# Patient Record
Sex: Male | Born: 1965
Health system: Southern US, Community
[De-identification: ages and names within clinical notes are randomized; demographics above are authoritative.]

---

## 2019-04-14 ENCOUNTER — Inpatient Hospital Stay (HOSPITAL_COMMUNITY)
Admission: EM | Admit: 2019-04-14 | Discharge: 2019-06-25 | DRG: 023 | Disposition: A | Discharge status: Home / Self Care | Payer: Medicaid - Out of State | Attending: Neurology | Admitting: Neurology

## 2019-04-14 ENCOUNTER — Other Ambulatory Visit: Payer: Self-pay

## 2019-04-14 ENCOUNTER — Inpatient Hospital Stay (HOSPITAL_COMMUNITY): Payer: Medicaid - Out of State

## 2019-04-14 ENCOUNTER — Inpatient Hospital Stay (HOSPITAL_COMMUNITY): Payer: Medicaid - Out of State | Admitting: Certified Registered"

## 2019-04-14 ENCOUNTER — Encounter (HOSPITAL_COMMUNITY)
Admission: EM | Disposition: A | Discharge status: Home / Self Care | Payer: Self-pay | Source: Home / Self Care | Attending: Neurology

## 2019-04-14 ENCOUNTER — Emergency Department (HOSPITAL_COMMUNITY): Payer: Medicaid - Out of State

## 2019-04-14 ENCOUNTER — Encounter (HOSPITAL_COMMUNITY): Payer: Self-pay | Admitting: Neurology

## 2019-04-14 DIAGNOSIS — Z20828 Contact with and (suspected) exposure to other viral communicable diseases: Secondary | ICD-10-CM | POA: Diagnosis present

## 2019-04-14 DIAGNOSIS — R29810 Facial weakness: Secondary | ICD-10-CM | POA: Diagnosis present

## 2019-04-14 DIAGNOSIS — E46 Unspecified protein-calorie malnutrition: Secondary | ICD-10-CM | POA: Diagnosis not present

## 2019-04-14 DIAGNOSIS — R471 Dysarthria and anarthria: Secondary | ICD-10-CM | POA: Diagnosis present

## 2019-04-14 DIAGNOSIS — I651 Occlusion and stenosis of basilar artery: Secondary | ICD-10-CM | POA: Diagnosis present

## 2019-04-14 DIAGNOSIS — D6869 Other thrombophilia: Secondary | ICD-10-CM | POA: Diagnosis not present

## 2019-04-14 DIAGNOSIS — E11649 Type 2 diabetes mellitus with hypoglycemia without coma: Secondary | ICD-10-CM | POA: Diagnosis not present

## 2019-04-14 DIAGNOSIS — R29733 NIHSS score 33: Secondary | ICD-10-CM | POA: Diagnosis not present

## 2019-04-14 DIAGNOSIS — F321 Major depressive disorder, single episode, moderate: Secondary | ICD-10-CM | POA: Diagnosis not present

## 2019-04-14 DIAGNOSIS — I639 Cerebral infarction, unspecified: Secondary | ICD-10-CM | POA: Diagnosis not present

## 2019-04-14 DIAGNOSIS — I69391 Dysphagia following cerebral infarction: Secondary | ICD-10-CM

## 2019-04-14 DIAGNOSIS — I63432 Cerebral infarction due to embolism of left posterior cerebral artery: Principal | ICD-10-CM | POA: Diagnosis present

## 2019-04-14 DIAGNOSIS — D509 Iron deficiency anemia, unspecified: Secondary | ICD-10-CM | POA: Diagnosis not present

## 2019-04-14 DIAGNOSIS — E785 Hyperlipidemia, unspecified: Secondary | ICD-10-CM | POA: Diagnosis present

## 2019-04-14 DIAGNOSIS — R4701 Aphasia: Secondary | ICD-10-CM

## 2019-04-14 DIAGNOSIS — R29713 NIHSS score 13: Secondary | ICD-10-CM | POA: Diagnosis present

## 2019-04-14 DIAGNOSIS — R449 Unspecified symptoms and signs involving general sensations and perceptions: Secondary | ICD-10-CM | POA: Diagnosis present

## 2019-04-14 DIAGNOSIS — H499 Unspecified paralytic strabismus: Secondary | ICD-10-CM | POA: Diagnosis present

## 2019-04-14 DIAGNOSIS — E119 Type 2 diabetes mellitus without complications: Secondary | ICD-10-CM

## 2019-04-14 DIAGNOSIS — Z751 Person awaiting admission to adequate facility elsewhere: Secondary | ICD-10-CM

## 2019-04-14 DIAGNOSIS — G8191 Hemiplegia, unspecified affecting right dominant side: Secondary | ICD-10-CM | POA: Diagnosis present

## 2019-04-14 DIAGNOSIS — J9601 Acute respiratory failure with hypoxia: Secondary | ICD-10-CM

## 2019-04-14 DIAGNOSIS — H532 Diplopia: Secondary | ICD-10-CM | POA: Diagnosis present

## 2019-04-14 DIAGNOSIS — R569 Unspecified convulsions: Secondary | ICD-10-CM | POA: Diagnosis not present

## 2019-04-14 DIAGNOSIS — H5500 Unspecified nystagmus: Secondary | ICD-10-CM | POA: Diagnosis present

## 2019-04-14 DIAGNOSIS — H5340 Unspecified visual field defects: Secondary | ICD-10-CM | POA: Diagnosis present

## 2019-04-14 DIAGNOSIS — B952 Enterococcus as the cause of diseases classified elsewhere: Secondary | ICD-10-CM | POA: Diagnosis not present

## 2019-04-14 DIAGNOSIS — R739 Hyperglycemia, unspecified: Secondary | ICD-10-CM

## 2019-04-14 DIAGNOSIS — R633 Feeding difficulties: Secondary | ICD-10-CM

## 2019-04-14 DIAGNOSIS — Z4659 Encounter for fitting and adjustment of other gastrointestinal appliance and device: Secondary | ICD-10-CM

## 2019-04-14 DIAGNOSIS — R6339 Other feeding difficulties: Secondary | ICD-10-CM

## 2019-04-14 DIAGNOSIS — I10 Essential (primary) hypertension: Secondary | ICD-10-CM | POA: Diagnosis present

## 2019-04-14 DIAGNOSIS — R143 Flatulence: Secondary | ICD-10-CM

## 2019-04-14 DIAGNOSIS — N390 Urinary tract infection, site not specified: Secondary | ICD-10-CM | POA: Diagnosis not present

## 2019-04-14 DIAGNOSIS — I6622 Occlusion and stenosis of left posterior cerebral artery: Secondary | ICD-10-CM | POA: Diagnosis present

## 2019-04-14 DIAGNOSIS — E1165 Type 2 diabetes mellitus with hyperglycemia: Secondary | ICD-10-CM | POA: Diagnosis present

## 2019-04-14 DIAGNOSIS — B957 Other staphylococcus as the cause of diseases classified elsewhere: Secondary | ICD-10-CM | POA: Diagnosis not present

## 2019-04-14 DIAGNOSIS — Z8249 Family history of ischemic heart disease and other diseases of the circulatory system: Secondary | ICD-10-CM

## 2019-04-14 DIAGNOSIS — R200 Anesthesia of skin: Secondary | ICD-10-CM | POA: Diagnosis present

## 2019-04-14 DIAGNOSIS — G9340 Encephalopathy, unspecified: Secondary | ICD-10-CM

## 2019-04-14 DIAGNOSIS — G9349 Other encephalopathy: Secondary | ICD-10-CM | POA: Diagnosis present

## 2019-04-14 DIAGNOSIS — R1310 Dysphagia, unspecified: Secondary | ICD-10-CM | POA: Diagnosis present

## 2019-04-14 DIAGNOSIS — R509 Fever, unspecified: Secondary | ICD-10-CM

## 2019-04-14 DIAGNOSIS — Z9289 Personal history of other medical treatment: Secondary | ICD-10-CM

## 2019-04-14 DIAGNOSIS — G529 Cranial nerve disorder, unspecified: Secondary | ICD-10-CM | POA: Diagnosis present

## 2019-04-14 HISTORY — PX: IR ANGIO EXTRACRAN SEL COM CAROTID INNOMINATE UNI R MOD SED: IMG5356

## 2019-04-14 HISTORY — PX: IR ANGIO INTRA EXTRACRAN SEL COM CAROTID INNOMINATE UNI L MOD SED: IMG5358

## 2019-04-14 HISTORY — PX: IR CT HEAD LTD: IMG2386

## 2019-04-14 HISTORY — PX: IR PERCUTANEOUS ART THROMBECTOMY/INFUSION INTRACRANIAL INC DIAG ANGIO: IMG6087

## 2019-04-14 HISTORY — PX: IR ANGIO VERTEBRAL SEL VERTEBRAL UNI L MOD SED: IMG5367

## 2019-04-14 HISTORY — PX: RADIOLOGY WITH ANESTHESIA: SHX6223

## 2019-04-14 LAB — CBC
HCT: 40.7 % (ref 39.0–52.0)
Hemoglobin: 12.7 g/dL — ABNORMAL LOW (ref 13.0–17.0)
MCH: 22.8 pg — ABNORMAL LOW (ref 26.0–34.0)
MCHC: 31.2 g/dL (ref 30.0–36.0)
MCV: 72.9 fL — ABNORMAL LOW (ref 80.0–100.0)
Platelets: 249 10*3/uL (ref 150–400)
RBC: 5.58 MIL/uL (ref 4.22–5.81)
RDW: 14.1 % (ref 11.5–15.5)
WBC: 6.4 10*3/uL (ref 4.0–10.5)
nRBC: 0 % (ref 0.0–0.2)

## 2019-04-14 LAB — COMPREHENSIVE METABOLIC PANEL
ALT: 23 U/L (ref 0–44)
AST: 23 U/L (ref 15–41)
Albumin: 3.5 g/dL (ref 3.5–5.0)
Alkaline Phosphatase: 63 U/L (ref 38–126)
Anion gap: 13 (ref 5–15)
BUN: 11 mg/dL (ref 6–20)
CO2: 23 mmol/L (ref 22–32)
Calcium: 9.2 mg/dL (ref 8.9–10.3)
Chloride: 98 mmol/L (ref 98–111)
Creatinine, Ser: 0.92 mg/dL (ref 0.61–1.24)
GFR calc Af Amer: >60 mL/min (ref >60)
GFR calc non Af Amer: >60 mL/min (ref >60)
Glucose, Bld: 342 mg/dL — ABNORMAL HIGH (ref 70–99)
Potassium: 3.7 mmol/L (ref 3.5–5.1)
Sodium: 134 mmol/L — ABNORMAL LOW (ref 135–145)
Total Bilirubin: 0.8 mg/dL (ref 0.3–1.2)
Total Protein: 6.9 g/dL (ref 6.5–8.1)

## 2019-04-14 LAB — POCT I-STAT 7, (LYTES, BLD GAS, ICA,H+H)
Acid-base deficit: 4 mmol/L — ABNORMAL HIGH (ref 0.0–2.0)
Bicarbonate: 21.3 mmol/L (ref 20.0–28.0)
Calcium, Ion: 1.18 mmol/L (ref 1.15–1.40)
HCT: 38 % — ABNORMAL LOW (ref 39.0–52.0)
Hemoglobin: 12.9 g/dL — ABNORMAL LOW (ref 13.0–17.0)
O2 Saturation: 98 %
Patient temperature: 98.6
Potassium: 3.7 mmol/L (ref 3.5–5.1)
Sodium: 137 mmol/L (ref 135–145)
TCO2: 22 mmol/L (ref 22–32)
pCO2 arterial: 37.3 mmHg (ref 32.0–48.0)
pH, Arterial: 7.364 (ref 7.350–7.450)
pO2, Arterial: 114 mmHg — ABNORMAL HIGH (ref 83.0–108.0)

## 2019-04-14 LAB — I-STAT CHEM 8, ED
BUN: 12 mg/dL (ref 6–20)
Calcium, Ion: 1.11 mmol/L — ABNORMAL LOW (ref 1.15–1.40)
Chloride: 97 mmol/L — ABNORMAL LOW (ref 98–111)
Creatinine, Ser: 0.7 mg/dL (ref 0.61–1.24)
Glucose, Bld: 327 mg/dL — ABNORMAL HIGH (ref 70–99)
HCT: 43 % (ref 39.0–52.0)
Hemoglobin: 14.6 g/dL (ref 13.0–17.0)
Potassium: 3.7 mmol/L (ref 3.5–5.1)
Sodium: 135 mmol/L (ref 135–145)
TCO2: 21 mmol/L — ABNORMAL LOW (ref 22–32)

## 2019-04-14 LAB — DIFFERENTIAL
Abs Immature Granulocytes: 0.01 10*3/uL (ref 0.00–0.07)
Basophils Absolute: 0 10*3/uL (ref 0.0–0.1)
Basophils Relative: 1 %
Eosinophils Absolute: 0.1 10*3/uL (ref 0.0–0.5)
Eosinophils Relative: 1 %
Immature Granulocytes: 0 %
Lymphocytes Relative: 19 %
Lymphs Abs: 1.2 10*3/uL (ref 0.7–4.0)
Monocytes Absolute: 0.4 10*3/uL (ref 0.1–1.0)
Monocytes Relative: 6 %
Neutro Abs: 4.7 10*3/uL (ref 1.7–7.7)
Neutrophils Relative %: 73 %

## 2019-04-14 LAB — GLUCOSE, CAPILLARY
Glucose-Capillary: 143 mg/dL — ABNORMAL HIGH (ref 70–99)
Glucose-Capillary: 193 mg/dL — ABNORMAL HIGH (ref 70–99)
Glucose-Capillary: 205 mg/dL — ABNORMAL HIGH (ref 70–99)
Glucose-Capillary: 230 mg/dL — ABNORMAL HIGH (ref 70–99)
Glucose-Capillary: 244 mg/dL — ABNORMAL HIGH (ref 70–99)
Glucose-Capillary: 251 mg/dL — ABNORMAL HIGH (ref 70–99)
Glucose-Capillary: 268 mg/dL — ABNORMAL HIGH (ref 70–99)
Glucose-Capillary: 322 mg/dL — ABNORMAL HIGH (ref 70–99)

## 2019-04-14 LAB — CBG MONITORING, ED: Glucose-Capillary: 336 mg/dL — ABNORMAL HIGH (ref 70–99)

## 2019-04-14 LAB — APTT: aPTT: 27 seconds (ref 24–36)

## 2019-04-14 LAB — HEMOGLOBIN A1C
Hgb A1c MFr Bld: 12.3 % — ABNORMAL HIGH (ref 4.8–5.6)
Mean Plasma Glucose: 306.31 mg/dL

## 2019-04-14 LAB — PROTIME-INR
INR: 1 (ref 0.8–1.2)
Prothrombin Time: 13.3 seconds (ref 11.4–15.2)

## 2019-04-14 LAB — MRSA PCR SCREENING: MRSA by PCR: NEGATIVE

## 2019-04-14 LAB — SARS CORONAVIRUS 2 BY RT PCR (HOSPITAL ORDER, PERFORMED IN ~~LOC~~ HOSPITAL LAB): SARS Coronavirus 2: NEGATIVE

## 2019-04-14 SURGERY — IR WITH ANESTHESIA
Anesthesia: General

## 2019-04-14 MED ORDER — IOHEXOL 300 MG/ML  SOLN
150.0000 mL | Freq: Once | INTRAMUSCULAR | Status: AC | PRN
  Administered 2019-04-14: 13:00:00 80 mL via INTRA_ARTERIAL

## 2019-04-14 MED ORDER — FENTANYL CITRATE (PF) 100 MCG/2ML IJ SOLN
INTRAMUSCULAR | Status: AC
  Filled 2019-04-14: qty 2

## 2019-04-14 MED ORDER — SUCCINYLCHOLINE CHLORIDE 20 MG/ML IJ SOLN
INTRAMUSCULAR | Status: DC | PRN
  Administered 2019-04-14: 120 mg via INTRAVENOUS

## 2019-04-14 MED ORDER — SODIUM CHLORIDE 0.9 % IV SOLN
INTRAVENOUS | Status: DC | PRN
  Administered 2019-04-14: 500 mL via INTRAVENOUS

## 2019-04-14 MED ORDER — INSULIN REGULAR(HUMAN) IN NACL 100-0.9 UT/100ML-% IV SOLN
INTRAVENOUS | Status: DC
  Administered 2019-04-14: 17:00:00 2.6 [IU]/h via INTRAVENOUS
  Filled 2019-04-14: qty 100

## 2019-04-14 MED ORDER — ACETAMINOPHEN 325 MG PO TABS
650.0000 mg | ORAL_TABLET | ORAL | Status: DC | PRN
  Administered 2019-04-18 – 2019-05-26 (×5): 650 mg via ORAL
  Filled 2019-04-14 (×10): qty 2

## 2019-04-14 MED ORDER — INSULIN ASPART 100 UNIT/ML ~~LOC~~ SOLN: 0.0000 [IU] | SUBCUTANEOUS | Status: DC

## 2019-04-14 MED ORDER — FENTANYL CITRATE (PF) 100 MCG/2ML IJ SOLN: 50.0000 ug | INTRAMUSCULAR | Status: DC | PRN

## 2019-04-14 MED ORDER — ACETAMINOPHEN 650 MG RE SUPP
650.0000 mg | RECTAL | Status: DC | PRN
  Administered 2019-04-16 – 2019-04-18 (×4): 650 mg via RECTAL
  Filled 2019-04-14 (×4): qty 1

## 2019-04-14 MED ORDER — DEXAMETHASONE SODIUM PHOSPHATE 10 MG/ML IJ SOLN
INTRAMUSCULAR | Status: DC | PRN
  Administered 2019-04-14: 10 mg via INTRAVENOUS

## 2019-04-14 MED ORDER — CHLORHEXIDINE GLUCONATE 0.12% ORAL RINSE (MEDLINE KIT): 15.0000 mL | Freq: Two times a day (BID) | OROMUCOSAL | Status: DC

## 2019-04-14 MED ORDER — ACETAMINOPHEN 325 MG PO TABS: 650.0000 mg | ORAL_TABLET | ORAL | Status: DC | PRN

## 2019-04-14 MED ORDER — CHLORHEXIDINE GLUCONATE CLOTH 2 % EX PADS
6.0000 | MEDICATED_PAD | Freq: Every day | CUTANEOUS | Status: DC
  Administered 2019-04-15 – 2019-05-02 (×15): 6 via TOPICAL

## 2019-04-14 MED ORDER — NITROGLYCERIN 1 MG/10 ML FOR IR/CATH LAB
INTRA_ARTERIAL | Status: AC
  Filled 2019-04-14: qty 10

## 2019-04-14 MED ORDER — SODIUM CHLORIDE 0.9% FLUSH: 3.0000 mL | Freq: Once | INTRAVENOUS | Status: AC

## 2019-04-14 MED ORDER — LIDOCAINE HCL (CARDIAC) PF 100 MG/5ML IV SOSY
PREFILLED_SYRINGE | INTRAVENOUS | Status: DC | PRN
  Administered 2019-04-14: 60 mg via INTRAVENOUS

## 2019-04-14 MED ORDER — PROPOFOL 500 MG/50ML IV EMUL
INTRAVENOUS | Status: DC | PRN
  Administered 2019-04-14: 50 ug/kg/min via INTRAVENOUS

## 2019-04-14 MED ORDER — CLEVIDIPINE BUTYRATE 0.5 MG/ML IV EMUL
INTRAVENOUS | Status: DC | PRN
  Administered 2019-04-14: 4 mg/h via INTRAVENOUS

## 2019-04-14 MED ORDER — ACETAMINOPHEN 160 MG/5ML PO SOLN
650.0000 mg | ORAL | Status: DC | PRN
  Administered 2019-04-15: 650 mg
  Filled 2019-04-14: qty 20.3

## 2019-04-14 MED ORDER — EPTIFIBATIDE 20 MG/10ML IV SOLN
INTRAVENOUS | Status: AC
  Filled 2019-04-14: qty 10

## 2019-04-14 MED ORDER — ORAL CARE MOUTH RINSE
15.0000 mL | OROMUCOSAL | Status: DC
  Administered 2019-04-14 (×2): 15 mL via OROMUCOSAL

## 2019-04-14 MED ORDER — ASPIRIN 81 MG PO CHEW
CHEWABLE_TABLET | ORAL | Status: AC
  Filled 2019-04-14: qty 1

## 2019-04-14 MED ORDER — FENTANYL CITRATE (PF) 100 MCG/2ML IJ SOLN
INTRAMUSCULAR | Status: DC | PRN
  Administered 2019-04-14: 100 ug via INTRAVENOUS

## 2019-04-14 MED ORDER — ONDANSETRON HCL 4 MG/2ML IJ SOLN
INTRAMUSCULAR | Status: DC | PRN
  Administered 2019-04-14: 4 mg via INTRAVENOUS

## 2019-04-14 MED ORDER — IOHEXOL 300 MG/ML  SOLN: 150.0000 mL | Freq: Once | INTRAMUSCULAR | Status: DC | PRN

## 2019-04-14 MED ORDER — STROKE: EARLY STAGES OF RECOVERY BOOK
Freq: Once | Status: AC
  Administered 2019-04-14: 17:00:00
  Filled 2019-04-14: qty 1

## 2019-04-14 MED ORDER — SODIUM CHLORIDE 0.9 % IV SOLN
INTRAVENOUS | Status: DC
  Administered 2019-04-14 – 2019-05-08 (×23): via INTRAVENOUS

## 2019-04-14 MED ORDER — SENNOSIDES-DOCUSATE SODIUM 8.6-50 MG PO TABS: 1.0000 | ORAL_TABLET | Freq: Every evening | ORAL | Status: DC | PRN

## 2019-04-14 MED ORDER — ACETAMINOPHEN 650 MG RE SUPP: 650.0000 mg | RECTAL | Status: DC | PRN

## 2019-04-14 MED ORDER — CHLORHEXIDINE GLUCONATE CLOTH 2 % EX PADS
6.0000 | MEDICATED_PAD | Freq: Every day | CUTANEOUS | Status: DC
  Administered 2019-04-14: 6 via TOPICAL

## 2019-04-14 MED ORDER — INSULIN ASPART 100 UNIT/ML ~~LOC~~ SOLN: 2.0000 [IU] | SUBCUTANEOUS | Status: DC

## 2019-04-14 MED ORDER — CEFAZOLIN SODIUM-DEXTROSE 2-3 GM-%(50ML) IV SOLR
INTRAVENOUS | Status: DC | PRN
  Administered 2019-04-14: 2 g via INTRAVENOUS

## 2019-04-14 MED ORDER — PROPOFOL 1000 MG/100ML IV EMUL: 0.0000 ug/kg/min | INTRAVENOUS | Status: DC

## 2019-04-14 MED ORDER — ORAL CARE MOUTH RINSE
15.0000 mL | OROMUCOSAL | Status: DC
  Administered 2019-04-14 – 2019-04-16 (×16): 15 mL via OROMUCOSAL

## 2019-04-14 MED ORDER — PROPOFOL 1000 MG/100ML IV EMUL
0.0000 ug/kg/min | INTRAVENOUS | Status: DC
  Administered 2019-04-14: 70 ug/kg/min via INTRAVENOUS
  Administered 2019-04-14: 50 ug/kg/min via INTRAVENOUS
  Administered 2019-04-15: 20 ug/kg/min via INTRAVENOUS
  Filled 2019-04-14 (×5): qty 100

## 2019-04-14 MED ORDER — CHLORHEXIDINE GLUCONATE 0.12% ORAL RINSE (MEDLINE KIT)
15.0000 mL | Freq: Two times a day (BID) | OROMUCOSAL | Status: DC
  Administered 2019-04-14 – 2019-04-16 (×4): 15 mL via OROMUCOSAL

## 2019-04-14 MED ORDER — CLEVIDIPINE BUTYRATE 0.5 MG/ML IV EMUL
INTRAVENOUS | Status: AC
  Filled 2019-04-14: qty 50

## 2019-04-14 MED ORDER — TIROFIBAN HCL IN NACL 5-0.9 MG/100ML-% IV SOLN
INTRAVENOUS | Status: AC
  Filled 2019-04-14: qty 100

## 2019-04-14 MED ORDER — ROCURONIUM BROMIDE 50 MG/5ML IV SOSY
PREFILLED_SYRINGE | INTRAVENOUS | Status: DC | PRN
  Administered 2019-04-14: 50 mg via INTRAVENOUS
  Administered 2019-04-14: 60 mg via INTRAVENOUS

## 2019-04-14 MED ORDER — SODIUM CHLORIDE 0.9 % IV SOLN: INTRAVENOUS | Status: DC

## 2019-04-14 MED ORDER — IOHEXOL 300 MG/ML  SOLN
150.0000 mL | Freq: Once | INTRAMUSCULAR | Status: AC | PRN
  Administered 2019-04-14: 14:00:00 20 mL via INTRA_ARTERIAL

## 2019-04-14 MED ORDER — TICAGRELOR 90 MG PO TABS
ORAL_TABLET | ORAL | Status: AC
  Filled 2019-04-14: qty 2

## 2019-04-14 MED ORDER — FENTANYL CITRATE (PF) 100 MCG/2ML IJ SOLN
50.0000 ug | INTRAMUSCULAR | Status: DC | PRN
  Administered 2019-04-14: 100 ug via INTRAVENOUS
  Administered 2019-04-14: 200 ug via INTRAVENOUS
  Administered 2019-04-14 – 2019-04-15 (×3): 100 ug via INTRAVENOUS
  Filled 2019-04-14 (×4): qty 2
  Filled 2019-04-14: qty 4

## 2019-04-14 MED ORDER — ACETAMINOPHEN 160 MG/5ML PO SOLN: 650.0000 mg | ORAL | Status: DC | PRN

## 2019-04-14 MED ORDER — SUGAMMADEX SODIUM 200 MG/2ML IV SOLN
INTRAVENOUS | Status: DC | PRN
  Administered 2019-04-14: 200 mg via INTRAVENOUS

## 2019-04-14 MED ORDER — SODIUM CHLORIDE (PF) 0.9 % IJ SOLN
INTRAVENOUS | Status: AC | PRN
  Administered 2019-04-14 (×2): 25 ug via INTRA_ARTERIAL

## 2019-04-14 MED ORDER — ESMOLOL HCL 100 MG/10ML IV SOLN
INTRAVENOUS | Status: DC | PRN
  Administered 2019-04-14 (×4): 20 mg via INTRAVENOUS

## 2019-04-14 MED ORDER — PANTOPRAZOLE SODIUM 40 MG IV SOLR
40.0000 mg | INTRAVENOUS | Status: DC
  Administered 2019-04-14 – 2019-04-20 (×7): 40 mg via INTRAVENOUS
  Filled 2019-04-14 (×7): qty 40

## 2019-04-14 MED ORDER — POTASSIUM CHLORIDE 20 MEQ/15ML (10%) PO SOLN
40.0000 meq | Freq: Three times a day (TID) | ORAL | Status: AC
  Administered 2019-04-14 (×2): 40 meq
  Filled 2019-04-14 (×2): qty 30

## 2019-04-14 MED ORDER — CLEVIDIPINE BUTYRATE 0.5 MG/ML IV EMUL
0.0000 mg/h | INTRAVENOUS | Status: AC
  Administered 2019-04-14: 20 mg/h via INTRAVENOUS
  Administered 2019-04-14: 19:00:00 11 mg/h via INTRAVENOUS
  Administered 2019-04-14: 22:00:00 24 mg/h via INTRAVENOUS
  Administered 2019-04-14: 21 mg/h via INTRAVENOUS
  Administered 2019-04-14: 18 mg/h via INTRAVENOUS
  Filled 2019-04-14: qty 100
  Filled 2019-04-14 (×4): qty 50

## 2019-04-14 MED ORDER — CEFAZOLIN SODIUM-DEXTROSE 2-4 GM/100ML-% IV SOLN
INTRAVENOUS | Status: AC
  Filled 2019-04-14: qty 100

## 2019-04-14 MED ORDER — PROPOFOL 10 MG/ML IV BOLUS
INTRAVENOUS | Status: DC | PRN
  Administered 2019-04-14: 50 mg via INTRAVENOUS
  Administered 2019-04-14: 40 mg via INTRAVENOUS
  Administered 2019-04-14 (×3): 50 mg via INTRAVENOUS
  Administered 2019-04-14: 160 mg via INTRAVENOUS

## 2019-04-14 MED ORDER — IOHEXOL 350 MG/ML SOLN
100.0000 mL | Freq: Once | INTRAVENOUS | Status: AC | PRN
  Administered 2019-04-14: 100 mL via INTRAVENOUS

## 2019-04-14 MED ORDER — LEVETIRACETAM IN NACL 1500 MG/100ML IV SOLN
1500.0000 mg | INTRAVENOUS | Status: AC
  Administered 2019-04-14: 21:00:00 1500 mg via INTRAVENOUS
  Filled 2019-04-14: qty 100

## 2019-04-14 MED ORDER — CLOPIDOGREL BISULFATE 300 MG PO TABS
ORAL_TABLET | ORAL | Status: AC
  Filled 2019-04-14: qty 1

## 2019-04-14 MED ORDER — LACTATED RINGERS IV SOLN: INTRAVENOUS | Status: DC | PRN

## 2019-04-14 NOTE — Sedation Documentation (Addendum)
Pt not waking up, per anesthesia.  Dr Sallyanne Havers at bedside to assess.  Pt to remain intubated.  Pt chemically sedated and paralyzed per anesthesia

## 2019-04-14 NOTE — Progress Notes (Signed)
Notified Dr. Estanislado Pandy that pt's BP is elevated when pt is stimulated, regardless of Cleviprex at max rate. Dr. Estanislado Pandy came to pt bedside and confirmed new BP parameters of SBP 120-150's; also approved use of sedation to keep patient relaxed and BP within parameters.

## 2019-04-14 NOTE — Consult Note (Signed)
INR. Post procedure patient left intubated to protect airway as patient . CT brain revealed no ICH. RT CFA access site sealed with an 47F angioseal. Distal pulses doppleable DPs and PTs bilaterally unchanged. S.Yair Dusza MD

## 2019-04-14 NOTE — Progress Notes (Addendum)
Full body rigid shaking observed, left greater than right. No blink to threat during episodes. Dr. Rory Percy notified, came to bedside to assess. Stat Keppra ordered and administered.  SBP as high as 250s during episodes, HR 120s. Returns to baseline several minutes following episodes.

## 2019-04-14 NOTE — ED Triage Notes (Signed)
Patient in via [place] as Code Stroke - LKW 2100 last night when patient noticed headache. He has slurred speech and R sided weakness, which he told EMS began about midnight this morning - hard to understand, but he states he couldn't sleep so was walking around and ended up sleeping in his car overnight because he is in the process of moving down here from [place]. R arm and leg weakness, R facial droop, double vision, dysarthria, decreased sensation to R side. A&O x 4.   Denies significant medical history, medications, or allergies.

## 2019-04-14 NOTE — Progress Notes (Signed)
SLP Cancellation Note  Patient Details Name: Rodney Collier MRN: [id number] DOB: [birth year]   Cancelled treatment:       Reason Eval/Treat Not Completed: Medical issues which prohibited therapy(Per medical record, pt has been left intubated after IR procedure. SLP will follow up)  Manual [place] I. Hardin Negus, [place], [place] Office number [phone] Pager [place] 04/14/2019, 3:33 PM
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
## 2019-04-14 NOTE — Anesthesia Procedure Notes (Addendum)
Procedure Name: Intubation Date/Time: 04/14/2019 11:57 AM Performed by: Lance Coon, CRNA Pre-anesthesia Checklist: Patient identified, Emergency Drugs available, Suction available, Patient being monitored and Timeout performed Patient Re-evaluated:Patient Re-evaluated prior to induction Oxygen Delivery Method: Ambu bag Preoxygenation: Pre-oxygenation with 100% oxygen Induction Type: IV induction, Rapid sequence and Cricoid Pressure applied Laryngoscope Size: McGraph and 3 Grade View: Grade I Tube type: Oral Tube size: 7.5 mm Number of attempts: 1 Airway Equipment and Method: Stylet and Video-laryngoscopy Placement Confirmation: ETT inserted through vocal cords under direct vision,  positive ETCO2 and breath sounds checked- equal and bilateral Secured at: 22 cm Tube secured with: Tape Dental Injury: Teeth and Oropharynx as per pre-operative assessment

## 2019-04-14 NOTE — Progress Notes (Signed)
Patient assessed alongside Dr. Estanislado Pandy this afternoon s/p cerebral intervention for acute stroke.   Patient remains intubated.  Requiring sedation for agitation.  BP controlled with sedation as well as Cleviprex.   Moves R arm spontaneously during visit but does not follow commands to move other extremities.   Groin intact.  Distal pulses intact bilaterally.   Brynda Greathouse, MS RD PA-C

## 2019-04-14 NOTE — ED Notes (Signed)
Patient being transported to IR at this time.
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
## 2019-04-14 NOTE — Progress Notes (Signed)
Called for concern for sz intermittently Will load keppra EEE in the AM. Will continue to follow.  -- Amie Portland, MD [facility] Pager: [phone] If 7pm to 7am, please call on call as listed on AMION.

## 2019-04-14 NOTE — ED Notes (Signed)
IR and respiratory at bedside to intubate.

## 2019-04-14 NOTE — ED Notes (Signed)
Patient states he has a daughter, Rodney Collier? But doesn't know her phone number by heart and states it is in his phone, however phone is not at the bedside and he thinks it was left in his car.

## 2019-04-14 NOTE — Consult Note (Signed)
Chief Complaint: Patient was seen in consultation today for CVA  Referring Physician(s): Dr. Otelia Limes  Supervising Physician: Julieanne Cotton  Patient Status: [facility] - ED  History of Present Illness: Rodney Collier is a 53 y.o. male with no known past medical history, recent relocation from [place] who prsented to [facility] ED with right sided weakness.  Patient was last known well around midnight. He was driving to [place] when he began noticing weakness and dysarthria.  He pulled over and attempted to sleep but could not.  Attempted to walk outside of car and had trouble due to right-sided weakness.  A bystander reportedly called EMS.   CT Perfusion study limited, however shows: IMPRESSION: 1. Left P2 occlusion with downstream reconstitution. Qualitative assessment of CT perfusion maps shows ischemia without visible core infarct in the left occipital lobe. 2. Mild atherosclerosis in the neck without flow limiting stenosis or embolic source seen. 3. No large vessel occlusion in the anterior circulation. 4. Mild mid basilar narrowing.  Patient assessed at bedside.  Neuro exam worsening with increased right-sided weakness.  Dysarthric but able to clearly answer questions.  Demonstrates orientation and ability to understand care plan.    No past medical history on file.   Allergies: Patient has no known allergies.  Medications: Prior to Admission medications   Not on File     Family History  Problem Relation Age of Onset   Hypertension Mother    Hypertension Father     Social History   Socioeconomic History   Marital status: Not on file    Spouse name: Not on file   Number of children: Not on file   Years of education: Not on file   Highest education level: Not on file  Occupational History   Not on file  Social Needs   Financial resource strain: Not on file   Food insecurity    Worry: Not on file    Inability: Not on file   Transportation needs      Medical: Not on file    Non-medical: Not on file  Tobacco Use   Smoking status: Not on file  Substance and Sexual Activity   Alcohol use: Not on file   Drug use: Not on file   Sexual activity: Not on file  Lifestyle   Physical activity    Days per week: Not on file    Minutes per session: Not on file   Stress: Not on file  Relationships   Social connections    Talks on phone: Not on file    Gets together: Not on file    Attends religious service: Not on file    Active member of club or organization: Not on file    Attends meetings of clubs or organizations: Not on file    Relationship status: Not on file  Other Topics Concern   Not on file  Social History Narrative   Not on file     Review of Systems: A 12 point ROS discussed and pertinent positives are indicated in the HPI above.  All other systems are negative.  Review of Systems  Unable to perform ROS: Acuity of condition    Vital Signs: BP (!) 147/91    Pulse 81    Temp 98.1 F (36.7 C) (Oral)    Resp 18    SpO2 97%   Physical Exam Vitals signs and nursing note reviewed.  HENT:     Head: Normocephalic and atraumatic.     Mouth/Throat:  Mouth: Mucous membranes are moist.     Pharynx: Oropharynx is clear.  Cardiovascular:     Rate and Rhythm: Normal rate and regular rhythm.     Pulses: Normal pulses.     Comments: R radial pulse 2+ L radial pulse 2+, L foremarm IV R DP 2+ L DP 2+ Pulmonary:     Effort: Pulmonary effort is normal. No respiratory distress.     Breath sounds: Normal breath sounds.  Skin:    General: Skin is warm and dry.  Neurological:     Mental Status: He is alert and oriented to person, place, and time.     Comments: L eye nystagmus, R tongue deviation, R facial droop, dysarthria, diplopia, flaccid right-sided upper and lower extremity weakness  Psychiatric:        Thought Content: Thought content normal.        Judgment: Judgment normal.      MD Evaluation Airway:  WNL Heart: WNL Abdomen: WNL Chest/ Lungs: WNL ASA  Classification: 3 Mallampati/Airway Score: One   Imaging: Ct Code Stroke Cta Head W/wo Contrast  Result Date: 04/14/2019 CLINICAL DATA:  Right-sided deficits EXAM: CT ANGIOGRAPHY HEAD AND NECK CT PERFUSION BRAIN TECHNIQUE: Multidetector CT imaging of the head and neck was performed using the standard protocol during bolus administration of intravenous contrast. Multiplanar CT image reconstructions and MIPs were obtained to evaluate the vascular anatomy. Carotid stenosis measurements (when applicable) are obtained utilizing NASCET criteria, using the distal internal carotid diameter as the denominator. Multiphase CT imaging of the brain was performed following IV bolus contrast injection. Subsequent parametric perfusion maps were calculated using RAPID software. CONTRAST:  Dose is currently not available COMPARISON:  None. FINDINGS: CTA NECK FINDINGS Aortic arch: 2 vessel branching.  No acute finding Right carotid system: Minimal calcified plaque at the bifurcation/bulb. No ulceration or stenosis. Left carotid system: Probable, minimal low-density plaque at the bulb. No stenosis or ulceration. Vertebral arteries: No proximal subclavian stenosis. Borderline left V1 segment narrowing is favored artifactual. Vessels are widely patent to the dura. Skeleton: No acute or aggressive finding. Poor dentition with bilateral maxillary and right mandibular periapical erosions. Other neck: No acute finding Upper chest: No acute finding Review of the MIP images confirms the above findings CTA HEAD FINDINGS Anterior circulation: No major branch occlusion. There is atherosclerotic irregularity of bilateral MCA branches. Dominant right A1 segment Posterior circulation: Mild narrowing of the mid basilar. The left PCA is not seen beyond the P2 segment with subsequent reconstitution. Negative for aneurysm. Venous sinuses: Patent Anatomic variants: None significant Review of  the MIP images confirms the above findings Case discussed in progress with Dr. Cheral Marker CT Brain Perfusion Findings: RAPID software has failed such that perfusion maps were generated on TeraRecon software by myself. Images were sent to PACS. There is prolonged mean transit time within the left occipital lobe with relatively preserved blood volume. IMPRESSION: 1. Left P2 occlusion with downstream reconstitution. Qualitative assessment of CT perfusion maps shows ischemia without visible core infarct in the left occipital lobe. 2. Mild atherosclerosis in the neck without flow limiting stenosis or embolic source seen. 3. No large vessel occlusion in the anterior circulation. 4. Mild mid basilar narrowing. Electronically Signed   By: Monte Fantasia M.D.   On: 04/14/2019 11:02   Ct Code Stroke Cta Neck W/wo Contrast  Result Date: 04/14/2019 CLINICAL DATA:  Right-sided deficits EXAM: CT ANGIOGRAPHY HEAD AND NECK CT PERFUSION BRAIN TECHNIQUE: Multidetector CT imaging of the head and neck was  performed using the standard protocol during bolus administration of intravenous contrast. Multiplanar CT image reconstructions and MIPs were obtained to evaluate the vascular anatomy. Carotid stenosis measurements (when applicable) are obtained utilizing NASCET criteria, using the distal internal carotid diameter as the denominator. Multiphase CT imaging of the brain was performed following IV bolus contrast injection. Subsequent parametric perfusion maps were calculated using RAPID software. CONTRAST:  Dose is currently not available COMPARISON:  None. FINDINGS: CTA NECK FINDINGS Aortic arch: 2 vessel branching.  No acute finding Right carotid system: Minimal calcified plaque at the bifurcation/bulb. No ulceration or stenosis. Left carotid system: Probable, minimal low-density plaque at the bulb. No stenosis or ulceration. Vertebral arteries: No proximal subclavian stenosis. Borderline left V1 segment narrowing is favored  artifactual. Vessels are widely patent to the dura. Skeleton: No acute or aggressive finding. Poor dentition with bilateral maxillary and right mandibular periapical erosions. Other neck: No acute finding Upper chest: No acute finding Review of the MIP images confirms the above findings CTA HEAD FINDINGS Anterior circulation: No major branch occlusion. There is atherosclerotic irregularity of bilateral MCA branches. Dominant right A1 segment Posterior circulation: Mild narrowing of the mid basilar. The left PCA is not seen beyond the P2 segment with subsequent reconstitution. Negative for aneurysm. Venous sinuses: Patent Anatomic variants: None significant Review of the MIP images confirms the above findings Case discussed in progress with Dr. Otelia LimesLindzen CT Brain Perfusion Findings: RAPID software has failed such that perfusion maps were generated on TeraRecon software by myself. Images were sent to PACS. There is prolonged mean transit time within the left occipital lobe with relatively preserved blood volume. IMPRESSION: 1. Left P2 occlusion with downstream reconstitution. Qualitative assessment of CT perfusion maps shows ischemia without visible core infarct in the left occipital lobe. 2. Mild atherosclerosis in the neck without flow limiting stenosis or embolic source seen. 3. No large vessel occlusion in the anterior circulation. 4. Mild mid basilar narrowing. Electronically Signed   By: Marnee SpringJonathon  Watts M.D.   On: 04/14/2019 11:02   Ct Code Stroke Cta Cerebral Perfusion W/wo Contrast  Result Date: 04/14/2019 CLINICAL DATA:  Right-sided deficits EXAM: CT ANGIOGRAPHY HEAD AND NECK CT PERFUSION BRAIN TECHNIQUE: Multidetector CT imaging of the head and neck was performed using the standard protocol during bolus administration of intravenous contrast. Multiplanar CT image reconstructions and MIPs were obtained to evaluate the vascular anatomy. Carotid stenosis measurements (when applicable) are obtained utilizing  NASCET criteria, using the distal internal carotid diameter as the denominator. Multiphase CT imaging of the brain was performed following IV bolus contrast injection. Subsequent parametric perfusion maps were calculated using RAPID software. CONTRAST:  Dose is currently not available COMPARISON:  None. FINDINGS: CTA NECK FINDINGS Aortic arch: 2 vessel branching.  No acute finding Right carotid system: Minimal calcified plaque at the bifurcation/bulb. No ulceration or stenosis. Left carotid system: Probable, minimal low-density plaque at the bulb. No stenosis or ulceration. Vertebral arteries: No proximal subclavian stenosis. Borderline left V1 segment narrowing is favored artifactual. Vessels are widely patent to the dura. Skeleton: No acute or aggressive finding. Poor dentition with bilateral maxillary and right mandibular periapical erosions. Other neck: No acute finding Upper chest: No acute finding Review of the MIP images confirms the above findings CTA HEAD FINDINGS Anterior circulation: No major branch occlusion. There is atherosclerotic irregularity of bilateral MCA branches. Dominant right A1 segment Posterior circulation: Mild narrowing of the mid basilar. The left PCA is not seen beyond the P2 segment with subsequent reconstitution. Negative  for aneurysm. Venous sinuses: Patent Anatomic variants: None significant Review of the MIP images confirms the above findings Case discussed in progress with Dr. Otelia Limes CT Brain Perfusion Findings: RAPID software has failed such that perfusion maps were generated on TeraRecon software by myself. Images were sent to PACS. There is prolonged mean transit time within the left occipital lobe with relatively preserved blood volume. IMPRESSION: 1. Left P2 occlusion with downstream reconstitution. Qualitative assessment of CT perfusion maps shows ischemia without visible core infarct in the left occipital lobe. 2. Mild atherosclerosis in the neck without flow limiting  stenosis or embolic source seen. 3. No large vessel occlusion in the anterior circulation. 4. Mild mid basilar narrowing. Electronically Signed   By: Marnee Spring M.D.   On: 04/14/2019 11:02   Ct Head Code Stroke Wo Contrast  Result Date: 04/14/2019 CLINICAL DATA:  Code stroke. Cerebral hemorrhage suspected. Possible stroke right-sided deficits EXAM: CT HEAD WITHOUT CONTRAST TECHNIQUE: Contiguous axial images were obtained from the base of the skull through the vertex without intravenous contrast. COMPARISON:  None. FINDINGS: Brain: Chronic appearing small left frontal infarct involving cortex and white matter. Small chronic appearing left ACA distribution infarct in the paramedian subcortical white matter extending towards the corpus callosum. No hemorrhage, hydrocephalus, or masslike finding. Solitary calcification along the right frontal cortex, nonspecific. Vascular: Negative Skull: Periapical erosions around left maxillary teeth. No acute finding Sinuses/Orbits: No acute finding Other: These results were communicated to Dr. Otelia Limes at 10:32 amon 8/27/2020by text page via the [facility] messaging system. ASPECTS [facility] Stroke Program Early CT Score) - Ganglionic level infarction (caudate, lentiform nuclei, internal capsule, insula, M1-M3 cortex): 7 - Supraganglionic infarction (M4-M6 cortex): 3 -when accounting for chronic infarct Total score (0-10 with 10 being normal): 10 IMPRESSION: 1. No acute finding. 2. Small remote appearing infarcts in the left frontal lobe. Electronically Signed   By: Marnee Spring M.D.   On: 04/14/2019 10:33    Labs:  CBC: Recent Labs    04/14/19 1015 04/14/19 1024  WBC 6.4  --   HGB 12.7* 14.6  HCT 40.7 43.0  PLT 249  --     COAGS: Recent Labs    04/14/19 1015  INR 1.0  APTT 27    BMP: Recent Labs    04/14/19 1015 04/14/19 1024  NA 134* 135  K 3.7 3.7  CL 98 97*  CO2 23  --   GLUCOSE 342* 327*  BUN 11 12  CALCIUM 9.2  --   CREATININE 0.92 0.70   GFRNONAA >60  --   GFRAA >60  --     LIVER FUNCTION TESTS: Recent Labs    04/14/19 1015  BILITOT 0.8  AST 23  ALT 23  ALKPHOS 63  PROT 6.9  ALBUMIN 3.5    TUMOR MARKERS: No results for input(s): AFPTM, CEA, CA199, CHROMGRNA in the last 8760 hours.  Assessment and Plan: Left P2 occlusion   CODE STROKE Last known well around MN.  EMS called by bystander this AM.  Presents with right sided flaccidy, dilopia, tongue deviation, L eye nystagmus suggestive of thalamic stroke.  Perfusion study limited, however with P2 occlusion.   Dr. Corliss Skains has discussed with Dr. Otelia Limes. He has assessed the patient.  Proceed with angiogram and intervention for acute stroke.   Patient with diplopia and weakness of dominant hand.  Verbal consent obtained alongside Dr. Otelia Limes and Dr. Corliss Skains.  Patient is able to participate in assessment and agrees to move forward with intervention.  No family available during assessment.   Thank you for this interesting consult.  I greatly enjoyed meeting Rodney Collier and look forward to participating in their care.  A copy of this report was sent to the requesting provider on this date.  Electronically Signed: Hoyt KochKacie Sue-Ellen Evon Lopezperez, PA 04/14/2019, 11:42 AM   I spent a total of 40 Minutes    in face to face in clinical consultation, greater than 50% of which was counseling/coordinating care for left P2 occlusion

## 2019-04-14 NOTE — Consult Note (Signed)
INR. 53 Y RT H M LSW 12MN ? MRSS 0 . New onset rt sided weakness ,dysarthria,Rt sided sensory loss  and RT sided visual deficit. CT Brain  NO ICH ASPECTS 10 CTA occluded Lt PCA prox  CTP NO rapid maps available . CT perfusion deficit in LT PCA distribution with no abnormality in the CBV. Patient coherent enough to understand his med condition.Option of  Endovascular revascularization discussed with him by me and Dr Cheral Marker . Risks of ICH of 10 % with worsening neuro deficit ,death discussed. Alternatives also reviewed. Patient consented to the endovascular treatment. S.Avie Checo  MD

## 2019-04-14 NOTE — Sedation Documentation (Signed)
Report received from [employer]. RN

## 2019-04-14 NOTE — Progress Notes (Signed)
Patient intubated & placed on vent. Immediately taken to IR & placed on vent in the room by CRNA. Will go back & get patient upon completion of procedure.  Kathie Dike RRT

## 2019-04-14 NOTE — Transfer of Care (Signed)
Immediate Anesthesia Transfer of Care Note  Patient: Rodney Collier  Procedure(s) Performed: IR WITH ANESTHESIA (N/A )  Patient Location: NICU  Anesthesia Type:General  Level of Consciousness: sedated, patient cooperative and Patient remains intubated per anesthesia plan  Airway & Oxygen Therapy: Patient remains intubated per anesthesia plan and Patient placed on Ventilator (see vital sign flow sheet for setting)  Post-op Assessment: Report given to RN and Post -op Vital signs reviewed and stable  Post vital signs: Reviewed and stable  Last Vitals:  Vitals Value Taken Time  BP 113/72 04/14/19 1409  Temp    Pulse 94 04/14/19 1422  Resp 0 04/14/19 1422  SpO2 100 % 04/14/19 1422  Vitals shown include unvalidated device data.  Last Pain:  Vitals:   04/14/19 1049  TempSrc:   PainSc: 0-No pain         Complications: No apparent anesthesia complications

## 2019-04-14 NOTE — Anesthesia Preprocedure Evaluation (Addendum)
Anesthesia Evaluation  Patient identified by MRN, date of birth, ID band Patient awake    Reviewed: Allergy & Precautions, NPO status , Patient's Chart, lab work & pertinent test results  Airway Mallampati: I  TM Distance: >3 FB Neck ROM: Full    Dental   Pulmonary    Pulmonary exam normal        Cardiovascular Normal cardiovascular exam     Neuro/Psych CVA, Residual Symptoms    GI/Hepatic   Endo/Other    Renal/GU      Musculoskeletal   Abdominal   Peds  Hematology   Anesthesia Other Findings   Reproductive/Obstetrics                            Anesthesia Physical Anesthesia Plan  ASA: III and emergent  Anesthesia Plan: General   Post-op Pain Management:    Induction: Intravenous, Rapid sequence and Cricoid pressure planned  PONV Risk Score and Plan: 2  Airway Management Planned: Oral ETT  Additional Equipment:   Intra-op Plan:   Post-operative Plan: Possible Post-op intubation/ventilation  Informed Consent: I have reviewed the patients History and Physical, chart, labs and discussed the procedure including the risks, benefits and alternatives for the proposed anesthesia with the patient or authorized representative who has indicated his/her understanding and acceptance.       Plan Discussed with: CRNA and Surgeon  Anesthesia Plan Comments:         Anesthesia Quick Evaluation

## 2019-04-14 NOTE — Procedures (Signed)
S/P 4 vessel cerebral arteriogram followed by complete revascularization of occluded Lt PCA  With x 2 passes with 60mm x 54mm embotrap retriever  device achieving a TICI 3 revascularization. S.Todrick Siedschlag MD

## 2019-04-14 NOTE — Consult Note (Signed)
NAME:  Faye Sanfilippo, MRN:  [id number], DOB:  01-Apr-1966, LOS: 0 ADMISSION DATE:  04/14/2019, CONSULTATION DATE:  04/14/2019 REFERRING MD:  Neuro IR - Devashwar, CHIEF COMPLAINT:  VDRF   Brief History   53 year old male with no known PMH presenting dysarthric with last seen normal at midnight the day prior to presentation and taken to IR.  PCCM consulted for vent management.  No further history is available.  History of present illness   53 year old male with no known PMH presenting dysarthric with last seen normal at midnight the day prior to presentation and taken to IR.  PCCM consulted for vent management.  No further history is available.  Past Medical History  As below  Significant Hospital Events   To neuro IR for interventions 8/27>>>  Consults:  PCCM  Procedures:  ETT 8/27>>> PIV 8/27>>>  Significant Diagnostic Tests:  Neuro IR for clot retrieval  Micro Data:  N/A  Antimicrobials:  N/A   Interim history/subjective:  Sedated and paralyzed  Objective   Blood pressure (!) 157/94, pulse 77, temperature 98.1 F (36.7 C), temperature source Oral, resp. rate 19, height 6' (1.829 m), SpO2 100 %.    Vent Mode: PRVC FiO2 (%):  [60 %-100 %] 60 % Set Rate:  [15 bmp] 15 bmp Vt Set:  [500 mL-620 mL] 620 mL PEEP:  [5 cmH20] 5 cmH20 Plateau Pressure:  [20 cmH20] 20 cmH20   Intake/Output Summary (Last 24 hours) at 04/14/2019 1432 Last data filed at 04/14/2019 1400 Gross per 24 hour  Intake 1000 ml  Output 405 ml  Net 595 ml   There were no vitals filed for this visit.  Examination: General: Sedated, paralyzed, well appearing, NAD HENT: /AT, pupils sluggish, no EOM, MMM, ETT in place Lungs: Very coarse BS diffusely Cardiovascular: RRR, Nl S1/S2 and -M/R/G Abdomen: Soft, NT, ND and +BS Extremities: -edema and -tenderness Neuro: Sedated, intubated and paralyzed, unable to fully examine Skin: Intact  Resolved Hospital Problem list   N/A  Assessment & Plan:  53  year old male who was visiting who was road side fixing a tire when bystanders noticed he was dysarthric and was brought the ED.  In the ED he was alert and interactive and was taken to IR for intervention.  Never got tPA.  Last seen well was midnight.  On exam, he is sedated and paralyzed.  Not following commands with very coarse BS.  I reviewed CXR myself, ETT is in a good position.  Discussed with PCCM-NP.  VDRF: Post procedure was too somnolent to extubate  - Full vent support  - ABG now  - Adjust vent for ABG  - VAP prevention protocol  - Titrate O2 for sat of 88-92%  - SBT in AM to likely extubate  Acute encephalopathy:  - Propofol drip  - Fentanyl PRN  - Hold sedation once paralytics are off to evaluate mental status  HTN:  - Cleviprex for BP control  - Will need to ascertain home regiment once extubated and SLP is done  DM:  - SSI  - CBG  - TF in am if unable to extubate  PCCM will assist with medical management.  Labs   CBC: Recent Labs  Lab 04/14/19 1015 04/14/19 1024  WBC 6.4  --   NEUTROABS 4.7  --   HGB 12.7* 14.6  HCT 40.7 43.0  MCV 72.9*  --   PLT 249  --     Basic Metabolic Panel: Recent  Labs  Lab 04/14/19 1015 04/14/19 1024  NA 134* 135  K 3.7 3.7  CL 98 97*  CO2 23  --   GLUCOSE 342* 327*  BUN 11 12  CREATININE 0.92 0.70  CALCIUM 9.2  --    GFR: CrCl cannot be calculated (Unknown ideal weight.). Recent Labs  Lab 04/14/19 1015  WBC 6.4    Liver Function Tests: Recent Labs  Lab 04/14/19 1015  AST 23  ALT 23  ALKPHOS 63  BILITOT 0.8  PROT 6.9  ALBUMIN 3.5   No results for input(s): LIPASE, AMYLASE in the last 168 hours. No results for input(s): AMMONIA in the last 168 hours.  ABG    Component Value Date/Time   TCO2 21 (L) 04/14/2019 1024     Coagulation Profile: Recent Labs  Lab 04/14/19 1015  INR 1.0    Cardiac Enzymes: No results for input(s): CKTOTAL, CKMB, CKMBINDEX, TROPONINI in the last 168 hours.  HbA1C:  No results found for: HGBA1C  CBG: Recent Labs  Lab 04/14/19 1015  GLUCAP 336*    Review of Systems:   Unattainable, sedated, intubated and paralyzed  Past Medical History  He,  has no past medical history on file.   Surgical History   Unknown   Social History      Family History   His family history includes Hypertension in his father and mother.   Allergies No Known Allergies   Home Medications  Prior to Admission medications   Not on File    The patient is critically ill with multiple organ systems failure and requires high complexity decision making for assessment and support, frequent evaluation and titration of therapies, application of advanced monitoring technologies and extensive interpretation of multiple databases.   Critical Care Time devoted to patient care services described in this note is  45  Minutes. This time reflects time of care of this signee Dr Koren BoundWesam Yacoub. This critical care time does not reflect procedure time, or teaching time or supervisory time of PA/NP/Med student/Med Resident etc but could involve care discussion time.  Alyson ReedyWesam G. Yacoub, M.D. [facility]. Pager: [phone]. After hours pager: [phone].

## 2019-04-14 NOTE — Anesthesia Procedure Notes (Signed)
Arterial Line Insertion Start/End8/27/2020 12:00 PM, 04/14/2019 12:09 PM Performed by: Lavell Luster, CRNA, CRNA  Patient location: Pre-op. Preanesthetic checklist: patient identified, IV checked, site marked, risks and benefits discussed, surgical consent, monitors and equipment checked, pre-op evaluation, timeout performed and anesthesia consent Patient sedated Left, radial was placed Catheter size: 20 G Hand hygiene performed , maximum sterile barriers used  and Seldinger technique used  Attempts: 1 Procedure performed without using ultrasound guided technique. Following insertion, dressing applied and Biopatch. Post procedure assessment: normal and unchanged  Patient tolerated the procedure well with no immediate complications.
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
## 2019-04-14 NOTE — ED Notes (Signed)
Help get patient cleaned up patient is is resting with nurses and doctors at bedside

## 2019-04-15 ENCOUNTER — Inpatient Hospital Stay (HOSPITAL_COMMUNITY): Payer: Medicaid - Out of State

## 2019-04-15 ENCOUNTER — Encounter (HOSPITAL_COMMUNITY): Payer: Self-pay | Admitting: Radiology

## 2019-04-15 DIAGNOSIS — I340 Nonrheumatic mitral (valve) insufficiency: Secondary | ICD-10-CM

## 2019-04-15 DIAGNOSIS — R569 Unspecified convulsions: Secondary | ICD-10-CM

## 2019-04-15 DIAGNOSIS — I63433 Cerebral infarction due to embolism of bilateral posterior cerebral arteries: Secondary | ICD-10-CM

## 2019-04-15 DIAGNOSIS — E1159 Type 2 diabetes mellitus with other circulatory complications: Secondary | ICD-10-CM

## 2019-04-15 DIAGNOSIS — I639 Cerebral infarction, unspecified: Secondary | ICD-10-CM

## 2019-04-15 DIAGNOSIS — R739 Hyperglycemia, unspecified: Secondary | ICD-10-CM

## 2019-04-15 LAB — CBC WITH DIFFERENTIAL/PLATELET
Abs Immature Granulocytes: 0.03 10*3/uL (ref 0.00–0.07)
Basophils Absolute: 0 10*3/uL (ref 0.0–0.1)
Basophils Relative: 0 %
Eosinophils Absolute: 0 10*3/uL (ref 0.0–0.5)
Eosinophils Relative: 0 %
HCT: 40.6 % (ref 39.0–52.0)
Hemoglobin: 12.3 g/dL — ABNORMAL LOW (ref 13.0–17.0)
Immature Granulocytes: 0 %
Lymphocytes Relative: 21 %
Lymphs Abs: 2.1 10*3/uL (ref 0.7–4.0)
MCH: 22.5 pg — ABNORMAL LOW (ref 26.0–34.0)
MCHC: 30.3 g/dL (ref 30.0–36.0)
MCV: 74.2 fL — ABNORMAL LOW (ref 80.0–100.0)
Monocytes Absolute: 0.9 10*3/uL (ref 0.1–1.0)
Monocytes Relative: 9 %
Neutro Abs: 7 10*3/uL (ref 1.7–7.7)
Neutrophils Relative %: 70 %
Platelets: 254 10*3/uL (ref 150–400)
RBC: 5.47 MIL/uL (ref 4.22–5.81)
RDW: 14.4 % (ref 11.5–15.5)
WBC: 10.1 10*3/uL (ref 4.0–10.5)
nRBC: 0 % (ref 0.0–0.2)

## 2019-04-15 LAB — POCT I-STAT 7, (LYTES, BLD GAS, ICA,H+H)
Acid-base deficit: 3 mmol/L — ABNORMAL HIGH (ref 0.0–2.0)
Bicarbonate: 19.4 mmol/L — ABNORMAL LOW (ref 20.0–28.0)
Calcium, Ion: 1.27 mmol/L (ref 1.15–1.40)
HCT: 36 % — ABNORMAL LOW (ref 39.0–52.0)
Hemoglobin: 12.2 g/dL — ABNORMAL LOW (ref 13.0–17.0)
O2 Saturation: 98 %
Patient temperature: 99.7
Potassium: 3.9 mmol/L (ref 3.5–5.1)
Sodium: 140 mmol/L (ref 135–145)
TCO2: 20 mmol/L — ABNORMAL LOW (ref 22–32)
pCO2 arterial: 27.8 mmHg — ABNORMAL LOW (ref 32.0–48.0)
pH, Arterial: 7.453 — ABNORMAL HIGH (ref 7.350–7.450)
pO2, Arterial: 93 mmHg (ref 83.0–108.0)

## 2019-04-15 LAB — LIPID PANEL
Cholesterol: 139 mg/dL (ref 0–200)
HDL: 39 mg/dL — ABNORMAL LOW (ref >40)
LDL Cholesterol: 88 mg/dL (ref 0–99)
Total CHOL/HDL Ratio: 3.6 RATIO
Triglycerides: 61 mg/dL (ref <150)
VLDL: 12 mg/dL (ref 0–40)

## 2019-04-15 LAB — GLUCOSE, CAPILLARY
Glucose-Capillary: 104 mg/dL — ABNORMAL HIGH (ref 70–99)
Glucose-Capillary: 104 mg/dL — ABNORMAL HIGH (ref 70–99)
Glucose-Capillary: 109 mg/dL — ABNORMAL HIGH (ref 70–99)
Glucose-Capillary: 114 mg/dL — ABNORMAL HIGH (ref 70–99)
Glucose-Capillary: 126 mg/dL — ABNORMAL HIGH (ref 70–99)
Glucose-Capillary: 139 mg/dL — ABNORMAL HIGH (ref 70–99)
Glucose-Capillary: 140 mg/dL — ABNORMAL HIGH (ref 70–99)
Glucose-Capillary: 141 mg/dL — ABNORMAL HIGH (ref 70–99)
Glucose-Capillary: 143 mg/dL — ABNORMAL HIGH (ref 70–99)
Glucose-Capillary: 149 mg/dL — ABNORMAL HIGH (ref 70–99)
Glucose-Capillary: 153 mg/dL — ABNORMAL HIGH (ref 70–99)
Glucose-Capillary: 153 mg/dL — ABNORMAL HIGH (ref 70–99)
Glucose-Capillary: 157 mg/dL — ABNORMAL HIGH (ref 70–99)
Glucose-Capillary: 167 mg/dL — ABNORMAL HIGH (ref 70–99)

## 2019-04-15 LAB — ECHOCARDIOGRAM COMPLETE
Height: 72 in
Weight: 2737.23 oz

## 2019-04-15 LAB — HEMOGLOBIN A1C
Hgb A1c MFr Bld: 12.3 % — ABNORMAL HIGH (ref 4.8–5.6)
Mean Plasma Glucose: 306.31 mg/dL

## 2019-04-15 LAB — BASIC METABOLIC PANEL
Anion gap: 12 (ref 5–15)
BUN: 10 mg/dL (ref 6–20)
CO2: 17 mmol/L — ABNORMAL LOW (ref 22–32)
Calcium: 8.9 mg/dL (ref 8.9–10.3)
Chloride: 109 mmol/L (ref 98–111)
Creatinine, Ser: 0.82 mg/dL (ref 0.61–1.24)
GFR calc Af Amer: >60 mL/min (ref >60)
GFR calc non Af Amer: >60 mL/min (ref >60)
Glucose, Bld: 151 mg/dL — ABNORMAL HIGH (ref 70–99)
Potassium: 4.8 mmol/L (ref 3.5–5.1)
Sodium: 138 mmol/L (ref 135–145)

## 2019-04-15 LAB — TRIGLYCERIDES: Triglycerides: 64 mg/dL

## 2019-04-15 LAB — MAGNESIUM: Magnesium: 1.8 mg/dL (ref 1.7–2.4)

## 2019-04-15 LAB — PHOSPHORUS: Phosphorus: 2.6 mg/dL (ref 2.5–4.6)

## 2019-04-15 LAB — HIV ANTIBODY (ROUTINE TESTING W REFLEX): HIV Screen 4th Generation wRfx: NONREACTIVE

## 2019-04-15 MED ORDER — INSULIN ASPART 100 UNIT/ML ~~LOC~~ SOLN
2.0000 [IU] | SUBCUTANEOUS | Status: DC
  Administered 2019-04-15: 2 [IU] via SUBCUTANEOUS
  Administered 2019-04-16: 01:00:00 4 [IU] via SUBCUTANEOUS
  Administered 2019-04-16 – 2019-04-19 (×4): 2 [IU] via SUBCUTANEOUS

## 2019-04-15 MED ORDER — ASPIRIN EC 325 MG PO TBEC: 325.0000 mg | DELAYED_RELEASE_TABLET | Freq: Every day | ORAL | Status: DC

## 2019-04-15 MED ORDER — DEXTROSE 10 % IV SOLN: INTRAVENOUS | Status: DC | PRN

## 2019-04-15 MED ORDER — AMLODIPINE BESYLATE 5 MG PO TABS
5.0000 mg | ORAL_TABLET | Freq: Every day | ORAL | Status: DC
  Filled 2019-04-15: qty 1

## 2019-04-15 MED ORDER — AMLODIPINE BESYLATE 5 MG PO TABS: 5.0000 mg | ORAL_TABLET | Freq: Every day | ORAL | Status: DC

## 2019-04-15 MED ORDER — HYDRALAZINE HCL 20 MG/ML IJ SOLN
5.0000 mg | INTRAMUSCULAR | Status: DC | PRN
  Administered 2019-04-17: 10 mg via INTRAVENOUS
  Filled 2019-04-15 (×2): qty 1

## 2019-04-15 MED ORDER — ATORVASTATIN CALCIUM 10 MG PO TABS: 20.0000 mg | ORAL_TABLET | Freq: Every day | ORAL | Status: DC

## 2019-04-15 MED ORDER — ATORVASTATIN CALCIUM 10 MG PO TABS
20.0000 mg | ORAL_TABLET | Freq: Every day | ORAL | Status: DC
  Administered 2019-04-15 – 2019-04-20 (×4): 20 mg
  Filled 2019-04-15 (×4): qty 2

## 2019-04-15 MED ORDER — AMLODIPINE BESYLATE 5 MG PO TABS
5.0000 mg | ORAL_TABLET | Freq: Every day | ORAL | Status: DC
  Administered 2019-04-15 – 2019-04-21 (×4): 5 mg
  Filled 2019-04-15 (×3): qty 1

## 2019-04-15 MED ORDER — ASPIRIN 325 MG PO TABS
325.0000 mg | ORAL_TABLET | Freq: Every day | ORAL | Status: DC
  Administered 2019-04-15: 12:00:00 325 mg via ORAL
  Filled 2019-04-15: qty 1

## 2019-04-15 MED ORDER — INSULIN DETEMIR 100 UNIT/ML ~~LOC~~ SOLN
8.0000 [IU] | Freq: Two times a day (BID) | SUBCUTANEOUS | Status: DC
  Administered 2019-04-15 – 2019-04-16 (×3): 8 [IU] via SUBCUTANEOUS
  Filled 2019-04-15 (×6): qty 0.08

## 2019-04-15 MED ORDER — LEVETIRACETAM IN NACL 500 MG/100ML IV SOLN
500.0000 mg | Freq: Two times a day (BID) | INTRAVENOUS | Status: DC
  Administered 2019-04-15 – 2019-04-20 (×11): 500 mg via INTRAVENOUS
  Filled 2019-04-15 (×11): qty 100

## 2019-04-15 MED ORDER — ASPIRIN 325 MG PO TABS
325.0000 mg | ORAL_TABLET | Freq: Every day | ORAL | Status: DC
  Administered 2019-04-19 – 2019-04-21 (×3): 325 mg
  Filled 2019-04-15 (×3): qty 1

## 2019-04-15 MED ORDER — SENNOSIDES-DOCUSATE SODIUM 8.6-50 MG PO TABS: 1.0000 | ORAL_TABLET | Freq: Every evening | ORAL | Status: DC | PRN

## 2019-04-15 MED ORDER — INSULIN ASPART 100 UNIT/ML ~~LOC~~ SOLN
2.0000 [IU] | SUBCUTANEOUS | Status: DC
  Administered 2019-04-18 – 2019-04-19 (×5): 2 [IU] via SUBCUTANEOUS

## 2019-04-15 NOTE — Progress Notes (Addendum)
NAME:  Rodney Collier, MRN:  [id number], DOB:  12-16-1965, LOS: 1 ADMISSION DATE:  04/14/2019, CONSULTATION DATE:  04/14/2019 REFERRING MD:  Neuro IR - Devashwar, CHIEF COMPLAINT:  AMS   Brief History   53 year old male presents 8/27 with no known PMH presenting dysarthric with last seen normal at midnight the day prior to presentation and taken to IR.  PCCM consulted for vent management.  No further history is available.  Past Medical History  As below  Significant Hospital Events   8/27 NIR for revascularization of occluded PCA  Consults:  PCCM NIR  Procedures:  ETT 8/27>>> PIV 8/27>>>  Significant Diagnostic Tests:  8/27 CT H> remote L frontal infarcts 8/27 CTA head/neck> L P2 occlusion 8/28 MRI Brain> acute L PCA infarct including much of L thalamus, L>R cerebellum, R pons. Petechial hemorrhage at level of pons. Prior L frontal infarcts. 8/28 CXR> no acute cardiopulmonary abnormality. ETT NGT in stable position   Micro Data:  8/27 SARS CoV2> neg   Antimicrobials:  N/A   Interim history/subjective:  Overnight concern for seizure activity, with full body rhythmic shaking noted by RN Keppra loaded by neurology, without subsequent episodes. EEG to be completed this morning   Cleviprex and insulin gtt weaned off overnight  AM Labs pending   Failed SBT this morning, apnea   Objective   Blood pressure 130/75, pulse 62, temperature 99.7 F (37.6 C), temperature source Axillary, resp. rate 15, height 6' (1.829 m), weight 77.6 kg, SpO2 100 %.    Vent Mode: PRVC FiO2 (%):  [30 %-100 %] 30 % Set Rate:  [15 bmp] 15 bmp Vt Set:  [500 mL-620 mL] 620 mL PEEP:  [5 cmH20] 5 cmH20 Plateau Pressure:  [19 cmH20-20 cmH20] 20 cmH20   Intake/Output Summary (Last 24 hours) at 04/15/2019 0726 Last data filed at 04/15/2019 0700 Gross per 24 hour  Intake 2726.3 ml  Output 2905 ml  Net -178.7 ml   Filed Weights   04/14/19 1500  Weight: 77.6 kg    Examination: General: ill  appearing adult male, intubated, sedated, NAD  HENT: NCAT ETT secure trachea midline pink mmm anicteric sclera  Lungs: CTA bilaterally. Symmetrical chest expansion, no accessory muscle use  Cardiovascular: bradycardic rate. s1s2 no rgm. Cap refill < 3 sec, 2+ radial pulses  Abdomen: soft, flat, ndnt, + bowel sounds  Extremities: Symmetrical bulk and tone, no obvious deformity. No cyanosis no clubbing no edema  Neuro: Somnolent. 21mm PERRL, sluggish. Moves LUE LLE to command. Does not withdraw from RUE RLE painful stimuli. Skin: Cool RUE RLE distally. Skin is clean, dry, without rash   Resolved Hospital Problem list   N/A  Assessment & Plan:   Acute respiratory failure requiring intubation -in setting of CVA  -possible seizure overnight  -Failed SBT off of propofol 8/28 due to apnea.  P -WUA/SBT qAM -holding on extubation due to poor mental status, failed SBT with apnea, concern for seizure with EEG pending.  -continue pulm hygiene -CXR PRN    Acute encephalopathy in setting of CVA -L PCA infarct involving much of L pons, L>R cerebellum, R pons  -Overnight concern for seizure P -CVA per neuro -Keppra load overnight per neuro, EEG to be completed this morning  HTN P -SBP goal per NIR -arterial line dc overnight, continue cuff pressures  -Cleviprex for BP  DM P -SSI  Malnutrition, at risk P -Defer initiation of EN at this time due to recent volumes of both cleviprex and propofol  which have provided kcal -If mental status does not improve and it appears patient will remain intubated for several days, can consider EN.  -If anticipate patient will extubate in next couple days is ok to continue to defer EN     Labs   CBC: Recent Labs  Lab 04/14/19 1015 04/14/19 1024 04/14/19 1438 04/15/19 0545  WBC 6.4  --   --   --   NEUTROABS 4.7  --   --   --   HGB 12.7* 14.6 12.9* 12.2*  HCT 40.7 43.0 38.0* 36.0*  MCV 72.9*  --   --   --   PLT 249  --   --   --     Basic  Metabolic Panel: Recent Labs  Lab 04/14/19 1015 04/14/19 1024 04/14/19 1438 04/15/19 0545  NA 134* 135 137 140  K 3.7 3.7 3.7 3.9  CL 98 97*  --   --   CO2 23  --   --   --   GLUCOSE 342* 327*  --   --   BUN 11 12  --   --   CREATININE 0.92 0.70  --   --   CALCIUM 9.2  --   --   --    GFR: Estimated Creatinine Clearance: 118.6 mL/min (by C-G formula based on SCr of 0.7 mg/dL). Recent Labs  Lab 04/14/19 1015  WBC 6.4    Liver Function Tests: Recent Labs  Lab 04/14/19 1015  AST 23  ALT 23  ALKPHOS 63  BILITOT 0.8  PROT 6.9  ALBUMIN 3.5   No results for input(s): LIPASE, AMYLASE in the last 168 hours. No results for input(s): AMMONIA in the last 168 hours.  ABG    Component Value Date/Time   PHART 7.453 (H) 04/15/2019 0545   PCO2ART 27.8 (L) 04/15/2019 0545   PO2ART 93.0 04/15/2019 0545   HCO3 19.4 (L) 04/15/2019 0545   TCO2 20 (L) 04/15/2019 0545   ACIDBASEDEF 3.0 (H) 04/15/2019 0545   O2SAT 98.0 04/15/2019 0545     Coagulation Profile: Recent Labs  Lab 04/14/19 1015  INR 1.0    Cardiac Enzymes: No results for input(s): CKTOTAL, CKMB, CKMBINDEX, TROPONINI in the last 168 hours.  HbA1C: Hgb A1c MFr Bld  Date/Time Value Ref Range Status  04/14/2019 08:30 PM 12.3 (H) 4.8 - 5.6 % Final    Comment:    (NOTE) Pre diabetes:          5.7%-6.4% Diabetes:              >6.4% Glycemic control for   <7.0% adults with diabetes     CBG: Recent Labs  Lab 04/15/19 0247 04/15/19 0357 04/15/19 0509 04/15/19 0619 04/15/19 0702  GLUCAP 114* 140* 157* 167* 141*      CRITICAL CARE  Total critical care time: 35 minutes  Critical care time was exclusive of separately billable procedures and treating other patients.  Critical care was necessary to treat or prevent imminent or life-threatening deterioration.  Critical care was time spent personally by me on the following activities: development of treatment plan with patient and/or surrogate as well as  nursing, discussions with consultants, evaluation of patient's response to treatment, examination of patient, obtaining history from patient or surrogate, ordering and performing treatments and interventions, ordering and review of laboratory studies, ordering and review of radiographic studies, pulse oximetry and re-evaluation of patient's condition.  Tessie Fass MSN, AGACNP-BC [place] [facility] [phone] If no answer, [phone] 04/15/2019,  7:27 AM

## 2019-04-15 NOTE — Progress Notes (Signed)
Initial Nutrition Assessment  DOCUMENTATION CODES:   Not applicable  INTERVENTION:   -If unable to extubate within 48 hours, recommend:  Initiate Vital 1.5 @ 55 ml/hr via OGT  30 ml Prostat daily.    Tube feeding regimen provides 2080 kcal (100% of needs), 104 grams of protein, and 1008 ml of H2O.   NUTRITION DIAGNOSIS:   Inadequate oral intake related to inability to eat as evidenced by NPO status.  GOAL:   Patient will meet greater than or equal to 90% of their needs  MONITOR:   Vent status, Labs, Weight trends, Skin, I & O's  REASON FOR ASSESSMENT:   Ventilator    ASSESSMENT:   Rodney Collier is a 53 y.o. male with no significant past medical history presenting with right-sided weakness, decreased sensation on the right, diplopia and headache. Complete revascularization of occluded L PCA w/ mechanical thrombectomy.  Patient is currently intubated on ventilator support. OGT in placed, connected, to low, intermittent suction.  MV: 8.9 L/min Temp (24hrs), Avg:99.3 F (37.4 C), Min:98.1 F (36.7 C), Max:100.5 F (38.1 C)  8/27- s/p Left PCA occlusion s/p emergent mechanical thrombectomy achieving a TICI 3 revascularization  Reviewed I/O's: +179 ml x 24 hours  UOP: 2.8 L x 24 hours  NGT output: 125 ml x 24 hours  Per neuro notes, possible extubation tomorrow (failed weaning trial secondary to apnea).   Labs reviewed: CBGS: 139-159 (inpatient orders for glycemic control are 2 units insulin aspart every 4 hours, 2-6 units insulin aspart every 4 hours, and 8 units insulin detemir every 12 hours).   NUTRITION - FOCUSED PHYSICAL EXAM:    Most Recent Value  Orbital Region  No depletion  Upper Arm Region  No depletion  Thoracic and Lumbar Region  No depletion  Buccal Region  No depletion  Temple Region  No depletion  Clavicle Bone Region  No depletion  Clavicle and Acromion Bone Region  No depletion  Scapular Bone Region  No depletion  Dorsal Hand  No  depletion  Patellar Region  No depletion  Anterior Thigh Region  No depletion  Posterior Calf Region  No depletion  Edema (RD Assessment)  None  Hair  Reviewed  Eyes  Reviewed  Mouth  Reviewed  Skin  Reviewed  Nails  Reviewed       Diet Order:   Diet Order            Diet NPO time specified  Diet effective midnight        Diet NPO time specified  Diet effective now              EDUCATION NEEDS:   Not appropriate for education at this time  Skin:  Skin Assessment: Skin Integrity Issues: Skin Integrity Issues:: Other (Comment) Other: lt thigh puncture wound  Last BM:  Unknown  Height:   Ht Readings from Last 1 Encounters:  04/14/19 6' (1.829 m)    Weight:   Wt Readings from Last 1 Encounters:  04/14/19 77.6 kg    Ideal Body Weight:  80.9 kg  BMI:  Body mass index is 23.2 kg/m.  Estimated Nutritional Needs:   Kcal:  1980  Protein:  100-115 grams  Fluid:  > 2.0 L    Rodney Collier A. Rodney Collier, RD, LDN, [place] Registered Dietitian II Certified Diabetes Care and Education Specialist Pager: [phone] After hours Pager: [phone]

## 2019-04-15 NOTE — Progress Notes (Addendum)
Pt self extubated at 2304 while wearing mittens. RT notified and assessed pt. Pt respiratory status is stable at this time all parameters are within acceptable range. RR 15, SpO2 96%, Pt is resting comfortably. CCM notified of pt self extubation and present with pt at this time. RT will continue to monitor.

## 2019-04-15 NOTE — Progress Notes (Signed)
OT Cancellation Note  Patient Details Name: Rodney Collier MRN: [id number] DOB: Jan 09, 1966   Cancelled Treatment:    Reason Eval/Treat Not Completed: Patient not medically ready pt with bedrest, intubated and pending EEG due to possible seizure activity.  Jeri Modena, OTR/L  Acute Rehabilitation Services Pager: [phone] Office: [phone] .   Jeri Modena 04/15/2019, 9:06 AM

## 2019-04-15 NOTE — Progress Notes (Signed)
Vent alarm was going off and when RN entered the room, the ETT tube was on the pt's chest along with the holder. Pt was wearing mittens. This RN oral suctioned the patient and notified CCM. Patient was able to follow commands and give cough on command. Pt's O2 sats is 96% on RA. Pt is not in any distress. Rhonda with CCM came to bedside and assessed pt. RN will continue to monitor pt.

## 2019-04-15 NOTE — Progress Notes (Signed)
SLP Cancellation Note  Patient Details Name: Judea Riches MRN: [id number] DOB: 08-16-1966   Cancelled treatment:       Reason Eval/Treat Not Completed: Medical issues which prohibited therapy(Pt remains intubated at this time. )  Sharlynn Seckinger I. Hardin Negus, [place], [place] Office number [phone] Pager [phone]  Horton Marshall 04/15/2019, 7:50 AM

## 2019-04-15 NOTE — Plan of Care (Signed)
  Problem: Ischemic Stroke/TIA Tissue Perfusion: Goal: Complications of ischemic stroke/TIA will be minimized Outcome: Progressing   

## 2019-04-15 NOTE — Progress Notes (Signed)
RT obtained ABG with the following results. RT will continue to monitor.      Ref. Range 04/15/2019 05:45  Sample type Unknown ARTERIAL  pH, Arterial Latest Ref Range: 7.350 - 7.450  7.453 (H)  pCO2 arterial Latest Ref Range: 32.0 - 48.0 mmHg 27.8 (L)  pO2, Arterial Latest Ref Range: 83.0 - 108.0 mmHg 93.0  TCO2 Latest Ref Range: 22 - 32 mmol/L 20 (L)  Acid-base deficit Latest Ref Range: 0.0 - 2.0 mmol/L 3.0 (H)  Bicarbonate Latest Ref Range: 20.0 - 28.0 mmol/L 19.4 (L)  O2 Saturation Latest Units: % 98.0  Patient temperature Unknown 99.7 F  Collection site Unknown RADIAL, ALLEN'S TEST ACCEPTABLE

## 2019-04-15 NOTE — Progress Notes (Signed)
PT Cancellation Note  Patient Details Name: Rodney Collier MRN: [id number] DOB: February 12, 1966   Cancelled Treatment:    Reason Eval/Treat Not Completed: Patient not medically ready Patient not medically ready pt with bedrest, intubated and pending EEG due to possible seizure activity.  Ellamae Sia, PT, DPT Acute Rehabilitation Services Pager [phone] Office [phone]    Willy Eddy 04/15/2019, 9:24 AM

## 2019-04-15 NOTE — Progress Notes (Signed)
Venous duplex lower ext  has been completed. Refer to [facility] under chart review to view preliminary results.   04/15/2019  10:59 AM Malone Vanblarcom, Bonnye Fava

## 2019-04-15 NOTE — Progress Notes (Addendum)
Unable to identify family members. Called [place] and [place], [place] police departments. Both departments said they would dispatch to the known addresses to see if any family members can by notified.   Follow up @ 1900: [place] PD went to address listed on hospital account; current residents do not know patient and have not heard of him.  [place] PD went to his address listed on drivers license. No one was home, but will try again, later.

## 2019-04-15 NOTE — Progress Notes (Signed)
STROKE TEAM PROGRESS NOTE   INTERVAL HISTORY Pt still intubated, off sedation, cleviprex. Not able to extubate due apnea on weaning trial. Plan for extubation tomorrow if improves. He is able to follow some simple commands but not all of them, not sure if there is any language barrier. BP stable at goal. Had overnight seizure like activity, loaded with kappra and EEG report pending.  Vitals:   04/15/19 0700 04/15/19 0715 04/15/19 0742 04/15/19 0800  BP: (!) 145/84 130/75  129/76  Pulse: 66 62 63 80  Resp: 15 15 15 17   Temp:  98.1 F (36.7 C)    TempSrc:  Axillary    SpO2: 100% 100% 98% 97%  Weight:      Height:        CBC:  Recent Labs  Lab 04/14/19 1015  04/15/19 0545 04/15/19 0703  WBC 6.4  --   --  10.1  NEUTROABS 4.7  --   --  7.0  HGB 12.7*   < > 12.2* 12.3*  HCT 40.7   < > 36.0* 40.6  MCV 72.9*  --   --  74.2*  PLT 249  --   --  254   < > = values in this interval not displayed.    Basic Metabolic Panel:  Recent Labs  Lab 04/14/19 1015 04/14/19 1024  04/15/19 0545 04/15/19 0703  NA 134* 135   < > 140 138  K 3.7 3.7   < > 3.9 4.8  CL 98 97*  --   --  109  CO2 23  --   --   --  17*  GLUCOSE 342* 327*  --   --  151*  BUN 11 12  --   --  10  CREATININE 0.92 0.70  --   --  0.82  CALCIUM 9.2  --   --   --  8.9  MG  --   --   --   --  1.8  PHOS  --   --   --   --  2.6   < > = values in this interval not displayed.   Lipid Panel:     Component Value Date/Time   CHOL 139 04/15/2019 0703   TRIG 64 04/15/2019 0703   TRIG 61 04/15/2019 0703   HDL 39 (L) 04/15/2019 0703   CHOLHDL 3.6 04/15/2019 0703   VLDL 12 04/15/2019 0703   LDLCALC 88 04/15/2019 0703   HgbA1c:  Lab Results  Component Value Date   HGBA1C 12.3 (H) 04/15/2019   Urine Drug Screen: No results found for: LABOPIA, COCAINSCRNUR, LABBENZ, AMPHETMU, THCU, LABBARB  Alcohol Level No results found for: [facility]  IMAGING  [name] 04/15/2019 1. Acute infarction throughout the left PCA  territory (including most of the left thalamus), left more than right cerebellum, and right pons. Petechial hemorrhage is seen at the level of the pons. 2. Prior left frontal infarcts and microvascular ischemic gliosis.   Cerebral angiogram 04/14/2019 4 vessel cerebral arteriogram followed by complete revascularization of occluded Lt PCA  With x 2 passes with 68mm x 27mm embotrap retriever  device achieving a TICI 3 revascularization.  Ct Code Stroke Cta Head W/wo Contrast Ct Code Stroke Cta Neck W/wo Contrast Ct Code Stroke Cta Cerebral Perfusion W/wo Contrast 04/14/2019 1. Left P2 occlusion with downstream reconstitution. Qualitative assessment of CT perfusion maps shows ischemia without visible core infarct in the left occipital lobe. 2. Mild atherosclerosis in the neck without flow  limiting stenosis or embolic source seen. 3. No large vessel occlusion in the anterior circulation. 4. Mild mid basilar narrowing.   Ct Head Code Stroke Wo Contrast 04/14/2019 1. No acute finding. 2. Small remote appearing infarcts in the left frontal lobe.    LE Doppler pending  2D Echocardiogram pending   EEG pending   PHYSICAL EXAM  Temp:  [98.1 F (36.7 C)-99.9 F (37.7 C)] 98.1 F (36.7 C) (08/28 0715) Pulse Rate:  [54-112] 56 (08/28 1000) Resp:  [12-19] 15 (08/28 1000) BP: (91-159)/(55-94) 128/76 (08/28 1000) SpO2:  [97 %-100 %] 100 % (08/28 1000) Arterial Line BP: (80-261)/(46-125) 128/125 (08/28 0400) FiO2 (%):  [30 %-100 %] 30 % (08/28 0742) Weight:  [77.6 kg] 77.6 kg (08/27 1500)  General - Well nourished, well developed, intubated off sedation.  Ophthalmologic - fundi not visualized due to noncooperation.  Cardiovascular - Regular rate and rhythm.  Neuro - intubated off sedation, eyes open on voice, following most of the simple commands. Eyes mid position, able to have left gaze with right eye nystagmus, seems to have right gaze palsy. PERRL, not blinking to visual threat bilaterally.  Corneal reflex present, gag and cough present. Breathing over the vent.  Facial symmetry not able to test due to ET tube.  Tongue protrusion not cooperative. Against gravity of LUE but significant ataxia, able to follow 2/4 simple commands with left hand. Barely against gravity LLE, but follow simple commands of the left foot. On pain stimulation, slight withdraw of RLE but no movement of RUE. DTR 1+ and no babinski. Sensation and gait not tested.   ASSESSMENT/PLAN [name] with no significant past medical history presenting with right-sided weakness, decreased sensation on the right, diplopia and headache. Complete revascularization of occluded L PCA w/ mechanical thrombectomy.  Stroke: embolic posterior circulation infarcts with left P2 occlusion s/p IR with TICI3 reperfusion - embolic secondary to unknown source, likely hypercoagulable state associated with severe DM and hyperglycemia  Code Stroke CT head No acute abnormality. Old infarct L frontal lobe.   CTA head & neck L P2 occlusion. Mild neck atherosclerosis . No LVO anterior circulation. Mild mid BA narrowing.  CT perfusion ischemia w/o visible core  Cerebral angio TICI reperfusion of occluded L PCA  Post IR CT brain no ICH.  MRI  L PCA (including thalamus), L>R cerebellum, R pontine and left midbrain infarct. Petechial hemorrhage seen in pons. Old L frontal infarcts. Small vessel disease.   LE Doppler no DVT  2D Echo pending  Tentative TEE on Monday   UDS pending   LDL 88  HgbA1c 12.3  Hypercoagulable labs pending   SCDs for VTE prophylaxis  No antithrombotic prior to admission, now on aspirin 325 mg daily.   Therapy recommendations:  pending   Disposition:  pending   VDRF  Too somnolent with apnea to extubate post procedure  CCM on board  Plan for extubation attempt in am  Possible seizure  Full body rigid shaking hours post IR, L>R  Loaded with keppra  EEG pending    Continue keppra 500mg  bid  Blood Pressure  On cleviprex gtt for BP contron post IR, now off . BP goals 120-140 x 24h post IR . Long-term BP goal normotensive  Hyperlipidemia  Home meds:  No statin  LDL 88, goal < 70  Add lipitor 20  Continue statin at discharge  Diabetes type II, new diagnosis, Uncontrolled  HgbA1c 12.3, goal < 7.0  CBGs  SSI  Hyperglycemia, improved   On levemir  Likely hypercoagulable condition associated with uncontrolled DM  Dysphagia . Secondary to stroke . NPO . Speech once extubated . May consider TF if not able to extubate tomorrow   Other Stroke Risk Factors     Other Active Problems    Hospital day # 1  This patient is critically ill due to embolic stroke, s/p IR thrombectomy, hyperglycemia, intubated, seizure activity and at significant risk of neurological worsening, death form recurrent stroke, DKA, status epilepticus, hemorrhagic conversion. This patient's care requires constant monitoring of vital signs, hemodynamics, respiratory and cardiac monitoring, review of multiple databases, neurological assessment, discussion with family, other specialists and medical decision making of high complexity. I spent 40 minutes of neurocritical care time in the care of this patient.  Marvel PlanJindong Medrith Veillon, MD PhD Stroke Neurology 04/15/2019 11:36 AM  To contact Stroke Continuity provider, please refer to [email or web address]. After hours, contact General Neurology

## 2019-04-15 NOTE — Progress Notes (Signed)
Brief progress note.  S: Called to bedside for self-extubation.   O/A: Pt semi-Fowlers in bed, eyes closed, opens on command. Follows commands on L. Good, strong cough. BBS clear, FNL, symmetrical. No Stridor noted. SpO2 >93% on room air. VSS. Appears comfortable. Denies pain or difficulty breathing.  Pt has not voided. Condom cath in place. Bladder scan ~300cc.   P: Supplemental O2 prn for SPO2 >/=92%, NTS prn. Place foley. Ongoing monitoring.   Francine Graven, MSN, AGACNP  Pager [phone] or if no answer [phone] [facility] Pulmonary & Critical Care

## 2019-04-15 NOTE — Anesthesia Postprocedure Evaluation (Signed)
Anesthesia Post Note  Patient: Rodney Collier  Procedure(s) Performed: IR WITH ANESTHESIA (N/A )     Patient location during evaluation: ICU Anesthesia Type: General Level of consciousness: sedated and patient remains intubated per anesthesia plan Pain management: pain level controlled Vital Signs Assessment: post-procedure vital signs reviewed and stable Respiratory status: patient remains intubated per anesthesia plan Cardiovascular status: stable Anesthetic complications: no    Last Vitals:  Vitals:   04/15/19 0700 04/15/19 0715  BP: (!) 145/84 130/75  Pulse: 66 62  Resp: 15 15  Temp:  36.7 C  SpO2: 100% 100%    Last Pain:  Vitals:   04/15/19 0715  TempSrc: Axillary  PainSc:                  Rodney Collier

## 2019-04-15 NOTE — Progress Notes (Signed)
Referring Physician(s): Code Stroke- Caryl PinaLindzen, Eric  Supervising Physician: Julieanne Cottoneveshwar, Sanjeev  Patient Status:  [facility] - In-pt  Chief Complaint: None- intubated with sedation  Subjective:  Left PCA occlusion s/p emergent mechanical thrombectomy achieving a TICI 3 revascularization 04/14/2019 by Dr. Corliss Skainseveshwar. Patient laying in bed intubated with sedation undergoing EEG. He opens eyes to voice but does not follow commands. No spontaneous movements of extremities. Right groin incision c/d/i.  MR brain 04/15/2019: 1. Acute infarction throughout the left PCA territory (including most of the left thalamus), left more than right cerebellum, and right pons. Petechial hemorrhage is seen at the level of the pons. 2. Prior left frontal infarcts and microvascular ischemic gliosis.   Allergies: Patient has no known allergies.  Medications: Prior to Admission medications   Not on File     Vital Signs: BP 128/76    Pulse (!) 56    Temp 98.1 F (36.7 C) (Axillary)    Resp 15    Ht 6' (1.829 m)    Wt 171 lb 1.2 oz (77.6 kg)    SpO2 100%    BMI 23.20 kg/m   Physical Exam Vitals signs and nursing note reviewed.  Constitutional:      General: He is not in acute distress.    Comments: Intubated with sedation.  Pulmonary:     Effort: Pulmonary effort is normal. No respiratory distress.     Comments: Intubated with sedation. Skin:    General: Skin is warm and dry.     Comments: Right groin incision soft without active bleeding or hematoma.  Neurological:     Comments: Intubated with sedation. He opens eyes to voice but does not follow commands. PERRL bilaterally. No spontaneous movements of extremities. Distal pulses palpable bilaterally with Doppler.  Psychiatric:     Comments: Intubated with sedation.     Imaging: Ct Code Stroke Cta Head W/wo Contrast  Result Date: 04/14/2019 CLINICAL DATA:  Right-sided deficits EXAM: CT ANGIOGRAPHY HEAD AND NECK CT PERFUSION BRAIN TECHNIQUE:  Multidetector CT imaging of the head and neck was performed using the standard protocol during bolus administration of intravenous contrast. Multiplanar CT image reconstructions and MIPs were obtained to evaluate the vascular anatomy. Carotid stenosis measurements (when applicable) are obtained utilizing NASCET criteria, using the distal internal carotid diameter as the denominator. Multiphase CT imaging of the brain was performed following IV bolus contrast injection. Subsequent parametric perfusion maps were calculated using RAPID software. CONTRAST:  Dose is currently not available COMPARISON:  None. FINDINGS: CTA NECK FINDINGS Aortic arch: 2 vessel branching.  No acute finding Right carotid system: Minimal calcified plaque at the bifurcation/bulb. No ulceration or stenosis. Left carotid system: Probable, minimal low-density plaque at the bulb. No stenosis or ulceration. Vertebral arteries: No proximal subclavian stenosis. Borderline left V1 segment narrowing is favored artifactual. Vessels are widely patent to the dura. Skeleton: No acute or aggressive finding. Poor dentition with bilateral maxillary and right mandibular periapical erosions. Other neck: No acute finding Upper chest: No acute finding Review of the MIP images confirms the above findings CTA HEAD FINDINGS Anterior circulation: No major branch occlusion. There is atherosclerotic irregularity of bilateral MCA branches. Dominant right A1 segment Posterior circulation: Mild narrowing of the mid basilar. The left PCA is not seen beyond the P2 segment with subsequent reconstitution. Negative for aneurysm. Venous sinuses: Patent Anatomic variants: None significant Review of the MIP images confirms the above findings Case discussed in progress with Dr. Otelia LimesLindzen CT Brain Perfusion Findings: RAPID software  has failed such that perfusion maps were generated on TeraRecon software by myself. Images were sent to PACS. There is prolonged mean transit time within  the left occipital lobe with relatively preserved blood volume. IMPRESSION: 1. Left P2 occlusion with downstream reconstitution. Qualitative assessment of CT perfusion maps shows ischemia without visible core infarct in the left occipital lobe. 2. Mild atherosclerosis in the neck without flow limiting stenosis or embolic source seen. 3. No large vessel occlusion in the anterior circulation. 4. Mild mid basilar narrowing. Electronically Signed   By: Marnee Spring M.D.   On: 04/14/2019 11:02   Ct Code Stroke Cta Neck W/wo Contrast  Result Date: 04/14/2019 CLINICAL DATA:  Right-sided deficits EXAM: CT ANGIOGRAPHY HEAD AND NECK CT PERFUSION BRAIN TECHNIQUE: Multidetector CT imaging of the head and neck was performed using the standard protocol during bolus administration of intravenous contrast. Multiplanar CT image reconstructions and MIPs were obtained to evaluate the vascular anatomy. Carotid stenosis measurements (when applicable) are obtained utilizing NASCET criteria, using the distal internal carotid diameter as the denominator. Multiphase CT imaging of the brain was performed following IV bolus contrast injection. Subsequent parametric perfusion maps were calculated using RAPID software. CONTRAST:  Dose is currently not available COMPARISON:  None. FINDINGS: CTA NECK FINDINGS Aortic arch: 2 vessel branching.  No acute finding Right carotid system: Minimal calcified plaque at the bifurcation/bulb. No ulceration or stenosis. Left carotid system: Probable, minimal low-density plaque at the bulb. No stenosis or ulceration. Vertebral arteries: No proximal subclavian stenosis. Borderline left V1 segment narrowing is favored artifactual. Vessels are widely patent to the dura. Skeleton: No acute or aggressive finding. Poor dentition with bilateral maxillary and right mandibular periapical erosions. Other neck: No acute finding Upper chest: No acute finding Review of the MIP images confirms the above findings CTA  HEAD FINDINGS Anterior circulation: No major branch occlusion. There is atherosclerotic irregularity of bilateral MCA branches. Dominant right A1 segment Posterior circulation: Mild narrowing of the mid basilar. The left PCA is not seen beyond the P2 segment with subsequent reconstitution. Negative for aneurysm. Venous sinuses: Patent Anatomic variants: None significant Review of the MIP images confirms the above findings Case discussed in progress with Dr. Otelia Limes CT Brain Perfusion Findings: RAPID software has failed such that perfusion maps were generated on TeraRecon software by myself. Images were sent to PACS. There is prolonged mean transit time within the left occipital lobe with relatively preserved blood volume. IMPRESSION: 1. Left P2 occlusion with downstream reconstitution. Qualitative assessment of CT perfusion maps shows ischemia without visible core infarct in the left occipital lobe. 2. Mild atherosclerosis in the neck without flow limiting stenosis or embolic source seen. 3. No large vessel occlusion in the anterior circulation. 4. Mild mid basilar narrowing. Electronically Signed   By: Marnee Spring M.D.   On: 04/14/2019 11:02   [name]  Result Date: 04/15/2019 CLINICAL DATA:  Focal neuro deficit. EXAM: MRI HEAD WITHOUT CONTRAST TECHNIQUE: Multiplanar, multiecho pulse sequences of the brain and surrounding structures were obtained without intravenous contrast. COMPARISON:  CT and CTA from yesterday FINDINGS: Brain: Restricted diffusion throughout the left occipital and posteromedial left temporal lobes. Acute infarct in the left thalamus extending towards the upper midbrain. Patchy acute infarction in the left more than right cerebellum and in the right pons. No superimposed hemorrhage. Small to moderate remote anterior left frontal infarct. Chronic lacune in the paramedian left frontal white matter anteriorly. No hydrocephalus, mass, or collection.  Normal brain volume.  Vascular: Normal flow voids. Skull and upper cervical spine: Negative for marrow lesion Sinuses/Orbits: Patchy mucosal thickening in the paranasal sinuses. Nasal cavity fluid in the setting of intubation. IMPRESSION: 1. Acute infarction throughout the left PCA territory (including most of the left thalamus), left more than right cerebellum, and right pons. Petechial hemorrhage is seen at the level of the pons. 2. Prior left frontal infarcts and microvascular ischemic gliosis. Electronically Signed   By: Monte Fantasia M.D.   On: 04/15/2019 05:07   Ct Code Stroke Cta Cerebral Perfusion W/wo Contrast  Result Date: 04/14/2019 CLINICAL DATA:  Right-sided deficits EXAM: CT ANGIOGRAPHY HEAD AND NECK CT PERFUSION BRAIN TECHNIQUE: Multidetector CT imaging of the head and neck was performed using the standard protocol during bolus administration of intravenous contrast. Multiplanar CT image reconstructions and MIPs were obtained to evaluate the vascular anatomy. Carotid stenosis measurements (when applicable) are obtained utilizing NASCET criteria, using the distal internal carotid diameter as the denominator. Multiphase CT imaging of the brain was performed following IV bolus contrast injection. Subsequent parametric perfusion maps were calculated using RAPID software. CONTRAST:  Dose is currently not available COMPARISON:  None. FINDINGS: CTA NECK FINDINGS Aortic arch: 2 vessel branching.  No acute finding Right carotid system: Minimal calcified plaque at the bifurcation/bulb. No ulceration or stenosis. Left carotid system: Probable, minimal low-density plaque at the bulb. No stenosis or ulceration. Vertebral arteries: No proximal subclavian stenosis. Borderline left V1 segment narrowing is favored artifactual. Vessels are widely patent to the dura. Skeleton: No acute or aggressive finding. Poor dentition with bilateral maxillary and right mandibular periapical erosions. Other neck: No acute finding Upper chest: No  acute finding Review of the MIP images confirms the above findings CTA HEAD FINDINGS Anterior circulation: No major branch occlusion. There is atherosclerotic irregularity of bilateral MCA branches. Dominant right A1 segment Posterior circulation: Mild narrowing of the mid basilar. The left PCA is not seen beyond the P2 segment with subsequent reconstitution. Negative for aneurysm. Venous sinuses: Patent Anatomic variants: None significant Review of the MIP images confirms the above findings Case discussed in progress with Dr. Cheral Marker CT Brain Perfusion Findings: RAPID software has failed such that perfusion maps were generated on TeraRecon software by myself. Images were sent to PACS. There is prolonged mean transit time within the left occipital lobe with relatively preserved blood volume. IMPRESSION: 1. Left P2 occlusion with downstream reconstitution. Qualitative assessment of CT perfusion maps shows ischemia without visible core infarct in the left occipital lobe. 2. Mild atherosclerosis in the neck without flow limiting stenosis or embolic source seen. 3. No large vessel occlusion in the anterior circulation. 4. Mild mid basilar narrowing. Electronically Signed   By: Monte Fantasia M.D.   On: 04/14/2019 11:02   Dg Chest Port 1 View  Result Date: 04/15/2019 CLINICAL DATA:  Stroke.  Endotracheal tube placement. EXAM: PORTABLE CHEST 1 VIEW COMPARISON:  Radiographs of April 14, 2019. FINDINGS: The heart size and mediastinal contours are within normal limits. Endotracheal and nasogastric tubes are in grossly good position. No pneumothorax or pleural effusion is noted. Both lungs are clear. The visualized skeletal structures are unremarkable. IMPRESSION: Endotracheal and nasogastric tubes in good position. No acute cardiopulmonary abnormality seen. Electronically Signed   By: Marijo Conception M.D.   On: 04/15/2019 07:11   Dg Chest Port 1 View  Result Date: 04/14/2019 CLINICAL DATA:  Stroke EXAM: PORTABLE  CHEST 1 VIEW COMPARISON:  None. FINDINGS: Endotracheal tube in good position.  NG tube in  the stomach. Heart size mildly enlarged with vascular congestion. No edema or effusion. IMPRESSION: Endotracheal tube in good position. Cardiac enlargement with mild vascular congestion. Electronically Signed   By: Marlan Palauharles  Clark M.D.   On: 04/14/2019 15:03   Dg Abd Portable 1v  Result Date: 04/14/2019 CLINICAL DATA:  Orogastric tube placement. EXAM: PORTABLE ABDOMEN - 1 VIEW COMPARISON:  None. FINDINGS: OG tube appears adequately positioned in the stomach with tip directed towards the stomach antrum/pylorus. Visualized bowel gas pattern is nonobstructive. No evidence of free intraperitoneal air. IMPRESSION: OG tube adequately positioned in the stomach with tip directed towards the stomach antrum/pylorus. Electronically Signed   By: Bary RichardStan  Maynard M.D.   On: 04/14/2019 15:02   Ct Head Code Stroke Wo Contrast  Result Date: 04/14/2019 CLINICAL DATA:  Code stroke. Cerebral hemorrhage suspected. Possible stroke right-sided deficits EXAM: CT HEAD WITHOUT CONTRAST TECHNIQUE: Contiguous axial images were obtained from the base of the skull through the vertex without intravenous contrast. COMPARISON:  None. FINDINGS: Brain: Chronic appearing small left frontal infarct involving cortex and white matter. Small chronic appearing left ACA distribution infarct in the paramedian subcortical white matter extending towards the corpus callosum. No hemorrhage, hydrocephalus, or masslike finding. Solitary calcification along the right frontal cortex, nonspecific. Vascular: Negative Skull: Periapical erosions around left maxillary teeth. No acute finding Sinuses/Orbits: No acute finding Other: These results were communicated to Dr. Otelia LimesLindzen at 10:32 amon 8/27/2020by text page via the [facility] messaging system. ASPECTS [facility]([place] Stroke Program Early CT Score) - Ganglionic level infarction (caudate, lentiform nuclei, internal capsule, insula,  M1-M3 cortex): 7 - Supraganglionic infarction (M4-M6 cortex): 3 -when accounting for chronic infarct Total score (0-10 with 10 being normal): 10 IMPRESSION: 1. No acute finding. 2. Small remote appearing infarcts in the left frontal lobe. Electronically Signed   By: Marnee SpringJonathon  Watts M.D.   On: 04/14/2019 10:33    Labs:  CBC: Recent Labs    04/14/19 1015 04/14/19 1024 04/14/19 1438 04/15/19 0545 04/15/19 0703  WBC 6.4  --   --   --  10.1  HGB 12.7* 14.6 12.9* 12.2* 12.3*  HCT 40.7 43.0 38.0* 36.0* 40.6  PLT 249  --   --   --  254    COAGS: Recent Labs    04/14/19 1015  INR 1.0  APTT 27    BMP: Recent Labs    04/14/19 1015 04/14/19 1024 04/14/19 1438 04/15/19 0545 04/15/19 0703  NA 134* 135 137 140 138  K 3.7 3.7 3.7 3.9 4.8  CL 98 97*  --   --  109  CO2 23  --   --   --  17*  GLUCOSE 342* 327*  --   --  151*  BUN 11 12  --   --  10  CALCIUM 9.2  --   --   --  8.9  CREATININE 0.92 0.70  --   --  0.82  GFRNONAA >60  --   --   --  >60  GFRAA >60  --   --   --  >60    LIVER FUNCTION TESTS: Recent Labs    04/14/19 1015  BILITOT 0.8  AST 23  ALT 23  ALKPHOS 63  PROT 6.9  ALBUMIN 3.5    Assessment and Plan:  Left PCA occlusion s/p emergent mechanical thrombectomy achieving a TICI 3 revascularization 04/14/2019 by Dr. Corliss Skainseveshwar. Patient's condition stable- remains intubated/sedated, opens eyes to voice but does not follow commands, no spontaneous movement of extremities.  Right groin incision stable, distal pulses palpable bilaterally with Doppler. Further plans per neurology/CCM- appreciate and agree with management. NIR to follow.   Electronically Signed: Elwin Mocha, PA-C 04/15/2019, 10:50 AM   I spent a total of 25 Minutes at the the patient's bedside AND on the patient's hospital floor or unit, greater than 50% of which was counseling/coordinating care for left PCA occlusion s/p revascularization.

## 2019-04-15 NOTE — Progress Notes (Signed)
Clarified BP parameters with Dr. Erlinda Hong. Goal BP<180/105.

## 2019-04-15 NOTE — Procedures (Addendum)
Patient Name: Rodney Collier  MRN: [id number]  Epilepsy Attending: Lora Havens  Referring Physician/Provider: Dr Rosalin Hawking Date: 04/15/2019 Duration: 23.22mins  Patient history: 53 year old male with acute infarction of the left PCA territory, prior left frontal infarct who had seizure-like activity overnight EEG to evaluate for seizure.  Level of alertness: Awake, drowsy  AEDs during EEG study: Propofol  Technical aspects: This EEG study was done with scalp electrodes positioned according to the 10-20 International system of electrode placement. Electrical activity was acquired at a sampling rate of 500Hz  and reviewed with a high frequency filter of 70Hz  and a low frequency filter of 1Hz . EEG data were recorded continuously and digitally stored.   Description: The posterior dominant rhythm consists of 8-9 Hz activity of moderate voltage (25-35 uV) seen predominantly in posterior head regions, asymmetric ( decreased left) and reactive to eye opening and eye closing. Drowsiness was characterized by attenuation of the posterior background rhythm.  There was also continuous generalized 3 to 6 Hz theta delta slowing, maximal left hemisphere. Hyperventilation and photic stimulation were not performed.  IMPRESSION: This study is suggestive of cortical dysfunction in the left hemisphere secondary to underlying stroke as well as mild diffuse encephalopathy. No seizures or definite epileptiform discharges were seen throughout the recording.  Ozil Stettler Barbra Sarks

## 2019-04-15 NOTE — Progress Notes (Signed)
Echocardiogram 2D Echocardiogram has been performed.  Oneal Deputy Venna Berberich 04/15/2019, 11:02 AM

## 2019-04-15 NOTE — Progress Notes (Addendum)
RT and RN transported pt from 4N15 to MRI and back without complication. Pt respiratory status stable at this time. RT will continue to monitor.

## 2019-04-15 NOTE — Plan of Care (Signed)
°  Problem: Coping: °Goal: Level of anxiety will decrease °Outcome: Progressing °  °

## 2019-04-15 NOTE — Progress Notes (Signed)
EEG Completed; Results Pending  

## 2019-04-15 NOTE — Progress Notes (Signed)
Arterial line no longer transducing appropriately, unable to draw blood back. Assessed by RT and subsequently removed. Will go by cuff pressures for BP.

## 2019-04-16 ENCOUNTER — Inpatient Hospital Stay (HOSPITAL_COMMUNITY): Payer: Medicaid - Out of State

## 2019-04-16 DIAGNOSIS — Z9289 Personal history of other medical treatment: Secondary | ICD-10-CM

## 2019-04-16 LAB — COMPREHENSIVE METABOLIC PANEL
ALT: 17 U/L (ref 0–44)
AST: 21 U/L (ref 15–41)
Albumin: 3.2 g/dL — ABNORMAL LOW (ref 3.5–5.0)
Alkaline Phosphatase: 65 U/L (ref 38–126)
Anion gap: 11 (ref 5–15)
BUN: 8 mg/dL (ref 6–20)
CO2: 23 mmol/L (ref 22–32)
Calcium: 8.8 mg/dL — ABNORMAL LOW (ref 8.9–10.3)
Chloride: 106 mmol/L (ref 98–111)
Creatinine, Ser: 0.83 mg/dL (ref 0.61–1.24)
GFR calc Af Amer: >60 mL/min (ref >60)
GFR calc non Af Amer: >60 mL/min (ref >60)
Glucose, Bld: 114 mg/dL — ABNORMAL HIGH (ref 70–99)
Potassium: 3.6 mmol/L (ref 3.5–5.1)
Sodium: 140 mmol/L (ref 135–145)
Total Bilirubin: 1.1 mg/dL (ref 0.3–1.2)
Total Protein: 6.7 g/dL (ref 6.5–8.1)

## 2019-04-16 LAB — GLUCOSE, CAPILLARY
Glucose-Capillary: 121 mg/dL — ABNORMAL HIGH (ref 70–99)
Glucose-Capillary: 83 mg/dL (ref 70–99)
Glucose-Capillary: 113 mg/dL — ABNORMAL HIGH (ref 70–99)
Glucose-Capillary: 121 mg/dL — ABNORMAL HIGH (ref 70–99)
Glucose-Capillary: 103 mg/dL — ABNORMAL HIGH (ref 70–99)
Glucose-Capillary: 114 mg/dL — ABNORMAL HIGH (ref 70–99)

## 2019-04-16 LAB — ANTITHROMBIN III: AntiThromb III Func: 135 % — ABNORMAL HIGH (ref 75–120)

## 2019-04-16 LAB — TRIGLYCERIDES: Triglycerides: 118 mg/dL (ref <150)

## 2019-04-16 LAB — RPR: RPR Ser Ql: NONREACTIVE

## 2019-04-16 MED ORDER — CHLORHEXIDINE GLUCONATE 0.12 % MT SOLN
15.0000 mL | Freq: Two times a day (BID) | OROMUCOSAL | Status: DC
  Administered 2019-04-16 – 2019-04-18 (×4): 15 mL via OROMUCOSAL
  Filled 2019-04-16 (×2): qty 15

## 2019-04-16 MED ORDER — ORAL CARE MOUTH RINSE
15.0000 mL | Freq: Two times a day (BID) | OROMUCOSAL | Status: DC
  Administered 2019-04-16 – 2019-06-24 (×116): 15 mL via OROMUCOSAL

## 2019-04-16 NOTE — Progress Notes (Addendum)
STROKE TEAM PROGRESS NOTE   INTERVAL HISTORY Extubated, off sedation, not on cleviprex. He is able to follow some simple commands, not sure if there is any language barrier, expressive aphasia. BP stable at goal. No more seizure-like activity, loaded with kappra and EEG report without epileptiform activity.   Vitals:   04/16/19 0500 04/16/19 0600 04/16/19 0700 04/16/19 0800  BP: (!) 169/90 (!) 157/89 (!) 155/90 (!) 161/99  Pulse: 82 84 80 87  Resp: 18 19 19  (!) 22  Temp:    100.3 F (37.9 C)  TempSrc:    Axillary  SpO2: 94% 94% 95% 93%  Weight:      Height:        CBC:  Recent Labs  Lab 04/14/19 1015  04/15/19 0545 04/15/19 0703  WBC 6.4  --   --  10.1  NEUTROABS 4.7  --   --  7.0  HGB 12.7*   < > 12.2* 12.3*  HCT 40.7   < > 36.0* 40.6  MCV 72.9*  --   --  74.2*  PLT 249  --   --  254   < > = values in this interval not displayed.    Basic Metabolic Panel:  Recent Labs  Lab 04/15/19 0703 04/16/19 0616  NA 138 140  K 4.8 3.6  CL 109 106  CO2 17* 23  GLUCOSE 151* 114*  BUN 10 8  CREATININE 0.82 0.83  CALCIUM 8.9 8.8*  MG 1.8  --   PHOS 2.6  --    Lipid Panel:     Component Value Date/Time   CHOL 139 04/15/2019 0703   TRIG 118 04/16/2019 0616   HDL 39 (L) 04/15/2019 0703   CHOLHDL 3.6 04/15/2019 0703   VLDL 12 04/15/2019 0703   LDLCALC 88 04/15/2019 0703   HgbA1c:  Lab Results  Component Value Date   HGBA1C 12.3 (H) 04/15/2019   Urine Drug Screen: No results found for: LABOPIA, COCAINSCRNUR, LABBENZ, AMPHETMU, THCU, LABBARB  Alcohol Level No results found for: [facility]  IMAGING  [name] 04/15/2019 1. Acute infarction throughout the left PCA territory (including most of the left thalamus), left more than right cerebellum, and right pons. Petechial hemorrhage is seen at the level of the pons.  2. Prior left frontal infarcts and microvascular ischemic gliosis.   Cerebral angiogram 04/14/2019 4 vessel cerebral arteriogram followed by complete  revascularization of occluded Lt PCA  With x 2 passes with 19mm x 35mm embotrap retriever  device achieving a TICI 3 revascularization.  Ct Code Stroke Cta Head W/wo Contrast Ct Code Stroke Cta Neck W/wo Contrast Ct Code Stroke Cta Cerebral Perfusion W/wo Contrast 04/14/2019 1. Left P2 occlusion with downstream reconstitution. Qualitative assessment of CT perfusion maps shows ischemia without visible core infarct in the left occipital lobe. 2. Mild atherosclerosis in the neck without flow limiting stenosis or embolic source seen. 3. No large vessel occlusion in the anterior circulation. 4. Mild mid basilar narrowing.   Ct Head Code Stroke Wo Contrast 04/14/2019 1. No acute finding.  2. Small remote appearing infarcts in the left frontal lobe.    LE Doppler 04/15/2019 Summary: Right: There is no evidence of deep vein thrombosis in the lower extremity. No cystic structure found in the popliteal fossa. Left: There is no evidence of deep vein thrombosis in the lower extremity. No cystic structure found in the popliteal fossa.  2D Echocardiogram 04/15/2019 IMPRESSIONS  1. The left ventricle has normal systolic function, with an  ejection fraction of 55-60%. The cavity size was normal. Left ventricular diastolic parameters were normal. No evidence of left ventricular regional wall motion abnormalities.  2. The right ventricle has normal systolic function. The cavity was normal. There is no increase in right ventricular wall thickness.  3. The mitral valve is abnormal. Mild thickening of the mitral valve leaflet. The MR jet is posteriorly-directed.  4. The tricuspid valve is grossly normal.  5. The aortic valve is tricuspid. No stenosis of the aortic valve.  6. The aorta is normal unless otherwise noted.  7. The inferior vena cava was normal in size with <50% respiratory variability. SUMMARY LVEF 55-60%, normal wall thickness, normal wall motion, normal diastolic function, mild posteriorly directed  MR, normal size IVC that does not collapse, no PFO by color doppler  FINDINGS  Left Ventricle: The left ventricle has normal systolic function, with an ejection fraction of 55-60%. The cavity size was normal. There is no increase in left ventricular wall thickness. Left ventricular diastolic parameters were normal. Normal left ventricular filling pressures No evidence of left ventricular regional wall motion abnormalities..  EEG  04/15/2019 IMPRESSION: This study is suggestive of cortical dysfunction in the left hemisphere secondary to underlying stroke as well as mild diffuse encephalopathy. No seizures or definite epileptiform discharges were seen throughout the recording.  PHYSICAL EXAM  Temp:  [98.3 F (36.8 C)-100.5 F (38.1 C)] 100.3 F (37.9 C) (08/29 0800) Pulse Rate:  [61-105] 87 (08/29 0800) Resp:  [14-22] 22 (08/29 0800) BP: (120-169)/(66-99) 161/99 (08/29 0800) SpO2:  [92 %-100 %] 93 % (08/29 0800) FiO2 (%):  [30 %] 30 % (08/28 1946)  General - Well nourished, well developed, intubated off sedation.  Ophthalmologic - fundi not visualized due to noncooperation.  Cardiovascular - Regular rate and rhythm.  Neuro - extubated off sedation, eyes open, following most of the simple commands. Eyes mid position, able to have left gaze with right eye nystagmus. PERRL, blinking on the left. Corneal reflex present, gag and cough present. Right droop, tongue protrusion not cooperative, against gravity of LUE but significant ataxia, able to follow simple commands with left hand. lifts left leg slightly off of the bed on command, follow simple commands of the left foot. On pain stimulation, slight withdraw of RLE but no movement of RUE. DTR 1+ and no babinski. Sensation and gait not tested.   ASSESSMENT/PLAN [name] with no significant past medical history presenting with right-sided weakness, decreased sensation on the right, diplopia and headache. Complete  revascularization of occluded L PCA w/ mechanical thrombectomy.  Stroke: bilateral embolic infarcts most significant in the left P2 territory with P2 occlusion s/p IR with TICI3 reperfusion - embolic secondary to unknown source, likely hypercoagulable state associated with severe DM and hyperglycemia  Code Stroke CT head No acute abnormality. Old infarct L frontal lobe.   CTA head & neck L P2 occlusion. Mild neck atherosclerosis . No LVO anterior circulation. Mild mid BA narrowing.  CT perfusion ischemia w/o visible core  Cerebral angio TICI reperfusion of occluded L PCA  Post IR CT brain no ICH.  MRI  L PCA (including thalamus), L>R cerebellum, R pontine and left midbrain infarct. Petechial hemorrhage seen in pons. Old L frontal infarcts. Small vessel disease.   LE Doppler no DVT - no evidence of deep vein thrombosis   2D Echo - EF 55-60%.  No cardiac source of emboli identified.   Tentative TEE on Monday   UDS -  pending   EEG - suggestive of cortical dysfunction in the left hemisphere secondary to underlying stroke as well as mild diffuse encephalopathy. No seizures or definite epileptiform discharges were seen throughout the recording.  LDL 88  HgbA1c 12.3  Hypercoagulable labs - pending [Anti thrombin III Function - 135% (H) normal 75 - 120%]  SCDs for VTE prophylaxis  No antithrombotic prior to admission, now on aspirin 325 mg daily.   Therapy recommendations:  pending   Disposition:  pending   VDRF  Too somnolent with apnea to extubate post procedure  CCM on board  Plan for extubation attempt in am  Possible seizure  Full body rigid shaking hours post IR, L>R  Loaded with keppra  EEG - suggestive of cortical dysfunction in the left hemisphere secondary to underlying stroke as well as mild diffuse encephalopathy. No seizures or definite epileptiform discharges were seen throughout the recording.  Continue keppra 500mg  bid  Blood Pressure  On cleviprex  gtt for BP contron post IR, now off . BP goals 120-140 x 24h post IR . Long-term BP goal normotensive  Hyperlipidemia  Home meds:  No statin  LDL 88, goal < 70  Add lipitor 20  Continue statin at discharge  Diabetes type II, new diagnosis, Uncontrolled  HgbA1c 12.3, goal < 7.0  CBGs  SSI  Hyperglycemia, improved   On levemir  Likely hypercoagulable condition associated with uncontrolled DM  Dysphagia . Secondary to stroke . NPO . Speech once extubated . May consider TF if not able to extubate tomorrow   Other Stroke Risk Factors   Small remote appearing infarcts in the left frontal lobe by CT and MRI  Other Active Problems  Pt self extubated yesterday (04/15/19) - CCM on board  S/P Complete revascularization of occluded L PCA w/ mechanical thrombectomy 04/14/2019  Hospital day # 2  This patient is critically ill due to embolic stroke, s/p IR thrombectomy, hyperglycemia, intubated, seizure activity and at significant risk of neurological worsening, death form recurrent stroke, DKA, status epilepticus, hemorrhagic conversion. This patient's care requires constant monitoring of vital signs, hemodynamics, respiratory and cardiac monitoring, review of multiple databases, neurological assessment, discussion with family, other specialists and medical decision making of high complexity. I spent 30 minutes of neurocritical care time in the care of this patient.  Personally examined patient and images, and have participated in and made any corrections needed to history, physical, neuro exam,assessment and plan as stated above.  I have personally obtained the history, evaluated lab date, reviewed imaging studies and agree with radiology interpretations.    Rodney DeanAntonia Jakyra Kenealy, MD Stroke Neurology    To contact Stroke Continuity provider, please refer to [email or web address]. After hours, contact General Neurology

## 2019-04-16 NOTE — Progress Notes (Addendum)
If needed, the patient's car was towed to [employer]

## 2019-04-16 NOTE — Progress Notes (Signed)
Notified Dr. Jaynee Eagles regarding higher NIH. Patient remains lethargic, right arm flaccid and not nodding yes/no appropriately. Stat CT done.

## 2019-04-16 NOTE — Progress Notes (Signed)
NAME:  Rodney SinclairSamuel Magouirk, MRN:  [id number], DOB:  1966/08/14, LOS: 2 ADMISSION DATE:  04/14/2019, CONSULTATION DATE:  04/14/2019 REFERRING MD:  Neuro IR - Devashwar, CHIEF COMPLAINT:  AMS   Brief History   53 year old male presents 8/27 with no known PMH presenting dysarthric with last seen normal at midnight the day prior to presentation and taken to IR.  PCCM consulted for vent management.  No further history is available.  Past Medical History  As above  Significant Hospital Events   8/27 NIR for revascularization of occluded PCA  Consults:  PCCM NIR  Procedures:  ETT 8/27>>>self-extubated ~2300 8/28 PIV 8/27>>>  Significant Diagnostic Tests:  8/27 CT H> remote L frontal infarcts 8/27 CTA head/neck> L P2 occlusion 8/28 MRI Brain> acute L PCA infarct including much of L thalamus, L>R cerebellum, R pons. Petechial hemorrhage at level of pons. Prior L frontal infarcts. 8/28 CXR> no acute cardiopulmonary abnormality. ETT NGT in stable position   Micro Data:  8/27 SARS CoV2> neg   Antimicrobials:  N/A   Interim history/subjective:  Overnight self-extubated; protecting airway and following most commands (R side paretic)  Cleviprex and insulin gtt weaned off 8/28    Objective   Blood pressure (!) 161/99, pulse 87, temperature 100.3 F (37.9 C), temperature source Axillary, resp. rate (!) 22, height 6' (1.829 m), weight 77.6 kg, SpO2 93 %.    Vent Mode: PRVC FiO2 (%):  [30 %] 30 % Set Rate:  [15 bmp] 15 bmp Vt Set:  [620 mL] 620 mL PEEP:  [5 cmH20] 5 cmH20 Plateau Pressure:  [20 cmH20-22 cmH20] 20 cmH20   Intake/Output Summary (Last 24 hours) at 04/16/2019 0849 Last data filed at 04/16/2019 0600 Gross per 24 hour  Intake 1897.42 ml  Output 1060 ml  Net 837.42 ml   Filed Weights   04/14/19 1500  Weight: 77.6 kg    Examination: General: ill appearing adult male,  NAD  HENT: [place]/AT; midline gaze,  pink mmm; anicteric sclera  Lungs: CTA bilaterally. Symmetrical chest  expansion, no accessory muscle use  Cardiovascular: RRR s1s2 no r/m/g. Cap refill >2 sec, 2+ radial pulses  Abdomen: soft, flat, ndnt, + bowel sounds  Extremities: Symmetrical bulk and tone, no obvious deformity. No cyanosis no clubbing no edema  Neuro:  CN II, III, IV, VI appear intact. No nystagmus. eybrow raise only on left side. Pt swallows on request. Left shoulder shrug, unable to move R side. Unable to stick tongue out, but appears midline at rest, w/o fasciculation.  Moves LUE LLE to command unable to move/squeeze/withdraw right. 3-4 beat right ankle clonus Skin: Cool RUE RLE distally. Skin is clean, dry, without rash   Resolved Hospital Problem list   N/A  Assessment & Plan:   Acute respiratory failure requiring intubation -extubated self overnight, protecting airway -continue pulm hygiene    Acute encephalopathy in setting of CVA -L PCA infarct involving much of L pons, L>R cerebellum, R pons  -Overnight concern for seizure P -CVA per neuro -Keppra load overnight per neuro, EEG to be completed this morning  HTN P -SBP goal per NIR -arterial line dc overnight, continue cuff pressures  -Cleviprex for BP  DM P -SSI  Malnutrition, at risk P -swallow test today -PT/OT to eval   Still looking for family to update.    Labs   CBC: Recent Labs  Lab 04/14/19 1015 04/14/19 1024 04/14/19 1438 04/15/19 0545 04/15/19 0703  WBC 6.4  --   --   --  10.1  NEUTROABS 4.7  --   --   --  7.0  HGB 12.7* 14.6 12.9* 12.2* 12.3*  HCT 40.7 43.0 38.0* 36.0* 40.6  MCV 72.9*  --   --   --  74.2*  PLT 249  --   --   --  811    Basic Metabolic Panel: Recent Labs  Lab 04/14/19 1015 04/14/19 1024 04/14/19 1438 04/15/19 0545 04/15/19 0703 04/16/19 0616  NA 134* 135 137 140 138 140  K 3.7 3.7 3.7 3.9 4.8 3.6  CL 98 97*  --   --  109 106  CO2 23  --   --   --  17* 23  GLUCOSE 342* 327*  --   --  151* 114*  BUN 11 12  --   --  10 8  CREATININE 0.92 0.70  --   --  0.82  0.83  CALCIUM 9.2  --   --   --  8.9 8.8*  MG  --   --   --   --  1.8  --   PHOS  --   --   --   --  2.6  --    GFR: Estimated Creatinine Clearance: 114.3 mL/min (by C-G formula based on SCr of 0.83 mg/dL). Recent Labs  Lab 04/14/19 1015 04/15/19 0703  WBC 6.4 10.1    Liver Function Tests: Recent Labs  Lab 04/14/19 1015 04/16/19 0616  AST 23 21  ALT 23 17  ALKPHOS 63 65  BILITOT 0.8 1.1  PROT 6.9 6.7  ALBUMIN 3.5 3.2*   No results for input(s): LIPASE, AMYLASE in the last 168 hours. No results for input(s): AMMONIA in the last 168 hours.  ABG    Component Value Date/Time   PHART 7.453 (H) 04/15/2019 0545   PCO2ART 27.8 (L) 04/15/2019 0545   PO2ART 93.0 04/15/2019 0545   HCO3 19.4 (L) 04/15/2019 0545   TCO2 20 (L) 04/15/2019 0545   ACIDBASEDEF 3.0 (H) 04/15/2019 0545   O2SAT 98.0 04/15/2019 0545     Coagulation Profile: Recent Labs  Lab 04/14/19 1015  INR 1.0    Cardiac Enzymes: No results for input(s): CKTOTAL, CKMB, CKMBINDEX, TROPONINI in the last 168 hours.  HbA1C: Hgb A1c MFr Bld  Date/Time Value Ref Range Status  04/15/2019 07:03 AM 12.3 (H) 4.8 - 5.6 % Final    Comment:    (NOTE) Pre diabetes:          5.7%-6.4% Diabetes:              >6.4% Glycemic control for   <7.0% adults with diabetes   04/14/2019 08:30 PM 12.3 (H) 4.8 - 5.6 % Final    Comment:    (NOTE) Pre diabetes:          5.7%-6.4% Diabetes:              >6.4% Glycemic control for   <7.0% adults with diabetes     CBG: Recent Labs  Lab 04/15/19 1542 04/15/19 1924 04/15/19 2337 04/16/19 0323 04/16/19 0747  GLUCAP 104* 109* 153* 121* 83      CRITICAL CARE  Total critical care time: 35 minutes  Critical care time was exclusive of separately billable procedures and treating other patients.  Critical care was necessary to treat or prevent imminent or life-threatening deterioration.  Critical care was time spent personally by me on the following activities:  development of treatment plan with patient and/or surrogate as well as nursing, discussions  with consultants, evaluation of patient's response to treatment, examination of patient, obtaining history from patient or surrogate, ordering and performing treatments and interventions, ordering and review of laboratory studies, ordering and review of radiographic studies, pulse oximetry and re-evaluation of patient's condition.  04/16/2019, 8:49 AM

## 2019-04-16 NOTE — Progress Notes (Signed)
CSW received consult to find family contact. CSW is currently searching resources to find family relations or find phone number for emergency contact.  Lamonte Richer, LCSW, [place] Worker II [phone]

## 2019-04-16 NOTE — Evaluation (Signed)
Speech Language Pathology Evaluation Patient Details Name: Rodney Collier MRN: [id number] DOB: 19-Nov-1965 Today's Date: 04/16/2019 Time: 2423-5361 SLP Time Calculation (min) (ACUTE ONLY): 10 min  Problem List:  Patient Active Problem List   Diagnosis Date Noted  . History of ETT   . Acute ischemic stroke ([place]) 04/14/2019  . Embolism of posterior cerebral artery, left 04/14/2019   Past Medical History: No past medical history on file.  HPI:  Pt is a 53 y.o. M with no known PMH who presents with right sided weakness, decreased sensation on right, diplopia and headache. CT showing remote L frontal infarcts, CTA head/neck with L P2 occlusion, MRI with acute L PCA infarct including much of L thalamus, L > R cerebellum, R pons. Now s/p complete revascularization of occluded L PCA with mechanical thrombectomy. ETT 8/27-8/28; self extubated 8/28.   Assessment / Plan / Recommendation Clinical Impression  Patient presents with a mod-severe global aphasia. He is able to follow basic verbal commands to complete oral motor assessment and did nod for yes and shake for no when asked some biographical questions, but not able to verify all info due to no family present. He was aphonic and did not make any attempts to vocalize, verbalize and no oral-motor movements or groping behaviors. He was alert and attentive but currently presents with focused attention only.  He did not make any spontaneous attempts to communicate verbally or nonverbally with RN or SLP.    SLP Assessment  SLP Recommendation/Assessment: Patient needs continued Speech Lanaguage Pathology Services SLP Visit Diagnosis: Dysphagia, unspecified (R13.10)    Follow Up Recommendations  Inpatient Rehab;24 hour supervision/assistance    Frequency and Duration min 3x week  2 weeks      SLP Evaluation Cognition  Overall Cognitive Status: Impaired/Different from baseline Arousal/Alertness: Awake/alert Orientation Level: Disoriented  X4 Attention: Focused Focused Attention: Appears intact Memory: Impaired Memory Impairment: Other (comment)(unable to assess)       Comprehension  Auditory Comprehension Overall Auditory Comprehension: Impaired Yes/No Questions: Impaired Basic Biographical Questions: Other (comment)(no family to verify) Commands: Impaired One Step Basic Commands: 50-74% accurate Conversation: Other (comment)    Expression Expression Primary Mode of Expression: Other (comment) Verbal Expression Overall Verbal Expression: Impaired Initiation: Impaired Non-Verbal Means of Communication: Other (comment)(head nod, shake)   Oral / Motor  Oral Motor/Sensory Function Overall Oral Motor/Sensory Function: Moderate impairment Facial ROM: Reduced right;Suspected CN VII (facial) dysfunction Facial Symmetry: Abnormal symmetry right;Suspected CN VII (facial) dysfunction Facial Strength: Reduced right Lingual ROM: Reduced right Lingual Symmetry: Abnormal symmetry right Lingual Strength: Reduced Motor Speech Phonation: Aphonic   GO                    Dannial Monarch 04/16/2019, 6:21 PM   Sonia Baller, MA, CCC-SLP Speech Therapy [facility] Acute Rehab

## 2019-04-16 NOTE — Progress Notes (Signed)
Rehab Admissions Coordinator Note:  Per PT recommendation, patient was screened by Michel Santee for appropriateness for an Inpatient Acute Rehab Consult.  At this time, we are recommending Inpatient Rehab consult.  Michel Santee 04/16/2019, 5:21 PM  I can be reached at [phone].

## 2019-04-16 NOTE — Progress Notes (Signed)
Family contacted and received information!  Islam Eichinger (daughter) [phone] Rashaad Hallstrom (wife; separated but still legal) [phone]  Patient does not have a current address in [place]. Address for [place] (where family is): [address] [place] [address]  Per their report: Jacquel "[name]" Brandis is from [place]. He speaks [place] (fluent) and [place]. They don't know of any past medical history, as he "does not like hospitals or doctors". But he does wear reading glasses, as needed.  The daughter, [name], is concerned that if she comes down here, she would not have a place to stay. I placed a social work consult to see if they could assist  .

## 2019-04-16 NOTE — Plan of Care (Signed)
  Problem: Nutrition: Goal: Risk of aspiration will decrease Outcome: Progressing   

## 2019-04-16 NOTE — Evaluation (Signed)
Clinical/Bedside Swallow Evaluation Patient Details  Name: Rodney Collier MRN: [id number] Date of Birth: Apr 27, 1966  Today's Date: 04/16/2019 Time: SLP Start Time (ACUTE ONLY): 14 SLP Stop Time (ACUTE ONLY): 1645 SLP Time Calculation (min) (ACUTE ONLY): 15 min  Past Medical History: No past medical history on file.  HPI:  Pt is a 53 y.o. M with no known PMH who presents with right sided weakness, decreased sensation on right, diplopia and headache. CT showing remote L frontal infarcts, CTA head/neck with L P2 occlusion, MRI with acute L PCA infarct including much of L thalamus, L > R cerebellum, R pons. Now s/p complete revascularization of occluded L PCA with mechanical thrombectomy. ETT 8/27-8/28; self extubated 8/28.   Assessment / Plan / Recommendation Clinical Impression  Patient presents with a severe oropharyngeal dysphagia with decreased lingual manipulation and control of ice chip and puree solid bolus, delayed swallow initiaiton, immediate cough, watering eyes, with cough being non-productive. Patient is currently not making attempts to verbalize or vocalize, so voice cannot be assessed. He is at a very high aspiration risk at this time. SLP Visit Diagnosis: Dysphagia, unspecified (R13.10)    Aspiration Risk  Severe aspiration risk;Risk for inadequate nutrition/hydration    Diet Recommendation NPO   Medication Administration: Via alternative means    Other  Recommendations Oral Care Recommendations: Oral care QID   Follow up Recommendations Inpatient Rehab;24 hour supervision/assistance      Frequency and Duration min 3x week  1 week       Prognosis Prognosis for Safe Diet Advancement: Good      Swallow Study   General Date of Onset: 04/16/19 HPI: Pt is a 53 y.o. M with no known PMH who presents with right sided weakness, decreased sensation on right, diplopia and headache. CT showing remote L frontal infarcts, CTA head/neck with L P2 occlusion, MRI with acute L PCA  infarct including much of L thalamus, L > R cerebellum, R pons. Now s/p complete revascularization of occluded L PCA with mechanical thrombectomy. ETT 8/27-8/28; self extubated 8/28. Type of Study: Bedside Swallow Evaluation Previous Swallow Assessment: N/A Diet Prior to this Study: NPO Temperature Spikes Noted: Yes Respiratory Status: Room air History of Recent Intubation: Yes Length of Intubations (days): 2 days Date extubated: 04/15/19 Behavior/Cognition: Alert;Requires cueing Oral Cavity Assessment: Within Functional Limits Oral Care Completed by SLP: Yes Oral Cavity - Dentition: Adequate natural dentition Self-Feeding Abilities: Total assist Patient Positioning: Upright in bed Baseline Vocal Quality: Aphonic;Not observed Volitional Cough: Cognitively unable to elicit Volitional Swallow: Able to elicit    Oral/Motor/Sensory Function Overall Oral Motor/Sensory Function: Moderate impairment Facial ROM: Reduced right;Suspected CN VII (facial) dysfunction Facial Symmetry: Abnormal symmetry right;Suspected CN VII (facial) dysfunction Facial Strength: Reduced right Lingual ROM: Reduced right Lingual Symmetry: Abnormal symmetry right Lingual Strength: Reduced   Ice Chips Ice chips: Impaired Oral Phase Impairments: Impaired mastication;Poor awareness of bolus Pharyngeal Phase Impairments: Suspected delayed Swallow;Cough - Immediate   Thin Liquid Thin Liquid: Not tested    Nectar Thick Nectar Thick Liquid: Not tested   Honey Thick Honey Thick Liquid: Not tested   Puree Puree: Impaired Oral Phase Impairments: Reduced lingual movement/coordination;Poor awareness of bolus;Impaired mastication Pharyngeal Phase Impairments: Cough - Immediate;Cough - Delayed   Solid     Solid: Not tested      Dannial Monarch 04/16/2019,6:12 PM  Sonia Baller, MA, CCC-SLP Speech Therapy [facility] Acute Rehab

## 2019-04-16 NOTE — Evaluation (Addendum)
Physical Therapy Evaluation Patient Details Name: Rodney Collier MRN: [id number] DOB: 07-Jan-1966 Today's Date: 04/16/2019   History of Present Illness  Pt is a 53 y.o. M with no known PMH who presents with right sided weakness, decreased sensation on right, diplopia and headache. CT showing remote L frontal infarcts, CTA head/neck with L P2 occlusion, MRI with acute L PCA infarct including much of L thalamus, L > R cerebellum, R pons. Now s/p complete revascularization of occluded L PCA with mechanical thrombectomy. ETT 8/27-8/28; self extubated 8/28.ETT 8/27-8/28; self extubated 8/28.  Clinical Impression  Pt admitted with above. Pt presents with decreased functional mobility secondary to right sided hemiparesis (slight shoulder internal rotation noted on exam), left gaze, poor balance, decreased cognition, impaired communication. Pt requiring two person maximal assist for bed mobility, one person maximal assist to stand edge of bed. SpO2 95% on RA. Following motor commands on left, nonverbal, nodding yes/no in responses to questions. Family history, PLOF, and language preference unknown at this time. Will need post acute rehab services.     Follow Up Recommendations CIR;Supervision/Assistance - 24 hour    Equipment Recommendations  Other (comment)(TBA)    Recommendations for Other Services Rehab consult     Precautions / Restrictions Precautions Precautions: Fall;Other (comment) Precaution Comments: R hemiparesis Restrictions Weight Bearing Restrictions: No      Mobility  Bed Mobility Overal bed mobility: Needs Assistance Bed Mobility: Supine to Sit;Sit to Supine     Supine to sit: Max assist;+2 for physical assistance Sit to supine: Max assist;+2 for physical assistance   General bed mobility comments: MaxA + 2 for supine <> sit, assist for trunk and legs  Transfers Overall transfer level: Needs assistance Equipment used: None Transfers: Sit to/from Stand Sit to Stand: Max  assist         General transfer comment: Face to face transfer, standing with maxA from edge of bed, initiation noted with posterior lean  Ambulation/Gait             General Gait Details: unable  Stairs            Wheelchair Mobility    Modified Rankin (Stroke Patients Only) Modified Rankin (Stroke Patients Only) Pre-Morbid Rankin Score: No symptoms Modified Rankin: Severe disability     Balance Overall balance assessment: Needs assistance Sitting-balance support: Feet supported;Single extremity supported Sitting balance-Leahy Scale: Poor Sitting balance - Comments: requiring modA for static balance, balance reactions noted with perturbations   Standing balance support: No upper extremity supported;During functional activity Standing balance-Leahy Scale: Zero                               Pertinent Vitals/Pain Pain Assessment: No/denies pain    Home Living Family/patient expects to be discharged to:: Unsure                 Additional Comments: Pt reportedly visiting from [place] and driving when stroke occurred. Family has not yet been able to be located/contacted.     Prior Function Level of Independence: Independent         Comments: Driving     Hand Dominance        Extremity/Trunk Assessment   Upper Extremity Assessment Upper Extremity Assessment: RUE deficits/detail;LUE deficits/detail RUE Deficits / Details: Noted slight shoulder internal rotation, otherwise hemiparetic LUE Deficits / Details: WFL    Lower Extremity Assessment Lower Extremity Assessment: LLE deficits/detail;RLE deficits/detail RLE Deficits / Details: 0/5,  responsive to pain LLE Deficits / Details: WFL       Communication   Communication: Expressive difficulties;Receptive difficulties;Other (comment)(possible language barrier?)  Cognition Arousal/Alertness: Lethargic Behavior During Therapy: Flat affect Overall Cognitive Status:  Impaired/Different from baseline Area of Impairment: Orientation;Following commands                 Orientation Level: Disoriented to;Situation     Following Commands: Follows one step commands inconsistently       General Comments: Nonverbal, able to nod yes/no consistently towards beginning of session, but then less reliable with fatigue. Correctly nodding yes when asked where he was when given options i.e. home vs hospital, nodding no when asked if he knew what happened to him. Pt with difficulty keeping eyes open, following motor commands on L      General Comments      Exercises     Assessment/Plan    PT Assessment Patient needs continued PT services  PT Problem List Decreased strength;Decreased range of motion;Decreased activity tolerance;Decreased balance;Decreased mobility;Decreased coordination;Decreased cognition;Impaired sensation       PT Treatment Interventions DME instruction;Gait training;Functional mobility training;Therapeutic activities;Therapeutic exercise;Balance training;Patient/family education    PT Goals (Current goals can be found in the Care Plan section)  Acute Rehab PT Goals Patient Stated Goal: unable PT Goal Formulation: Patient unable to participate in goal setting Time For Goal Achievement: 04/30/19 Potential to Achieve Goals: Fair    Frequency Min 4X/week   Barriers to discharge        Co-evaluation               AM-PAC PT "6 Clicks" Mobility  Outcome Measure Help needed turning from your back to your side while in a flat bed without using bedrails?: Total Help needed moving from lying on your back to sitting on the side of a flat bed without using bedrails?: Total Help needed moving to and from a bed to a chair (including a wheelchair)?: Total Help needed standing up from a chair using your arms (e.g., wheelchair or bedside chair)?: Total Help needed to walk in hospital room?: Total Help needed climbing 3-5 steps with a  railing? : Total 6 Click Score: 6    End of Session Equipment Utilized During Treatment: Gait belt Activity Tolerance: Patient limited by lethargy Patient left: in bed;with call bell/phone within reach;with bed alarm set;Other (comment)(bed in chair position) Nurse Communication: Mobility status PT Visit Diagnosis: Other symptoms and signs involving the nervous system (R29.898);Other abnormalities of gait and mobility (R26.89);Hemiplegia and hemiparesis Hemiplegia - Right/Left: Right Hemiplegia - caused by: Cerebral infarction    Time: 1103-1120 PT Time Calculation (min) (ACUTE ONLY): 17 min   Charges:   PT Evaluation $PT Eval Moderate Complexity: 1 Mod          Laurina Bustlearoline Macey Wurtz, [place]PT, DPT Acute Rehabilitation Services Pager [phone] Office [phone]   Vanetta MuldersCarloine H Bryla Burek 04/16/2019, 12:35 PM

## 2019-04-16 NOTE — Progress Notes (Signed)
Assisted tele visit to patient with family member.  Coreyon Nicotra P, RN  

## 2019-04-17 LAB — GLUCOSE, CAPILLARY
Glucose-Capillary: 94 mg/dL (ref 70–99)
Glucose-Capillary: 66 mg/dL — ABNORMAL LOW (ref 70–99)
Glucose-Capillary: 116 mg/dL — ABNORMAL HIGH (ref 70–99)
Glucose-Capillary: 93 mg/dL (ref 70–99)
Glucose-Capillary: 69 mg/dL — ABNORMAL LOW (ref 70–99)
Glucose-Capillary: 117 mg/dL — ABNORMAL HIGH (ref 70–99)
Glucose-Capillary: 106 mg/dL — ABNORMAL HIGH (ref 70–99)
Glucose-Capillary: 104 mg/dL — ABNORMAL HIGH (ref 70–99)

## 2019-04-17 LAB — TRIGLYCERIDES: Triglycerides: 87 mg/dL (ref <150)

## 2019-04-17 LAB — PROTEIN C, TOTAL: Protein C, Total: 132 % (ref 60–150)

## 2019-04-17 MED ORDER — ASPIRIN 300 MG RE SUPP
300.0000 mg | Freq: Once | RECTAL | Status: AC
  Administered 2019-04-17: 10:00:00 300 mg via RECTAL

## 2019-04-17 MED ORDER — DEXTROSE 50 % IV SOLN
INTRAVENOUS | Status: AC
  Administered 2019-04-17: 25 mL via INTRAVENOUS
  Filled 2019-04-17: qty 50

## 2019-04-17 MED ORDER — DEXTROSE 50 % IV SOLN
25.0000 mL | Freq: Once | INTRAVENOUS | Status: AC
  Administered 2019-04-17: 08:00:00 25 mL via INTRAVENOUS

## 2019-04-17 MED ORDER — DEXTROSE 50 % IV SOLN
25.0000 mL | Freq: Once | INTRAVENOUS | Status: AC
  Administered 2019-04-17: 16:00:00 25 mL via INTRAVENOUS
  Filled 2019-04-17: qty 50

## 2019-04-17 NOTE — Progress Notes (Signed)
  Speech Language Pathology Treatment: Dysphagia  Patient Details Name: Rodney Collier MRN: [id number] DOB: [birth year] Today's Date: 04/17/2019 Time: 2993-7169 SLP Time Calculation (min) (ACUTE ONLY): 14 min  Assessment / Plan / Recommendation Clinical Impression  ST follow up for PO readiness.  Patient alert and able to follow very simple one step commands with ST this date.  Significant right sided facial weakness was seen with suspected impact on sensation.  Lingual range of motion was fair with difficulty elevating and lingual deviation to the right being seen.  Overall lingual weakness was noted.  Full assessment of strength was unable to be completed.  Vocalizations attempted with no verbalizations seen this date. Volitional cough was fair.   The patient was presented with ice chips, thin liquids via spoon and puree boluses.  The patient continued to present with an oral and pharyngeal dysphagia characterized by decreased labial seal leading to anterior escape particularly given thin liquids on the right side with no apparent sensation observed. Swallow trigger was appreciated to palpation.  Strong, delayed cough response was seen following puree bolus trials at the end of the assessment.   Given clinical presentation and reason for admission recommend that the patient remain NPO.  ST will follow up next date to assess for PO readiness vs need for instrumental exam.   HPI HPI: Pt is a 53 y.o. M with no known PMH who presents with right sided weakness, decreased sensation on right, diplopia and headache. CT showing remote L frontal infarcts, CTA head/neck with L P2 occlusion, MRI with acute L PCA infarct including much of L thalamus, L > R cerebellum, R pons. Now s/p complete revascularization of occluded L PCA with mechanical thrombectomy. ETT 8/27-8/28; self extubated 8/28.      SLP Plan  Continue with current plan of care       Recommendations  Diet recommendations: NPO Medication  Administration: Via alternative means                General recommendations: Rehab consult Oral Care Recommendations: Oral care QID Follow up Recommendations: Inpatient Rehab;24 hour supervision/assistance SLP Visit Diagnosis: Dysphagia, unspecified (R13.10) Plan: Continue with current plan of care       [place], [place], [place] Acute Rehab SLP [phone]  Rodney Collier 04/17/2019, 8:47 AM

## 2019-04-17 NOTE — Progress Notes (Signed)
CSW received call from RN to speak with patient's daughter in [place] regarding shelter resources as she wants to visit her father. CSW informed patient that [facility] is for recovery, [place] is full, but as far as CSW is aware there are beds at [facility] but she would need to go through the health department for COVID testing. CSW provided contact information with daughter and discussed other resources. CSW attempted to reach out to [facility] to confirm if they have any rules list or admission criteria for the daughter and was unable to reach staff at time of this note.  Lamonte Richer, LCSW, [place] Worker II [phone]

## 2019-04-17 NOTE — Plan of Care (Signed)
  Problem: Ischemic Stroke/TIA Tissue Perfusion: Goal: Complications of ischemic stroke/TIA will be minimized Outcome: Progressing   

## 2019-04-17 NOTE — Progress Notes (Signed)
STROKE TEAM PROGRESS NOTE   INTERVAL HISTORY Extubated, off sedation, not on cleviprex, nodding appropriately and . He is able to follow commands, not sure if there is any language barrier, expressive aphasia. BP stable at goal. No more seizure-like activity, loaded with keppra and EEG report without epileptiform activity.   Vitals:   04/17/19 1200 04/17/19 1300 04/17/19 1400 04/17/19 1500  BP: (!) 153/84 (!) 146/96 (!) 143/99 (!) 174/94  Pulse: 63 75 67   Resp: 16 15 17    Temp: 98.2 F (36.8 C)     TempSrc: Axillary     SpO2: 100% 96% 97%   Weight:      Height:        CBC:  Recent Labs  Lab 04/14/19 1015  04/15/19 0545 04/15/19 0703  WBC 6.4  --   --  10.1  NEUTROABS 4.7  --   --  7.0  HGB 12.7*   < > 12.2* 12.3*  HCT 40.7   < > 36.0* 40.6  MCV 72.9*  --   --  74.2*  PLT 249  --   --  254   < > = values in this interval not displayed.    Basic Metabolic Panel:  Recent Labs  Lab 04/15/19 0703 04/16/19 0616  NA 138 140  K 4.8 3.6  CL 109 106  CO2 17* 23  GLUCOSE 151* 114*  BUN 10 8  CREATININE 0.82 0.83  CALCIUM 8.9 8.8*  MG 1.8  --   PHOS 2.6  --    Lipid Panel:     Component Value Date/Time   CHOL 139 04/15/2019 0703   TRIG 87 04/17/2019 0544   HDL 39 (L) 04/15/2019 0703   CHOLHDL 3.6 04/15/2019 0703   VLDL 12 04/15/2019 0703   LDLCALC 88 04/15/2019 0703   HgbA1c:  Lab Results  Component Value Date   HGBA1C 12.3 (H) 04/15/2019   Urine Drug Screen: No results found for: LABOPIA, COCAINSCRNUR, LABBENZ, AMPHETMU, THCU, LABBARB  Alcohol Level No results found for: [facility]  IMAGING  [name] 04/15/2019 1. Acute infarction throughout the left PCA territory (including most of the left thalamus), left more than right cerebellum, and right pons. Petechial hemorrhage is seen at the level of the pons.  2. Prior left frontal infarcts and microvascular ischemic gliosis.   Cerebral angiogram 04/14/2019 4 vessel cerebral arteriogram followed by  complete revascularization of occluded Lt PCA  With x 2 passes with 67mm x 90mm embotrap retriever  device achieving a TICI 3 revascularization.  Ct Code Stroke Cta Head W/wo Contrast Ct Code Stroke Cta Neck W/wo Contrast Ct Code Stroke Cta Cerebral Perfusion W/wo Contrast 04/14/2019 1. Left P2 occlusion with downstream reconstitution. Qualitative assessment of CT perfusion maps shows ischemia without visible core infarct in the left occipital lobe. 2. Mild atherosclerosis in the neck without flow limiting stenosis or embolic source seen. 3. No large vessel occlusion in the anterior circulation. 4. Mild mid basilar narrowing.   Ct Head Code Stroke Wo Contrast 04/14/2019 1. No acute finding.  2. Small remote appearing infarcts in the left frontal lobe.    CT Head WO Contrast 04/16/2019 IMPRESSION: Areas of acute infarction are showing low-density and swelling but no macroscopic hemorrhage by CT. As shown by previous MRI, there is involvement of the cerebellum, left cerebral peduncle and thalamus and left posteromedial temporal lobe and occipital lobe. Tiny focus of infarction in the right occipital lobe as well.  LE Doppler 04/15/2019 Summary:  Right: There is no evidence of deep vein thrombosis in the lower extremity. No cystic structure found in the popliteal fossa. Left: There is no evidence of deep vein thrombosis in the lower extremity. No cystic structure found in the popliteal fossa.  2D Echocardiogram 04/15/2019 IMPRESSIONS  1. The left ventricle has normal systolic function, with an ejection fraction of 55-60%. The cavity size was normal. Left ventricular diastolic parameters were normal. No evidence of left ventricular regional wall motion abnormalities.  2. The right ventricle has normal systolic function. The cavity was normal. There is no increase in right ventricular wall thickness.  3. The mitral valve is abnormal. Mild thickening of the mitral valve leaflet. The MR jet is  posteriorly-directed.  4. The tricuspid valve is grossly normal.  5. The aortic valve is tricuspid. No stenosis of the aortic valve.  6. The aorta is normal unless otherwise noted.  7. The inferior vena cava was normal in size with <50% respiratory variability. SUMMARY LVEF 55-60%, normal wall thickness, normal wall motion, normal diastolic function, mild posteriorly directed MR, normal size IVC that does not collapse, no PFO by color doppler  FINDINGS  Left Ventricle: The left ventricle has normal systolic function, with an ejection fraction of 55-60%. The cavity size was normal. There is no increase in left ventricular wall thickness. Left ventricular diastolic parameters were normal. Normal left ventricular filling pressures No evidence of left ventricular regional wall motion abnormalities..  EEG  04/15/2019 IMPRESSION: This study is suggestive of cortical dysfunction in the left hemisphere secondary to underlying stroke as well as mild diffuse encephalopathy. No seizures or definite epileptiform discharges were seen throughout the recording.  PHYSICAL EXAM  Temp:  [98.2 F (36.8 C)-100.7 F (38.2 C)] 98.2 F (36.8 C) (08/30 1200) Pulse Rate:  [63-94] 67 (08/30 1400) Resp:  [15-22] 17 (08/30 1400) BP: (135-180)/(83-100) 174/94 (08/30 1500) SpO2:  [94 %-100 %] 97 % (08/30 1400)  General - Well nourished, well developed, intubated off sedation.  Ophthalmologic - fundi not visualized due to noncooperation.  Cardiovascular - Regular rate and rhythm.  Neuro - extubated off sedation, eyes open, following most of the simple commands. Eyes mid position, able to have left gaze with right eye nystagmus and can cross the midline slightly. PERRL, blinking on the left not on the right. Right facial droop, tongue protrusion not cooperative, against gravity of LUE but significant ataxia, able to follow simple commands with left hand. lifts left leg slightly off of the bed on command, follow  simple commands of the left foot. On pain stimulation, slight withdraw of RLE but no movement of RUE. DTR 1+ and no babinski. Sensation and gait not tested.   ASSESSMENT/PLAN [name] with no significant past medical history presenting with right-sided weakness, decreased sensation on the right, diplopia and headache. Complete revascularization of occluded L PCA w/ mechanical thrombectomy.  Stroke: bilateral embolic infarcts most significant in the left P2 territory with P2 occlusion s/p IR with TICI3 reperfusion - embolic secondary to unknown source, likely hypercoagulable state associated with severe DM and hyperglycemia  Code Stroke CT head No acute abnormality. Old infarct L frontal lobe.   CTA head & neck L P2 occlusion. Mild neck atherosclerosis . No LVO anterior circulation. Mild mid BA narrowing.  CT perfusion ischemia w/o visible core  CT Head WO Contrast - 04/16/19 - Areas of acute infarction are showing low-density and swelling but no macroscopic hemorrhage by CT.  As shown  by previous MRI, there is involvement of the cerebellum, left cerebral peduncle and thalamus and left posteromedial temporal lobe and occipital lobe. Tiny focus of infarction in the right occipital lobe as well.  Cerebral angio TICI reperfusion of occluded L PCA  Post IR CT brain no ICH.  MRI  L PCA (including thalamus), L>R cerebellum, R pontine and left midbrain infarct. Petechial hemorrhage seen in pons. Old L frontal infarcts. Small vessel disease.   LE Doppler no DVT - no evidence of deep vein thrombosis   2D Echo - EF 55-60%.  No cardiac source of emboli identified.   Tentative TEE on Monday (NPO)  UDS - pending   EEG - suggestive of cortical dysfunction in the left hemisphere secondary to underlying stroke as well as mild diffuse encephalopathy. No seizures or definite epileptiform discharges were seen throughout the recording.  LDL 88  HgbA1c 12.3  Hypercoagulable  labs - pending [Anti thrombin III Function - 135% (H) normal 75 - 120%]  SCDs for VTE prophylaxis  No antithrombotic prior to admission, now on aspirin 325 mg daily.   Therapy recommendations:  SNF  Disposition:  pending   Possible seizure  Full body rigid shaking hours post IR, L>R  Loaded with keppra  EEG - suggestive of cortical dysfunction in the left hemisphere secondary to underlying stroke as well as mild diffuse encephalopathy. No seizures or definite epileptiform discharges were seen throughout the recording.  Continue keppra 500mg  bid  Blood Pressure  On cleviprex gtt for BP contron post IR, now off . BP goals 120-140 x 24h post IR . Long-term BP goal normotensive  Hyperlipidemia  Home meds:  No statin  LDL 88, goal < 70  Add lipitor 20  Continue statin at discharge  Diabetes type II, new diagnosis, Uncontrolled  HgbA1c 12.3, goal < 7.0  CBGs  SSI  Hyperglycemia, improved   On levemir  Likely hypercoagulable condition associated with uncontrolled DM  Dysphagia . Secondary to stroke . NPO . Speech, may need Kortrak Monday for TFs   Other Stroke Risk Factors   Small remote appearing infarcts in the left frontal lobe by CT and MRI  Other Active Problems  Pt self extubated 04/15/19 - CCM on board  S/P Complete revascularization of occluded L PCA w/ mechanical thrombectomy 04/14/2019  Hospital day # 3  Patient with embolic stroke, s/p IR thrombectomy, hyperglycemia, extubated, seizure activity and at risk of neurological worsening, death form recurrent stroke, DKA, status epilepticus, hemorrhagic conversion. This patient's care requires constant monitoring of vital signs, hemodynamics, respiratory and cardiac monitoring, review of multiple databases, neurological assessment, discussion with family, other specialists and medical decision making of high complexity. I spent 30 minutes of neurocritical care time in the care of this  patient.  Personally examined patient and images, and have participated in and made any corrections needed to history, physical, neuro exam,assessment and plan as stated above.  I have personally obtained the history, evaluated lab date, reviewed imaging studies and agree with radiology interpretations.       To contact Stroke Continuity provider, please refer to [email or web address]. After hours, contact General Neurology

## 2019-04-17 NOTE — Evaluation (Signed)
Occupational Therapy Evaluation Patient Details Name: Rodney Collier MRN: [id number] DOB: [birth year] Today's Date: 04/17/2019    History of Present Illness Pt is a 53 y.o. M with no known PMH who presents with right sided weakness, decreased sensation on right, diplopia and headache. CT showing remote L frontal infarcts, CTA head/neck with L P2 occlusion, MRI with acute L PCA infarct including much of L thalamus, L > R cerebellum, R pons. Now s/p complete revascularization of occluded L PCA with mechanical thrombectomy. ETT 8/27-8/28; self extubated 8/28.   Clinical Impression   PT admitted with L frontal infarcts, L PCA including thalamus. Pt currently with functional limitiations due to the deficits listed below (see OT problem list). PT noted to have R eye internal rotation and nystagmus. Question diplopia at this time. Pt closing single eye at times during session. Pt total+2 max (A) to sit eob.  Pt will benefit from skilled OT to increase their independence and safety with adls and balance to allow discharge SNF.     Follow Up Recommendations  SNF    Equipment Recommendations  Other (comment)(tba)    Recommendations for Other Services       Precautions / Restrictions Precautions Precautions: Fall;Other (comment) Precaution Comments: R hemiparesis      Mobility Bed Mobility Overal bed mobility: Needs Assistance Bed Mobility: Supine to Sit;Sit to Supine     Supine to sit: +2 for physical assistance;Max assist;HOB elevated Sit to supine: +2 for physical assistance;Max assist   General bed mobility comments: pt initiated with L UE once movement started. pt attempting to scoot L hip out to eob. pt reacing with L UE to hold on bed rail. pt demonstrates pushing with L UE on bed surface. pt activating core  Transfers                 General transfer comment: not attempted this session    Balance                                           ADL either  performed or assessed with clinical judgement   ADL Overall ADL's : Needs assistance/impaired                                       General ADL Comments: total (A) for all adls.     Vision   Vision Assessment?: Vision impaired- to be further tested in functional context Additional Comments: pt noted to internal rotation R eye and nystagmus noted     Perception     Praxis      Pertinent Vitals/Pain Pain Assessment: No/denies pain     Hand Dominance (unknown)   Extremity/Trunk Assessment Upper Extremity Assessment Upper Extremity Assessment: RUE deficits/detail RUE Deficits / Details: flaccid   Lower Extremity Assessment Lower Extremity Assessment: Defer to PT evaluation   Cervical / Trunk Assessment Cervical / Trunk Assessment: Normal   Communication Communication Communication: Other (comment)([place] (fluent) and [place])   Cognition Arousal/Alertness: Lethargic Behavior During Therapy: Flat affect Overall Cognitive Status: Impaired/Different from baseline Area of Impairment: Following commands                       Following Commands: Follows one step commands inconsistently;Follows one step commands with increased time  General Comments: pt nonverbal, pt inconsistent with responses and delayed responses to commands. pt does not answer yes or no   General Comments  positioned with bed in chair position. requires R lateral support for midline posture    Exercises     Shoulder Instructions      Home Living Family/patient expects to be discharged to:: Unsure     Type of Home: House                           Additional Comments: Pt reportedly visiting from [place] and driving when stroke occurred. Family has been contacted wife is separted and daughter is unable to provide care per chart.       Prior Functioning/Environment Level of Independence: Independent        Comments: Driving         OT Problem List: Decreased strength;Decreased range of motion;Decreased activity tolerance;Impaired balance (sitting and/or standing);Impaired vision/perception;Decreased coordination;Decreased cognition;Decreased safety awareness;Decreased knowledge of use of DME or AE;Decreased knowledge of precautions;Impaired UE functional use;Impaired sensation      OT Treatment/Interventions: Self-care/ADL training;Therapeutic exercise;Neuromuscular education;Energy conservation;DME and/or AE instruction;Manual therapy;Therapeutic activities;Balance training;Patient/family education;Cognitive remediation/compensation;Visual/perceptual remediation/compensation    OT Goals(Current goals can be found in the care plan section) Acute Rehab OT Goals Patient Stated Goal: unable OT Goal Formulation: Patient unable to participate in goal setting Time For Goal Achievement: 05/01/19 Potential to Achieve Goals: Good  OT Frequency: Min 2X/week   Barriers to D/C:            Co-evaluation              AM-PAC OT "6 Clicks" Daily Activity     Outcome Measure Help from another person eating meals?: Total Help from another person taking care of personal grooming?: Total Help from another person toileting, which includes using toliet, bedpan, or urinal?: Total Help from another person bathing (including washing, rinsing, drying)?: Total Help from another person to put on and taking off regular upper body clothing?: Total Help from another person to put on and taking off regular lower body clothing?: Total 6 Click Score: 6   End of Session Nurse Communication: Mobility status;Precautions  Activity Tolerance: Patient tolerated treatment well Patient left: in bed;with call bell/phone within reach;with bed alarm set;with SCD's reapplied  OT Visit Diagnosis: Unsteadiness on feet (R26.81);Hemiplegia and hemiparesis Hemiplegia - Right/Left: Right Hemiplegia - dominant/non-dominant: Dominant                 Time: 1209-1226 OT Time Calculation (min): 17 min Charges:  OT General Charges $OT Visit: 1 Visit OT Evaluation $OT Eval Moderate Complexity: 1 Mod   Rodney Collier, OTR/L  Acute Rehabilitation Services Pager: [phone] Office: [phone] .   Rodney Collier 04/17/2019, 12:56 PM

## 2019-04-17 NOTE — Progress Notes (Signed)
NAME:  Rodney Collier, MRN:  [id number], DOB:  04-Mar-1966, LOS: 3 ADMISSION DATE:  04/14/2019, CONSULTATION DATE:  04/14/2019 REFERRING MD:  Neuro IR - Devashwar, CHIEF COMPLAINT:  AMS   Brief History   53 year old male presents 8/27 with no known PMH presenting dysarthric with last seen normal at midnight the day prior to presentation and taken to IR.  PCCM consulted for vent management.  No further history is available.  Past Medical History  As above  Significant Hospital Events   8/27 NIR for revascularization of occluded PCA  Consults:  PCCM NIR  Procedures:  ETT 8/27>>>self-extubated ~2300 8/28 PIV 8/27>>>  Significant Diagnostic Tests:  8/27 CT H> remote L frontal infarcts 8/27 CTA head/neck> L P2 occlusion 8/28 MRI Brain> acute L PCA infarct including much of L thalamus, L>R cerebellum, R pons. Petechial hemorrhage at level of pons. Prior L frontal infarcts. 8/28 CXR> no acute cardiopulmonary abnormality. ETT NGT in stable position   Micro Data:  8/27 SARS CoV2> neg   Antimicrobials:  N/A   Interim history/subjective:  Overnight self-extubated; protecting airway and following most commands (R side paretic)  Cleviprex and insulin gtt weaned off 8/28    Objective   Blood pressure (!) 180/97, pulse 75, temperature 99.1 F (37.3 C), temperature source Axillary, resp. rate 19, height 6' (1.829 m), weight 77.6 kg, SpO2 95 %.        Intake/Output Summary (Last 24 hours) at 04/17/2019 0804 Last data filed at 04/17/2019 0600 Gross per 24 hour  Intake 1743.23 ml  Output 2600 ml  Net -856.77 ml   Filed Weights   04/14/19 1500  Weight: 77.6 kg    Examination: General: ill appearing adult male,  NAD  HENT: [place]/AT; midline gaze,  pink mmm; anicteric sclera  Lungs: CTA bilaterally. Symmetrical chest expansion, no accessory muscle use  Cardiovascular: RRR s1s2 no r/m/g. Cap refill >2 sec, 2+ radial pulses  Abdomen: soft, flat, ndnt, + bowel sounds  Extremities:  Symmetrical bulk and tone, no obvious deformity. No cyanosis no clubbing no edema  Neuro:  CN II, III, IV, VI appear intact. No nystagmus. eybrow raise only on left side. Pt swallows on request. Left shoulder shrug, unable to move R side. Unable to stick tongue out, but appears midline at rest, w/o fasciculation.  Moves LUE LLE to command unable to move/squeeze/withdraw right. 3-4 beat right ankle clonus Skin: Cool RUE RLE distally. Skin is clean, dry, without rash   Resolved Hospital Problem list   N/A  Assessment & Plan:   Acute respiratory failure requiring intubation -extubated self 8/28, protecting airway -continue pulm hygiene    Acute encephalopathy in setting of CVA -L PCA infarct involving much of L pons, L>R cerebellum, R pons   P -CVA per neuro -Keppra load overnight per neuro, EEG to be completed this morning  HTN P -SBP goal per NIR   DM P -SSI  Malnutrition, at risk P -failed swallow test-plan Kortrak -PT/OT eval, recc. Inpatient rehab   Family located, aware of situation. Sounds like a family member is trying to come down, social work assisting   Labs   CBC: Recent Labs  Lab 04/14/19 1015 04/14/19 1024 04/14/19 1438 04/15/19 0545 04/15/19 0703  WBC 6.4  --   --   --  10.1  NEUTROABS 4.7  --   --   --  7.0  HGB 12.7* 14.6 12.9* 12.2* 12.3*  HCT 40.7 43.0 38.0* 36.0* 40.6  MCV 72.9*  --   --   --  74.2*  PLT 249  --   --   --  254    Basic Metabolic Panel: Recent Labs  Lab 04/14/19 1015 04/14/19 1024 04/14/19 1438 04/15/19 0545 04/15/19 0703 04/16/19 0616  NA 134* 135 137 140 138 140  K 3.7 3.7 3.7 3.9 4.8 3.6  CL 98 97*  --   --  109 106  CO2 23  --   --   --  17* 23  GLUCOSE 342* 327*  --   --  151* 114*  BUN 11 12  --   --  10 8  CREATININE 0.92 0.70  --   --  0.82 0.83  CALCIUM 9.2  --   --   --  8.9 8.8*  MG  --   --   --   --  1.8  --   PHOS  --   --   --   --  2.6  --    GFR: Estimated Creatinine Clearance: 114.3 mL/min  (by C-G formula based on SCr of 0.83 mg/dL). Recent Labs  Lab 04/14/19 1015 04/15/19 0703  WBC 6.4 10.1    Liver Function Tests: Recent Labs  Lab 04/14/19 1015 04/16/19 0616  AST 23 21  ALT 23 17  ALKPHOS 63 65  BILITOT 0.8 1.1  PROT 6.9 6.7  ALBUMIN 3.5 3.2*   No results for input(s): LIPASE, AMYLASE in the last 168 hours. No results for input(s): AMMONIA in the last 168 hours.  ABG    Component Value Date/Time   PHART 7.453 (H) 04/15/2019 0545   PCO2ART 27.8 (L) 04/15/2019 0545   PO2ART 93.0 04/15/2019 0545   HCO3 19.4 (L) 04/15/2019 0545   TCO2 20 (L) 04/15/2019 0545   ACIDBASEDEF 3.0 (H) 04/15/2019 0545   O2SAT 98.0 04/15/2019 0545     Coagulation Profile: Recent Labs  Lab 04/14/19 1015  INR 1.0    Cardiac Enzymes: No results for input(s): CKTOTAL, CKMB, CKMBINDEX, TROPONINI in the last 168 hours.  HbA1C: Hgb A1c MFr Bld  Date/Time Value Ref Range Status  04/15/2019 07:03 AM 12.3 (H) 4.8 - 5.6 % Final    Comment:    (NOTE) Pre diabetes:          5.7%-6.4% Diabetes:              >6.4% Glycemic control for   <7.0% adults with diabetes   04/14/2019 08:30 PM 12.3 (H) 4.8 - 5.6 % Final    Comment:    (NOTE) Pre diabetes:          5.7%-6.4% Diabetes:              >6.4% Glycemic control for   <7.0% adults with diabetes     CBG: Recent Labs  Lab 04/16/19 1523 04/16/19 1914 04/16/19 2309 04/17/19 0303 04/17/19 0744  GLUCAP 121* 103* 114* 94 66*      CRITICAL CARE  Total critical care time: 35 minutes  Critical care time was exclusive of separately billable procedures and treating other patients.  Critical care was necessary to treat or prevent imminent or life-threatening deterioration.  Critical care was time spent personally by me on the following activities: development of treatment plan with patient and/or surrogate as well as nursing, discussions with consultants, evaluation of patient's response to treatment, examination of  patient, obtaining history from patient or surrogate, ordering and performing treatments and interventions, ordering and review of laboratory studies, ordering and review of radiographic studies, pulse oximetry and re-evaluation of  patient's condition.  Will sign off for now, but remain available if needed Gwynne EdingerPaul C. Ricky Gallery, MD PhD 04/17/2019, 8:04 AM

## 2019-04-18 ENCOUNTER — Inpatient Hospital Stay (HOSPITAL_COMMUNITY): Payer: Medicaid - Out of State

## 2019-04-18 ENCOUNTER — Encounter (HOSPITAL_COMMUNITY): Payer: Self-pay | Admitting: Interventional Radiology

## 2019-04-18 DIAGNOSIS — I639 Cerebral infarction, unspecified: Secondary | ICD-10-CM | POA: Diagnosis not present

## 2019-04-18 LAB — CARDIOLIPIN ANTIBODIES, IGG, IGM, IGA
Anticardiolipin IgA: <9 APL U/mL (ref 0–11)
Anticardiolipin IgG: <9 GPL U/mL (ref 0–14)
Anticardiolipin IgM: <9 MPL U/mL (ref 0–12)

## 2019-04-18 LAB — GLUCOSE, CAPILLARY
Glucose-Capillary: 107 mg/dL — ABNORMAL HIGH (ref 70–99)
Glucose-Capillary: 103 mg/dL — ABNORMAL HIGH (ref 70–99)
Glucose-Capillary: 116 mg/dL — ABNORMAL HIGH (ref 70–99)
Glucose-Capillary: 137 mg/dL — ABNORMAL HIGH (ref 70–99)
Glucose-Capillary: 135 mg/dL — ABNORMAL HIGH (ref 70–99)
Glucose-Capillary: 145 mg/dL — ABNORMAL HIGH (ref 70–99)

## 2019-04-18 LAB — ANTINUCLEAR ANTIBODIES, IFA: ANA Ab, IFA: NEGATIVE

## 2019-04-18 LAB — BETA-2-GLYCOPROTEIN I ABS, IGG/M/A
Beta-2 Glyco I IgG: <9 GPI IgG units (ref 0–20)
Beta-2-Glycoprotein I IgA: 25 GPI IgA units (ref 0–25)
Beta-2-Glycoprotein I IgM: <9 GPI IgM units (ref 0–32)

## 2019-04-18 LAB — TRIGLYCERIDES: Triglycerides: 116 mg/dL (ref <150)

## 2019-04-18 LAB — HOMOCYSTEINE: Homocysteine: 9.1 umol/L (ref 0.0–14.5)

## 2019-04-18 MED ORDER — BETHANECHOL CHLORIDE 25 MG PO TABS
25.0000 mg | ORAL_TABLET | Freq: Three times a day (TID) | ORAL | Status: DC
  Administered 2019-04-18 – 2019-06-11 (×156): 25 mg via ORAL
  Filled 2019-04-18 (×130): qty 1
  Filled 2019-04-18: qty 3
  Filled 2019-04-18 (×60): qty 1
  Filled 2019-04-18: qty 3
  Filled 2019-04-18 (×13): qty 1

## 2019-04-18 MED ORDER — RESOURCE THICKENUP CLEAR PO POWD
ORAL | Status: DC | PRN
  Filled 2019-04-18: qty 125

## 2019-04-18 MED ORDER — ASPIRIN 300 MG RE SUPP
300.0000 mg | Freq: Once | RECTAL | Status: AC
  Administered 2019-04-18: 300 mg via RECTAL

## 2019-04-18 NOTE — Progress Notes (Signed)
Physical Therapy Treatment Patient Details Name: Rodney SinclairSamuel Hilburn MRN: [id number] DOB: 1966/06/26 Today's Date: 04/18/2019    History of Present Illness Pt is a 53 y.o. M with no known PMH who presents with right sided weakness, decreased sensation on right, diplopia and headache. CT showing remote L frontal infarcts, CTA head/neck with L P2 occlusion, MRI with acute L PCA infarct including much of L thalamus, L > R cerebellum, R pons. Now s/p complete revascularization of occluded L PCA with mechanical thrombectomy. ETT 8/27-8/28; self extubated 8/28.    PT Comments    Pt presenting with depressed spirits, flat affect, and non-verbal. Pt cont with significant R hemiparesis and R sided neglect. Pt did tolerate EOB well today and was able to stand and stand pvt to chair today with maxA. Focused on truncal control and re-orientation to mindline position and maintaining balance at EOB. Acute PT to cont to follow. Cont to recommend CIR upon d/c.    Follow Up Recommendations  CIR;Supervision/Assistance - 24 hour     Equipment Recommendations  Other (comment)(TBD determined at next venue)    Recommendations for Other Services Rehab consult     Precautions / Restrictions Precautions Precautions: Fall Precaution Comments: R hemiparesis and neglect Restrictions Weight Bearing Restrictions: No    Mobility  Bed Mobility Overal bed mobility: Needs Assistance Bed Mobility: Supine to Sit;Sit to Supine     Supine to sit: Max assist;+2 for physical assistance Sit to supine: Max assist;+2 for physical assistance   General bed mobility comments: with max directional verbal cues pt did use L UE and LE to initiate rolling to the R. Pt unable to push up with R UE into sitting, maxAx2 for trunk elevation  Transfers Overall transfer level: Needs assistance Equipment used: (2 person lift with gait belt) Transfers: Sit to/from [employer]Stand;Squat Pivot Transfers Sit to Stand: Max assist;+2 physical  assistance         General transfer comment: pt powered up well with L UE and LE, R knee requires blocking, max directional verbal cues to sequence stepping, maxA to advance R LE during std pvt transfer, pt did attempt to move  L LE however unable to clear foot  Ambulation/Gait             General Gait Details: unable at this time   Stairs             Wheelchair Mobility    Modified Rankin (Stroke Patients Only) Modified Rankin (Stroke Patients Only) Pre-Morbid Rankin Score: No symptoms Modified Rankin: Severe disability     Balance Overall balance assessment: Needs assistance Sitting-balance support: Feet supported;Single extremity supported Sitting balance-Leahy Scale: Poor Sitting balance - Comments: requiring varying assist from min to maxA. worked at [employer]EOB on trunk strength and finding midline posture. worked on leaning onto R elbow trying to increase WBing and stimulate muscle contraction to support self   Standing balance support: Bilateral upper extremity supported Standing balance-Leahy Scale: Poor Standing balance comment: dependent on physical assist                            Cognition Arousal/Alertness: Awake/alert Behavior During Therapy: Flat affect Overall Cognitive Status: Impaired/Different from baseline Area of Impairment: Problem solving;Following commands                       Following Commands: Follows one step commands with increased time;Follows one step commands consistently(more dificulty with R side due  to hemi and neglect)     Problem Solving: Slow processing;Difficulty sequencing;Requires verbal cues;Requires tactile cues;Decreased initiation General Comments: pt non-verbal, unable to use R UE and LE, pt appears to have depressed spirits      Exercises      General Comments General comments (skin integrity, edema, etc.): VSS      Pertinent Vitals/Pain Pain Assessment: Faces Faces Pain Scale: Hurts  little more Pain Location: when asked about pain, pt rubbing L hand over R UE and LE, pt non-verbal and unable to rate pain or describe Pain Intervention(s): Monitored during session    Home Living                      Prior Function            PT Goals (current goals can now be found in the care plan section) Progress towards PT goals: Progressing toward goals    Frequency    Min 4X/week      PT Plan Current plan remains appropriate    Co-evaluation              AM-PAC PT "6 Clicks" Mobility   Outcome Measure  Help needed turning from your back to your side while in a flat bed without using bedrails?: Total Help needed moving from lying on your back to sitting on the side of a flat bed without using bedrails?: Total Help needed moving to and from a bed to a chair (including a wheelchair)?: Total Help needed standing up from a chair using your arms (e.g., wheelchair or bedside chair)?: Total Help needed to walk in hospital room?: Total Help needed climbing 3-5 steps with a railing? : Total 6 Click Score: 6    End of Session Equipment Utilized During Treatment: Gait belt Activity Tolerance: Patient limited by lethargy Patient left: in bed;with call bell/phone within reach;with bed alarm set;Other (comment) Nurse Communication: Mobility status PT Visit Diagnosis: Other symptoms and signs involving the nervous system (R29.898);Other abnormalities of gait and mobility (R26.89);Hemiplegia and hemiparesis Hemiplegia - Right/Left: Right Hemiplegia - dominant/non-dominant: Dominant Hemiplegia - caused by: Cerebral infarction     Time: 0940-1004 PT Time Calculation (min) (ACUTE ONLY): 24 min  Charges:  $Therapeutic Activity: 8-22 mins $Neuromuscular Re-education: 8-22 mins                     Kittie Plater, PT, DPT Acute Rehabilitation Services Pager #: [phone] Office #: [phone]    Berline Lopes 04/18/2019, 10:23 AM

## 2019-04-18 NOTE — Progress Notes (Signed)
TCD bubble study has been completed.   Preliminary results in CV Proc.   Abram Sander 04/18/2019 3:51 PM

## 2019-04-18 NOTE — Progress Notes (Signed)
STROKE TEAM PROGRESS NOTE   INTERVAL HISTORY Patient is sitting up in bedside chair.  Is drowsy but arousable.  Continues to have left gaze preference, right-sided visual field loss and dense right hemiplegia.  No seizure activity noted.  Blood pressure adequately controlled.  Patient is still n.p.o. but plans to go for modified barium swallow later this morning and possibly Cortrack tube placement if he fails  Vitals:   04/18/19 0500 04/18/19 0602 04/18/19 0700 04/18/19 0800  BP: (!) 149/78 (!) 162/86 (!) 152/96 (!) 152/87  Pulse: 83 74 81 65  Resp: (!) 24 19 20 18   Temp:    99.2 F (37.3 C)  TempSrc:    Oral  SpO2: 99% 99% 100% 100%  Weight:      Height:        CBC:  Recent Labs  Lab 04/14/19 1015  04/15/19 0545 04/15/19 0703  WBC 6.4  --   --  10.1  NEUTROABS 4.7  --   --  7.0  HGB 12.7*   < > 12.2* 12.3*  HCT 40.7   < > 36.0* 40.6  MCV 72.9*  --   --  74.2*  PLT 249  --   --  254   < > = values in this interval not displayed.    Basic Metabolic Panel:  Recent Labs  Lab 04/15/19 0703 04/16/19 0616  NA 138 140  K 4.8 3.6  CL 109 106  CO2 17* 23  GLUCOSE 151* 114*  BUN 10 8  CREATININE 0.82 0.83  CALCIUM 8.9 8.8*  MG 1.8  --   PHOS 2.6  --    Lipid Panel:     Component Value Date/Time   CHOL 139 04/15/2019 0703   TRIG 116 04/18/2019 0548   HDL 39 (L) 04/15/2019 0703   CHOLHDL 3.6 04/15/2019 0703   VLDL 12 04/15/2019 0703   LDLCALC 88 04/15/2019 0703   HgbA1c:  Lab Results  Component Value Date   HGBA1C 12.3 (H) 04/15/2019   Urine Drug Screen: No results found for: LABOPIA, COCAINSCRNUR, LABBENZ, AMPHETMU, THCU, LABBARB  Alcohol Level No results found for: ETH  IMAGING CT Head WO Contrast 04/16/2019 Areas of acute infarction are showing low-density and swelling but no macroscopic hemorrhage by CT. As shown by previous MRI, there is involvement of the cerebellum, left cerebral peduncle and thalamus and left posteromedial temporal lobe and occipital  lobe. Tiny focus of infarction in the right occipital lobe as well.  [name] 04/15/2019 1. Acute infarction throughout the left PCA territory (including most of the left thalamus), left more than right cerebellum, and right pons. Petechial hemorrhage is seen at the level of the pons.  2. Prior left frontal infarcts and microvascular ischemic gliosis.   Cerebral angiogram 04/14/2019 4 vessel cerebral arteriogram followed by complete revascularization of occluded Lt PCA  With x 2 passes with 34mm x 47mm embotrap retriever  device achieving a TICI 3 revascularization.  Ct Code Stroke Cta Head W/wo Contrast Ct Code Stroke Cta Neck W/wo Contrast Ct Code Stroke Cta Cerebral Perfusion W/wo Contrast 04/14/2019 1. Left P2 occlusion with downstream reconstitution. Qualitative assessment of CT perfusion maps shows ischemia without visible core infarct in the left occipital lobe. 2. Mild atherosclerosis in the neck without flow limiting stenosis or embolic source seen. 3. No large vessel occlusion in the anterior circulation. 4. Mild mid basilar narrowing.   Ct Head Code Stroke Wo Contrast 04/14/2019 1. No acute finding.  2.  Small remote appearing infarcts in the left frontal lobe.    LE Doppler 04/15/2019 Right: There is no evidence of deep vein thrombosis in the lower extremity. No cystic structure found in the popliteal fossa. Left: There is no evidence of deep vein thrombosis in the lower extremity. No cystic structure found in the popliteal fossa.  2D Echocardiogram 04/15/2019  1. The left ventricle has normal systolic function, with an ejection fraction of 55-60%. The cavity size was normal. Left ventricular diastolic parameters were normal. No evidence of left ventricular regional wall motion abnormalities.  2. The right ventricle has normal systolic function. The cavity was normal. There is no increase in right ventricular wall thickness.  3. The mitral valve is abnormal. Mild  thickening of the mitral valve leaflet. The MR jet is posteriorly-directed.  4. The tricuspid valve is grossly normal.  5. The aortic valve is tricuspid. No stenosis of the aortic valve.  6. The aorta is normal unless otherwise noted.  7. The inferior vena cava was normal in size with <50% respiratory variability. SUMMARY LVEF 55-60%, normal wall thickness, normal wall motion, normal diastolic function, mild posteriorly directed MR, normal size IVC that does not collapse, no PFO by color doppler  FINDINGS  Left Ventricle: The left ventricle has normal systolic function, with an ejection fraction of 55-60%. The cavity size was normal. There is no increase in left ventricular wall thickness. Left ventricular diastolic parameters were normal. Normal left ventricular filling pressures No evidence of left ventricular regional wall motion abnormalities..  EEG  04/15/2019 This study is suggestive of cortical dysfunction in the left hemisphere secondary to underlying stroke as well as mild diffuse encephalopathy. No seizures or definite epileptiform discharges were seen throughout the recording.  TCD w/ bubble Pending  PHYSICAL EXAM   General - Well nourished, well developed, African gentleman who is not in distress Ophthalmologic - fundi not visualized due to noncooperation. Lungs clear to auscultation Cardiovascular - Regular rate and rhythm. Distal pulses are well felt  Neurological Exam-awake, eyes open, following most of the simple commands. Eyes mid position, able to have left gaze with right eye nystagmus and can cross the midline slightly. PERRL, blinking on the left not on the right. Right facial droop, tongue protrusion not cooperative, against gravity of LUE but significant ataxia, able to follow simple commands with left hand. lifts left leg slightly off of the bed on command, follow simple commands of the left foot. On pain stimulation, slight withdraw of RLE but no movement of RUE or  RLE. DTR 1+ and no babinski. Sensation and gait not tested.   ASSESSMENT/PLAN Mr. Starr SinclairSamuel Raden is a 53 y.o. male with no significant past medical history presenting with right-sided weakness, decreased sensation on the right, diplopia and headache. Complete revascularization of occluded L PCA w/ mechanical thrombectomy.   Stroke: Multiple posterior circulation infarcts-bilateral  Cerebellar,Right Pons,embolic infarcts and most significant in the left P2 territory with P2 occlusion s/p IR with TICI3 reperfusion - embolic secondary midbasilar stenosis  Code Stroke CT head No acute abnormality. Old infarct L frontal lobe.   CTA head & neck L P2 occlusion. Mild neck atherosclerosis . No LVO anterior circulation. Mild mid BA narrowing.  CT perfusion ischemia w/o visible core  CT Head WO Contrast - 04/16/19 - Areas of acute infarction are showing low-density and swelling but no macroscopic hemorrhage by CT.  As shown by previous MRI, there is involvement of the cerebellum, left cerebral peduncle and thalamus and left posteromedial  temporal lobe and occipital lobe. Tiny focus of infarction in the right occipital lobe as well.  Cerebral angio TICI reperfusion of occluded L PCA  Post IR CT brain no ICH.  MRI  L PCA (including thalamus), L>R cerebellum, R pontine and left midbrain infarct. Petechial hemorrhage seen in pons. Old L frontal infarcts. Small vessel disease.   LE Doppler no DVT - no evidence of deep vein thrombosis   2D Echo - EF 55-60%.  No cardiac source of emboli identified.   TCD bubble pending  TEE on Tues (NPO)  UDS - pending   EEG - suggestive of cortical dysfunction in the left hemisphere secondary to underlying stroke as well as mild diffuse encephalopathy. No seizures or definite epileptiform discharges were seen throughout the recording.  LDL 88  HgbA1c 12.3  Hypercoagulable labs - pending [Anti thrombin III Function - 135% (H) normal 75 - 120%]  SCDs for VTE  prophylaxis  No antithrombotic prior to admission, now on aspirin 300 mg suppository daily. Once able to swallow, change to aspirin 81 mg and plavix 75 mg daily x 3 weeks, then aspirin alone.   Therapy recommendations:  SNF and CIR - CIR consult in place  Disposition:  pending   Possible seizure  Full body rigid shaking hours post IR, L>R  Loaded with keppra  EEG - suggestive of cortical dysfunction in the left hemisphere secondary to underlying stroke as well as mild diffuse encephalopathy. No seizures or definite epileptiform discharges were seen throughout the recording.  Continue keppra 500mg  bid  Hypertension  No hx of HTN, on no home meds  Placed On cleviprex gtt for BP control post IR, now off . On amlodipine 5SBP 150-160s . Long-term BP goal normotensive  Hyperlipidemia  Home meds:  No statin  LDL 88, goal < 70  Add lipitor 20  Continue statin at discharge  Diabetes type II, new diagnosis, Uncontrolled  HgbA1c 12.3, goal < 7.0  CBGs  SSI  Hyperglycemia, improved   On levemir  Likely hypercoagulable condition associated with uncontrolled DM  Dysphagia . Secondary to stroke . NPO . Speech, may need Cortrak Monday for TFs. For MBS   Other Stroke Risk Factors   Small remote appearing infarcts in the left frontal lobe by CT and MRI  Other Active Problems  Acute Respiratory Failure requiring intubation. Pt self extubated 04/15/19 - CCM on board  Hospital day # 4  I have personally obtained history,examined this patient, reviewed notes, independently viewed imaging studies, participated in medical decision making and plan of care.ROS completed by me personally and pertinent positives fully documented  I have made any additions or clarifications directly to the above note.  Plan continue mobilization out of bed.  Therapy consults.  Swallow eval by speech therapy today and if he fails will have Cortrack feeding tube placed.  Transfer to neurology floor  bed later today.  Discussed with patient and nurse at the bedside and answered questions.  No family available at the bedside for discussion.  Greater than 50% time during this 35-minute visit was spent on counseling and coordination of care about his stroke discussion about treatment plan and answering questions  Antony Contras, MD Medical Director [facility] Pager: [phone] 04/18/2019 2:05 PM   To contact Stroke Continuity provider, please refer to [email or web address] After hours, contact General Neurology

## 2019-04-18 NOTE — Progress Notes (Signed)
  Speech Language Pathology Treatment: Dysphagia;Cognitive-Linquistic  Patient Details Name: Shadee Montoya MRN: [id number] DOB: 1966/08/01 Today's Date: 04/18/2019 Time: 2111-7356 SLP Time Calculation (min) (ACUTE ONLY): 13 min  Assessment / Plan / Recommendation Clinical Impression  Pt exhibited increased vocalizations today. Imitated vowel sound with model, counted 1-5 in unison with SLP and visual cue. He stated the days of the week with 50% accuracy needing verbal cues to initiate first 2 days. Moderate dysarthria noted with decreased phoneme precision and intensity. He followed simple one step commands without difficulty.   He continues to show decrease swallow safety with thin marked by mildly delayed cough, throat clear and wet vocal quality and will require instrumental assessment which has been scheduled for 1330 today.    HPI HPI: Pt is a 53 y.o. M with no known PMH who presents with right sided weakness, decreased sensation on right, diplopia and headache. CT showing remote L frontal infarcts, CTA head/neck with L P2 occlusion, MRI with acute L PCA infarct including much of L thalamus, L > R cerebellum, R pons. Now s/p complete revascularization of occluded L PCA with mechanical thrombectomy. ETT 8/27-8/28; self extubated 8/28.      SLP Plan  MBS       Recommendations  Medication Administration: Via alternative means                Oral Care Recommendations: Oral care QID Follow up Recommendations: Inpatient Rehab;24 hour supervision/assistance SLP Visit Diagnosis: Aphasia (R47.01);Dysphagia, unspecified (R13.10) Plan: MBS                      Houston Siren 04/18/2019, 12:39 PM    [name].Ed Risk analyst [phone] Office [phone]

## 2019-04-18 NOTE — Progress Notes (Signed)
Modified Barium Swallow Progress Note  Patient Details  Name: Rodney Collier MRN: [id number] Date of Birth: Jan 20, 1966  Today's Date: 04/18/2019  Modified Barium Swallow completed.  Full report located under Chart Review in the Imaging Section.  Brief recommendations include the following:  Clinical Impression  Pt presented with R side facial paresis and oropharyngeal dysphagia. His swallowing function is characterized by anterior spillage on his R side, delayed swallow to the pyriform sinuses and aspiration. During consecutive sips of thin pt aspirated but was able to reflexively elicit a productive cough which cleared his airway. Nectar trials resulted in flash penetration to the vocal folds but spontaneously cleared during the swallow. Given concerns about pocketing and decreased bolus awareness on his R side, softer foods are advised for now. Provided education on the compensatory strategies of chewing on his stronger L side and performing a lingual sweep on the R after meals. Recommend D3 with nectar thick with meds crushed in puree with SLP f/u for compensatory strategy education and maintenance. Due to aphasia dx, pt requires supervision during meal times.    Swallow Evaluation Recommendations       SLP Diet Recommendations: Dysphagia 3 (Mech soft) solids;Nectar thick liquid   Liquid Administration via: Cup;Straw   Medication Administration: Whole meds with puree   Supervision: Staff to assist with self feeding   Compensations: Lingual sweep for clearance of pocketing;Monitor for anterior loss;Small sips/bites;Slow rate(Chew on R side, sweep on weak L side )   Postural Changes: Seated upright at 90 degrees   Oral Care Recommendations: Oral care BID        Houston Siren 04/18/2019,5:05 PM   [name] [place].Ed Risk analyst [phone] Office [phone]

## 2019-04-19 ENCOUNTER — Encounter (HOSPITAL_COMMUNITY): Payer: Self-pay | Admitting: Emergency Medicine

## 2019-04-19 ENCOUNTER — Inpatient Hospital Stay (HOSPITAL_COMMUNITY): Payer: Medicaid - Out of State

## 2019-04-19 ENCOUNTER — Encounter (HOSPITAL_COMMUNITY)
Admission: EM | Disposition: A | Discharge status: Home / Self Care | Payer: Self-pay | Source: Home / Self Care | Attending: Neurology

## 2019-04-19 HISTORY — PX: TEE WITHOUT CARDIOVERSION: SHX5443

## 2019-04-19 HISTORY — PX: BUBBLE STUDY: SHX6837

## 2019-04-19 LAB — RAPID URINE DRUG SCREEN, HOSP PERFORMED
Amphetamines: NOT DETECTED
Barbiturates: NOT DETECTED
Benzodiazepines: POSITIVE — AB
Cocaine: NOT DETECTED
Opiates: NOT DETECTED
Tetrahydrocannabinol: NOT DETECTED

## 2019-04-19 LAB — GLUCOSE, CAPILLARY
Glucose-Capillary: 105 mg/dL — ABNORMAL HIGH (ref 70–99)
Glucose-Capillary: 74 mg/dL (ref 70–99)
Glucose-Capillary: 92 mg/dL (ref 70–99)
Glucose-Capillary: 246 mg/dL — ABNORMAL HIGH (ref 70–99)
Glucose-Capillary: 176 mg/dL — ABNORMAL HIGH (ref 70–99)

## 2019-04-19 LAB — LUPUS ANTICOAGULANT PANEL
DRVVT: 35.3 s (ref 0.0–47.0)
PTT Lupus Anticoagulant: 26.8 s (ref 0.0–51.9)

## 2019-04-19 LAB — PROTEIN S, TOTAL: Protein S Ag, Total: 99 % (ref 60–150)

## 2019-04-19 LAB — PROTEIN S ACTIVITY: Protein S Activity: 66 % (ref 63–140)

## 2019-04-19 LAB — PROTEIN C ACTIVITY: Protein C Activity: 173 % (ref 73–180)

## 2019-04-19 SURGERY — ECHOCARDIOGRAM, TRANSESOPHAGEAL
Anesthesia: Moderate Sedation

## 2019-04-19 MED ORDER — FENTANYL CITRATE (PF) 100 MCG/2ML IJ SOLN
INTRAMUSCULAR | Status: DC | PRN
  Administered 2019-04-19 (×3): 25 ug via INTRAVENOUS

## 2019-04-19 MED ORDER — MIDAZOLAM HCL (PF) 5 MG/ML IJ SOLN
INTRAMUSCULAR | Status: AC
  Filled 2019-04-19: qty 2

## 2019-04-19 MED ORDER — FENTANYL CITRATE (PF) 100 MCG/2ML IJ SOLN
INTRAMUSCULAR | Status: AC
  Filled 2019-04-19: qty 4

## 2019-04-19 MED ORDER — SODIUM CHLORIDE 0.9 % IV SOLN: INTRAVENOUS | Status: DC

## 2019-04-19 MED ORDER — INSULIN ASPART 100 UNIT/ML ~~LOC~~ SOLN
0.0000 [IU] | Freq: Every day | SUBCUTANEOUS | Status: DC
  Administered 2019-04-21: 2 [IU] via SUBCUTANEOUS
  Administered 2019-04-22: 5 [IU] via SUBCUTANEOUS
  Administered 2019-04-23: 3 [IU] via SUBCUTANEOUS
  Administered 2019-04-24 – 2019-04-26 (×2): 2 [IU] via SUBCUTANEOUS

## 2019-04-19 MED ORDER — MIDAZOLAM HCL (PF) 10 MG/2ML IJ SOLN
INTRAMUSCULAR | Status: DC | PRN
  Administered 2019-04-19: 1 mg via INTRAVENOUS
  Administered 2019-04-19 (×2): 2 mg via INTRAVENOUS

## 2019-04-19 MED ORDER — INSULIN ASPART 100 UNIT/ML ~~LOC~~ SOLN
0.0000 [IU] | Freq: Three times a day (TID) | SUBCUTANEOUS | Status: DC
  Administered 2019-04-20 (×2): 3 [IU] via SUBCUTANEOUS
  Administered 2019-04-20 – 2019-04-21 (×2): 2 [IU] via SUBCUTANEOUS
  Administered 2019-04-21: 7 [IU] via SUBCUTANEOUS

## 2019-04-19 MED ORDER — DIPHENHYDRAMINE HCL 50 MG/ML IJ SOLN
INTRAMUSCULAR | Status: AC
  Filled 2019-04-19: qty 1

## 2019-04-19 NOTE — Progress Notes (Signed)
  Echocardiogram Echocardiogram Transesophageal has been performed.  Rodney Collier 04/19/2019, 11:40 AM

## 2019-04-19 NOTE — Progress Notes (Signed)
PT Cancellation Note  Patient Details Name: Kc Summerson MRN: [id number] DOB: [birth year]   Cancelled Treatment:    Reason Eval/Treat Not Completed: Patient at procedure or test/unavailable Pt off floor at TEE. Will follow.   Marguarite Arbour A Malachi Kinzler 04/19/2019, 12:12 PM Wray Kearns, PT, DPT Acute Rehabilitation Services Pager [phone] Office [phone]

## 2019-04-19 NOTE — H&P (Signed)
Rodney Collier is a 53 y.o. male who has presented today for surgery, with the diagnosis of stroke.  The various methods of treatment have been discussed with the patient and family. After consideration of risks, benefits and other options for treatment, the patient has consented to  Procedure(s): TRANSESOPHAGEAL ECHOCARDIOGRAM (TEE) (N/A) as a surgical intervention .  The patient's history has been reviewed, patient examined, no change in status, stable for surgery.  I have reviewed the patient's chart and labs.  Questions were answered to the patient's satisfaction.    Hurley Blevins C. Oval Linsey, MD, [facility]  04/19/2019 11:06 AM

## 2019-04-19 NOTE — Progress Notes (Signed)
STROKE TEAM PROGRESS NOTE   INTERVAL HISTORY  patient remains neurologically unchanged. He had TEE today which showed no PFO/clot or cardiac source of aneurysm.  Vitals:   04/19/19 1120 04/19/19 1125 04/19/19 1132 04/19/19 1133  BP: (!) 166/84 (!) 146/72 (!) 162/79   Pulse: 99 93  91  Resp: 18 (!) 21  18  Temp:    99.3 F (37.4 C)  TempSrc:    Temporal  SpO2: 97% 92%  100%  Weight:      Height:        CBC:  Recent Labs  Lab 04/14/19 1015  04/15/19 0545 04/15/19 0703  WBC 6.4  --   --  10.1  NEUTROABS 4.7  --   --  7.0  HGB 12.7*   < > 12.2* 12.3*  HCT 40.7   < > 36.0* 40.6  MCV 72.9*  --   --  74.2*  PLT 249  --   --  254   < > = values in this interval not displayed.    Basic Metabolic Panel:  Recent Labs  Lab 04/15/19 0703 04/16/19 0616  NA 138 140  K 4.8 3.6  CL 109 106  CO2 17* 23  GLUCOSE 151* 114*  BUN 10 8  CREATININE 0.82 0.83  CALCIUM 8.9 8.8*  MG 1.8  --   PHOS 2.6  --     IMAGING CT Head WO Contrast 04/16/2019 Areas of acute infarction are showing low-density and swelling but no macroscopic hemorrhage by CT. As shown by previous MRI, there is involvement of the cerebellum, left cerebral peduncle and thalamus and left posteromedial temporal lobe and occipital lobe. Tiny focus of infarction in the right occipital lobe as well.  [name] 04/15/2019 1. Acute infarction throughout the left PCA territory (including most of the left thalamus), left more than right cerebellum, and right pons. Petechial hemorrhage is seen at the level of the pons.  2. Prior left frontal infarcts and microvascular ischemic gliosis.   Cerebral angiogram 04/14/2019 4 vessel cerebral arteriogram followed by complete revascularization of occluded Lt PCA  With x 2 passes with 60mm x 74mm embotrap retriever  device achieving a TICI 3 revascularization.  Ct Code Stroke Cta Head W/wo Contrast Ct Code Stroke Cta Neck W/wo Contrast Ct Code Stroke Cta Cerebral  Perfusion W/wo Contrast 04/14/2019 1. Left P2 occlusion with downstream reconstitution. Qualitative assessment of CT perfusion maps shows ischemia without visible core infarct in the left occipital lobe. 2. Mild atherosclerosis in the neck without flow limiting stenosis or embolic source seen. 3. No large vessel occlusion in the anterior circulation. 4. Mild mid basilar narrowing.   Ct Head Code Stroke Wo Contrast 04/14/2019 1. No acute finding.  2. Small remote appearing infarcts in the left frontal lobe.    LE Doppler 04/15/2019 Right: There is no evidence of deep vein thrombosis in the lower extremity. No cystic structure found in the popliteal fossa. Left: There is no evidence of deep vein thrombosis in the lower extremity. No cystic structure found in the popliteal fossa.  2D Echocardiogram 04/15/2019  1. The left ventricle has normal systolic function, with an ejection fraction of 55-60%. The cavity size was normal. Left ventricular diastolic parameters were normal. No evidence of left ventricular regional wall motion abnormalities.  2. The right ventricle has normal systolic function. The cavity was normal. There is no increase in right ventricular wall thickness.  3. The mitral valve is abnormal. Mild thickening of the  mitral valve leaflet. The MR jet is posteriorly-directed.  4. The tricuspid valve is grossly normal.  5. The aortic valve is tricuspid. No stenosis of the aortic valve.  6. The aorta is normal unless otherwise noted.  7. The inferior vena cava was normal in size with <50% respiratory variability. SUMMARY LVEF 55-60%, normal wall thickness, normal wall motion, normal diastolic function, mild posteriorly directed MR, normal size IVC that does not collapse, no PFO by color doppler  FINDINGS  Left Ventricle: The left ventricle has normal systolic function, with an ejection fraction of 55-60%. The cavity size was normal. There is no increase in left ventricular wall  thickness. Left ventricular diastolic parameters were normal. Normal left ventricular filling pressures No evidence of left ventricular regional wall motion abnormalities..  EEG  04/15/2019 This study is suggestive of cortical dysfunction in the left hemisphere secondary to underlying stroke as well as mild diffuse encephalopathy. No seizures or definite epileptiform discharges were seen throughout the recording.  TCD w/ bubble 04/18/2019  No HTIS heard during rest. No HITS heard during valsalva.  TEE 04/19/2019 LVEF 55-60% No LA/LAA thrombus or mass No ASD or PFO by color flow Doppler or saline microcavitation study. Mild AR, mild MR.   PHYSICAL EXAM     General - Well nourished, well developed, African gentleman who is not in distress Ophthalmologic - fundi not visualized due to noncooperation. Lungs clear to auscultation Cardiovascular - Regular rate and rhythm. Distal pulses are well felt  Neurological Exam-awake, eyes open, following most of the simple commands. Eyes mid position, able to have left gaze with right eye nystagmus and can cross the midline slightly. PERRL, blinking on the left not on the right. Right facial droop, tongue protrusion not cooperative, against gravity of LUE but significant ataxia, able to follow simple commands with left hand. lifts left leg slightly off of the bed on command, follow simple commands of the left foot. On pain stimulation, slight withdraw of RLE but no movement of RUE or RLE. DTR 1+ and no babinski. Sensation and gait not tested.   ASSESSMENT/PLAN Rodney Collier is a 53 y.o. male with no significant past medical history presenting with right-sided weakness, decreased sensation on the right, diplopia and headache. Complete revascularization of occluded L PCA w/ mechanical thrombectomy.   Stroke: Multiple posterior circulation infarcts-bilateral  Cerebellar,Right Pons,embolic infarcts and most significant in the left P2 territory with P2  occlusion s/p IR with TICI3 reperfusion - embolic secondary midbasilar stenosis  Code Stroke CT head No acute abnormality. Old infarct L frontal lobe.   CTA head & neck L P2 occlusion. Mild neck atherosclerosis . No LVO anterior circulation. Mild mid BA narrowing.  CT perfusion ischemia w/o visible core  CT Head WO Contrast - 04/16/19 - Areas of acute infarction are showing low-density and swelling but no macroscopic hemorrhage by CT.  As shown by previous MRI, there is involvement of the cerebellum, left cerebral peduncle and thalamus and left posteromedial temporal lobe and occipital lobe. Tiny focus of infarction in the right occipital lobe as well.  Cerebral angio TICI reperfusion of occluded L PCA  Post IR CT brain no ICH.  MRI  L PCA (including thalamus), L>R cerebellum, R pontine and left midbrain infarct. Petechial hemorrhage seen in pons. Old L frontal infarcts. Small vessel disease.   LE Doppler no DVT - no evidence of deep vein thrombosis   2D Echo - EF 55-60%.  No cardiac source of emboli identified.   TCD  bubble no HITS  TEE neg  UDS - pending   EEG - suggestive of cortical dysfunction in the left hemisphere secondary to underlying stroke as well as mild diffuse encephalopathy. No seizures or definite epileptiform discharges were seen throughout the recording.  LDL 88  HgbA1c 12.3  SCDs for VTE prophylaxis  No antithrombotic prior to admission, now on aspirin 300 mg suppository daily. Once able to eat post TEE will change to aspirin 81 mg and plavix 75 mg daily x 3 weeks, then aspirin alone.   Therapy recommendations:  SNF and CIR - CIR consult in place  Disposition:  pending   Possible seizure  Full body rigid shaking hours post IR, L>R  Loaded with keppra  EEG - suggestive of cortical dysfunction in the left hemisphere secondary to underlying stroke as well as mild diffuse encephalopathy. No seizures or definite epileptiform discharges were seen throughout  the recording.  Continue keppra 500mg  bid  Hypertension  No hx of HTN, on no home meds  Placed On cleviprex gtt for BP control post IR, now off . On amlodipine 5  . SBP 150-160s . Long-term BP goal normotensive  Hyperlipidemia  Home meds:  No statin  LDL 88, goal < 70  Add lipitor 20  Continue statin at discharge  Diabetes type II, new diagnosis, Uncontrolled  HgbA1c 12.3, goal < 7.0  CBGs  SSI  Hyperglycemia, improved   On levemir  Likely hypercoagulable condition associated with uncontrolled DM  Dysphagia . Secondary to stroke . Speech on board . cleared for Dysphagia 3 nectar thick liquids with MBS   Other Stroke Risk Factors   Small remote appearing infarcts in the left frontal lobe by CT and MRI  Other Active Problems  Acute Respiratory Failure requiring intubation. Pt self extubated 04/15/19 - CCM on board  Hospital day # 5  Mobilize out of bed.Continue ongoing therapies.Hopefully transfer to rehab next few days.I have spent a total of   25 minutes with the patient reviewing hospital notes,  test results, labs and examining the patient as well as establishing an assessment and plan that was discussed personally with the patient.  > 50% of time was spent in direct patient care.       Delia HeadyPramod Sethi, MD Medical Director [facility] Pager: [phone] 04/19/2019 11:34 AM   To contact Stroke Continuity provider, please refer to [email or web address]. After hours, contact General Neurology

## 2019-04-19 NOTE — Progress Notes (Signed)
OT Cancellation Note  Patient Details Name: Gautham Hewins MRN: [id number] DOB: 1965/12/14   Cancelled Treatment:    Reason Eval/Treat Not Completed: Patient at procedure or test/ unavailable.  Will reattempt.  Lucille Passy, OTR/L Acute Rehabilitation Services Pager [phone] Office [phone]   Lucille Passy M 04/19/2019, 11:54 AM

## 2019-04-19 NOTE — CV Procedure (Signed)
Brief TEE Note  LVEF 55-60% No LA/LAA thrombus or mass No ASD or PFO by color flow Doppler or saline microcavitation study. Mild AR, mild MR.  For additional details see full report.   During this procedure the patient is administered a total of Versed 5 mg and Fentanyl 75 mcg to achieve and maintain moderate conscious sedation.  The patient's heart rate, blood pressure, and oxygen saturation are monitored continuously during the procedure. The period of conscious sedation is 12 minutes, of which I was present face-to-face 100% of this time.   Yarnell Arvidson C. Oval Linsey, MD, [facility] 04/19/2019 11:29 AM

## 2019-04-19 NOTE — Progress Notes (Signed)
    [facility] has been requested to perform a transesophageal echocardiogram on 09/01 for CVA.  After careful review of history and examination, the risks and benefits of transesophageal echocardiogram have been explained including risks of esophageal damage, perforation (1:10,000 risk), bleeding, pharyngeal hematoma as well as other potential complications associated with conscious sedation including aspiration, arrhythmia, respiratory failure and death. Alternatives to treatment were discussed, questions were answered. Patient is willing to proceed.   Although pt has problems w/ speech, dysarthria, he indicates understanding and agrees to proceed.  Rosaria Ferries, PA-C 04/19/2019 9:04 AM

## 2019-04-20 ENCOUNTER — Inpatient Hospital Stay (HOSPITAL_COMMUNITY): Payer: Medicaid - Out of State

## 2019-04-20 LAB — URINALYSIS, ROUTINE W REFLEX MICROSCOPIC
Bilirubin Urine: NEGATIVE
Glucose, UA: >=500 mg/dL — AB
Ketones, ur: NEGATIVE mg/dL
Nitrite: NEGATIVE
Protein, ur: NEGATIVE mg/dL
RBC / HPF: >50 RBC/hpf — ABNORMAL HIGH (ref 0–5)
Specific Gravity, Urine: 1.005 (ref 1.005–1.030)
WBC, UA: >50 WBC/hpf — ABNORMAL HIGH (ref 0–5)
pH: 8 (ref 5.0–8.0)

## 2019-04-20 LAB — CBC
HCT: 38.4 % — ABNORMAL LOW (ref 39.0–52.0)
Hemoglobin: 11.9 g/dL — ABNORMAL LOW (ref 13.0–17.0)
MCH: 22.5 pg — ABNORMAL LOW (ref 26.0–34.0)
MCHC: 31 g/dL (ref 30.0–36.0)
MCV: 72.7 fL — ABNORMAL LOW (ref 80.0–100.0)
Platelets: 275 10*3/uL (ref 150–400)
RBC: 5.28 MIL/uL (ref 4.22–5.81)
RDW: 13.9 % (ref 11.5–15.5)
WBC: 7.7 10*3/uL (ref 4.0–10.5)
nRBC: 0 % (ref 0.0–0.2)

## 2019-04-20 LAB — GLUCOSE, CAPILLARY
Glucose-Capillary: 157 mg/dL — ABNORMAL HIGH (ref 70–99)
Glucose-Capillary: 182 mg/dL — ABNORMAL HIGH (ref 70–99)
Glucose-Capillary: 184 mg/dL — ABNORMAL HIGH (ref 70–99)
Glucose-Capillary: 203 mg/dL — ABNORMAL HIGH (ref 70–99)
Glucose-Capillary: 231 mg/dL — ABNORMAL HIGH (ref 70–99)
Glucose-Capillary: 250 mg/dL — ABNORMAL HIGH (ref 70–99)
Glucose-Capillary: 262 mg/dL — ABNORMAL HIGH (ref 70–99)

## 2019-04-20 MED ORDER — GLUCERNA SHAKE PO LIQD
237.0000 mL | Freq: Three times a day (TID) | ORAL | Status: DC
  Administered 2019-04-20 – 2019-06-18 (×134): 237 mL via ORAL
  Filled 2019-04-20 (×3): qty 237
  Filled 2019-04-20: qty 474
  Filled 2019-04-20 (×8): qty 237
  Filled 2019-04-20: qty 474
  Filled 2019-04-20 (×4): qty 237

## 2019-04-20 NOTE — Progress Notes (Signed)
Nutrition Follow-up  DOCUMENTATION CODES:   Not applicable  INTERVENTION:  Provide Glucerna Shake po TID (thickened to nectar thick consistency), each supplement provides 220 kcal and 10 grams of protein.  Encourage adequate PO intake.   NUTRITION DIAGNOSIS:   Inadequate oral intake related to inability to eat as evidenced by NPO status; diet advanced; improved  GOAL:   Patient will meet greater than or equal to 90% of their needs; progressing  MONITOR:   PO intake, Supplement acceptance, Diet advancement, Skin, Weight trends, Labs, I & O's  REASON FOR ASSESSMENT:   Ventilator    ASSESSMENT:   [name] with no significant past medical history presenting with right-sided weakness, decreased sensation on the right, diplopia and headache. Complete revascularization of occluded L PCA w/ mechanical thrombectomy. Self extubated 8/28. TEE 9/1 which showed no PFO/clot or cardiac source of aneurysm. Pt with new type 2 diabetes diagnosis. HgbA1c 12.3%.   Pt is currently on a dysphagia 3 diet with nectar thick liquids. Meal completion has been 50%. RD to order nutritional supplements to aid in caloric and protein needs.   Labs and medications reviewed.   Diet Order:   Diet Order            DIET DYS 3 Room service appropriate? Yes; Fluid consistency: Nectar Thick  Diet effective now              EDUCATION NEEDS:   Not appropriate for education at this time  Skin:  Skin Assessment: Reviewed RN Assessment Skin Integrity Issues:: Other (Comment) Other: lt thigh puncture wound  Last BM:  Unknown  Height:   Ht Readings from Last 1 Encounters:  04/19/19 6' (1.829 m)    Weight:   Wt Readings from Last 1 Encounters:  04/19/19 77 kg    Ideal Body Weight:  80.9 kg  BMI:  Body mass index is 23.02 kg/m.  Estimated Nutritional Needs:   Kcal:  2000-2200  Protein:  100-115 grams  Fluid:  > 2.0 L    Rodney Parker, MS, RD, LDN Pager  # [phone] After hours/ weekend pager # [phone]

## 2019-04-20 NOTE — Progress Notes (Signed)
Inpatient Rehab Admissions Coordinator:    I was able to speak with daughter, [name], today. She and her mother and sister are willing/able to provide 24/7 assist to pt at discharge, and plan for him to move in with them in [place].  They have questions regarding medicaid screen, which I will follow up on tomorrow.   Shann Medal, PT, DPT Admissions Coordinator [phone] 04/20/19  4:54 PM

## 2019-04-20 NOTE — Progress Notes (Signed)
STROKE TEAM PROGRESS NOTE   INTERVAL HISTORY  patient remains neurologically unchanged. He has fever temp 100.7 this am  Vitals:   04/20/19 0024 04/20/19 0409 04/20/19 0843 04/20/19 1123  BP: (!) 162/86 137/83 (!) 144/86 (!) 161/89  Pulse: 88 84 89 93  Resp: 20 18 16 16   Temp: 98.7 F (37.1 C) 98.7 F (37.1 C) 99.4 F (37.4 C) (!) 100.7 F (38.2 C)  TempSrc: Oral Oral Oral Axillary  SpO2: 99% 98% 100% 100%  Weight:      Height:        CBC:  Recent Labs  Lab 04/14/19 1015  04/15/19 0545 04/15/19 0703  WBC 6.4  --   --  10.1  NEUTROABS 4.7  --   --  7.0  HGB 12.7*   < > 12.2* 12.3*  HCT 40.7   < > 36.0* 40.6  MCV 72.9*  --   --  74.2*  PLT 249  --   --  254   < > = values in this interval not displayed.    Basic Metabolic Panel:  Recent Labs  Lab 04/15/19 0703 04/16/19 0616  NA 138 140  K 4.8 3.6  CL 109 106  CO2 17* 23  GLUCOSE 151* 114*  BUN 10 8  CREATININE 0.82 0.83  CALCIUM 8.9 8.8*  MG 1.8  --   PHOS 2.6  --     IMAGING CT Head WO Contrast 04/16/2019 Areas of acute infarction are showing low-density and swelling but no macroscopic hemorrhage by CT. As shown by previous MRI, there is involvement of the cerebellum, left cerebral peduncle and thalamus and left posteromedial temporal lobe and occipital lobe. Tiny focus of infarction in the right occipital lobe as well.  [name] 04/15/2019 1. Acute infarction throughout the left PCA territory (including most of the left thalamus), left more than right cerebellum, and right pons. Petechial hemorrhage is seen at the level of the pons.  2. Prior left frontal infarcts and microvascular ischemic gliosis.   Cerebral angiogram 04/14/2019 4 vessel cerebral arteriogram followed by complete revascularization of occluded Lt PCA  With x 2 passes with 28mm x 28mm embotrap retriever  device achieving a TICI 3 revascularization.  Ct Code Stroke Cta Head W/wo Contrast Ct Code Stroke Cta Neck W/wo  Contrast Ct Code Stroke Cta Cerebral Perfusion W/wo Contrast 04/14/2019 1. Left P2 occlusion with downstream reconstitution. Qualitative assessment of CT perfusion maps shows ischemia without visible core infarct in the left occipital lobe. 2. Mild atherosclerosis in the neck without flow limiting stenosis or embolic source seen. 3. No large vessel occlusion in the anterior circulation. 4. Mild mid basilar narrowing.   Ct Head Code Stroke Wo Contrast 04/14/2019 1. No acute finding.  2. Small remote appearing infarcts in the left frontal lobe.    LE Doppler 04/15/2019 Right: There is no evidence of deep vein thrombosis in the lower extremity. No cystic structure found in the popliteal fossa. Left: There is no evidence of deep vein thrombosis in the lower extremity. No cystic structure found in the popliteal fossa.  2D Echocardiogram 04/15/2019  1. The left ventricle has normal systolic function, with an ejection fraction of 55-60%. The cavity size was normal. Left ventricular diastolic parameters were normal. No evidence of left ventricular regional wall motion abnormalities.  2. The right ventricle has normal systolic function. The cavity was normal. There is no increase in right ventricular wall thickness.  3. The mitral valve is abnormal. Mild  thickening of the mitral valve leaflet. The MR jet is posteriorly-directed.  4. The tricuspid valve is grossly normal.  5. The aortic valve is tricuspid. No stenosis of the aortic valve.  6. The aorta is normal unless otherwise noted.  7. The inferior vena cava was normal in size with <50% respiratory variability. SUMMARY LVEF 55-60%, normal wall thickness, normal wall motion, normal diastolic function, mild posteriorly directed MR, normal size IVC that does not collapse, no PFO by color doppler  FINDINGS  Left Ventricle: The left ventricle has normal systolic function, with an ejection fraction of 55-60%. The cavity size was normal. There is no  increase in left ventricular wall thickness. Left ventricular diastolic parameters were normal. Normal left ventricular filling pressures No evidence of left ventricular regional wall motion abnormalities..  EEG  04/15/2019 This study is suggestive of cortical dysfunction in the left hemisphere secondary to underlying stroke as well as mild diffuse encephalopathy. No seizures or definite epileptiform discharges were seen throughout the recording.  TCD w/ bubble 04/18/2019  No HTIS heard during rest. No HITS heard during valsalva.  TEE 04/19/2019 LVEF 55-60% No LA/LAA thrombus or mass No ASD or PFO by color flow Doppler or saline microcavitation study. Mild AR, mild MR.   PHYSICAL EXAM     General - Well nourished, well developed, African gentleman who is not in distress Ophthalmologic - fundi not visualized due to noncooperation. Lungs clear to auscultation Cardiovascular - Regular rate and rhythm. Distal pulses are well felt  Neurological Exam-awake, eyes open, following most of the simple commands. Eyes mid position, able to have left gaze with right eye nystagmus and can cross the midline slightly. PERRL, blinking on the left not on the right. Right facial droop, tongue protrusion not cooperative, against gravity of LUE but significant ataxia, able to follow simple commands with left hand. lifts left leg slightly off of the bed on command, follow simple commands of the left foot. On pain stimulation, slight withdraw of RLE but no movement of RUE or RLE. DTR 1+ and no babinski. Sensation and gait not tested.   ASSESSMENT/PLAN [name] with no significant past medical history presenting with right-sided weakness, decreased sensation on the right, diplopia and headache. Complete revascularization of occluded L PCA w/ mechanical thrombectomy.   Stroke: Multiple posterior circulation infarcts-bilateral  Cerebellar,Right Pons,embolic infarcts and most significant  in the left P2 territory with P2 occlusion s/p IR with TICI3 reperfusion - embolic secondary midbasilar stenosis  Code Stroke CT head No acute abnormality. Old infarct L frontal lobe.   CTA head & neck L P2 occlusion. Mild neck atherosclerosis . No LVO anterior circulation. Mild mid BA narrowing.  CT perfusion ischemia w/o visible core  CT Head WO Contrast - 04/16/19 - Areas of acute infarction are showing low-density and swelling but no macroscopic hemorrhage by CT.  As shown by previous MRI, there is involvement of the cerebellum, left cerebral peduncle and thalamus and left posteromedial temporal lobe and occipital lobe. Tiny focus of infarction in the right occipital lobe as well.  Cerebral angio TICI reperfusion of occluded L PCA  Post IR CT brain no ICH.  MRI  L PCA (including thalamus), L>R cerebellum, R pontine and left midbrain infarct. Petechial hemorrhage seen in pons. Old L frontal infarcts. Small vessel disease.   LE Doppler no DVT - no evidence of deep vein thrombosis   2D Echo - EF 55-60%.  No cardiac source of emboli identified.  TCD bubble no HITS  TEE neg  UDS - pending   EEG - suggestive of cortical dysfunction in the left hemisphere secondary to underlying stroke as well as mild diffuse encephalopathy. No seizures or definite epileptiform discharges were seen throughout the recording.  LDL 88  HgbA1c 12.3  SCDs for VTE prophylaxis  No antithrombotic prior to admission, now on aspirin 300 mg suppository daily. Once able to eat post TEE will change to aspirin 81 mg and plavix 75 mg daily x 3 weeks, then aspirin alone.   Therapy recommendations:  SNF and CIR - CIR consult in place  Disposition:  pending   Possible seizure  Full body rigid shaking hours post IR, L>R  Loaded with keppra  EEG - suggestive of cortical dysfunction in the left hemisphere secondary to underlying stroke as well as mild diffuse encephalopathy. No seizures or definite epileptiform  discharges were seen throughout the recording.  Continue keppra 500mg  bid  Hypertension  No hx of HTN, on no home meds  Placed On cleviprex gtt for BP control post IR, now off . On amlodipine 5  . SBP 150-160s . Long-term BP goal normotensive  Hyperlipidemia  Home meds:  No statin  LDL 88, goal < 70  Add lipitor 20  Continue statin at discharge  Diabetes type II, new diagnosis, Uncontrolled  HgbA1c 12.3, goal < 7.0  CBGs  SSI  Hyperglycemia, improved   On levemir  Likely hypercoagulable condition associated with uncontrolled DM  Dysphagia . Secondary to stroke . Speech on board . cleared for Dysphagia 3 nectar thick liquids with MBS   Other Stroke Risk Factors   Small remote appearing infarcts in the left frontal lobe by CT and MRI  Other Active Problems  Acute Respiratory Failure requiring intubation. Pt self extubated 04/15/19 - CCM on board  Hospital day # 6  Mobilize out of bed.Continue ongoing therapies. Check Ua, urine c/s, cbc , chest xrayand BMP for fever.Hopefully transfer to rehab next few days.I have spent a total of   25 minutes with the patient reviewing hospital notes,  test results, labs and examining the patient as well as establishing an assessment and plan that was discussed personally with the patient.  > 50% of time was spent in direct patient care.       Delia HeadyPramod Lekesha Claw, MD Medical Director [facility] Pager: [phone] 04/20/2019 3:13 PM   To contact Stroke Continuity provider, please refer to [email or web address]. After hours, contact General Neurology

## 2019-04-20 NOTE — Progress Notes (Signed)
Occupational Therapy Treatment Patient Details Name: Rodney Collier MRN: [id number] DOB: [birth year] ToStarr Sinclairday's Date: 04/20/2019    History of present illness Pt is a 53 y.o. M with no known PMH who presents with right sided weakness, decreased sensation on right, diplopia and headache. CT showing remote L frontal infarcts, CTA head/neck with L P2 occlusion, MRI with acute L PCA infarct including much of L thalamus, L > R cerebellum, R pons. Now s/p complete revascularization of occluded L PCA with mechanical thrombectomy. ETT 8/27-8/28; self extubated 8/28.   OT comments  Pt nods head "yes" when asked about double vision this session. Pt agreeable to co-treatment with OT and PT. Pt able to maintain static sitting balance with min - mod A. Pt standing into stedy for several minutes for weight shift and midline orientation. Stedy utilized to transfer pt into Production designer, theatre/television/filmrecliner chair. Pt continues to not attend to R side throughout session and needing mod cuing to locate items at midline for breakfast. Pt would continue to benefit from OT intervention to address functional deficits.   Follow Up Recommendations  SNF    Equipment Recommendations  Other (comment)(defer to next venue of care)       Precautions / Restrictions Precautions Precautions: Fall Precaution Comments: R hemiparesis and neglect Restrictions Weight Bearing Restrictions: No       Mobility Bed Mobility Overal bed mobility: Needs Assistance Bed Mobility: Supine to Sit     Supine to sit: Max assist;+2 for physical assistance        Transfers Overall transfer level: Needs assistance   Transfers: Sit to/from Stand Sit to Stand: +2 physical assistance;Min assist;Mod assist         General transfer comment: min - mod of 2 standing from bed level into stedy.    Balance Overall balance assessment: Needs assistance Sitting-balance support: Feet supported;Single extremity supported Sitting balance-Leahy Scale: Poor Sitting  balance - Comments: min - mod A balance on EOB.   Standing balance support: Bilateral upper extremity supported Standing balance-Leahy Scale: Poor Standing balance comment: dependent on physical assist- working on weight shift while standing in stedy               ADL either performed or assessed with clinical judgement   ADL Overall ADL's : Needs assistance/impaired Eating/Feeding: Set up;Sitting        General ADL Comments: mod cuing for pt to locate drink at midline. Pt needing assistance to open containers but bringing to mouth with L UE and min cuing for small sips.     Vision   Vision Assessment?: Vision impaired- to be further tested in functional context          Cognition Arousal/Alertness: Awake/alert Behavior During Therapy: Flat affect Overall Cognitive Status: Impaired/Different from baseline Area of Impairment: Problem solving;Following commands;Safety/judgement      Following Commands: Follows one step commands with increased time;Follows one step commands consistently Safety/Judgement: Decreased awareness of safety;Decreased awareness of deficits   Problem Solving: Slow processing;Difficulty sequencing;Requires verbal cues;Requires tactile cues;Decreased initiation General Comments: pt says a few words this session but not alway intelligible. Unable to utilize R UE unless with hand over hand assist. Pt does not attend to R side                   Pertinent Vitals/ Pain       Pain Assessment: Faces Faces Pain Scale: No hurt         Frequency  Min 2X/week  Progress Toward Goals  OT Goals(current goals can now be found in the care plan section)  Progress towards OT goals: Progressing toward goals  Acute Rehab OT Goals Patient Stated Goal: unable OT Goal Formulation: Patient unable to participate in goal setting Time For Goal Achievement: 05/04/19 Potential to Achieve Goals: Good  Plan Discharge plan remains appropriate     Co-evaluation    PT/OT/SLP Co-Evaluation/Treatment: Yes Reason for Co-Treatment: Complexity of the patient's impairments (multi-system involvement);Necessary to address cognition/behavior during functional activity;For patient/therapist safety;To address functional/ADL transfers   OT goals addressed during session: ADL's and self-care      AM-PAC OT "6 Clicks" Daily Activity     Outcome Measure   Help from another person eating meals?: A Lot Help from another person taking care of personal grooming?: Total Help from another person toileting, which includes using toliet, bedpan, or urinal?: Total Help from another person bathing (including washing, rinsing, drying)?: Total Help from another person to put on and taking off regular upper body clothing?: Total Help from another person to put on and taking off regular lower body clothing?: Total 6 Click Score: 7    End of Session Equipment Utilized During Treatment: Gait belt(stedy)  OT Visit Diagnosis: Unsteadiness on feet (R26.81);Hemiplegia and hemiparesis Hemiplegia - Right/Left: Right   Activity Tolerance Patient tolerated treatment well   Patient Left in chair;with chair alarm set;with call bell/phone within reach   Nurse Communication Precautions        Time: 6948-5462 OT Time Calculation (min): 26 min  Charges: OT General Charges $OT Visit: 1 Visit OT Treatments $Self Care/Home Management : 8-22 mins  La Shehan P, MS, OTR/L 04/20/2019, 9:23 AM

## 2019-04-20 NOTE — Progress Notes (Signed)
  Speech Language Pathology Treatment: Cognitive-Linquistic  Patient Details Name: Rodney Collier MRN: [id number] DOB: 06-Jun-1966 Today's Date: 04/20/2019 Time: 4097-3532 SLP Time Calculation (min) (ACUTE ONLY): 24 min  Assessment / Plan / Recommendation Clinical Impression  Pt was seen for aphasia treatment and was cooperative throughout the session. Verbal output continues to improve and he spontaneously produced some short phrases in response to the SLP's questions. However, speech intelligibility was notably reduced secondary to dysarthria with significantly reduced articulatory precision, a reduced vocal intensity, a breathy vocal quality, and reduced breath support. Pt was able to sustain vowels for less that 3 seconds despite cues to increase breath support. He was able to provide his name and completed verbally-presented phrases with 50% accuracy increasing to 83% accuracy with phonemic cues. He responded to moderately complex yes/no questions with 60% accuracy. He achieved 33% accuracy with responsive naming despite cues. Confrontational naming was attempted but pt reported that he was unable to see the presented items well. SLP will continue to follow pt.    HPI HPI: Pt is a 53 y.o. M with no known PMH who presents with right sided weakness, decreased sensation on right, diplopia and headache. CT showing remote L frontal infarcts, CTA head/neck with L P2 occlusion, MRI with acute L PCA infarct including much of L thalamus, L > R cerebellum, R pons. Now s/p complete revascularization of occluded L PCA with mechanical thrombectomy. ETT 8/27-8/28; self extubated 8/28.      SLP Plan  Continue with current plan of care       Recommendations                   Follow up Recommendations: Inpatient Rehab;24 hour supervision/assistance SLP Visit Diagnosis: Aphasia (R47.01);Dysarthria and anarthria (R47.1) Plan: Continue with current plan of care       Shanigua Gibb I. Hardin Negus, [place],  [place] Office number [phone] Pager [phone]                Rodney Collier 04/20/2019, 10:43 AM

## 2019-04-20 NOTE — Progress Notes (Signed)
Physical Therapy Treatment Patient Details Name: Gill Delrossi MRN: [id number] DOB: 06/09/1966 Today's Date: 04/20/2019    History of Present Illness Pt is a 53 y.o. M with no known PMH who presents with right sided weakness, decreased sensation on right, diplopia and headache. CT showing remote L frontal infarcts, CTA head/neck with L P2 occlusion, MRI with acute L PCA infarct including much of L thalamus, L > R cerebellum, R pons. Now s/p complete revascularization of occluded L PCA with mechanical thrombectomy. ETT 8/27-8/28; self extubated 8/28.    PT Comments    Pt progressing towards physical therapy goals. Was able to perform transfers with +2 assist and Stedy. Stedy was helpful as pt was able to pull to stand, and provided knee block for RLE during weight shifting activity. Pre-gait activity completed on [employer]. Will continue to follow and progress as able per POC.     Follow Up Recommendations  CIR;Supervision/Assistance - 24 hour     Equipment Recommendations  Other (comment)(TBD determined at next venue)    Recommendations for Other Services Rehab consult     Precautions / Restrictions Precautions Precautions: Fall Precaution Comments: R hemiparesis and neglect Restrictions Weight Bearing Restrictions: No    Mobility  Bed Mobility Overal bed mobility: Needs Assistance Bed Mobility: Supine to Sit     Supine to sit: Max assist;+2 for physical assistance Sit to supine: Max assist;+2 for physical assistance   General bed mobility comments: with max directional verbal cues pt did use L UE and LE to initiate rolling to the R. Pt unable to push up with R UE into sitting, maxAx2 for trunk elevation  Transfers Overall transfer level: Needs assistance Equipment used: (2 person lift with gait belt) Transfers: Sit to/from Stand Sit to Stand: +2 physical assistance;Min assist;Mod assist         General transfer comment: min - mod of 2 standing from bed level  into stedy.  Ambulation/Gait             General Gait Details: unable at this time. pre-gait activity of weight shifts in [place]. pt attempted to begin shifting weight to R to be able to lift L heel but unable to complete.    Stairs             Wheelchair Mobility    Modified Rankin (Stroke Patients Only) Modified Rankin (Stroke Patients Only) Pre-Morbid Rankin Score: No symptoms Modified Rankin: Severe disability     Balance Overall balance assessment: Needs assistance Sitting-balance support: Feet supported;Single extremity supported Sitting balance-Leahy Scale: Poor Sitting balance - Comments: min - mod A balance on EOB.   Standing balance support: Bilateral upper extremity supported Standing balance-Leahy Scale: Poor Standing balance comment: dependent on physical assist- working on weight shift while standing in stedy                            Cognition Arousal/Alertness: Awake/alert Behavior During Therapy: Flat affect Overall Cognitive Status: Impaired/Different from baseline Area of Impairment: Problem solving;Following commands;Safety/judgement;Orientation;Attention;Memory;Awareness                 Orientation Level: (Did not answer any orientation ??'s but responded to [name]) Current Attention Level: Sustained Memory: Decreased recall of precautions Following Commands: Follows one step commands with increased time;Follows one step commands consistently;Follows multi-step commands inconsistently Safety/Judgement: Decreased awareness of safety;Decreased awareness of deficits Awareness: Intellectual Problem Solving: Slow processing;Difficulty sequencing;Requires verbal cues;Requires tactile cues;Decreased initiation General Comments: pt  says a few words this session but not alway intelligible. Unable to utilize R UE unless with hand over hand assist. Pt does not attend to R side      Exercises      General Comments         Pertinent Vitals/Pain Pain Assessment: Faces Faces Pain Scale: No hurt Pain Intervention(s): Monitored during session    Home Living                      Prior Function            PT Goals (current goals can now be found in the care plan section) Acute Rehab PT Goals Patient Stated Goal: unable PT Goal Formulation: Patient unable to participate in goal setting Time For Goal Achievement: 04/30/19 Potential to Achieve Goals: Fair Progress towards PT goals: Progressing toward goals    Frequency    Min 4X/week      PT Plan Current plan remains appropriate    Co-evaluation PT/OT/SLP Co-Evaluation/Treatment: Yes Reason for Co-Treatment: Complexity of the patient's impairments (multi-system involvement);Necessary to address cognition/behavior during functional activity;For patient/therapist safety;To address functional/ADL transfers PT goals addressed during session: Mobility/safety with mobility;Balance;Proper use of DME;Strengthening/ROM        AM-PAC PT "6 Clicks" Mobility   Outcome Measure  Help needed turning from your back to your side while in a flat bed without using bedrails?: A Lot Help needed moving from lying on your back to sitting on the side of a flat bed without using bedrails?: Total Help needed moving to and from a bed to a chair (including a wheelchair)?: Total Help needed standing up from a chair using your arms (e.g., wheelchair or bedside chair)?: A Lot Help needed to walk in hospital room?: Total Help needed climbing 3-5 steps with a railing? : Total 6 Click Score: 8    End of Session Equipment Utilized During Treatment: Gait belt Activity Tolerance: Patient limited by lethargy Patient left: in bed;with call bell/phone within reach;with bed alarm set;Other (comment) Nurse Communication: Mobility status PT Visit Diagnosis: Other symptoms and signs involving the nervous system (R29.898);Other abnormalities of gait and mobility  (R26.89);Hemiplegia and hemiparesis Hemiplegia - Right/Left: Right Hemiplegia - dominant/non-dominant: Dominant Hemiplegia - caused by: Cerebral infarction     Time: [id number] PT Time Calculation (min) (ACUTE ONLY): 25 min  Charges:  $Gait Training: 8-22 mins                     Conni SlipperLaura Onesti Bonfiglio, PT, DPT Acute Rehabilitation Services Pager: [phone] Office: [phone]    Marylynn PearsonLaura D Nakari Bracknell 04/20/2019, 1:54 PM

## 2019-04-21 LAB — BASIC METABOLIC PANEL
Anion gap: 13 (ref 5–15)
BUN: 5 mg/dL — ABNORMAL LOW (ref 6–20)
CO2: 23 mmol/L (ref 22–32)
Calcium: 8.7 mg/dL — ABNORMAL LOW (ref 8.9–10.3)
Chloride: 102 mmol/L (ref 98–111)
Creatinine, Ser: 0.79 mg/dL (ref 0.61–1.24)
GFR calc Af Amer: >60 mL/min (ref >60)
GFR calc non Af Amer: >60 mL/min (ref >60)
Glucose, Bld: 241 mg/dL — ABNORMAL HIGH (ref 70–99)
Potassium: 3.5 mmol/L (ref 3.5–5.1)
Sodium: 138 mmol/L (ref 135–145)

## 2019-04-21 LAB — PROTHROMBIN GENE MUTATION

## 2019-04-21 LAB — GLUCOSE, CAPILLARY
Glucose-Capillary: 212 mg/dL — ABNORMAL HIGH (ref 70–99)
Glucose-Capillary: 322 mg/dL — ABNORMAL HIGH (ref 70–99)
Glucose-Capillary: 284 mg/dL — ABNORMAL HIGH (ref 70–99)

## 2019-04-21 LAB — FACTOR 5 LEIDEN

## 2019-04-21 MED ORDER — ASPIRIN 325 MG PO TABS: 325.0000 mg | ORAL_TABLET | Freq: Every day | ORAL | Status: DC

## 2019-04-21 MED ORDER — ENOXAPARIN SODIUM 40 MG/0.4ML ~~LOC~~ SOLN
40.0000 mg | SUBCUTANEOUS | Status: DC
  Administered 2019-04-21 – 2019-06-11 (×52): 40 mg via SUBCUTANEOUS
  Filled 2019-04-21 (×54): qty 0.4

## 2019-04-21 MED ORDER — LEVETIRACETAM 100 MG/ML PO SOLN
500.0000 mg | Freq: Two times a day (BID) | ORAL | Status: DC
  Administered 2019-04-21 – 2019-06-07 (×93): 500 mg via ORAL
  Filled 2019-04-21 (×99): qty 5

## 2019-04-21 MED ORDER — ASPIRIN EC 81 MG PO TBEC
81.0000 mg | DELAYED_RELEASE_TABLET | Freq: Every day | ORAL | Status: DC
  Administered 2019-04-22 – 2019-06-11 (×51): 81 mg via ORAL
  Filled 2019-04-21 (×57): qty 1

## 2019-04-21 MED ORDER — SODIUM CHLORIDE 0.9 % IV SOLN
1.0000 g | INTRAVENOUS | Status: DC
  Administered 2019-04-21: 1 g via INTRAVENOUS
  Filled 2019-04-21 (×2): qty 10

## 2019-04-21 MED ORDER — INSULIN ASPART 100 UNIT/ML ~~LOC~~ SOLN: 0.0000 [IU] | Freq: Every day | SUBCUTANEOUS | Status: DC

## 2019-04-21 MED ORDER — SENNOSIDES-DOCUSATE SODIUM 8.6-50 MG PO TABS
1.0000 | ORAL_TABLET | Freq: Every evening | ORAL | Status: DC | PRN
  Administered 2019-04-23 – 2019-06-09 (×4): 1 via ORAL
  Filled 2019-04-21 (×4): qty 1

## 2019-04-21 MED ORDER — CLOPIDOGREL BISULFATE 75 MG PO TABS
75.0000 mg | ORAL_TABLET | Freq: Every day | ORAL | Status: AC
  Administered 2019-04-21 – 2019-05-11 (×21): 75 mg via ORAL
  Filled 2019-04-21 (×21): qty 1

## 2019-04-21 MED ORDER — INSULIN ASPART 100 UNIT/ML ~~LOC~~ SOLN
0.0000 [IU] | Freq: Three times a day (TID) | SUBCUTANEOUS | Status: DC
  Administered 2019-04-21 – 2019-04-22 (×3): 8 [IU] via SUBCUTANEOUS
  Administered 2019-04-22 – 2019-04-23 (×2): 5 [IU] via SUBCUTANEOUS
  Administered 2019-04-23: 11 [IU] via SUBCUTANEOUS
  Administered 2019-04-24 (×3): 8 [IU] via SUBCUTANEOUS
  Administered 2019-04-25: 13:00:00 5 [IU] via SUBCUTANEOUS
  Administered 2019-04-25: 2 [IU] via SUBCUTANEOUS
  Administered 2019-04-25: 8 [IU] via SUBCUTANEOUS
  Administered 2019-04-26: 3 [IU] via SUBCUTANEOUS
  Administered 2019-04-26: 5 [IU] via SUBCUTANEOUS
  Administered 2019-04-26: 2 [IU] via SUBCUTANEOUS
  Administered 2019-04-27: 3 [IU] via SUBCUTANEOUS
  Administered 2019-04-27 – 2019-04-28 (×2): 2 [IU] via SUBCUTANEOUS
  Administered 2019-04-28: 13:00:00 3 [IU] via SUBCUTANEOUS
  Administered 2019-04-28: 11:00:00 1 [IU] via SUBCUTANEOUS
  Administered 2019-04-29: 10:00:00 5 [IU] via SUBCUTANEOUS
  Administered 2019-04-29 – 2019-05-01 (×3): 3 [IU] via SUBCUTANEOUS
  Administered 2019-05-01 – 2019-05-02 (×3): 2 [IU] via SUBCUTANEOUS

## 2019-04-21 MED ORDER — LEVETIRACETAM 500 MG PO TABS: 500.0000 mg | ORAL_TABLET | Freq: Two times a day (BID) | ORAL | Status: DC

## 2019-04-21 MED ORDER — AMLODIPINE BESYLATE 5 MG PO TABS
5.0000 mg | ORAL_TABLET | Freq: Every day | ORAL | Status: DC
  Administered 2019-04-22 – 2019-04-23 (×2): 5 mg via ORAL
  Filled 2019-04-21 (×2): qty 1

## 2019-04-21 MED ORDER — ATORVASTATIN CALCIUM 10 MG PO TABS
20.0000 mg | ORAL_TABLET | Freq: Every day | ORAL | Status: DC
  Administered 2019-04-21 – 2019-06-11 (×50): 20 mg via ORAL
  Filled 2019-04-21 (×55): qty 2

## 2019-04-21 NOTE — Progress Notes (Signed)
Physical Therapy Treatment Patient Details Name: Rodney Collier MRN: [id number] DOB: November 16, 1965 Today's Date: 04/21/2019    History of Present Illness Pt is a 53 y.o. M with no known PMH who presents with right sided weakness, decreased sensation on right, diplopia and headache. CT showing remote L frontal infarcts, CTA head/neck with L P2 occlusion, MRI with acute L PCA infarct including much of L thalamus, L > R cerebellum, R pons. Now s/p complete revascularization of occluded L PCA with mechanical thrombectomy. ETT 8/27-8/28; self extubated 8/28.    PT Comments    Pt progressing towards physical therapy goals. Focus of session was PROM/AAROM on RLE/RUE and repositioning in the bed. Pt following commands with decreased processing time this session. Noted that pt likely with continued double vision, as pt closing on eye to focus on therapist at times. He demonstrated the ability [facility] on headboard and LLE pushing on bed. Will continue to follow and progress as able per POC.     Follow Up Recommendations  CIR;Supervision/Assistance - 24 hour     Equipment Recommendations  Other (comment)(TBD determined at next venue)    Recommendations for Other Services Rehab consult     Precautions / Restrictions Precautions Precautions: Fall Precaution Comments: R hemiparesis and neglect Restrictions Weight Bearing Restrictions: No    Mobility  Bed Mobility Overal bed mobility: Needs Assistance             General bed mobility comments: Focus of session was bed level activity and pt was able to assist with [facility] with LLE bent up and LUE pulling from headboard.   Transfers                    Ambulation/Gait                 Stairs             Wheelchair Mobility    Modified Rankin (Stroke Patients Only) Modified Rankin (Stroke Patients Only) Pre-Morbid Rankin Score: No symptoms Modified Rankin: Severe  disability     Balance Overall balance assessment: Needs assistance Sitting-balance support: Feet supported;Single extremity supported Sitting balance-Leahy Scale: Poor Sitting balance - Comments: min - mod A balance on EOB.   Standing balance support: Bilateral upper extremity supported Standing balance-Leahy Scale: Poor Standing balance comment: dependent on physical assist- working on weight shift while standing in stedy                            Cognition Arousal/Alertness: Awake/alert Behavior During Therapy: Flat affect Overall Cognitive Status: Impaired/Different from baseline Area of Impairment: Problem solving;Following commands;Safety/judgement;Orientation;Attention;Memory;Awareness                 Orientation Level: (Did not answer any orientation ??'s but responded to [name]) Current Attention Level: Sustained Memory: Decreased recall of precautions Following Commands: Follows one step commands with increased time;Follows one step commands consistently;Follows multi-step commands inconsistently Safety/Judgement: Decreased awareness of safety;Decreased awareness of deficits Awareness: Intellectual Problem Solving: Slow processing;Difficulty sequencing;Requires verbal cues;Requires tactile cues;Decreased initiation General Comments: pt says a few words this session but not alway intelligible. Unable to utilize R UE unless with hand over hand assist. Pt attending to R side more today but only intermittently      Exercises Hand Exercises Wrist Flexion: PROM;Right;10 reps Wrist Extension: Right;PROM;10 reps Composite Extension: Right;PROM;10 reps Low Level/ICU Exercises Ankle Circles/Pumps: PROM;Right;10  reps(Circles and DF/PF) Hip ABduction/ADduction: AAROM;Right;10 reps(Leg bent/foot planted. Pt initiating ADD but not ABD) Heel Slides: PROM;Right;10 reps Stabilized Bridging: Other (comment)(Attempted but pt only completed 1 rep) Shoulder Flexion:  Right;PROM;10 reps Elbow Flexion: Right;PROM;10 reps    General Comments        Pertinent Vitals/Pain Pain Assessment: Faces Pain Score: 0-No pain Faces Pain Scale: No hurt Pain Intervention(s): Monitored during session;Repositioned    Home Living                      Prior Function            PT Goals (current goals can now be found in the care plan section) Acute Rehab PT Goals Patient Stated Goal: unable PT Goal Formulation: Patient unable to participate in goal setting Time For Goal Achievement: 04/30/19 Potential to Achieve Goals: Fair Progress towards PT goals: Progressing toward goals    Frequency    Min 4X/week      PT Plan Current plan remains appropriate    Co-evaluation              AM-PAC PT "6 Clicks" Mobility   Outcome Measure  Help needed turning from your back to your side while in a flat bed without using bedrails?: A Lot Help needed moving from lying on your back to sitting on the side of a flat bed without using bedrails?: Total Help needed moving to and from a bed to a chair (including a wheelchair)?: Total Help needed standing up from a chair using your arms (e.g., wheelchair or bedside chair)?: A Lot Help needed to walk in hospital room?: Total Help needed climbing 3-5 steps with a railing? : Total 6 Click Score: 8    End of Session Equipment Utilized During Treatment: Gait belt Activity Tolerance: Patient limited by lethargy Patient left: in bed;with call bell/phone within reach;with bed alarm set;Other (comment) Nurse Communication: Mobility status PT Visit Diagnosis: Other symptoms and signs involving the nervous system (R29.898);Other abnormalities of gait and mobility (R26.89);Hemiplegia and hemiparesis Hemiplegia - Right/Left: Right Hemiplegia - dominant/non-dominant: Dominant Hemiplegia - caused by: Cerebral infarction     Time: 7035-0093 PT Time Calculation (min) (ACUTE ONLY): 18 min  Charges:  $Therapeutic  Exercise: 8-22 mins                     Rolinda Roan, PT, DPT Acute Rehabilitation Services Pager: [phone] Office: [phone]    Thelma Comp 04/21/2019, 1:25 PM

## 2019-04-21 NOTE — Progress Notes (Signed)
STROKE TEAM PROGRESS NOTE   INTERVAL HISTORY Patient continues to have low-grade fever.  Urine analysis suggest infection.  Urine cultures are pending.  Vital signs are stable.  Neurological exam is unchanged.  Vitals:   04/21/19 0042 04/21/19 0315 04/21/19 0904 04/21/19 1151  BP: (!) 165/99 (!) 147/101 (!) 175/94 (!) 169/94  Pulse: 90 80 80 97  Resp: 18 18 20 20   Temp: 98.5 F (36.9 C) 98.4 F (36.9 C) 99.5 F (37.5 C) 99.4 F (37.4 C)  TempSrc: Oral Oral Oral Oral  SpO2: 98% 100% 98% 100%  Weight:      Height:        CBC:  Recent Labs  Lab 04/15/19 0703 04/20/19 1619  WBC 10.1 7.7  NEUTROABS 7.0  --   HGB 12.3* 11.9*  HCT 40.6 38.4*  MCV 74.2* 72.7*  PLT 254 109    Basic Metabolic Panel:  Recent Labs  Lab 04/15/19 0703 04/16/19 0616 04/21/19 0425  NA 138 140 138  K 4.8 3.6 3.5  CL 109 106 102  CO2 17* 23 23  GLUCOSE 151* 114* 241*  BUN 10 8 5*  CREATININE 0.82 0.83 0.79  CALCIUM 8.9 8.8* 8.7*  MG 1.8  --   --   PHOS 2.6  --   --     IMAGING CT Head WO Contrast 04/16/2019 Areas of acute infarction are showing low-density and swelling but no macroscopic hemorrhage by CT. As shown by previous MRI, there is involvement of the cerebellum, left cerebral peduncle and thalamus and left posteromedial temporal lobe and occipital lobe. Tiny focus of infarction in the right occipital lobe as well.  [name] 04/15/2019 1. Acute infarction throughout the left PCA territory (including most of the left thalamus), left more than right cerebellum, and right pons. Petechial hemorrhage is seen at the level of the pons.  2. Prior left frontal infarcts and microvascular ischemic gliosis.   Cerebral angiogram 04/14/2019 4 vessel cerebral arteriogram followed by complete revascularization of occluded Lt PCA  With x 2 passes with 73mm x 51mm embotrap retriever  device achieving a TICI 3 revascularization.  Ct Code Stroke Cta Head W/wo Contrast Ct Code Stroke Cta  Neck W/wo Contrast Ct Code Stroke Cta Cerebral Perfusion W/wo Contrast 04/14/2019 1. Left P2 occlusion with downstream reconstitution. Qualitative assessment of CT perfusion maps shows ischemia without visible core infarct in the left occipital lobe. 2. Mild atherosclerosis in the neck without flow limiting stenosis or embolic source seen. 3. No large vessel occlusion in the anterior circulation. 4. Mild mid basilar narrowing.   Ct Head Code Stroke Wo Contrast 04/14/2019 1. No acute finding.  2. Small remote appearing infarcts in the left frontal lobe.    LE Doppler 04/15/2019 Right: There is no evidence of deep vein thrombosis in the lower extremity. No cystic structure found in the popliteal fossa. Left: There is no evidence of deep vein thrombosis in the lower extremity. No cystic structure found in the popliteal fossa.  2D Echocardiogram 04/15/2019  1. The left ventricle has normal systolic function, with an ejection fraction of 55-60%. The cavity size was normal. Left ventricular diastolic parameters were normal. No evidence of left ventricular regional wall motion abnormalities.  2. The right ventricle has normal systolic function. The cavity was normal. There is no increase in right ventricular wall thickness.  3. The mitral valve is abnormal. Mild thickening of the mitral valve leaflet. The MR jet is posteriorly-directed.  4. The tricuspid valve  is grossly normal.  5. The aortic valve is tricuspid. No stenosis of the aortic valve.  6. The aorta is normal unless otherwise noted.  7. The inferior vena cava was normal in size with <50% respiratory variability. SUMMARY LVEF 55-60%, normal wall thickness, normal wall motion, normal diastolic function, mild posteriorly directed MR, normal size IVC that does not collapse, no PFO by color doppler  FINDINGS  Left Ventricle: The left ventricle has normal systolic function, with an ejection fraction of 55-60%. The cavity size was normal. There  is no increase in left ventricular wall thickness. Left ventricular diastolic parameters were normal. Normal left ventricular filling pressures No evidence of left ventricular regional wall motion abnormalities..  EEG  04/15/2019 This study is suggestive of cortical dysfunction in the left hemisphere secondary to underlying stroke as well as mild diffuse encephalopathy. No seizures or definite epileptiform discharges were seen throughout the recording.  TCD w/ bubble 04/18/2019  No HTIS heard during rest. No HITS heard during valsalva.  TEE 04/19/2019 LVEF 55-60% No LA/LAA thrombus or mass No ASD or PFO by color flow Doppler or saline microcavitation study. Mild AR, mild MR.   PHYSICAL EXAM      General - Well nourished, well developed, African gentleman who is not in distress Ophthalmologic - fundi not visualized due to noncooperation. Lungs clear to auscultation Cardiovascular - Regular rate and rhythm. Distal pulses are well felt  Neurological Exam-awake, eyes open, following most of the simple commands. Eyes mid position, able to have left gaze with right eye nystagmus and can cross the midline slightly. PERRL, blinking on the left not on the right. Right facial droop, tongue protrusion not cooperative, against gravity of LUE but significant ataxia, able to follow simple commands with left hand. lifts left leg slightly off of the bed on command, follow simple commands of the left foot. On pain stimulation, slight withdraw of RLE but no movement of RUE or RLE. DTR 1+ and no babinski. Sensation and gait not tested.   ASSESSMENT/PLAN [name] with no significant past medical history presenting with right-sided weakness, decreased sensation on the right, diplopia and headache. Complete revascularization of occluded L PCA w/ mechanical thrombectomy.   Stroke: Multiple posterior circulation infarcts-bilateral  Cerebellar,Right Pons,embolic infarcts and most  significant in the left P2 territory with P2 occlusion s/p IR with TICI3 reperfusion - embolic secondary midbasilar stenosis  Code Stroke CT head No acute abnormality. Old infarct L frontal lobe.   CTA head & neck L P2 occlusion. Mild neck atherosclerosis . No LVO anterior circulation. Mild mid BA narrowing.  CT perfusion ischemia w/o visible core  CT Head WO Contrast - 04/16/19 - Areas of acute infarction are showing low-density and swelling but no macroscopic hemorrhage by CT.  As shown by previous MRI, there is involvement of the cerebellum, left cerebral peduncle and thalamus and left posteromedial temporal lobe and occipital lobe. Tiny focus of infarction in the right occipital lobe as well.  Cerebral angio TICI reperfusion of occluded L PCA  Post IR CT brain no ICH.  MRI  L PCA (including thalamus), L>R cerebellum, R pontine and left midbrain infarct. Petechial hemorrhage seen in pons. Old L frontal infarcts. Small vessel disease.   LE Doppler no DVT - no evidence of deep vein thrombosis   2D Echo - EF 55-60%.  No cardiac source of emboli identified.   TCD bubble no HITS  TEE neg  UDS - pos for benzos -  UDS done on 9/1  EEG - suggestive of cortical dysfunction in the left hemisphere secondary to underlying stroke as well as mild diffuse encephalopathy. No seizures or definite epileptiform discharges were seen throughout the recording.  LDL 88  HgbA1c 12.3  Lovenox 40 mg sq daily  for VTE prophylaxis  No antithrombotic prior to admission, now on aspirin 300 mg suppository daily. Now on aspirin 81 mg and plavix 75 mg daily x 3 weeks, then aspirin alone.   Therapy recommendations:  SNF and CIR - CIR consult in place  Disposition:  pending   Possible seizure  Full body rigid shaking hours post IR, L>R  Loaded with keppra  EEG - suggestive of cortical dysfunction in the left hemisphere secondary to underlying stroke as well as mild diffuse encephalopathy. No seizures or  definite epileptiform discharges were seen throughout the recording.  Continue keppra 500mg  bid  Hypertension  No hx of HTN, on no home meds  Placed On cleviprex gtt for BP control post IR, now off . On amlodipine 5  . SBP 160-170s . Long-term BP goal normotensive  Hyperlipidemia  Home meds:  No statin  LDL 88, goal < 70  Add lipitor 20  Continue statin at discharge  Diabetes type II, new diagnosis, Uncontrolled  HgbA1c 12.3, goal < 7.0  CBGs  SSI - increase correction scale per DB RN consult  Hyperglycemia, improved   Likely hypercoagulable condition associated with uncontrolled DM  Dysphagia . Secondary to stroke . Speech on board . cleared for Dysphagia 3 nectar thick liquids with MBS   Other Stroke Risk Factors   Small remote appearing infarcts in the left frontal lobe by CT and MRI  Other Active Problems  Acute Respiratory Failure requiring intubation. Pt self extubated 04/15/19 - CCM on board  UTI > 50 RBC and WBC, WBC present, lg LE. Started on rocephin 1 gm q 24h on 9/3 x 5 days  Hospital day # 7 Recommends start ceftriaxone IV   for total duration of 5 days.  Continue ongoing therapies.  Await transfer to rehab when bed available available over the next few days.  Discussed with care team and answered questions.  Greater than 50% time during this 25-minute visit was spent on counseling and coordination of care and discussion with care team  Delia HeadyPramod Tiawana Forgy, MD Medical Director [facility] Pager: [phone] 04/21/2019 1:32 PM   To contact Stroke Continuity provider, please refer to [email or web address]. After hours, contact General Neurology

## 2019-04-21 NOTE — Progress Notes (Signed)
  Speech Language Pathology Treatment: Cognitive-Linquistic(Aphasia)  Patient Details Name: Rodney Collier MRN: [id number] DOB: 04-05-1966 Today's Date: 04/21/2019 Time: 8502-7741 SLP Time Calculation (min) (ACUTE ONLY): 19 min  Assessment / Plan / Recommendation Clinical Impression  Pt was seen for aphasia treatment and was cooperative throughout the session. However, he reported fatigue and the session was ultimately abbreviated for this reason. Speech intelligibility remains significantly reduced due to reduced vocal intensity and impaired articulatory precision. With max cues he was able to increase vocal intensity which slightly improved speech intelligibility. He reported that he has been dysphonic since December, 2019 but, considering his difficulty with retention of precautions during the session, his reliability as a historian is questioned. He demonstrated 20% accuracy with confrontational naming of objects increasing to 40% accuracy with phonemic cues. He answered simple yes/no questions with 100% accuracy but required cues to follow 1-step commands. He produced 4/7 days of the week with mod cues from. Phrase and sentence formulation was noted during the session but the meaning could not be deciphered due to reduced speech intelligibility. SLP will continue to follow pt.    HPI HPI: Pt is a 53 y.o. M with no known PMH who presents with right sided weakness, decreased sensation on right, diplopia and headache. CT showing remote L frontal infarcts, CTA head/neck with L P2 occlusion, MRI with acute L PCA infarct including much of L thalamus, L > R cerebellum, R pons. Now s/p complete revascularization of occluded L PCA with mechanical thrombectomy. ETT 8/27-8/28; self extubated 8/28.      SLP Plan  Continue with current plan of care       Recommendations  Medication Administration: Via alternative means                Oral Care Recommendations: Oral care QID Follow up  Recommendations: Inpatient Rehab;24 hour supervision/assistance SLP Visit Diagnosis: Aphasia (R47.01);Dysarthria and anarthria (R47.1) Plan: Continue with current plan of care       Lenee Franze I. Hardin Negus, [place], [place] Office number [phone] Pager [place] 04/21/2019, 11:05 AM

## 2019-04-21 NOTE — Progress Notes (Signed)
Inpatient Diabetes Program Recommendations  AACE/ADA: New Consensus Statement on Inpatient Glycemic Control (2015)  Target Ranges:  Prepandial:   less than 140 mg/dL      Peak postprandial:   less than 180 mg/dL (1-2 hours)      Critically ill patients:  140 - 180 mg/dL   Lab Results  Component Value Date   GLUCAP 212 (H) 04/21/2019   HGBA1C 12.3 (H) 04/15/2019    Review of Glycemic Control  Diabetes history: DM 2 Outpatient Diabetes medications: none listed Current orders for Inpatient glycemic control:  Novolog 0-9 units tid Novolog 0-5 units qhs  A1c 12.3%  Inpatient Diabetes Program Recommendations:    Glucose trends above inpatient goal. Renal function good. Consider increasing Novolog Correction scale to 0-15 units tid.    Pt not appropriate for education at this time. Based on A1c pt may need insulin at d/c however, glucose trends covered by correction scale only. Also note patient on supplements, which may be increasing trends. Will follow trends to see if more PO intake will increase insulin needs.   Thanks,  Tama Headings RN, MSN, BC-ADM Inpatient Diabetes Coordinator Team Pager [phone] (8a-5p)

## 2019-04-21 NOTE — Progress Notes (Addendum)
  Speech Language Pathology Treatment: Dysphagia  Patient Details Name: Rodney Collier MRN: [id number] DOB: [birth year] Today's Date: 04/21/2019 Time: 0930-1000 SLP Time Calculation (min) (ACUTE ONLY): 30 min  Assessment / Plan / Recommendation Clinical Impression  Pt was seen for dysphagia treatment in this session.  Pt was encountered awake/alert sitting upright in bed in the presence of his breakfast meal tray and baseline cough was observed.  SLP assisted with set up for breakfast.  Pt consumed trials of Dysphagia 3 (mech soft) solids, nectar-thick liquids, and honey thick liquids.  Pt with noted impulsivity during po consumption including taking multiple large bites and sips in a row despite verbal and tactile cues.  Pt was additionally observed to have right anterior labial spillage and prolonged mastication with oral pocketing in his R buccal cavity.  Pt required frequent verbal and tactile cues to use lingual sweep and to masticate solids on the L side. Pt exhibited delayed coughing and throat clearing with solids and mixed consistencies (D3 solids and nectar-thick liquid as liquid wash).  Pt did not exhibit clinical s/sx of aspiration with nectar-thick liquid when consumed alone.  Recommend diet change to Dysphagia 2 (ground) solids with continuation of nectar thick liquid. Reviewed recommendations with patient; however, he would benefit from additional education secondary to language deficits.    HPI HPI: Pt is a 53 y.o. M with no known PMH who presents with right sided weakness, decreased sensation on right, diplopia and headache. CT showing remote L frontal infarcts, CTA head/neck with L P2 occlusion, MRI with acute L PCA infarct including much of L thalamus, L > R cerebellum, R pons. Now s/p complete revascularization of occluded L PCA with mechanical thrombectomy. ETT 8/27-8/28; self extubated 8/28.      SLP Plan  Continue with current plan of care       Recommendations  Diet  recommendations: Dysphagia 2 (fine chop);Nectar-thick liquid Liquids provided via: Cup Medication Administration: Crushed with puree                Oral Care Recommendations: Oral care BID Follow up Recommendations: Inpatient Rehab;24 hour supervision/assistance SLP Visit Diagnosis: Dysphagia, unspecified (R13.10) Plan: Continue with current plan of care       [place] 04/21/2019, 11:14 AM

## 2019-04-22 LAB — GLUCOSE, CAPILLARY
Glucose-Capillary: 242 mg/dL — ABNORMAL HIGH (ref 70–99)
Glucose-Capillary: 245 mg/dL — ABNORMAL HIGH (ref 70–99)
Glucose-Capillary: 259 mg/dL — ABNORMAL HIGH (ref 70–99)
Glucose-Capillary: 252 mg/dL — ABNORMAL HIGH (ref 70–99)
Glucose-Capillary: 394 mg/dL — ABNORMAL HIGH (ref 70–99)

## 2019-04-22 LAB — URINE CULTURE: Culture: 40000 — AB

## 2019-04-22 MED ORDER — CIPROFLOXACIN HCL 500 MG PO TABS
500.0000 mg | ORAL_TABLET | Freq: Two times a day (BID) | ORAL | Status: DC
  Administered 2019-04-22 – 2019-04-26 (×9): 500 mg via ORAL
  Filled 2019-04-22 (×8): qty 1

## 2019-04-22 NOTE — Progress Notes (Signed)
  Speech Language Pathology Treatment: Cognitive-Linquistic;Dysphagia  Patient Details Name: Rodney Collier MRN: [id number] DOB: [birth year] Today's Date: 04/22/2019 Time: 1191-4782 SLP Time Calculation (min) (ACUTE ONLY): 30 min  Assessment / Plan / Recommendation Clinical Impression  Pt demonstrated increased difficulty in verbal expression and motor speech ability compared to previous documentation . Whispered vocalization quality today and unable to achieve vocal cord adduction despite support of facilitation of vocal cord adduction through vocalization with resistance (pushing against SLP's hand) or automaticity with familiar songs. Dysarthria present marked by reduced vocal intensity and respiratory support.Suspect periods of vocal and verval apraxia. Phonemic paraphasias during and neologisms. He could repeat/whisper single words and state his name with phonemic errors and distortions. Visual cues of therapists lips provided but he could not imitate articulatory placement. Visual cues with months of the year did not increase accuracy. He nodded/shook head and used gestures to respond to simple questions.    Therapist removed mild-mod amount of pocketed eggs from right side oral cavity. Consumed large volume during self feeding with nectar thick juice with mod assist for subsequent sips. Masticated soft peach from fruit cup with juice drained with delayed throat clears possibly from juice within the peach. Posted separate sign for staff and left additional toothettes on counter to remove pocketed food from right buccal cavity. Recommend continue nectar thick liquids and Dys 2, check for pocketing, full assist and crush pills.     HPI HPI: Pt is a 53 y.o. M with no known PMH who presents with right sided weakness, decreased sensation on right, diplopia and headache. CT showing remote L frontal infarcts, CTA head/neck with L P2 occlusion, MRI with acute L PCA infarct including much of L thalamus, L > R  cerebellum, R pons. Now s/p complete revascularization of occluded L PCA with mechanical thrombectomy. ETT 8/27-8/28; self extubated 8/28.      SLP Plan  Continue with current plan of care       Recommendations  Diet recommendations: Dysphagia 2 (fine chop);Nectar-thick liquid Liquids provided via: Cup Medication Administration: Crushed with puree Supervision: Staff to assist with self feeding;Full supervision/cueing for compensatory strategies Compensations: Minimize environmental distractions;Small sips/bites;Slow rate;Lingual sweep for clearance of pocketing Postural Changes and/or Swallow Maneuvers: Seated upright 90 degrees                Oral Care Recommendations: Oral care BID Follow up Recommendations: Skilled Nursing facility SLP Visit Diagnosis: Dysphagia, oropharyngeal phase (R13.12);Cognitive communication deficit (N56.213) Plan: Continue with current plan of care       GO                [place] Siren 04/22/2019, 11:39 AM   [name].Ed Risk analyst [phone] Office [phone]

## 2019-04-22 NOTE — Progress Notes (Signed)
Inpatient Rehab Admissions Coordinator:   Spoke with pt who indicated that he did not have any insurance (including [place] medicaid).  I called daughter to discuss costs of rehab when pt's don't have insurance.  She would like to discuss with her family over the weekend.  She also has questions for physician regarding pt current status and whether he is fit to travel.  Please advise.  We will follow up on Monday.   Shann Medal, PT, DPT Admissions Coordinator [phone] 04/22/19  12:09 PM

## 2019-04-22 NOTE — Progress Notes (Signed)
Inpatient Diabetes Program Recommendations  AACE/ADA: New Consensus Statement on Inpatient Glycemic Control (2015)  Target Ranges:  Prepandial:   less than 140 mg/dL      Peak postprandial:   less than 180 mg/dL (1-2 hours)      Critically ill patients:  140 - 180 mg/dL   Lab Results  Component Value Date   GLUCAP 259 (H) 04/22/2019   HGBA1C 12.3 (H) 04/15/2019    Review of Glycemic Control Results for [facility] (MRN [id number]) as of 04/22/2019 14:34  Ref. Range 04/21/2019 21:39 04/22/2019 06:16 04/22/2019 11:31  Glucose-Capillary Latest Ref Range: 70 - 99 mg/dL 242 (H) 245 (H) 259 (H)  Diabetes history: DM 2 Outpatient Diabetes medications: none listed Current orders for Inpatient glycemic control:  Novolog 0-15 units tid Novolog 0-5 units qhs  Inpatient Diabetes Program Recommendations:    CBG's> goal. Consider restarting Levemir 5 units bid.   Will need continued education regarding DM in rehab and family will also need to be included.   Thanks,  Adah Perl, RN, BC-ADM Inpatient Diabetes Coordinator Pager [phone] (8a-5p)

## 2019-04-22 NOTE — Progress Notes (Signed)
Occupational Therapy Treatment Patient Details Name: Rodney Collier MRN: [id number] DOB: 1965-10-26 Today's Date: 04/22/2019    History of present illness Pt is a 53 y.o. M with no known PMH who presents with right sided weakness, decreased sensation on right, diplopia and headache. CT showing remote L frontal infarcts, CTA head/neck with L P2 occlusion, MRI with acute L PCA infarct including much of L thalamus, L > R cerebellum, R pons. Now s/p complete revascularization of occluded L PCA with mechanical thrombectomy. ETT 8/27-8/28; self extubated 8/28.   OT comments  Pt progressing. Pt unable to hold marker successfully for writing in L hand. Pt appears to be R handed prior. pt very difficult to keep attending to task as his vision is so poor and pt is aphasic. Pt following 1 step commands with heavy moderate cueing. Pt performing visual scanning with decreased mobility L eye past midline and R side inattention. Pt performing HEP, PROM x5 reps with hand over hand with LUE to attend to R side. Pt seated in chair for task. Pt unable to consistently point to different color papers when asked with choices. Pt tolerating session fair. OT to continue to follow.   Follow Up Recommendations  CIR(SNF for long term if family not available)    Equipment Recommendations  Other (comment)(to be determined)    Recommendations for Other Services      Precautions / Restrictions Precautions Precautions: Fall Precaution Comments: R hemiparesis and neglect Restrictions Weight Bearing Restrictions: No       Mobility Bed Mobility Overal bed mobility: Needs Assistance Bed Mobility: Supine to Sit     Supine to sit: Max assist;+2 for physical assistance     General bed mobility comments: max A for LE management off EOB. Assist to elevate trunk and progress hips forward.  Transfers Overall transfer level: Needs assistance Equipment used: (2 person lift with gait belt) Transfers: Sit to/from  Stand Sit to Stand: Min assist;Mod assist;+2 physical assistance         General transfer comment: initially min A to stand with use of steady. Performed sit<>stand 4 times for neuromuscular re-ed. As pt fatigued, he required increase assist to rise. Assist given for balance and postrual control when transfered in stedy to recliner chair.    Balance Overall balance assessment: Needs assistance Sitting-balance support: Feet supported;Single extremity supported Sitting balance-Leahy Scale: Poor Sitting balance - Comments: min - mod A balance on EOB. Postural control: Right lateral lean;Posterior lean Standing balance support: Bilateral upper extremity supported Standing balance-Leahy Scale: Poor Standing balance comment: dependent on physical assist- working on weight shift while standing in stedy                           ADL either performed or assessed with clinical judgement   ADL Overall ADL's : Needs assistance/impaired                                       General ADL Comments: Pt unable to hold marker successfully for writing in L hand. Pt appears to be R handed prior. pt very difficult to leep attending to task as his vision is so poor and pt is aphasic.     Vision   Vision Assessment?: Vision impaired- to be further tested in functional context Additional Comments: pt continues to cover L eye throughout session. Pt nods head yes  to everything so unsure of diploia persistance, but assume it is present   Perception     Praxis      Cognition Arousal/Alertness: Awake/alert Behavior During Therapy: Flat affect Overall Cognitive Status: Impaired/Different from baseline Area of Impairment: Problem solving;Following commands;Safety/judgement;Attention;Memory;Awareness                   Current Attention Level: Sustained Memory: Decreased recall of precautions Following Commands: Follows one step commands with increased time;Follows one  step commands consistently;Follows multi-step commands inconsistently Safety/Judgement: Decreased awareness of safety;Decreased awareness of deficits Awareness: Intellectual Problem Solving: Slow processing;Difficulty sequencing;Requires verbal cues;Requires tactile cues;Decreased initiation General Comments: Pt able to whisper responses to questions. Neglects R side. Impulsive when he saw the stedy. Attempting to reach for grab bar though it was too far away.        Exercises Exercises: General Upper Extremity General Exercises - Upper Extremity Shoulder Flexion: PROM;5 reps;Seated Shoulder ABduction: PROM;5 reps;Right;Seated Elbow Flexion: PROM;5 reps;Right;Seated Hand Exercises Wrist Flexion: PROM;Right;10 reps Wrist Extension: Right;PROM;10 reps Composite Extension: Right;PROM;10 reps Low Level/ICU Exercises Ankle Circles/Pumps: PROM;Right;10 reps   Shoulder Instructions       General Comments      Pertinent Vitals/ Pain       Pain Assessment: No/denies pain  Home Living                                          Prior Functioning/Environment              Frequency  Min 2X/week        Progress Toward Goals  OT Goals(current goals can now be found in the care plan section)  Progress towards OT goals: Progressing toward goals  Acute Rehab OT Goals Patient Stated Goal: unable OT Goal Formulation: Patient unable to participate in goal setting Time For Goal Achievement: 05/04/19 Potential to Achieve Goals: Good ADL Goals Pt Will Perform Grooming: with min assist;sitting Pt Will Transfer to Toilet: with max assist;bedside commode;stand pivot transfer Additional ADL Goal #1: pt will complete bed mobility mod (A) as precursor to adls Additional ADL Goal #2: pt will complete 2 step command  Plan Discharge plan needs to be updated    Co-evaluation                 AM-PAC OT "6 Clicks" Daily Activity     Outcome Measure   Help from  another person eating meals?: A Lot Help from another person taking care of personal grooming?: Total Help from another person toileting, which includes using toliet, bedpan, or urinal?: Total Help from another person bathing (including washing, rinsing, drying)?: Total Help from another person to put on and taking off regular upper body clothing?: Total Help from another person to put on and taking off regular lower body clothing?: Total 6 Click Score: 7    End of Session    OT Visit Diagnosis: Unsteadiness on feet (R26.81);Hemiplegia and hemiparesis Hemiplegia - Right/Left: Right Hemiplegia - dominant/non-dominant: Dominant Hemiplegia - caused by: Cerebral infarction   Activity Tolerance Patient tolerated treatment well   Patient Left in chair;with chair alarm set;with call bell/phone within reach   Nurse Communication Mobility status        Time: 4235-3614 OT Time Calculation (min): 19 min  Charges: OT General Charges $OT Visit: 1 Visit OT Treatments $Neuromuscular Re-education: 8-22 mins  Revonda Standard Cecil Cranker) Glendell Docker OTR/L Acute  Rehabilitation Services Pager: [phone] Office: [phone]    Rodney Collier 04/22/2019, 4:09 PM

## 2019-04-22 NOTE — Progress Notes (Signed)
STROKE TEAM PROGRESS NOTE   INTERVAL HISTORY Patient continues to have low-grade fever though improved.   urine cultures are back and suggest staph.e vital signs are stable.  Neurological exam is unchanged.  Vitals:   04/22/19 0327 04/22/19 0811 04/22/19 1129 04/22/19 1504  BP: (!) 154/98 (!) 150/92 (!) 158/94 (!) 157/98  Pulse: 93 87 85 77  Resp: 18 20 15 20   Temp: 99.2 F (37.3 C) 99 F (37.2 C) 98.7 F (37.1 C) 98.7 F (37.1 C)  TempSrc: Axillary Oral Oral Oral  SpO2: 100% 100% 100% 98%  Weight:      Height:        CBC:  Recent Labs  Lab 04/20/19 1619  WBC 7.7  HGB 11.9*  HCT 38.4*  MCV 72.7*  PLT 275    Basic Metabolic Panel:  Recent Labs  Lab 04/16/19 0616 04/21/19 0425  NA 140 138  K 3.6 3.5  CL 106 102  CO2 23 23  GLUCOSE 114* 241*  BUN 8 5*  CREATININE 0.83 0.79  CALCIUM 8.8* 8.7*    IMAGING CT Head WO Contrast 04/16/2019 Areas of acute infarction are showing low-density and swelling but no macroscopic hemorrhage by CT. As shown by previous MRI, there is involvement of the cerebellum, left cerebral peduncle and thalamus and left posteromedial temporal lobe and occipital lobe. Tiny focus of infarction in the right occipital lobe as well.  [name] 04/15/2019 1. Acute infarction throughout the left PCA territory (including most of the left thalamus), left more than right cerebellum, and right pons. Petechial hemorrhage is seen at the level of the pons.  2. Prior left frontal infarcts and microvascular ischemic gliosis.   Cerebral angiogram 04/14/2019 4 vessel cerebral arteriogram followed by complete revascularization of occluded Lt PCA  With x 2 passes with 78mm x 25mm embotrap retriever  device achieving a TICI 3 revascularization.  Ct Code Stroke Cta Head W/wo Contrast Ct Code Stroke Cta Neck W/wo Contrast Ct Code Stroke Cta Cerebral Perfusion W/wo Contrast 04/14/2019 1. Left P2 occlusion with downstream reconstitution. Qualitative  assessment of CT perfusion maps shows ischemia without visible core infarct in the left occipital lobe. 2. Mild atherosclerosis in the neck without flow limiting stenosis or embolic source seen. 3. No large vessel occlusion in the anterior circulation. 4. Mild mid basilar narrowing.   Ct Head Code Stroke Wo Contrast 04/14/2019 1. No acute finding.  2. Small remote appearing infarcts in the left frontal lobe.    LE Doppler 04/15/2019 Right: There is no evidence of deep vein thrombosis in the lower extremity. No cystic structure found in the popliteal fossa. Left: There is no evidence of deep vein thrombosis in the lower extremity. No cystic structure found in the popliteal fossa.  2D Echocardiogram 04/15/2019  1. The left ventricle has normal systolic function, with an ejection fraction of 55-60%. The cavity size was normal. Left ventricular diastolic parameters were normal. No evidence of left ventricular regional wall motion abnormalities.  2. The right ventricle has normal systolic function. The cavity was normal. There is no increase in right ventricular wall thickness.  3. The mitral valve is abnormal. Mild thickening of the mitral valve leaflet. The MR jet is posteriorly-directed.  4. The tricuspid valve is grossly normal.  5. The aortic valve is tricuspid. No stenosis of the aortic valve.  6. The aorta is normal unless otherwise noted.  7. The inferior vena cava was normal in size with <50% respiratory variability. SUMMARY LVEF  55-60%, normal wall thickness, normal wall motion, normal diastolic function, mild posteriorly directed MR, normal size IVC that does not collapse, no PFO by color doppler  FINDINGS  Left Ventricle: The left ventricle has normal systolic function, with an ejection fraction of 55-60%. The cavity size was normal. There is no increase in left ventricular wall thickness. Left ventricular diastolic parameters were normal. Normal left ventricular filling pressures No  evidence of left ventricular regional wall motion abnormalities..  EEG  04/15/2019 This study is suggestive of cortical dysfunction in the left hemisphere secondary to underlying stroke as well as mild diffuse encephalopathy. No seizures or definite epileptiform discharges were seen throughout the recording.  TCD w/ bubble 04/18/2019  No HTIS heard during rest. No HITS heard during valsalva.  TEE 04/19/2019 LVEF 55-60% No LA/LAA thrombus or mass No ASD or PFO by color flow Doppler or saline microcavitation study. Mild AR, mild MR.   PHYSICAL EXAM      General - Well nourished, well developed, African gentleman who is not in distress Ophthalmologic - fundi not visualized due to noncooperation. Lungs clear to auscultation Cardiovascular - Regular rate and rhythm. Distal pulses are well felt  Neurological Exam-awake, eyes open, following most of the simple commands. Eyes mid position, able to have left gaze with right eye nystagmus and can cross the midline slightly. PERRL, blinking on the left not on the right. Right facial droop, tongue protrusion not cooperative, against gravity of LUE but significant ataxia, able to follow simple commands with left hand. lifts left leg slightly off of the bed on command, follow simple commands of the left foot. On pain stimulation, slight withdraw of RLE but no movement of RUE or RLE. DTR 1+ and no babinski. Sensation and gait not tested.   ASSESSMENT/PLAN [name] with no significant past medical history presenting with right-sided weakness, decreased sensation on the right, diplopia and headache. Complete revascularization of occluded L PCA w/ mechanical thrombectomy.   Stroke: Multiple posterior circulation infarcts-bilateral  Cerebellar,Right Pons,embolic infarcts and most significant in the left P2 territory with P2 occlusion s/p IR with TICI3 reperfusion - embolic secondary midbasilar stenosis  Code Stroke CT head No  acute abnormality. Old infarct L frontal lobe.   CTA head & neck L P2 occlusion. Mild neck atherosclerosis . No LVO anterior circulation. Mild mid BA narrowing.  CT perfusion ischemia w/o visible core  CT Head WO Contrast - 04/16/19 - Areas of acute infarction are showing low-density and swelling but no macroscopic hemorrhage by CT.  As shown by previous MRI, there is involvement of the cerebellum, left cerebral peduncle and thalamus and left posteromedial temporal lobe and occipital lobe. Tiny focus of infarction in the right occipital lobe as well.  Cerebral angio TICI reperfusion of occluded L PCA  Post IR CT brain no ICH.  MRI  L PCA (including thalamus), L>R cerebellum, R pontine and left midbrain infarct. Petechial hemorrhage seen in pons. Old L frontal infarcts. Small vessel disease.   LE Doppler no DVT - no evidence of deep vein thrombosis   2D Echo - EF 55-60%.  No cardiac source of emboli identified.   TCD bubble no HITS  TEE neg  UDS - pos for benzos - UDS done on 9/1  EEG - suggestive of cortical dysfunction in the left hemisphere secondary to underlying stroke as well as mild diffuse encephalopathy. No seizures or definite epileptiform discharges were seen throughout the recording.  LDL 88  HgbA1c 12.3  Lovenox 40 mg sq daily  for VTE prophylaxis  No antithrombotic prior to admission, now on aspirin 300 mg suppository daily. Now on aspirin 81 mg and plavix 75 mg daily x 3 weeks, then aspirin alone.   Therapy recommendations:  SNF and CIR - CIR consult in place  Disposition:  pending   Possible seizure  Full body rigid shaking hours post IR, L>R  Loaded with keppra  EEG - suggestive of cortical dysfunction in the left hemisphere secondary to underlying stroke as well as mild diffuse encephalopathy. No seizures or definite epileptiform discharges were seen throughout the recording.  Continue keppra 500mg  bid  Hypertension  No hx of HTN, on no home  meds  Placed On cleviprex gtt for BP control post IR, now off . On amlodipine 5  . SBP 160-170s . Long-term BP goal normotensive  Hyperlipidemia  Home meds:  No statin  LDL 88, goal < 70  Add lipitor 20  Continue statin at discharge  Diabetes type II, new diagnosis, Uncontrolled  HgbA1c 12.3, goal < 7.0  CBGs  SSI - increase correction scale per DB RN consult  Hyperglycemia, improved   Likely hypercoagulable condition associated with uncontrolled DM  Dysphagia . Secondary to stroke . Speech on board . cleared for Dysphagia 3 nectar thick liquids with MBS   Other Stroke Risk Factors   Small remote appearing infarcts in the left frontal lobe by CT and MRI  Other Active Problems  Acute Respiratory Failure requiring intubation. Pt self extubated 04/15/19 - CCM on board  UTI > 50 RBC and WBC, WBC present, lg LE. Started on rocephin 1 gm q 24h on 9/3 and changed to Cipro 500 twice daily on 04/22/2019  Hospital day # 8   ceftriaxone changed to cipro for total duration of 5 days.  Continue ongoing therapies.  Await transfer to rehab when bed available available over the next few days.  Antony Contras, MD Medical Director [facility] Pager: [phone] 04/22/2019 5:34 PM   To contact Stroke Continuity provider, please refer to [email or web address] After hours, contact General Neurology

## 2019-04-22 NOTE — Progress Notes (Signed)
Physical Therapy Treatment Patient Details Name: Rodney SinclairSamuel Collier MRN: [id number] DOB: [birth year] Today's Date: 04/22/2019    History of Present Illness Pt is a 53 y.o. M with no known PMH who presents with right sided weakness, decreased sensation on right, diplopia and headache. CT showing remote L frontal infarcts, CTA head/neck with L P2 occlusion, MRI with acute L PCA infarct including much of L thalamus, L > R cerebellum, R pons. Now s/p complete revascularization of occluded L PCA with mechanical thrombectomy. ETT 8/27-8/28; self extubated 8/28.    PT Comments    On arrival to room pt, bed, and gown covered in food and beverage. Pt required total assist to don new gown and for cleaning. He was able to progress to EOB with max A +2 and into recliner chair with use of the stedy. Current plan remains appropriate. Will continue to follow.     Follow Up Recommendations  CIR;Supervision/Assistance - 24 hour     Equipment Recommendations  Other (comment)(TBD determined at next venue)    Recommendations for Other Services Rehab consult     Precautions / Restrictions Precautions Precautions: Fall Precaution Comments: R hemiparesis and neglect Restrictions Weight Bearing Restrictions: No    Mobility  Bed Mobility Overal bed mobility: Needs Assistance Bed Mobility: Supine to Sit     Supine to sit: Max assist;+2 for physical assistance     General bed mobility comments: max A for LE management off EOB. Assist to elevate trunk and progress hips forward.  Transfers Overall transfer level: Needs assistance Equipment used: (2 person lift with gait belt) Transfers: Sit to/from Stand Sit to Stand: Min assist;Mod assist;+2 physical assistance         General transfer comment: initially min A to stand with use of steady. Performed sit<>stand 4 times for neuromuscular re-ed. As pt fatigued, he required increase assist to rise. Assist given for balance and postrual control when  transfered in stedy to recliner chair.  Ambulation/Gait                 Stairs             Wheelchair Mobility    Modified Rankin (Stroke Patients Only) Modified Rankin (Stroke Patients Only) Pre-Morbid Rankin Score: No symptoms Modified Rankin: Severe disability     Balance Overall balance assessment: Needs assistance Sitting-balance support: Feet supported;Single extremity supported Sitting balance-Leahy Scale: Poor Sitting balance - Comments: min - mod A balance on EOB. Postural control: Right lateral lean;Posterior lean Standing balance support: Bilateral upper extremity supported Standing balance-Leahy Scale: Poor Standing balance comment: dependent on physical assist- working on weight shift while standing in stedy                            Cognition Arousal/Alertness: Awake/alert Behavior During Therapy: Flat affect Overall Cognitive Status: Impaired/Different from baseline Area of Impairment: Problem solving;Following commands;Safety/judgement;Attention;Memory;Awareness                   Current Attention Level: Sustained Memory: Decreased recall of precautions Following Commands: Follows one step commands with increased time;Follows one step commands consistently;Follows multi-step commands inconsistently Safety/Judgement: Decreased awareness of safety;Decreased awareness of deficits Awareness: Intellectual Problem Solving: Slow processing;Difficulty sequencing;Requires verbal cues;Requires tactile cues;Decreased initiation General Comments: Pt able to whisper responses to questions. Neglects R side. Impulsive when he saw the stedy. Attempting to reach for grab bar though it was too far away.      Exercises  General Comments        Pertinent Vitals/Pain Pain Assessment: No/denies pain    Home Living                      Prior Function            PT Goals (current goals can now be found in the care plan  section) Acute Rehab PT Goals Patient Stated Goal: unable PT Goal Formulation: Patient unable to participate in goal setting Time For Goal Achievement: 04/30/19 Potential to Achieve Goals: Fair Progress towards PT goals: Progressing toward goals    Frequency    Min 4X/week      PT Plan Current plan remains appropriate    Co-evaluation              AM-PAC PT "6 Clicks" Mobility   Outcome Measure  Help needed turning from your back to your side while in a flat bed without using bedrails?: A Lot Help needed moving from lying on your back to sitting on the side of a flat bed without using bedrails?: Total Help needed moving to and from a bed to a chair (including a wheelchair)?: Total Help needed standing up from a chair using your arms (e.g., wheelchair or bedside chair)?: A Little Help needed to walk in hospital room?: Total Help needed climbing 3-5 steps with a railing? : Total 6 Click Score: 9    End of Session Equipment Utilized During Treatment: Gait belt Activity Tolerance: Patient tolerated treatment well;Patient limited by lethargy Patient left: with call bell/phone within reach;in chair;with chair alarm set Nurse Communication: Mobility status;Need for lift equipment PT Visit Diagnosis: Other symptoms and signs involving the nervous system (R29.898);Other abnormalities of gait and mobility (R26.89);Hemiplegia and hemiparesis Hemiplegia - Right/Left: Right Hemiplegia - dominant/non-dominant: Dominant Hemiplegia - caused by: Cerebral infarction     Time: 1429-1450 PT Time Calculation (min) (ACUTE ONLY): 21 min  Charges:  $Therapeutic Activity: 8-22 mins                     Benjiman Collier, [place] Pager [phone] Acute Rehab   Rodney Collier 04/22/2019, 3:18 PM

## 2019-04-23 DIAGNOSIS — R471 Dysarthria and anarthria: Secondary | ICD-10-CM | POA: Diagnosis present

## 2019-04-23 DIAGNOSIS — E785 Hyperlipidemia, unspecified: Secondary | ICD-10-CM

## 2019-04-23 DIAGNOSIS — I651 Occlusion and stenosis of basilar artery: Secondary | ICD-10-CM | POA: Diagnosis present

## 2019-04-23 DIAGNOSIS — E1165 Type 2 diabetes mellitus with hyperglycemia: Secondary | ICD-10-CM | POA: Diagnosis present

## 2019-04-23 DIAGNOSIS — N390 Urinary tract infection, site not specified: Secondary | ICD-10-CM

## 2019-04-23 DIAGNOSIS — R29713 NIHSS score 13: Secondary | ICD-10-CM | POA: Diagnosis present

## 2019-04-23 DIAGNOSIS — R29733 NIHSS score 33: Secondary | ICD-10-CM | POA: Diagnosis not present

## 2019-04-23 DIAGNOSIS — Z20828 Contact with and (suspected) exposure to other viral communicable diseases: Secondary | ICD-10-CM | POA: Diagnosis present

## 2019-04-23 DIAGNOSIS — R29810 Facial weakness: Secondary | ICD-10-CM | POA: Diagnosis present

## 2019-04-23 DIAGNOSIS — R4701 Aphasia: Secondary | ICD-10-CM | POA: Diagnosis present

## 2019-04-23 DIAGNOSIS — G9349 Other encephalopathy: Secondary | ICD-10-CM | POA: Diagnosis present

## 2019-04-23 DIAGNOSIS — J9601 Acute respiratory failure with hypoxia: Secondary | ICD-10-CM | POA: Diagnosis not present

## 2019-04-23 DIAGNOSIS — R200 Anesthesia of skin: Secondary | ICD-10-CM | POA: Diagnosis present

## 2019-04-23 DIAGNOSIS — R1310 Dysphagia, unspecified: Secondary | ICD-10-CM | POA: Diagnosis present

## 2019-04-23 DIAGNOSIS — R509 Fever, unspecified: Secondary | ICD-10-CM

## 2019-04-23 DIAGNOSIS — H5500 Unspecified nystagmus: Secondary | ICD-10-CM | POA: Diagnosis present

## 2019-04-23 DIAGNOSIS — E46 Unspecified protein-calorie malnutrition: Secondary | ICD-10-CM | POA: Diagnosis not present

## 2019-04-23 DIAGNOSIS — R569 Unspecified convulsions: Secondary | ICD-10-CM | POA: Diagnosis not present

## 2019-04-23 DIAGNOSIS — F321 Major depressive disorder, single episode, moderate: Secondary | ICD-10-CM | POA: Diagnosis not present

## 2019-04-23 DIAGNOSIS — R449 Unspecified symptoms and signs involving general sensations and perceptions: Secondary | ICD-10-CM | POA: Diagnosis present

## 2019-04-23 DIAGNOSIS — G8191 Hemiplegia, unspecified affecting right dominant side: Secondary | ICD-10-CM | POA: Diagnosis present

## 2019-04-23 DIAGNOSIS — I63432 Cerebral infarction due to embolism of left posterior cerebral artery: Secondary | ICD-10-CM | POA: Diagnosis present

## 2019-04-23 DIAGNOSIS — I10 Essential (primary) hypertension: Secondary | ICD-10-CM | POA: Diagnosis present

## 2019-04-23 DIAGNOSIS — D6869 Other thrombophilia: Secondary | ICD-10-CM | POA: Diagnosis not present

## 2019-04-23 DIAGNOSIS — H5340 Unspecified visual field defects: Secondary | ICD-10-CM | POA: Diagnosis present

## 2019-04-23 DIAGNOSIS — H532 Diplopia: Secondary | ICD-10-CM | POA: Diagnosis present

## 2019-04-23 LAB — FERRITIN: Ferritin: 115 ng/mL (ref 24–336)

## 2019-04-23 LAB — CBC
HCT: 39.2 % (ref 39.0–52.0)
Hemoglobin: 12.1 g/dL — ABNORMAL LOW (ref 13.0–17.0)
MCH: 22.4 pg — ABNORMAL LOW (ref 26.0–34.0)
MCHC: 30.9 g/dL (ref 30.0–36.0)
MCV: 72.5 fL — ABNORMAL LOW (ref 80.0–100.0)
Platelets: 347 10*3/uL (ref 150–400)
RBC: 5.41 MIL/uL (ref 4.22–5.81)
RDW: 14 % (ref 11.5–15.5)
WBC: 7 10*3/uL (ref 4.0–10.5)
nRBC: 0 % (ref 0.0–0.2)

## 2019-04-23 LAB — IRON AND TIBC
Iron: 38 ug/dL — ABNORMAL LOW (ref 45–182)
Saturation Ratios: 13 % — ABNORMAL LOW (ref 17.9–39.5)
TIBC: 291 ug/dL (ref 250–450)
UIBC: 253 ug/dL

## 2019-04-23 LAB — BASIC METABOLIC PANEL
Anion gap: 12 (ref 5–15)
BUN: 8 mg/dL (ref 6–20)
CO2: 25 mmol/L (ref 22–32)
Calcium: 9.4 mg/dL (ref 8.9–10.3)
Chloride: 100 mmol/L (ref 98–111)
Creatinine, Ser: 0.8 mg/dL (ref 0.61–1.24)
GFR calc Af Amer: >60 mL/min (ref >60)
GFR calc non Af Amer: >60 mL/min (ref >60)
Glucose, Bld: 251 mg/dL — ABNORMAL HIGH (ref 70–99)
Potassium: 3.9 mmol/L (ref 3.5–5.1)
Sodium: 137 mmol/L (ref 135–145)

## 2019-04-23 LAB — GLUCOSE, CAPILLARY
Glucose-Capillary: 231 mg/dL — ABNORMAL HIGH (ref 70–99)
Glucose-Capillary: 323 mg/dL — ABNORMAL HIGH (ref 70–99)
Glucose-Capillary: 286 mg/dL — ABNORMAL HIGH (ref 70–99)

## 2019-04-23 MED ORDER — FERROUS SULFATE 325 (65 FE) MG PO TABS
325.0000 mg | ORAL_TABLET | Freq: Two times a day (BID) | ORAL | Status: AC
  Administered 2019-04-24 – 2019-05-07 (×28): 325 mg via ORAL
  Filled 2019-04-23 (×29): qty 1

## 2019-04-23 MED ORDER — AMLODIPINE BESYLATE 10 MG PO TABS
10.0000 mg | ORAL_TABLET | Freq: Every day | ORAL | Status: DC
  Administered 2019-04-24 – 2019-05-27 (×34): 10 mg via ORAL
  Filled 2019-04-23 (×24): qty 1
  Filled 2019-04-23: qty 2
  Filled 2019-04-23 (×9): qty 1

## 2019-04-23 NOTE — Progress Notes (Signed)
   04/23/19 1519  Vitals  Temp 98.2 F (36.8 C)  BP (!) 141/90  MAP (mmHg) 104  BP Location Right Arm  BP Method Automatic  Patient Position (if appropriate) Lying  Pulse Rate 95  Pulse Rate Source Dinamap  Resp 18  Oxygen Therapy  SpO2 97 %  O2 Device Room Air    At approximately 1510, nurse found patient laying on his right side on the floor. Upon assessment, vital signs were stable, there were no visible injuries, and patient did not complain of any pain. No neurological changes were also noted. Dr. Phoebe Sharps PA [place] Schooner was paged. He stated that Dr. Erlinda Hong was informed and that there were no new orders being place. Post fall huddle was completed. Will continue to monitor.

## 2019-04-23 NOTE — Progress Notes (Signed)
Unable to contact family regarding patient's fall. Voicemail left.

## 2019-04-23 NOTE — Progress Notes (Addendum)
STROKE TEAM PROGRESS NOTE   INTERVAL HISTORY Patient sitting in chair, not in distress, no more fever. Still has ophthalmoplegia, severe dysarthria and right side weakness.   Vitals:   04/23/19 0341 04/23/19 0725 04/23/19 1140 04/23/19 1519  BP: (!) 148/91 (!) 150/99 (!) 137/93 (!) 141/90  Pulse: 85 74 89 95  Resp: 17 16 16 18   Temp: 98.4 F (36.9 C) 97.6 F (36.4 C) 98.4 F (36.9 C) 98.2 F (36.8 C)  TempSrc: Oral Oral Oral   SpO2:  99% 98% 97%  Weight:      Height:        CBC:  Recent Labs  Lab 04/20/19 1619 04/23/19 0736  WBC 7.7 7.0  HGB 11.9* 12.1*  HCT 38.4* 39.2  MCV 72.7* 72.5*  PLT 275 517    Basic Metabolic Panel:  Recent Labs  Lab 04/21/19 0425 04/23/19 0736  NA 138 137  K 3.5 3.9  CL 102 100  CO2 23 25  GLUCOSE 241* 251*  BUN 5* 8  CREATININE 0.79 0.80  CALCIUM 8.7* 9.4    IMAGING  CT Head WO Contrast 04/16/2019 Areas of acute infarction are showing low-density and swelling but no macroscopic hemorrhage by CT. As shown by previous MRI, there is involvement of the cerebellum, left cerebral peduncle and thalamus and left posteromedial temporal lobe and occipital lobe. Tiny focus of infarction in the right occipital lobe as well.  [name] 04/15/2019 1. Acute infarction throughout the left PCA territory (including most of the left thalamus), left more than right cerebellum, and right pons. Petechial hemorrhage is seen at the level of the pons.  2. Prior left frontal infarcts and microvascular ischemic gliosis.   Cerebral angiogram 04/14/2019 4 vessel cerebral arteriogram followed by complete revascularization of occluded Lt PCA  With x 2 passes with 26mm x 38mm embotrap retriever  device achieving a TICI 3 revascularization.  Ct Code Stroke Cta Head W/wo Contrast Ct Code Stroke Cta Neck W/wo Contrast Ct Code Stroke Cta Cerebral Perfusion W/wo Contrast 04/14/2019 1. Left P2 occlusion with downstream reconstitution. Qualitative  assessment of CT perfusion maps shows ischemia without visible core infarct in the left occipital lobe.  2. Mild atherosclerosis in the neck without flow limiting stenosis or embolic source seen.  3. No large vessel occlusion in the anterior circulation.  4. Mild mid basilar narrowing.   Ct Head Code Stroke Wo Contrast 04/14/2019 1. No acute finding.  2. Small remote appearing infarcts in the left frontal lobe.    LE Doppler 04/15/2019 Right: There is no evidence of deep vein thrombosis in the lower extremity. No cystic structure found in the popliteal fossa. Left: There is no evidence of deep vein thrombosis in the lower extremity. No cystic structure found in the popliteal fossa.  2D Echocardiogram 04/15/2019  1. The left ventricle has normal systolic function, with an ejection fraction of 55-60%. The cavity size was normal. Left ventricular diastolic parameters were normal. No evidence of left ventricular regional wall motion abnormalities.  2. The right ventricle has normal systolic function. The cavity was normal. There is no increase in right ventricular wall thickness.  3. The mitral valve is abnormal. Mild thickening of the mitral valve leaflet. The MR jet is posteriorly-directed.  4. The tricuspid valve is grossly normal.  5. The aortic valve is tricuspid. No stenosis of the aortic valve.  6. The aorta is normal unless otherwise noted.  7. The inferior vena cava was normal in size with <  50% respiratory variability. SUMMARY LVEF 55-60%, normal wall thickness, normal wall motion, normal diastolic function, mild posteriorly directed MR, normal size IVC that does not collapse, no PFO by color doppler  FINDINGS  Left Ventricle: The left ventricle has normal systolic function, with an ejection fraction of 55-60%. The cavity size was normal. There is no increase in left ventricular wall thickness. Left ventricular diastolic parameters were normal. Normal left ventricular filling pressures  No evidence of left ventricular regional wall motion abnormalities..  EEG  04/15/2019 This study is suggestive of cortical dysfunction in the left hemisphere secondary to underlying stroke as well as mild diffuse encephalopathy. No seizures or definite epileptiform discharges were seen throughout the recording.  TCD w/ bubble 04/18/2019  No HTIS heard during rest. No HITS heard during valsalva.  TEE 04/19/2019 LVEF 55-60% No LA/LAA thrombus or mass No ASD or PFO by color flow Doppler or saline microcavitation study. Mild AR, mild MR.   PHYSICAL EXAM      General - Well nourished, well developed, African gentleman who is not in distress  Ophthalmologic - fundi not visualized due to noncooperation.  Cardiovascular - Regular rate and rhythm.  Neuro - awake, alert, eyes open, following most of the simple commands. Disconjugate eyes, left eye CN III palsy with pupil sparing, right eye CN VI palsy, PERRL. Blinking to visual threat bilaterally. Right facial droop, tongue protrusion not cooperative, against gravity of LUE but significant ataxia, able to follow simple commands with left hand. lifts left leg slightly off of the bed on command, follow simple commands of the left foot. On pain stimulation, slight withdraw of RLE but no movement of RUE or RLE. DTR 1+ and no babinski. Sensation and gait not tested.   ASSESSMENT/PLAN [name] with no significant past medical history presenting with right-sided weakness, decreased sensation on the right, diplopia and headache. Complete revascularization of occluded L PCA w/ mechanical thrombectomy.  Stroke: embolic multifocal posterior circulation infarcts with left P2 occlusion s/p IR with TICI3 reperfusion - likely embolic secondary to hypercoagulability secondary to metabolic syndrome  Code Stroke CT head No acute abnormality. Old infarct L frontal lobe.   CTA head & neck L P2 occlusion. Mild neck atherosclerosis . No LVO  anterior circulation. Mild mid BA narrowing.  CT perfusion ischemia w/o visible core  CT Head WO Contrast - 04/16/19 - Areas of acute infarction are showing low-density and swelling but no macroscopic hemorrhage by CT.  As shown by previous MRI, there is involvement of the cerebellum, left cerebral peduncle and thalamus and left posteromedial temporal lobe and occipital lobe. Tiny focus of infarction in the right occipital lobe as well.  Cerebral angio TICI reperfusion of occluded L PCA  MRI  L PCA (including thalamus), L>R cerebellum, R pontine and left midbrain infarct. Petechial hemorrhage seen in pons. Old L frontal infarcts.   LE Doppler no DVT - no evidence of deep vein thrombosis   2D Echo - EF 55-60%.  No cardiac source of emboli identified.   TCD bubble no HITS  TEE neg  UDS - pos for benzos - UDS done on 9/1  EEG - cortical dysfunction but no seizures.  LDL 88  HgbA1c 12.3  Lovenox 40 mg sq daily  for VTE prophylaxis  No antithrombotic prior to admission, now on aspirin 300 mg suppository daily. Now on aspirin 81 mg and plavix 75 mg daily x 3 weeks, then aspirin alone.   Therapy recommendations:  CIR  Disposition:  pending   Possible seizure  Full body rigid shaking hours post IR, L>R  Loaded with keppra  EEG - cortical dysfunction but no seizures.  Continue keppra 500mg  bid  Hypertension  No hx of HTN, on no home meds  Placed On cleviprex gtt for BP control post IR, now off . BP stable but at high end . Increase amlodipine 5->10  . Long-term BP goal normotensive  Hyperlipidemia  Home meds:  No statin  LDL 88, goal < 70  Add lipitor 20  Continue statin at discharge  Diabetes type II, new diagnosis, Uncontrolled  HgbA1c 12.3, goal < 7.0  CBGs  SSI - increase correction scale per DB RN consult  Hyperglycemia, improved   Likely hypercoagulable state associated with uncontrolled DM  Dysphagia . Secondary to stroke . Speech on  board . cleared for Dysphagia 3 nectar thick liquids with MBS  UTI with fever  UA showed WBC > 50  Urine culture enterobactor and staph coag neg  Sensitive to cipro  On cipro 500mg  bid for 5 days 9/4>>  Fever resolved   Other Stroke Risk Factors   Small remote appearing infarcts in the left frontal lobe by CT and MRI  Other Active Problems  Microcytic anemia - iron 38, Ferritin 115 - put on iron tab bid for 14 days.   Pt found on floor today by nursing staff. No obvious injury, complaints of pain, or neurologic changes. Discussed with Dr Roda Shutters . Monitor for now.  Hospital day # 9  Marvel Plan, MD PhD Stroke Neurology 04/23/2019 8:02 PM   To contact Stroke Continuity provider, please refer to [email or web address]. After hours, contact General Neurology

## 2019-04-23 NOTE — Progress Notes (Signed)
Physical Therapy Treatment Patient Details Name: Rodney Collier MRN: [id number] DOB: 04-19-1966 Today's Date: 04/23/2019    History of Present Illness Pt is a 53 y.o. M with no known PMH who presents with right sided weakness, decreased sensation on right, diplopia and headache. CT showing remote L frontal infarcts, CTA head/neck with L P2 occlusion, MRI with acute L PCA infarct including much of L thalamus, L > R cerebellum, R pons. Now s/p complete revascularization of occluded L PCA with mechanical thrombectomy. ETT 8/27-8/28; self extubated 8/28.    PT Comments    Pt improving slowly.  Still with inattention, balance issues in both sitting and standing with significant R hemiparesis.  Pt has little sensation on the right, but surprisingly can assist with sit to stand on the right and assist with some right knee control in stance.  Pivot transfers require more supportive assist.    Follow Up Recommendations  CIR;Supervision/Assistance - 24 hour     Equipment Recommendations  Other (comment)    Recommendations for Other Services Rehab consult     Precautions / Restrictions Precautions Precautions: Fall Precaution Comments: R hemiparesis and neglect    Mobility  Bed Mobility Overal bed mobility: Needs Assistance Bed Mobility: Rolling;Sidelying to Sit Rolling: Max assist Sidelying to sit: Max assist;+2 for safety/equipment;+2 for physical assistance       General bed mobility comments: pt assisted bridging away from EOB.  Cued and assisted R side toward left roll facilitating normal movement and truncal assist up via L elbow.  Transfers Overall transfer level: Needs assistance Equipment used: None Transfers: Sit to/from Stand Sit to Stand: Mod assist;+2 safety/equipment;+2 physical assistance   Squat pivot transfers: Mod assist;+2 physical assistance     General transfer comment: cued for technique and assisted pt forward with boost upright.  Pt able to attain upright  stance with R knee guarding and stability assist  Ambulation/Gait             General Gait Details: unable at this time. pre-gait activity of weight shifts in [place]. pt attempted to begin shifting weight to R to be able to lift L heel but unable to complete.    Stairs             Wheelchair Mobility    Modified Rankin (Stroke Patients Only) Modified Rankin (Stroke Patients Only) Pre-Morbid Rankin Score: No symptoms Modified Rankin: Severe disability     Balance Overall balance assessment: Needs assistance Sitting-balance support: Feet supported;Single extremity supported Sitting balance-Leahy Scale: Poor Sitting balance - Comments: Worked on midline sitting with inhibited push with L UE and visual /tactile cues to come into and hold midline. Postural control: Right lateral lean;Posterior lean Standing balance support: Bilateral upper extremity supported Standing balance-Leahy Scale: Poor Standing balance comment: reliant on external support.  Worked on bil stance, right knee control, w/shift with truncal rotation.                            Cognition Arousal/Alertness: Awake/alert Behavior During Therapy: Flat affect Overall Cognitive Status: Impaired/Different from baseline Area of Impairment: Problem solving;Following commands;Safety/judgement;Attention;Memory;Awareness                   Current Attention Level: Sustained   Following Commands: Follows one step commands with increased time Safety/Judgement: Decreased awareness of safety;Decreased awareness of deficits Awareness: Intellectual Problem Solving: Slow processing;Difficulty sequencing;Requires verbal cues;Requires tactile cues;Decreased initiation        Exercises  General Comments        Pertinent Vitals/Pain Pain Assessment: No/denies pain    Home Living                      Prior Function            PT Goals (current goals can now be found in the  care plan section) Acute Rehab PT Goals Patient Stated Goal: unable PT Goal Formulation: Patient unable to participate in goal setting Time For Goal Achievement: 04/30/19 Potential to Achieve Goals: Fair Progress towards PT goals: Progressing toward goals    Frequency    Min 4X/week      PT Plan Current plan remains appropriate    Co-evaluation              AM-PAC PT "6 Clicks" Mobility   Outcome Measure  Help needed turning from your back to your side while in a flat bed without using bedrails?: A Lot Help needed moving from lying on your back to sitting on the side of a flat bed without using bedrails?: Total Help needed moving to and from a bed to a chair (including a wheelchair)?: Total Help needed standing up from a chair using your arms (e.g., wheelchair or bedside chair)?: A Lot Help needed to walk in hospital room?: Total Help needed climbing 3-5 steps with a railing? : Total 6 Click Score: 8    End of Session   Activity Tolerance: Patient tolerated treatment well;Patient limited by fatigue Patient left: with call bell/phone within reach;in chair;with chair alarm set Nurse Communication: Mobility status PT Visit Diagnosis: Other abnormalities of gait and mobility (R26.89);Other symptoms and signs involving the nervous system (R29.898) Hemiplegia - Right/Left: Right Hemiplegia - dominant/non-dominant: Dominant Hemiplegia - caused by: Cerebral infarction     Time: [id number] PT Time Calculation (min) (ACUTE ONLY): 26 min  Charges:  $Therapeutic Activity: 8-22 mins $Neuromuscular Re-education: 8-22 mins                     04/23/2019  [place] BingKen Tomeko Collier, PT Acute Rehabilitation Services [phone]  (pager) [phone]  (office)   Rodney GumKenneth V Candiss Collier 04/23/2019, 4:00 PM

## 2019-04-24 DIAGNOSIS — E8881 Metabolic syndrome: Secondary | ICD-10-CM

## 2019-04-24 DIAGNOSIS — I6340 Cerebral infarction due to embolism of unspecified cerebral artery: Secondary | ICD-10-CM

## 2019-04-24 LAB — GLUCOSE, CAPILLARY
Glucose-Capillary: 295 mg/dL — ABNORMAL HIGH (ref 70–99)
Glucose-Capillary: 261 mg/dL — ABNORMAL HIGH (ref 70–99)
Glucose-Capillary: 213 mg/dL — ABNORMAL HIGH (ref 70–99)

## 2019-04-24 LAB — CBC
HCT: 39 % (ref 39.0–52.0)
Hemoglobin: 12.2 g/dL — ABNORMAL LOW (ref 13.0–17.0)
MCH: 22.9 pg — ABNORMAL LOW (ref 26.0–34.0)
MCHC: 31.3 g/dL (ref 30.0–36.0)
MCV: 73.2 fL — ABNORMAL LOW (ref 80.0–100.0)
Platelets: 384 10*3/uL (ref 150–400)
RBC: 5.33 MIL/uL (ref 4.22–5.81)
RDW: 14.1 % (ref 11.5–15.5)
WBC: 7.1 10*3/uL (ref 4.0–10.5)
nRBC: 0 % (ref 0.0–0.2)

## 2019-04-24 LAB — BASIC METABOLIC PANEL
Anion gap: 11 (ref 5–15)
BUN: 11 mg/dL (ref 6–20)
CO2: 25 mmol/L (ref 22–32)
Calcium: 9.5 mg/dL (ref 8.9–10.3)
Chloride: 100 mmol/L (ref 98–111)
Creatinine, Ser: 0.84 mg/dL (ref 0.61–1.24)
GFR calc Af Amer: >60 mL/min (ref >60)
GFR calc non Af Amer: >60 mL/min (ref >60)
Glucose, Bld: 268 mg/dL — ABNORMAL HIGH (ref 70–99)
Potassium: 4.1 mmol/L (ref 3.5–5.1)
Sodium: 136 mmol/L (ref 135–145)

## 2019-04-24 MED ORDER — METFORMIN HCL 850 MG PO TABS
850.0000 mg | ORAL_TABLET | Freq: Two times a day (BID) | ORAL | Status: DC
  Administered 2019-04-24 – 2019-04-29 (×11): 850 mg via ORAL
  Filled 2019-04-24 (×12): qty 1
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
## 2019-04-24 NOTE — Progress Notes (Signed)
Spoke with Staff RN about patient. Will continue to follow patient for his diabetes control and support diabetes education when patient is able to comprehend.   Will continue to monitor blood sugars while in the hospital.  Harvel Ricks RN BSN CDE Diabetes Coordinator Pager: [phone]  8am-5pm

## 2019-04-24 NOTE — Progress Notes (Addendum)
STROKE TEAM PROGRESS NOTE   INTERVAL HISTORY Patient sitting in bed, not in distress, no fever. Still has ophthalmoplegia, severe dysarthria and right side weakness. Hyperglycemia, add metformin. Will have DM coordinator on board.    Vitals:   04/23/19 1926 04/23/19 2333 04/24/19 0325 04/24/19 0739  BP: (!) 165/88 137/83 (!) 144/76 (!) 146/85  Pulse: 92 87 78 75  Resp: 18 18 18    Temp: 98.3 F (36.8 C) 98 F (36.7 C) 98 F (36.7 C) 98.1 F (36.7 C)  TempSrc: Oral Oral Oral Oral  SpO2: 98% 99% 98% 99%  Weight:      Height:        CBC:  Recent Labs  Lab 04/23/19 0736 04/24/19 0444  WBC 7.0 7.1  HGB 12.1* 12.2*  HCT 39.2 39.0  MCV 72.5* 73.2*  PLT 347 409    Basic Metabolic Panel:  Recent Labs  Lab 04/23/19 0736 04/24/19 0444  NA 137 136  K 3.9 4.1  CL 100 100  CO2 25 25  GLUCOSE 251* 268*  BUN 8 11  CREATININE 0.80 0.84  CALCIUM 9.4 9.5    IMAGING  CT Head WO Contrast 04/16/2019 Areas of acute infarction are showing low-density and swelling but no macroscopic hemorrhage by CT. As shown by previous MRI, there is involvement of the cerebellum, left cerebral peduncle and thalamus and left posteromedial temporal lobe and occipital lobe. Tiny focus of infarction in the right occipital lobe as well.  [name] 04/15/2019 1. Acute infarction throughout the left PCA territory (including most of the left thalamus), left more than right cerebellum, and right pons. Petechial hemorrhage is seen at the level of the pons.  2. Prior left frontal infarcts and microvascular ischemic gliosis.   Cerebral angiogram 04/14/2019 4 vessel cerebral arteriogram followed by complete revascularization of occluded Lt PCA  With x 2 passes with 23mm x 31mm embotrap retriever  device achieving a TICI 3 revascularization.  Ct Code Stroke Cta Head W/wo Contrast Ct Code Stroke Cta Neck W/wo Contrast Ct Code Stroke Cta Cerebral Perfusion W/wo Contrast 04/14/2019 1. Left P2  occlusion with downstream reconstitution. Qualitative assessment of CT perfusion maps shows ischemia without visible core infarct in the left occipital lobe.  2. Mild atherosclerosis in the neck without flow limiting stenosis or embolic source seen.  3. No large vessel occlusion in the anterior circulation.  4. Mild mid basilar narrowing.   Ct Head Code Stroke Wo Contrast 04/14/2019 1. No acute finding.  2. Small remote appearing infarcts in the left frontal lobe.    LE Doppler 04/15/2019 Right: There is no evidence of deep vein thrombosis in the lower extremity. No cystic structure found in the popliteal fossa. Left: There is no evidence of deep vein thrombosis in the lower extremity. No cystic structure found in the popliteal fossa.  2D Echocardiogram 04/15/2019  1. The left ventricle has normal systolic function, with an ejection fraction of 55-60%. The cavity size was normal. Left ventricular diastolic parameters were normal. No evidence of left ventricular regional wall motion abnormalities.  2. The right ventricle has normal systolic function. The cavity was normal. There is no increase in right ventricular wall thickness.  3. The mitral valve is abnormal. Mild thickening of the mitral valve leaflet. The MR jet is posteriorly-directed.  4. The tricuspid valve is grossly normal.  5. The aortic valve is tricuspid. No stenosis of the aortic valve.  6. The aorta is normal unless otherwise noted.  7. The  inferior vena cava was normal in size with <50% respiratory variability. SUMMARY LVEF 55-60%, normal wall thickness, normal wall motion, normal diastolic function, mild posteriorly directed MR, normal size IVC that does not collapse, no PFO by color doppler  FINDINGS  Left Ventricle: The left ventricle has normal systolic function, with an ejection fraction of 55-60%. The cavity size was normal. There is no increase in left ventricular wall thickness. Left ventricular diastolic parameters  were normal. Normal left ventricular filling pressures No evidence of left ventricular regional wall motion abnormalities..  EEG  04/15/2019 This study is suggestive of cortical dysfunction in the left hemisphere secondary to underlying stroke as well as mild diffuse encephalopathy. No seizures or definite epileptiform discharges were seen throughout the recording.  TCD w/ bubble 04/18/2019  No HTIS heard during rest. No HITS heard during valsalva.  TEE 04/19/2019 LVEF 55-60% No LA/LAA thrombus or mass No ASD or PFO by color flow Doppler or saline microcavitation study. Mild AR, mild MR.   PHYSICAL EXAM      General - Well nourished, well developed, African gentleman who is not in distress  Ophthalmologic - fundi not visualized due to noncooperation.  Cardiovascular - Regular rate and rhythm.  Neuro - awake, alert, eyes open, following most of the simple commands. Disconjugate eyes, left eye CN III palsy with pupil sparing, right eye CN VI palsy, PERRL. Blinking to visual threat bilaterally. Right facial droop, tongue protrusion not cooperative, against gravity of LUE but significant ataxia, able to follow simple commands with left hand. lifts left leg slightly off of the bed on command, follow simple commands of the left foot. On pain stimulation, slight withdraw of RLE but no movement of RUE or RLE. DTR 1+ and no babinski. Sensation and gait not tested.   ASSESSMENT/PLAN [name] with no significant past medical history presenting with right-sided weakness, decreased sensation on the right, diplopia and headache. Complete revascularization of occluded L PCA w/ mechanical thrombectomy.  Stroke: embolic multifocal posterior circulation infarcts with left P2 occlusion s/p IR with TICI3 reperfusion - likely embolic secondary to hypercoagulability secondary to metabolic syndrome  Code Stroke CT head No acute abnormality. Old infarct L frontal lobe.   CTA head &  neck L P2 occlusion. Mild neck atherosclerosis . No LVO anterior circulation. Mild mid BA narrowing.  CT perfusion ischemia w/o visible core  CT Head WO Contrast - 04/16/19 - Areas of acute infarction are showing low-density and swelling but no macroscopic hemorrhage by CT.  As shown by previous MRI, there is involvement of the cerebellum, left cerebral peduncle and thalamus and left posteromedial temporal lobe and occipital lobe. Tiny focus of infarction in the right occipital lobe as well.  Cerebral angio TICI reperfusion of occluded L PCA  MRI  L PCA (including thalamus), L>R cerebellum, R pontine and left midbrain infarct. Petechial hemorrhage seen in pons. Old L frontal infarcts.   LE Doppler no DVT - no evidence of deep vein thrombosis   2D Echo - EF 55-60%.  No cardiac source of emboli identified.   TCD bubble no HITS  TEE neg  UDS - pos for benzos - UDS done on 9/1  EEG - cortical dysfunction but no seizures.  LDL 88  HgbA1c 12.3  Lovenox 40 mg sq daily  for VTE prophylaxis  No antithrombotic prior to admission, now on aspirin 300 mg suppository daily. Now on aspirin 81 mg and plavix 75 mg daily x 3 weeks,  then aspirin alone. (Plavix started 04/21/19 - last dose 05/12/19)  Therapy recommendations:  CIR  Disposition:  pending   Possible seizure  Full body rigid shaking hours post IR, L>R  Loaded with keppra  EEG - cortical dysfunction but no seizures.  Continue keppra 500mg  bid  Hypertension  No hx of HTN, on no home meds  Placed On cleviprex gtt for BP control post IR, now off . BP stable but at high end . Increase amlodipine 5->10  . Long-term BP goal normotensive  Hyperlipidemia  Home meds:  No statin  LDL 88, goal < 70  Add lipitor 20  Continue statin at discharge  Diabetes type II, new diagnosis, Uncontrolled  HgbA1c 12.3, goal < 7.0  CBGs  SSI - increase correction scale per DB RN consult  Hyperglycemia  Put on metformin 850  bid  Requested DM coordinator consult  Likely hypercoagulable state associated with uncontrolled DM  Dysphagia . Secondary to stroke . Speech on board . cleared for Dysphagia 3 nectar thick liquids with MBS . Encourage po intake . Continue IVF @ 40  UTI with fever  UA showed WBC > 50  Urine culture enterobactor and staph coag neg  Sensitive to cipro  On cipro 500mg  bid for 5 days (started 9/4>>)  Fever resolved   Other Stroke Risk Factors   Small remote appearing infarcts in the left frontal lobe by CT and MRI  Other Active Problems  Microcytic anemia - iron 38, Ferritin 115 - put on iron tab bid for 14 days.   Pt found on floor 04/23/19 by nursing staff. No obvious injury, complaints of pain, or neurologic changes.   Hospital day # 10  I had long discussion with daughter Rodney Collier over the phone, updated pt current condition, treatment plan and potential prognosis. She expressed understanding and appreciation. She is worrying about pt insurance may not cover CIR at [facility]. Will let CIR and SW know.  Marvel PlanJindong Tikisha Molinaro, MD PhD Stroke Neurology 04/24/2019 12:14 PM    To contact Stroke Continuity provider, please refer to [email or web address]. After hours, contact General Neurology

## 2019-04-24 NOTE — Progress Notes (Signed)
Physical Therapy Treatment Patient Details Name: Rodney Collier MRN: [id number] DOB: [birth year] Today's Date: 04/24/2019    History of Present Illness Pt is a 53 y.o. M with no known PMH who presents with right sided weakness, decreased sensation on right, diplopia and headache. CT showing remote L frontal infarcts, CTA head/neck with L P2 occlusion, MRI with acute L PCA infarct including much of L thalamus, L > R cerebellum, R pons. Now s/p complete revascularization of occluded L PCA with mechanical thrombectomy. ETT 8/27-8/28; self extubated 8/28.    PT Comments    Patient seen for mobility progression. This session focused on sitting/standing balance and functional transfer training. Pt requires min/mod A +2 for sit to stands utilizing Stedy standing frame. Continue to progress as tolerated. Current plan remains appropriate.    Follow Up Recommendations  CIR;Supervision/Assistance - 24 hour     Equipment Recommendations  Other (comment)(TBD next venue)    Recommendations for Other Services       Precautions / Restrictions Precautions Precautions: Fall Precaution Comments: R hemiparesis and inattention Restrictions Weight Bearing Restrictions: No    Mobility  Bed Mobility Overal bed mobility: Needs Assistance Bed Mobility: Rolling;Sidelying to Sit Rolling: Mod assist Sidelying to sit: Max assist       General bed mobility comments: cues for sequencing and use of R rail; assist to bring R LE to EOB and to elevate trunk into sitting   Transfers Overall transfer level: Needs assistance   Transfers: Sit to/from Stand Sit to Stand: Mod assist;+2 safety/equipment;Min assist;+2 physical assistance         General transfer comment: pt stood X 3 trials with use of Stedy standing frame; R UE supported by therapist; assistance reuqired to power up into standing   Ambulation/Gait                 Stairs             Wheelchair Mobility    Modified Rankin  (Stroke Patients Only) Modified Rankin (Stroke Patients Only) Pre-Morbid Rankin Score: No symptoms Modified Rankin: Severe disability     Balance Overall balance assessment: Needs assistance Sitting-balance support: Feet supported;Single extremity supported Sitting balance-Leahy Scale: Poor Sitting balance - Comments: worked on midline sitting and trunk rotation and flexion with approximation of R UE Postural control: Right lateral lean;Posterior lean Standing balance support: Bilateral upper extremity supported Standing balance-Leahy Scale: Poor Standing balance comment: reliant on external support.  Worked on bil stance, right knee control, weight shifting, and mini squats with assistance at R knee to decrased hyperextension/buckling                            Cognition Arousal/Alertness: Awake/alert Behavior During Therapy: Flat affect Overall Cognitive Status: Difficult to assess Area of Impairment: Problem solving;Following commands;Safety/judgement;Awareness                   Current Attention Level: Sustained   Following Commands: Follows one step commands with increased time Safety/Judgement: Decreased awareness of safety;Decreased awareness of deficits Awareness: Intellectual Problem Solving: Slow processing;Difficulty sequencing;Requires verbal cues;Requires tactile cues;Decreased initiation General Comments: pt with very soft voice and unintelligible at times      Exercises      General Comments        Pertinent Vitals/Pain Pain Assessment: No/denies pain    Home Living  Prior Function            PT Goals (current goals can now be found in the care plan section) Progress towards PT goals: Progressing toward goals    Frequency    Min 4X/week      PT Plan Current plan remains appropriate    Co-evaluation              AM-PAC PT "6 Clicks" Mobility   Outcome Measure  Help needed turning from  your back to your side while in a flat bed without using bedrails?: A Lot Help needed moving from lying on your back to sitting on the side of a flat bed without using bedrails?: Total Help needed moving to and from a bed to a chair (including a wheelchair)?: Total Help needed standing up from a chair using your arms (e.g., wheelchair or bedside chair)?: A Lot Help needed to walk in hospital room?: Total Help needed climbing 3-5 steps with a railing? : Total 6 Click Score: 8    End of Session Equipment Utilized During Treatment: Gait belt Activity Tolerance: Patient tolerated treatment well Patient left: with call bell/phone within reach;in bed;with bed alarm set;with SCD's reapplied;Other (comment)(bed in chair position) Nurse Communication: Mobility status PT Visit Diagnosis: Other abnormalities of gait and mobility (R26.89);Other symptoms and signs involving the nervous system (R29.898) Hemiplegia - Right/Left: Right Hemiplegia - dominant/non-dominant: Dominant Hemiplegia - caused by: Cerebral infarction     Time: [id number] PT Time Calculation (min) (ACUTE ONLY): 34 min  Charges:  $Gait Training: 8-22 mins $Neuromuscular Re-education: 8-22 mins                     Erline LevineKellyn Heman Que, PTA Acute Rehabilitation Services Pager: [phone] Office: [phone]     Carolynne EdouardKellyn R Ocean Schildt 04/24/2019, 2:03 PM

## 2019-04-25 DIAGNOSIS — E782 Mixed hyperlipidemia: Secondary | ICD-10-CM

## 2019-04-25 DIAGNOSIS — R1312 Dysphagia, oropharyngeal phase: Secondary | ICD-10-CM

## 2019-04-25 LAB — GLUCOSE, CAPILLARY
Glucose-Capillary: 286 mg/dL — ABNORMAL HIGH (ref 70–99)
Glucose-Capillary: 228 mg/dL — ABNORMAL HIGH (ref 70–99)
Glucose-Capillary: 130 mg/dL — ABNORMAL HIGH (ref 70–99)
Glucose-Capillary: 132 mg/dL — ABNORMAL HIGH (ref 70–99)

## 2019-04-25 LAB — CBC
HCT: 39.5 % (ref 39.0–52.0)
Hemoglobin: 12.3 g/dL — ABNORMAL LOW (ref 13.0–17.0)
MCH: 22.7 pg — ABNORMAL LOW (ref 26.0–34.0)
MCHC: 31.1 g/dL (ref 30.0–36.0)
MCV: 72.7 fL — ABNORMAL LOW (ref 80.0–100.0)
Platelets: 404 10*3/uL — ABNORMAL HIGH (ref 150–400)
RBC: 5.43 MIL/uL (ref 4.22–5.81)
RDW: 14.1 % (ref 11.5–15.5)
WBC: 8.1 10*3/uL (ref 4.0–10.5)
nRBC: 0 % (ref 0.0–0.2)

## 2019-04-25 LAB — BASIC METABOLIC PANEL
Anion gap: 13 (ref 5–15)
BUN: 12 mg/dL (ref 6–20)
CO2: 22 mmol/L (ref 22–32)
Calcium: 9.5 mg/dL (ref 8.9–10.3)
Chloride: 101 mmol/L (ref 98–111)
Creatinine, Ser: 1.02 mg/dL (ref 0.61–1.24)
GFR calc Af Amer: >60 mL/min (ref >60)
GFR calc non Af Amer: >60 mL/min (ref >60)
Glucose, Bld: 285 mg/dL — ABNORMAL HIGH (ref 70–99)
Potassium: 4.4 mmol/L (ref 3.5–5.1)
Sodium: 136 mmol/L (ref 135–145)

## 2019-04-25 MED ORDER — INSULIN DETEMIR 100 UNIT/ML ~~LOC~~ SOLN
5.0000 [IU] | Freq: Two times a day (BID) | SUBCUTANEOUS | Status: DC
  Administered 2019-04-25 – 2019-04-28 (×7): 5 [IU] via SUBCUTANEOUS
  Filled 2019-04-25 (×8): qty 0.05

## 2019-04-25 NOTE — Progress Notes (Signed)
Inpatient Rehabilitation Admissions Coordinator  I contacted pt's daughter, [name] by phone. She is working with [employer] to apply for [place] in  [place] As well as Section 8 housing and caregiver support in [place]. She does not have room at her apartment, lives in second floor and works. She is also contacting pt's brother to ask for assistance in his care. We await for clearer dispo and caregiver support before proceeding with a possible Cir admit. He may need SNF. We will follow up.  Danne Baxter, RN, MSN Rehab Admissions Coordinator [phone] 04/25/2019 12:18 PM

## 2019-04-25 NOTE — Progress Notes (Signed)
STROKE TEAM PROGRESS NOTE   INTERVAL HISTORY Pt lying in bed. No neuro changes. Glucose still constantly > 200, on SSI, 24 units for yesterday. DM coordinator on board, recommend levemir 5U bid. Pending CIR.   Vitals:   04/24/19 2305 04/25/19 0317 04/25/19 0715 04/25/19 1112  BP: (!) 154/85 (!) 161/94 (!) 126/91 (!) 148/96  Pulse: 98 89 86 87  Resp: 18 18 18 18   Temp: 98.3 F (36.8 C) 98.4 F (36.9 C) 98.1 F (36.7 C) 98 F (36.7 C)  TempSrc: Oral Oral    SpO2: 98% 100% 99% 99%  Weight:      Height:        CBC:  Recent Labs  Lab 04/24/19 0444 04/25/19 0339  WBC 7.1 8.1  HGB 12.2* 12.3*  HCT 39.0 39.5  MCV 73.2* 72.7*  PLT 384 404*    Basic Metabolic Panel:  Recent Labs  Lab 04/24/19 0444 04/25/19 0339  NA 136 136  K 4.1 4.4  CL 100 101  CO2 25 22  GLUCOSE 268* 285*  BUN 11 12  CREATININE 0.84 1.02  CALCIUM 9.5 9.5    IMAGING  CT Head WO Contrast 04/16/2019 Areas of acute infarction are showing low-density and swelling but no macroscopic hemorrhage by CT. As shown by previous MRI, there is involvement of the cerebellum, left cerebral peduncle and thalamus and left posteromedial temporal lobe and occipital lobe. Tiny focus of infarction in the right occipital lobe as well.  [name] 04/15/2019 1. Acute infarction throughout the left PCA territory (including most of the left thalamus), left more than right cerebellum, and right pons. Petechial hemorrhage is seen at the level of the pons.  2. Prior left frontal infarcts and microvascular ischemic gliosis.   Cerebral angiogram 04/14/2019 4 vessel cerebral arteriogram followed by complete revascularization of occluded Lt PCA  With x 2 passes with 75mm x 56mm embotrap retriever  device achieving a TICI 3 revascularization.  Ct Code Stroke Cta Head W/wo Contrast Ct Code Stroke Cta Neck W/wo Contrast Ct Code Stroke Cta Cerebral Perfusion W/wo Contrast 04/14/2019 1. Left P2 occlusion with downstream  reconstitution. Qualitative assessment of CT perfusion maps shows ischemia without visible core infarct in the left occipital lobe.  2. Mild atherosclerosis in the neck without flow limiting stenosis or embolic source seen.  3. No large vessel occlusion in the anterior circulation.  4. Mild mid basilar narrowing.   Ct Head Code Stroke Wo Contrast 04/14/2019 1. No acute finding.  2. Small remote appearing infarcts in the left frontal lobe.    LE Doppler 04/15/2019 Right: There is no evidence of deep vein thrombosis in the lower extremity. No cystic structure found in the popliteal fossa. Left: There is no evidence of deep vein thrombosis in the lower extremity. No cystic structure found in the popliteal fossa.  2D Echocardiogram 04/15/2019  1. The left ventricle has normal systolic function, with an ejection fraction of 55-60%. The cavity size was normal. Left ventricular diastolic parameters were normal. No evidence of left ventricular regional wall motion abnormalities.  2. The right ventricle has normal systolic function. The cavity was normal. There is no increase in right ventricular wall thickness.  3. The mitral valve is abnormal. Mild thickening of the mitral valve leaflet. The MR jet is posteriorly-directed.  4. The tricuspid valve is grossly normal.  5. The aortic valve is tricuspid. No stenosis of the aortic valve.  6. The aorta is normal unless otherwise noted.  7.  The inferior vena cava was normal in size with <50% respiratory variability. SUMMARY LVEF 55-60%, normal wall thickness, normal wall motion, normal diastolic function, mild posteriorly directed MR, normal size IVC that does not collapse, no PFO by color doppler  FINDINGS  Left Ventricle: The left ventricle has normal systolic function, with an ejection fraction of 55-60%. The cavity size was normal. There is no increase in left ventricular wall thickness. Left ventricular diastolic parameters were normal. Normal left  ventricular filling pressures No evidence of left ventricular regional wall motion abnormalities..  EEG  04/15/2019 This study is suggestive of cortical dysfunction in the left hemisphere secondary to underlying stroke as well as mild diffuse encephalopathy. No seizures or definite epileptiform discharges were seen throughout the recording.  TCD w/ bubble 04/18/2019  No HTIS heard during rest. No HITS heard during valsalva.  TEE 04/19/2019 LVEF 55-60% No LA/LAA thrombus or mass No ASD or PFO by color flow Doppler or saline microcavitation study. Mild AR, mild MR.   PHYSICAL EXAM     General - Well nourished, well developed, African gentleman who is not in distress  Ophthalmologic - fundi not visualized due to noncooperation.  Cardiovascular - Regular rate and rhythm.  Neuro - awake, alert, eyes open, following most of the simple commands. Disconjugate eyes, left eye CN III palsy with pupil sparing, right eye CN VI palsy, PERRL. Blinking to visual threat bilaterally. Right facial droop, tongue protrusion not cooperative, against gravity of LUE but significant ataxia, able to follow simple commands with left hand. lifts left leg slightly off of the bed on command, follow simple commands of the left foot. On pain stimulation, slight withdraw of RLE but no movement of RUE or RLE. DTR 1+ and no babinski. Sensation and gait not tested.   ASSESSMENT/PLAN [name] with no significant past medical history presenting with right-sided weakness, decreased sensation on the right, diplopia and headache. Complete revascularization of occluded L PCA w/ mechanical thrombectomy.  Stroke: embolic multifocal posterior circulation infarcts with left P2 occlusion s/p IR with TICI3 reperfusion - likely embolic secondary to hypercoagulability secondary to metabolic syndrome  Code Stroke CT head No acute abnormality. Old infarct L frontal lobe.   CTA head & neck L P2 occlusion. Mild  neck atherosclerosis . No LVO anterior circulation. Mild mid BA narrowing.  CT perfusion ischemia w/o visible core  CT Head WO Contrast - 04/16/19 - Areas of acute infarction are showing low-density and swelling but no macroscopic hemorrhage by CT.  As shown by previous MRI, there is involvement of the cerebellum, left cerebral peduncle and thalamus and left posteromedial temporal lobe and occipital lobe. Tiny focus of infarction in the right occipital lobe as well.  Cerebral angio TICI reperfusion of occluded L PCA  MRI  L PCA (including thalamus), L>R cerebellum, R pontine and left midbrain infarct. Petechial hemorrhage seen in pons. Old L frontal infarcts.   LE Doppler no DVT - no evidence of deep vein thrombosis   2D Echo - EF 55-60%.  No cardiac source of emboli identified.   TCD bubble no HITS  TEE neg  UDS - pos for benzos - UDS done on 9/1  EEG - cortical dysfunction but no seizures.  LDL 88  HgbA1c 12.3  Lovenox 40 mg sq daily  for VTE prophylaxis  No antithrombotic prior to admission, now on aspirin 81 mg daily and clopidogrel 75 mg daily x 3 weeks, then aspirin alone. (Plavix started 04/21/19 -  last dose 05/12/19)  Therapy recommendations:  CIR  Disposition:  pending  Pamala Hurry following for possible admission. Concern w/ post hospitalization family support. May need SNF.  Possible seizure  Full body rigid shaking hours post IR, L>R  Loaded with keppra  EEG - cortical dysfunction but no seizures.  Continue keppra 500mg  bid  Hypertension  No hx of HTN, on no home meds  Placed On cleviprex gtt for BP control post IR, now off . BP stable but at high end . Increase amlodipine 5->10  . Long-term BP goal normotensive  Hyperlipidemia  Home meds:  No statin  LDL 88, goal < 70  HDL 39  Add lipitor 20  Continue statin at discharge  Diabetes type II, new diagnosis, Uncontrolled Hyperglycemia  HgbA1c 12.3, goal < 7.0  CBGs  SSI   Put on metformin  850 bid  DM coordinator consulted - recommends levemir 5U bid. Added.  Likely hypercoagulable state associated with uncontrolled DM  Dysphagia . Secondary to stroke . Speech on board . cleared for Dysphagia 3 nectar thick liquids with MBS . Encourage po intake . Continue IVF @ 40  UTI with fever  UA showed WBC > 50  Urine culture enterobactor and staph coag neg  Sensitive to cipro  On cipro 500mg  bid for 5 days (started 9/4>>)  Fever resolved   Other Stroke Risk Factors   Small remote appearing infarcts in the left frontal lobe by CT and MRI  Other Active Problems  Microcytic anemia - iron 38, Ferritin 115 - put on iron tab bid for 14 days.   Pt found on floor 04/23/19 by nursing staff. No obvious injury, complaints of pain, or neurologic changes.   Hospital day # 11   Rosalin Hawking, MD PhD Stroke Neurology 04/25/2019 1:01 PM    To contact Stroke Continuity provider, please refer to [email or web address] After hours, contact General Neurology

## 2019-04-25 NOTE — Progress Notes (Signed)
Inpatient Diabetes Program Recommendations  AACE/ADA: New Consensus Statement on Inpatient Glycemic Control (2015)  Target Ranges:  Prepandial:   less than 140 mg/dL      Peak postprandial:   less than 180 mg/dL (1-2 hours)      Critically ill patients:  140 - 180 mg/dL   Lab Results  Component Value Date   GLUCAP 286 (H) 04/25/2019   HGBA1C 12.3 (H) 04/15/2019    Review of Glycemic Control Results for Rodney Collier, Rodney Collier (MRN [id number]) as of 04/25/2019 09:08  Ref. Range 04/23/2019 21:25 04/24/2019 12:24 04/24/2019 17:05 04/24/2019 21:43 04/25/2019 06:24  Glucose-Capillary Latest Ref Range: 70 - 99 mg/dL 286 (H) 295 (H) 261 (H) 213 (H) 286 (H)   Inpatient Diabetes Program Recommendations:   CBGs consistently >200. Has received Novolog 24 units correction over the past 24 hrs. -Consider Levemir 5 units bid  Thank you, Nani Gasser. Chananya Canizalez, RN, MSN, CDE  Diabetes Coordinator Inpatient Glycemic Control Team Team Pager [phone] (8am-5pm) 04/25/2019 9:09 AM

## 2019-04-25 NOTE — Progress Notes (Signed)
Occupational Therapy Treatment Patient Details Name: Rodney Collier MRN: [id number] DOB: 09/17/1965 Today's Date: 04/25/2019    History of present illness Pt is a 53 y.o. M with no known PMH who presents with right sided weakness, decreased sensation on right, diplopia and headache. CT showing remote L frontal infarcts, CTA head/neck with L P2 occlusion, MRI with acute L PCA infarct including much of L thalamus, L > R cerebellum, R pons. Now s/p complete revascularization of occluded L PCA with mechanical thrombectomy. ETT 8/27-8/28; self extubated 8/28.   OT comments  Pt continues to progress towards goals and present with high motivation. Pt presented with impaired cognition, poor vision, inattention to the right side, and decreased balance and functional use of RUE. Pt performed bed mobility with max A for balance and safety. Pt completed face-washing with min A and VCs to for initiation. Performed upper body dressing with max A due to flaccid RUE. Continue to recommend CIR for dc. Will follow acutely as admitted.    Follow Up Recommendations  CIR(SNF for long term if family not available)    Equipment Recommendations  Other (comment)(to be determined)    Recommendations for Other Services      Precautions / Restrictions Precautions Precautions: Fall Precaution Comments: R hemiparesis and inattention Restrictions Weight Bearing Restrictions: No       Mobility Bed Mobility Overal bed mobility: Needs Assistance Bed Mobility: Rolling;Sidelying to Sit;Sit to Supine Rolling: Mod assist Sidelying to sit: Max assist   Sit to supine: Max assist   General bed mobility comments: Required VCs to for sequencing. Max A to bring legs off EOB and elevate trunk into sitting.  Transfers Overall transfer level: Needs assistance Equipment used: None Transfers: Sit to/from Stand Sit to Stand: Min assist;Min guard;+2 safety/equipment         General transfer comment: Required min guard A  - min A for maintaining postural control and safety while standing in stedy.     Balance Overall balance assessment: Needs assistance Sitting-balance support: Feet supported;Single extremity supported Sitting balance-Leahy Scale: Poor Sitting balance - Comments: Worked on midline sitting. Continues to present with poor body awareness and requires both verbal and tactile cues for upright posture. Postural control: Right lateral lean;Posterior lean Standing balance support: Single extremity supported;During functional activity(Required min A to maintain grasp on stedy with RUE.) Standing balance-Leahy Scale: Poor Standing balance comment: Required min A and VCs for returning to midline following right lateral lean.                            ADL either performed or assessed with clinical judgement   ADL Overall ADL's : Needs assistance/impaired     Grooming: Wash/dry face;Minimal assistance;Sitting(required VCs to initiate task) Grooming Details (indicate cue type and reason): Required min A to reach for wash cloth, and min VCs to bring wash cloth to face. Undershot when reaching to the left for wash cloth.          Upper Body Dressing : Maximal assistance;Sitting(Requires VCs to initiate task) Upper Body Dressing Details (indicate cue type and reason): Required mod A to reach LUE through sleeve, noting undershooting with reach towards sleeve with LUE. Needed Max A to don gown over RUE.                  Functional mobility during ADLs: Min guard;Minimal assistance(Sit <> stand with stedy.) General ADL Comments: Pt demonstrated increased activity tolerance to perform balance  exercises, dressing, and toileting at EOB.  Continues to show poor balance, inattention to right side, vision deficits, and functional use of RUE.     Vision   Vision Assessment?: Yes Eye Alignment: Impaired (comment) Ocular Range of Motion: Restricted on the right;Other (comment)(Will continue  to assess) Tracking/Visual Pursuits: Decreased smoothness of horizontal tracking;Decreased smoothness of vertical tracking;Requires cues, head turns, or add eye shifts to track;Unable to hold eye position out of midline;Decreased smoothness of eye movement to RIGHT superior field;Decreased smoothness of eye movement to RIGHT inferior field Convergence: Impaired (comment)(Disconjugate gaze; unable to bring both eyes to midline.) Visual Fields: Right visual field deficit;Other (comment)(Right inattention/neglect.) Diplopia Assessment: Other (comment)(Patient reporting double vision, unable to provide details.) Depth Perception: Undershoots Additional Comments: No noted nystagmus today. Will continue to assess vision.   Perception     Praxis      Cognition Arousal/Alertness: Awake/alert Behavior During Therapy: Flat affect Overall Cognitive Status: Impaired/Different from baseline Area of Impairment: Attention;Memory;Following commands;Safety/judgement;Awareness;Problem solving                 Orientation Level: (Did not answer any orientation ??'s but responded to [name]) Current Attention Level: Sustained Memory: (Difficulty to assess due to communication) Following Commands: Follows one step commands inconsistently;Follows one step commands with increased time Safety/Judgement: Decreased awareness of safety;Decreased awareness of deficits Awareness: Intellectual Problem Solving: Slow processing;Decreased initiation;Difficulty sequencing;Requires verbal cues;Requires tactile cues General Comments: Pt presented with soft voice and answering some questions. Benefitted from direct, simple cues and increased time.         Exercises Low Level/ICU Exercises Hip ABduction/ADduction: (Leg bent/foot planted. Pt initiating ADD but not ABD) Stabilized Bridging: (Attempted but pt only completed 1 rep)   Shoulder Instructions       General Comments Nurse present at end of session.     Pertinent Vitals/ Pain       Pain Assessment: Faces Faces Pain Scale: No hurt Pain Intervention(s): Monitored during session  Home Living Family/patient expects to be discharged to:: Unsure     Type of Home: House                           Additional Comments: [place] when stroke occurred. Family has been contacted wife is separted and daughter is unable to provide care per chart.       Prior Functioning/Environment Level of Independence: Independent        Comments: Driving   Frequency  Min 2X/week        Progress Toward Goals  OT Goals(current goals can now be found in the care plan section)  Progress towards OT goals: Progressing toward goals  Acute Rehab OT Goals Patient Stated Goal: unable OT Goal Formulation: Patient unable to participate in goal setting Time For Goal Achievement: 05/04/19 Potential to Achieve Goals: Good ADL Goals Pt Will Perform Grooming: with min assist;sitting Pt Will Transfer to Toilet: with max assist;bedside commode;stand pivot transfer Additional ADL Goal #1: pt will complete bed mobility mod (A) as precursor to adls Additional ADL Goal #2: pt will complete 2 step command  Plan Discharge plan remains appropriate    Co-evaluation                 AM-PAC OT "6 Clicks" Daily Activity     Outcome Measure   Help from another person eating meals?: A Lot Help from another person taking care of personal grooming?: Total Help  from another person toileting, which includes using toliet, bedpan, or urinal?: Total Help from another person bathing (including washing, rinsing, drying)?: Total Help from another person to put on and taking off regular upper body clothing?: A Lot Help from another person to put on and taking off regular lower body clothing?: Total 6 Click Score: 8    End of Session Equipment Utilized During Treatment: Gait belt(stedy)  OT Visit Diagnosis:  Unsteadiness on feet (R26.81);Hemiplegia and hemiparesis Hemiplegia - Right/Left: Right Hemiplegia - dominant/non-dominant: Dominant Hemiplegia - caused by: Cerebral infarction   Activity Tolerance Patient tolerated treatment well   Patient Left with call bell/phone within reach;in bed;with bed alarm set;with restraints reapplied   Nurse Communication Mobility status        Time: 7416-3845 OT Time Calculation (min): 37 min  Charges: OT General Charges $OT Visit: 1 Visit OT Treatments $Self Care/Home Management : 8-22 mins $Therapeutic Activity: 8-22 mins  Gus Rankin, OT Student  Gus Rankin 04/25/2019, 2:23 PM

## 2019-04-26 ENCOUNTER — Encounter (HOSPITAL_COMMUNITY): Payer: Self-pay | Admitting: *Deleted

## 2019-04-26 LAB — GLUCOSE, CAPILLARY
Glucose-Capillary: 261 mg/dL — ABNORMAL HIGH (ref 70–99)
Glucose-Capillary: 146 mg/dL — ABNORMAL HIGH (ref 70–99)
Glucose-Capillary: 242 mg/dL — ABNORMAL HIGH (ref 70–99)
Glucose-Capillary: 189 mg/dL — ABNORMAL HIGH (ref 70–99)
Glucose-Capillary: 207 mg/dL — ABNORMAL HIGH (ref 70–99)

## 2019-04-26 LAB — BASIC METABOLIC PANEL
Anion gap: 12 (ref 5–15)
BUN: 10 mg/dL (ref 6–20)
CO2: 23 mmol/L (ref 22–32)
Calcium: 9.4 mg/dL (ref 8.9–10.3)
Chloride: 103 mmol/L (ref 98–111)
Creatinine, Ser: 0.74 mg/dL (ref 0.61–1.24)
GFR calc Af Amer: >60 mL/min (ref >60)
GFR calc non Af Amer: >60 mL/min (ref >60)
Glucose, Bld: 147 mg/dL — ABNORMAL HIGH (ref 70–99)
Potassium: 4.3 mmol/L (ref 3.5–5.1)
Sodium: 138 mmol/L (ref 135–145)

## 2019-04-26 LAB — CBC
HCT: 38.6 % — ABNORMAL LOW (ref 39.0–52.0)
Hemoglobin: 12.1 g/dL — ABNORMAL LOW (ref 13.0–17.0)
MCH: 22.7 pg — ABNORMAL LOW (ref 26.0–34.0)
MCHC: 31.3 g/dL (ref 30.0–36.0)
MCV: 72.4 fL — ABNORMAL LOW (ref 80.0–100.0)
Platelets: 417 10*3/uL — ABNORMAL HIGH (ref 150–400)
RBC: 5.33 MIL/uL (ref 4.22–5.81)
RDW: 13.9 % (ref 11.5–15.5)
WBC: 7.3 10*3/uL (ref 4.0–10.5)
nRBC: 0 % (ref 0.0–0.2)

## 2019-04-26 MED ORDER — CIPROFLOXACIN HCL 500 MG PO TABS: 500.0000 mg | ORAL_TABLET | Freq: Two times a day (BID) | ORAL | Status: DC

## 2019-04-26 MED ORDER — CIPROFLOXACIN HCL 500 MG PO TABS
500.0000 mg | ORAL_TABLET | Freq: Two times a day (BID) | ORAL | Status: AC
  Administered 2019-04-26: 500 mg via ORAL
  Filled 2019-04-26: qty 1

## 2019-04-26 NOTE — Progress Notes (Signed)
STROKE TEAM PROGRESS NOTE   INTERVAL HISTORY Pt sitting in bed, neuro stable. Ate lunch well. Still on IVF. Labs WNL. Pending SNF vs. CIR.    Vitals:   04/26/19 0000 04/26/19 0400 04/26/19 0905 04/26/19 1221  BP: 132/85 132/82 (!) 134/92 (!) 151/93  Pulse: 86 74 78 84  Resp: 20 18 14 19   Temp: 98.4 F (36.9 C) 98.5 F (36.9 C) 98.2 F (36.8 C) 98.2 F (36.8 C)  TempSrc: Oral Oral Oral Oral  SpO2: 98% 98% 99% 99%  Weight:      Height:        CBC:  Recent Labs  Lab 04/25/19 0339 04/26/19 0316  WBC 8.1 7.3  HGB 12.3* 12.1*  HCT 39.5 38.6*  MCV 72.7* 72.4*  PLT 404* 417*    Basic Metabolic Panel:  Recent Labs  Lab 04/25/19 0339 04/26/19 0316  NA 136 138  K 4.4 4.3  CL 101 103  CO2 22 23  GLUCOSE 285* 147*  BUN 12 10  CREATININE 1.02 0.74  CALCIUM 9.5 9.4    IMAGING  CT Head WO Contrast 04/16/2019 Areas of acute infarction are showing low-density and swelling but no macroscopic hemorrhage by CT. As shown by previous MRI, there is involvement of the cerebellum, left cerebral peduncle and thalamus and left posteromedial temporal lobe and occipital lobe. Tiny focus of infarction in the right occipital lobe as well.  [name] 04/15/2019 1. Acute infarction throughout the left PCA territory (including most of the left thalamus), left more than right cerebellum, and right pons. Petechial hemorrhage is seen at the level of the pons.  2. Prior left frontal infarcts and microvascular ischemic gliosis.   Cerebral angiogram 04/14/2019 4 vessel cerebral arteriogram followed by complete revascularization of occluded Lt PCA  With x 2 passes with 62mm x 72mm embotrap retriever  device achieving a TICI 3 revascularization.  Ct Code Stroke Cta Head W/wo Contrast Ct Code Stroke Cta Neck W/wo Contrast Ct Code Stroke Cta Cerebral Perfusion W/wo Contrast 04/14/2019 1. Left P2 occlusion with downstream reconstitution. Qualitative assessment of CT perfusion maps shows  ischemia without visible core infarct in the left occipital lobe.  2. Mild atherosclerosis in the neck without flow limiting stenosis or embolic source seen.  3. No large vessel occlusion in the anterior circulation.  4. Mild mid basilar narrowing.   Ct Head Code Stroke Wo Contrast 04/14/2019 1. No acute finding.  2. Small remote appearing infarcts in the left frontal lobe.    LE Doppler 04/15/2019 Right: There is no evidence of deep vein thrombosis in the lower extremity. No cystic structure found in the popliteal fossa. Left: There is no evidence of deep vein thrombosis in the lower extremity. No cystic structure found in the popliteal fossa.  2D Echocardiogram 04/15/2019  1. The left ventricle has normal systolic function, with an ejection fraction of 55-60%. The cavity size was normal. Left ventricular diastolic parameters were normal. No evidence of left ventricular regional wall motion abnormalities.  2. The right ventricle has normal systolic function. The cavity was normal. There is no increase in right ventricular wall thickness.  3. The mitral valve is abnormal. Mild thickening of the mitral valve leaflet. The MR jet is posteriorly-directed.  4. The tricuspid valve is grossly normal.  5. The aortic valve is tricuspid. No stenosis of the aortic valve.  6. The aorta is normal unless otherwise noted.  7. The inferior vena cava was normal in size with <50% respiratory  variability. SUMMARY LVEF 55-60%, normal wall thickness, normal wall motion, normal diastolic function, mild posteriorly directed MR, normal size IVC that does not collapse, no PFO by color doppler  FINDINGS  Left Ventricle: The left ventricle has normal systolic function, with an ejection fraction of 55-60%. The cavity size was normal. There is no increase in left ventricular wall thickness. Left ventricular diastolic parameters were normal. Normal left ventricular filling pressures No evidence of left ventricular  regional wall motion abnormalities..  EEG  04/15/2019 This study is suggestive of cortical dysfunction in the left hemisphere secondary to underlying stroke as well as mild diffuse encephalopathy. No seizures or definite epileptiform discharges were seen throughout the recording.  TCD w/ bubble 04/18/2019  No HTIS heard during rest. No HITS heard during valsalva.  TEE 04/19/2019 LVEF 55-60% No LA/LAA thrombus or mass No ASD or PFO by color flow Doppler or saline microcavitation study. Mild AR, mild MR.   PHYSICAL EXAM      General - Well nourished, well developed, African gentleman who is not in distress  Ophthalmologic - fundi not visualized due to noncooperation.  Cardiovascular - Regular rate and rhythm.  Neuro - awake, alert, eyes open, following most of the simple commands. Disconjugate eyes, left eye CN III palsy with pupil sparing, right eye CN VI palsy, PERRL. Blinking to visual threat bilaterally. Right facial droop, tongue protrusion not cooperative, against gravity of LUE but significant ataxia, able to follow simple commands with left hand. lifts left leg slightly off of the bed on command, follow simple commands of the left foot. On pain stimulation, slight withdraw of RLE but no movement of RUE or RLE. DTR 1+ and no babinski. Sensation and gait not tested.   ASSESSMENT/PLAN [name] with no significant past medical history presenting with right-sided weakness, decreased sensation on the right, diplopia and headache. Complete revascularization of occluded L PCA w/ mechanical thrombectomy.  Stroke: embolic multifocal posterior circulation infarcts with left P2 occlusion s/p IR with TICI3 reperfusion - likely embolic secondary to hypercoagulability secondary to metabolic syndrome  Code Stroke CT head No acute abnormality. Old infarct L frontal lobe.   CTA head & neck L P2 occlusion. Mild neck atherosclerosis . No LVO anterior circulation. Mild mid  BA narrowing.  CT perfusion ischemia w/o visible core  CT Head WO Contrast - 04/16/19 - Areas of acute infarction are showing low-density and swelling but no macroscopic hemorrhage by CT.  As shown by previous MRI, there is involvement of the cerebellum, left cerebral peduncle and thalamus and left posteromedial temporal lobe and occipital lobe. Tiny focus of infarction in the right occipital lobe as well.  Cerebral angio TICI reperfusion of occluded L PCA  MRI  L PCA (including thalamus), L>R cerebellum, R pontine and left midbrain infarct. Petechial hemorrhage seen in pons. Old L frontal infarcts.   LE Doppler no DVT - no evidence of deep vein thrombosis   2D Echo - EF 55-60%.  No cardiac source of emboli identified.   TCD bubble no HITS  TEE neg  UDS - pos for benzos - UDS done on 9/1  EEG - cortical dysfunction but no seizures.  LDL 88  HgbA1c 12.3  Lovenox 40 mg sq daily  for VTE prophylaxis  No antithrombotic prior to admission, now on aspirin 81 mg daily and clopidogrel 75 mg daily x 3 weeks, then aspirin alone. (Plavix started 04/21/19 - last dose 05/12/19)  Therapy recommendations:  CIR  Disposition:  pending  Britta Mccreedy following for possible admission. Concern w/ post hospitalization family support. May need SNF.  Possible seizure  Full body rigid shaking hours post IR, L>R  Loaded with keppra  EEG - cortical dysfunction but no seizures.  Continue keppra 500mg  bid  Hypertension  No hx of HTN, on no home meds  Placed On cleviprex gtt for BP control post IR, now off . On amlodipine 10  . BP 120-150 . Long-term BP goal normotensive  Hyperlipidemia  Home meds:  No statin  LDL 88, goal < 70  HDL 39  Add lipitor 20  Continue statin at discharge  Diabetes type II, new diagnosis, Uncontrolled Hyperglycemia, improving  HgbA1c 12.3, goal < 7.0  CBGs  SSI   Put on metformin 850 bid  DM coordinator consulted - on levemir 5U bid.  Likely  hypercoagulable state associated with uncontrolled DM  Dysphagia . Secondary to stroke . Speech on board . cleared for Dysphagia 3 nectar thick liquids with MBS-> D2 nectar thick . Encourage po intake . Continue IVF @ 40  UTI with fever  UA showed WBC > 50  Urine culture enterobactor and staph coag neg  Sensitive to cipro  finished cipro 500mg  bid course (ends 9/8)  Fever resolved   Other Stroke Risk Factors   hx of stroke - small remote appearing infarcts in the left frontal lobe by CT and MRI  Other Active Problems  Microcytic anemia - iron 38, Ferritin 115 - put on iron tab bid for 14 days.   Pt found on floor 04/23/19 by nursing staff. No obvious injury, complaints of pain, or neurologic changes.   Hospital day # 12   Marvel Plan, MD PhD Stroke Neurology 04/26/2019 1:51 PM    To contact Stroke Continuity provider, please refer to [email or web address]. After hours, contact General Neurology

## 2019-04-26 NOTE — Progress Notes (Signed)
Occupational Therapy Treatment Patient Details Name: Rodney SinclairSamuel Collier MRN: [id number] DOB: Apr 03, 1966 Today's Date: 04/26/2019    History of present illness Pt is a 53 y.o. M with no known PMH who presents with right sided weakness, decreased sensation on right, diplopia and headache. CT showing remote L frontal infarcts, CTA head/neck with L P2 occlusion, MRI with acute L PCA infarct including much of L thalamus, L > R cerebellum, R pons. Now s/p complete revascularization of occluded L PCA with mechanical thrombectomy. ETT 8/27-8/28; self extubated 8/28.   OT comments  Pt progressing towards established OT goals and continues to present with high motivation to participate in therapy. Issuing occlusion glasses with right eye nasal position blocked; pt reporting he decreased diplopia and demonstrating increased success with targeted reach. Will continue to assess vision. Pt demonstrating increased sitting balance to maintain upright posture with Min cues for corrections and close [employer] Guard A for safety. Pt performing stand pivot to recliner with Mod A +2 and right knee blocked. Continue to recommend dc to CIR and will continue to follow acutely as admitted.    Follow Up Recommendations  CIR    Equipment Recommendations  Other (comment)(Defer to next venue)    Recommendations for Other Services      Precautions / Restrictions Precautions Precautions: Fall Precaution Comments: R hemiparesis and inattention       Mobility Bed Mobility Overal bed mobility: Needs Assistance Bed Mobility: Rolling;Sidelying to Sit Rolling: Mod assist Sidelying to sit: Mod assist;HOB elevated;+2 for physical assistance       General bed mobility comments: Min assist to roll to his right with multimodal cues for intitiation and sequencing.  Most assist of R leg and hand over hand to find rail on the right to be able to pull.  Most physical assist required to come to sitting EOB.    Transfers Overall transfer  level: Needs assistance Equipment used: 2 person hand held assist Transfers: Sit to/from [employer]GI CorporationStand;Stand Pivot Transfers Sit to Stand: Mod assist;+2 physical assistance Stand pivot transfers: +2 physical assistance;Mod assist       General transfer comment: Two person mod assist to stand x 2 once for practice and once for peri care and transfer to the recliner chair.  cues and assist needed for upright posture, block R knee and weight shift to the left during standing.  Heavier mod assist needed for the transfer due to right leg instability in WB on the R.    Balance Overall balance assessment: Needs assistance Sitting-balance support: Feet supported;Single extremity supported;Bilateral upper extremity supported;No upper extremity supported Sitting balance-Leahy Scale: Fair Sitting balance - Comments: Pt could get to close supervision for several minutes with visual cues for midline, one time he fell over with no UE assist and attempting a dynamic scoot.  Postural control: Right lateral lean;Posterior lean Standing balance support: Single extremity supported Standing balance-Leahy Scale: Poor Standing balance comment: requires external support in standing for R LE block, weight shift left and cues for upright posture.                            ADL either performed or assessed with clinical judgement   ADL Overall ADL's : Needs assistance/impaired                         Toilet Transfer: Moderate assistance;+2 for physical assistance;+2 for safety/equipment;Stand-pivot(simulated to recliner) Toilet Transfer Details (indicate cue type and  reason): Mod A for balance and power up           General ADL Comments: Focuses session on vision deficits and occular occlusion; sitting balance; and transfers     Vision   Vision Assessment?: Yes Eye Alignment: Impaired (comment)(dysconjugate gaze) Ocular Range of Motion: Restricted on the right Tracking/Visual Pursuits:  Decreased smoothness of horizontal tracking;Decreased smoothness of vertical tracking;Requires cues, head turns, or add eye shifts to track;Unable to hold eye position out of midline;Decreased smoothness of eye movement to RIGHT superior field;Decreased smoothness of eye movement to RIGHT inferior field Visual Fields: Right visual field deficit;Other (comment)(Right neglect/inattention) Diplopia Assessment: Disappears with one eye closed;Objects split side to side;Present all the time/all directions Depth Perception: Undershoots Additional Comments: Providing pt with occlusion glasses placing tap on right lens at nasal portion. Pt reporting decreased diplopia with occulsion glasses. Noting right eye with inner nasal gaze while wearing glasses   Perception     Praxis      Cognition Arousal/Alertness: Awake/alert Behavior During Therapy: WFL for tasks assessed/performed(better affect, one smile, seemed sad appropriately) Overall Cognitive Status: Impaired/Different from baseline Area of Impairment: Attention;Memory;Following commands;Safety/judgement;Awareness;Problem solving                   Current Attention Level: Sustained Memory: Decreased recall of precautions Following Commands: Follows one step commands inconsistently;Follows one step commands with increased time Safety/Judgement: Decreased awareness of safety;Decreased awareness of deficits Awareness: Intellectual Problem Solving: Slow processing;Decreased initiation;Difficulty sequencing;Requires verbal cues;Requires tactile cues General Comments: Inconsistent command following, better, more appropriate affect today, mildly better attention to his right side.          Exercises     Shoulder Instructions       General Comments      Pertinent Vitals/ Pain       Pain Assessment: Faces Faces Pain Scale: No hurt Pain Intervention(s): Monitored during session  Home Living                                           Prior Functioning/Environment              Frequency  Min 2X/week        Progress Toward Goals  OT Goals(current goals can now be found in the care plan section)  Progress towards OT goals: Progressing toward goals  Acute Rehab OT Goals Patient Stated Goal: unable OT Goal Formulation: Patient unable to participate in goal setting Time For Goal Achievement: 05/04/19 Potential to Achieve Goals: Good ADL Goals Pt Will Perform Grooming: with min assist;sitting Pt Will Transfer to Toilet: with max assist;bedside commode;stand pivot transfer Additional ADL Goal #1: pt will complete bed mobility mod (A) as precursor to adls Additional ADL Goal #2: pt will complete 2 step command  Plan Discharge plan remains appropriate    Co-evaluation    PT/OT/SLP Co-Evaluation/Treatment: Yes Reason for Co-Treatment: Complexity of the patient's impairments (multi-system involvement);For patient/therapist safety;To address functional/ADL transfers PT goals addressed during session: Mobility/safety with mobility;Balance;Proper use of DME;Strengthening/ROM OT goals addressed during session: ADL's and self-care      AM-PAC OT "6 Clicks" Daily Activity     Outcome Measure   Help from another person eating meals?: A Lot Help from another person taking care of personal grooming?: A Lot Help from another person toileting, which includes using toliet, bedpan, or urinal?: A Lot Help from  another person bathing (including washing, rinsing, drying)?: Total Help from another person to put on and taking off regular upper body clothing?: A Lot Help from another person to put on and taking off regular lower body clothing?: Total 6 Click Score: 10    End of Session Equipment Utilized During Treatment: Gait belt  OT Visit Diagnosis: Unsteadiness on feet (R26.81);Hemiplegia and hemiparesis Hemiplegia - Right/Left: Right Hemiplegia - dominant/non-dominant: Dominant Hemiplegia - caused  by: Cerebral infarction   Activity Tolerance Patient tolerated treatment well   Patient Left in chair;with call bell/phone within reach;with chair alarm set(alarm belt)   Nurse Communication Mobility status        Time: 8889-1694 OT Time Calculation (min): 33 min  Charges: OT General Charges $OT Visit: 1 Visit OT Treatments $Self Care/Home Management : 8-22 mins  [place], OTR/L Acute Rehab Pager: [phone] Office: [place] 04/26/2019, 5:09 PM

## 2019-04-26 NOTE — Progress Notes (Signed)
  Speech Language Pathology Treatment: Dysphagia;Cognitive-Linquistic  Patient Details Name: Rodney Collier MRN: [id number] DOB: [birth year] Today's Date: 04/26/2019 Time: 0175-1025 SLP Time Calculation (min) (ACUTE ONLY): 16 min  Assessment / Plan / Recommendation Clinical Impression  Rodney Collier participatory in treatment for dysphagia and aphasia/apraxia. Residual right and mild left sided pocketed eggs were removed. Nectar thick orange juice consumed with min verbal cues for smaller volume swallowed without s/s aspiration. He is not ready for upgraded po trials due to continued oral residue and initial MBS 8 days ago. Per MBS report pt sensed aspiration/ejected during swallow. ST can give trials thin water at bedside to assist in determining safety to upgrade at bedside or repeat MBS (repeating MBS likely the safest recommendation). Continue Dys 2, nectar and CHECK for POCKETING on Pt'S RIGHT side after every meal.   He continues to demonstrate whispered during attempts at expressive communication and apraxic/phonemic errors (distrotions and substitutions). To facilitate onset of vocalization pt SLP provided motor resistance without success. Sang familiar song with approximations to accurate word 10-20% of the time. Could not activate articulatory placement given verbal/visual and tactile motor input with melodic intonation therapy. Needed tactile repetition and practice to establish head nods/shakes and was 50% accurate with simple biographical/environmental yes/no questions.    HPI HPI: Pt is a 53 y.o. M with no known PMH who presents with right sided weakness, decreased sensation on right, diplopia and headache. CT showing remote L frontal infarcts, CTA head/neck with L P2 occlusion, MRI with acute L PCA infarct including much of L thalamus, L > R cerebellum, R pons. Now s/p complete revascularization of occluded L PCA with mechanical thrombectomy. ETT 8/27-8/28; self extubated 8/28.      SLP  Plan  Continue with current plan of care       Recommendations  Diet recommendations: Nectar-thick liquid;Dysphagia 2 (fine chop) Liquids provided via: Cup Medication Administration: Crushed with puree Supervision: Patient able to self feed;Full supervision/cueing for compensatory strategies;Staff to assist with self feeding Compensations: Minimize environmental distractions;Small sips/bites;Slow rate;Lingual sweep for clearance of pocketing Postural Changes and/or Swallow Maneuvers: Seated upright 90 degrees                Oral Care Recommendations: Oral care BID(check for pocketing after meals) Follow up Recommendations: Skilled Nursing facility SLP Visit Diagnosis: Dysphagia, oropharyngeal phase (R13.12);Cognitive communication deficit (E52.778) Plan: Continue with current plan of care                       Houston Siren 04/26/2019, 11:16 AM  [name].Ed Risk analyst [phone] Office [phone]

## 2019-04-26 NOTE — Progress Notes (Signed)
04/26/2019 Pt is progressing with gait and mobility.  He was able to sit EOB with less assist today and respond to verbal and visual cues to find midline sitting balance.  We transferred him OOB to the recliner chair with two person assist and pt was taking pivotal steps.  I would like to progress to more standing/pre gait activities next session.  He remains an excellent CIR candidate.       04/26/19 1633  PT Visit Information  Last PT Received On 04/26/19  Assistance Needed +2  PT/OT/SLP Co-Evaluation/Treatment Yes  Reason for Co-Treatment Complexity of the patient's impairments (multi-system involvement);Necessary to address cognition/behavior during functional activity;For patient/therapist safety;To address functional/ADL transfers  PT goals addressed during session Mobility/safety with mobility;Balance;Proper use of DME;Strengthening/ROM  Subjective Data  Subjective Pt with difficulty communicating, expressive difficulties  Patient Stated Goal unable  Precautions  Precautions Fall  Precaution Comments R hemiparesis and inattention  Pain Assessment  Pain Assessment Faces  Faces Pain Scale 0  Cognition  Arousal/Alertness Awake/alert  Behavior During Therapy WFL for tasks assessed/performed (better affect, one smile, seemed sad appropriately)  Overall Cognitive Status Impaired/Different from baseline  Area of Impairment Attention;Memory;Following commands;Safety/judgement;Awareness;Problem solving  Current Attention Level Sustained  Memory Decreased recall of precautions  Following Commands Follows one step commands inconsistently;Follows one step commands with increased time  Safety/Judgement Decreased awareness of safety;Decreased awareness of deficits  Awareness Intellectual  Problem Solving Slow processing;Decreased initiation;Difficulty sequencing;Requires verbal cues;Requires tactile cues  General Comments Inconsistent command following, better, more appropriate affect today,  mildly better attention to his right side.    Difficult to assess due to Impaired communication  Bed Mobility  Overal bed mobility Needs Assistance  Bed Mobility Rolling;Sidelying to Sit  Rolling Mod assist  Sidelying to sit Mod assist;HOB elevated;+2 for physical assistance  General bed mobility comments Min assist to roll to his right with multimodal cues for intitiation and sequencing.  Most assist of R leg and hand over hand to find rail on the right to be able to pull.  Most physical assist required to come to sitting EOB.    Transfers  Overall transfer level Needs assistance  Equipment used 2 person hand held assist  Transfers Sit to/from [employer] Transfers  Sit to Stand Mod assist;+2 physical assistance  Stand pivot transfers +2 physical assistance;Mod assist  General transfer comment Two person mod assist to stand x 2 once for practice and once for peri care and transfer to the recliner chair.  cues and assist needed for upright posture, block R knee and weight shift to the left during standing.  Heavier mod assist needed for the transfer due to right leg instability in WB on the R.  Modified Rankin (Stroke Patients Only)  Pre-Morbid Rankin Score 0  Modified Rankin 5  Balance  Overall balance assessment Needs assistance  Sitting-balance support Feet supported;Single extremity supported;Bilateral upper extremity supported;No upper extremity supported  Sitting balance-Leahy Scale Fair  Sitting balance - Comments Pt could get to close supervision for several minutes with visual cues for midline, one time he fell over with no UE assist and attempting a dynamic scoot.   Postural control Right lateral lean;Posterior lean  Standing balance support Single extremity supported  Standing balance-Leahy Scale Poor  Standing balance comment requires external support in standing for R LE block, weight shift left and cues for upright posture.   PT - End of Session  Equipment Utilized  During Treatment Gait belt  Activity Tolerance Patient tolerated treatment  well  Patient left in chair;with call bell/phone within reach;with chair alarm set   PT - Assessment/Plan  PT Plan Current plan remains appropriate  PT Visit Diagnosis Other abnormalities of gait and mobility (R26.89);Other symptoms and signs involving the nervous system (R29.898)  Hemiplegia - Right/Left Right  Hemiplegia - dominant/non-dominant Dominant  Hemiplegia - caused by Cerebral infarction  PT Frequency (ACUTE ONLY) Min 4X/week  Follow Up Recommendations CIR;Supervision/Assistance - 24 hour  PT equipment Wheelchair (measurements PT);Wheelchair cushion (measurements PT);3in1 (PT);Hospital bed  AM-PAC PT "6 Clicks" Mobility Outcome Measure (Version 2)  Help needed turning from your back to your side while in a flat bed without using bedrails? 2  Help needed moving from lying on your back to sitting on the side of a flat bed without using bedrails? 2  Help needed moving to and from a bed to a chair (including a wheelchair)? 2  Help needed standing up from a chair using your arms (e.g., wheelchair or bedside chair)? 2  Help needed to walk in hospital room? 1  Help needed climbing 3-5 steps with a railing?  1  6 Click Score 10  Consider Recommendation of Discharge To: CIR/SNF/LTACH  PT Goal Progression  Progress towards PT goals Progressing toward goals  PT Time Calculation  PT Start Time (ACUTE ONLY) 1518  PT Stop Time (ACUTE ONLY) 1553  PT Time Calculation (min) (ACUTE ONLY) 35 min  PT General Charges  $$ ACUTE PT VISIT 1 Visit  PT Treatments  $Therapeutic Activity 8-22 mins  Marrian Bells B. Zayan Delvecchio, PT, DPT  Acute Rehabilitation [phone] pager [phone] office  @ [facility] [place]: [phone]

## 2019-04-27 LAB — CBC
HCT: 38.5 % — ABNORMAL LOW (ref 39.0–52.0)
Hemoglobin: 12 g/dL — ABNORMAL LOW (ref 13.0–17.0)
MCH: 22.9 pg — ABNORMAL LOW (ref 26.0–34.0)
MCHC: 31.2 g/dL (ref 30.0–36.0)
MCV: 73.3 fL — ABNORMAL LOW (ref 80.0–100.0)
Platelets: 478 10*3/uL — ABNORMAL HIGH (ref 150–400)
RBC: 5.25 MIL/uL (ref 4.22–5.81)
RDW: 14 % (ref 11.5–15.5)
WBC: 7.2 10*3/uL (ref 4.0–10.5)
nRBC: 0 % (ref 0.0–0.2)

## 2019-04-27 LAB — GLUCOSE, CAPILLARY
Glucose-Capillary: 140 mg/dL — ABNORMAL HIGH (ref 70–99)
Glucose-Capillary: 173 mg/dL — ABNORMAL HIGH (ref 70–99)
Glucose-Capillary: 112 mg/dL — ABNORMAL HIGH (ref 70–99)
Glucose-Capillary: 121 mg/dL — ABNORMAL HIGH (ref 70–99)

## 2019-04-27 LAB — BASIC METABOLIC PANEL
Anion gap: 10 (ref 5–15)
BUN: 11 mg/dL (ref 6–20)
CO2: 24 mmol/L (ref 22–32)
Calcium: 9.5 mg/dL (ref 8.9–10.3)
Chloride: 103 mmol/L (ref 98–111)
Creatinine, Ser: 0.83 mg/dL (ref 0.61–1.24)
GFR calc Af Amer: >60 mL/min (ref >60)
GFR calc non Af Amer: >60 mL/min (ref >60)
Glucose, Bld: 166 mg/dL — ABNORMAL HIGH (ref 70–99)
Potassium: 4.3 mmol/L (ref 3.5–5.1)
Sodium: 137 mmol/L (ref 135–145)

## 2019-04-27 MED ORDER — LIVING WELL WITH DIABETES BOOK
Freq: Once | Status: AC
  Administered 2019-04-27: 12:00:00
  Filled 2019-04-27: qty 1

## 2019-04-27 NOTE — Progress Notes (Signed)
STROKE TEAM PROGRESS NOTE   INTERVAL HISTORY Pt working with PT and sat in chair. Still has anarthria. No neuro changes. Pending CIR/SNF.   Vitals:   04/26/19 2322 04/27/19 0320 04/27/19 0830 04/27/19 1118  BP: (!) 141/67 140/61 129/81 (!) 141/88  Pulse: 76 80 82 87  Resp: 18 18 16 16   Temp: 98.1 F (36.7 C) 98 F (36.7 C) 98.3 F (36.8 C) 98.9 F (37.2 C)  TempSrc: Oral Oral Oral Oral  SpO2: 100% 100% 99% 100%  Weight:      Height:        CBC:  Recent Labs  Lab 04/26/19 0316 04/27/19 0309  WBC 7.3 7.2  HGB 12.1* 12.0*  HCT 38.6* 38.5*  MCV 72.4* 73.3*  PLT 417* 478*    Basic Metabolic Panel:  Recent Labs  Lab 04/26/19 0316 04/27/19 0309  NA 138 137  K 4.3 4.3  CL 103 103  CO2 23 24  GLUCOSE 147* 166*  BUN 10 11  CREATININE 0.74 0.83  CALCIUM 9.4 9.5    IMAGING  CT Head WO Contrast 04/16/2019 Areas of acute infarction are showing low-density and swelling but no macroscopic hemorrhage by CT. As shown by previous MRI, there is involvement of the cerebellum, left cerebral peduncle and thalamus and left posteromedial temporal lobe and occipital lobe. Tiny focus of infarction in the right occipital lobe as well.  [name] 04/15/2019 1. Acute infarction throughout the left PCA territory (including most of the left thalamus), left more than right cerebellum, and right pons. Petechial hemorrhage is seen at the level of the pons.  2. Prior left frontal infarcts and microvascular ischemic gliosis.   Cerebral angiogram 04/14/2019 4 vessel cerebral arteriogram followed by complete revascularization of occluded Lt PCA  With x 2 passes with 40mm x 4mm embotrap retriever  device achieving a TICI 3 revascularization.  Ct Code Stroke Cta Head W/wo Contrast Ct Code Stroke Cta Neck W/wo Contrast Ct Code Stroke Cta Cerebral Perfusion W/wo Contrast 04/14/2019 1. Left P2 occlusion with downstream reconstitution. Qualitative assessment of CT perfusion maps shows  ischemia without visible core infarct in the left occipital lobe.  2. Mild atherosclerosis in the neck without flow limiting stenosis or embolic source seen.  3. No large vessel occlusion in the anterior circulation.  4. Mild mid basilar narrowing.   Ct Head Code Stroke Wo Contrast 04/14/2019 1. No acute finding.  2. Small remote appearing infarcts in the left frontal lobe.    LE Doppler 04/15/2019 Right: There is no evidence of deep vein thrombosis in the lower extremity. No cystic structure found in the popliteal fossa. Left: There is no evidence of deep vein thrombosis in the lower extremity. No cystic structure found in the popliteal fossa.  2D Echocardiogram 04/15/2019  1. The left ventricle has normal systolic function, with an ejection fraction of 55-60%. The cavity size was normal. Left ventricular diastolic parameters were normal. No evidence of left ventricular regional wall motion abnormalities.  2. The right ventricle has normal systolic function. The cavity was normal. There is no increase in right ventricular wall thickness.  3. The mitral valve is abnormal. Mild thickening of the mitral valve leaflet. The MR jet is posteriorly-directed.  4. The tricuspid valve is grossly normal.  5. The aortic valve is tricuspid. No stenosis of the aortic valve.  6. The aorta is normal unless otherwise noted.  7. The inferior vena cava was normal in size with <50% respiratory variability. SUMMARY LVEF  55-60%, normal wall thickness, normal wall motion, normal diastolic function, mild posteriorly directed MR, normal size IVC that does not collapse, no PFO by color doppler  FINDINGS  Left Ventricle: The left ventricle has normal systolic function, with an ejection fraction of 55-60%. The cavity size was normal. There is no increase in left ventricular wall thickness. Left ventricular diastolic parameters were normal. Normal left ventricular filling pressures No evidence of left ventricular  regional wall motion abnormalities..  EEG  04/15/2019 This study is suggestive of cortical dysfunction in the left hemisphere secondary to underlying stroke as well as mild diffuse encephalopathy. No seizures or definite epileptiform discharges were seen throughout the recording.  TCD w/ bubble 04/18/2019  No HTIS heard during rest. No HITS heard during valsalva.  TEE 04/19/2019 LVEF 55-60% No LA/LAA thrombus or mass No ASD or PFO by color flow Doppler or saline microcavitation study. Mild AR, mild MR.   PHYSICAL EXAM       General - Well nourished, well developed, African gentleman who is not in distress  Ophthalmologic - fundi not visualized due to noncooperation.  Cardiovascular - Regular rate and rhythm.  Neuro - awake, alert, eyes open, following most of the simple commands. Anarthria.  Disconjugate eyes, left eye CN III palsy with pupil sparing, right eye CN VI palsy, PERRL. Blinking to visual threat bilaterally. Right facial droop, tongue protrusion not cooperative, against gravity of LUE but significant ataxia, able to follow simple commands with left hand. lifts left leg slightly off of the bed on command, follow simple commands of the left foot. On pain stimulation, slight withdraw of RLE but no movement of RUE or RLE. DTR 1+ and no babinski. Sensation and gait not tested.   ASSESSMENT/PLAN Mr. Tyre Coady is a 53 y.o. male with no significant past medical history presenting with right-sided weakness, decreased sensation on the right, diplopia and headache. Complete revascularization of occluded L PCA w/ mechanical thrombectomy.  Stroke: embolic multifocal posterior circulation infarcts with left P2 occlusion s/p IR with TICI3 reperfusion - likely embolic secondary to hypercoagulability secondary to metabolic syndrome  Code Stroke CT head No acute abnormality. Old infarct L frontal lobe.   CTA head & neck L P2 occlusion. Mild neck atherosclerosis . No LVO anterior  circulation. Mild mid BA narrowing.  CT perfusion ischemia w/o visible core  CT Head WO Contrast - 04/16/19 - Areas of acute infarction are showing low-density and swelling but no macroscopic hemorrhage by CT.  As shown by previous MRI, there is involvement of the cerebellum, left cerebral peduncle and thalamus and left posteromedial temporal lobe and occipital lobe. Tiny focus of infarction in the right occipital lobe as well.  Cerebral angio TICI reperfusion of occluded L PCA  MRI  L PCA (including thalamus), L>R cerebellum, R pontine and left midbrain infarct. Petechial hemorrhage seen in pons. Old L frontal infarcts.   LE Doppler no DVT - no evidence of deep vein thrombosis   2D Echo - EF 55-60%.  No cardiac source of emboli identified.   TCD bubble no HITS  TEE neg  UDS - pos for benzos - UDS done on 9/1  EEG - cortical dysfunction but no seizures.  LDL 88  HgbA1c 12.3  Lovenox 40 mg sq daily  for VTE prophylaxis  No antithrombotic prior to admission, now on aspirin 81 mg daily and clopidogrel 75 mg daily x 3 weeks, then aspirin alone. (Plavix started 04/21/19 - last dose 05/12/19)  Therapy recommendations:  CIR  Disposition:  pending  - rehab following for possible admission. Concern w/ post hospitalization family support. May need SNF - discussed w/ rehab - dispo remains pending.   Possible seizure  Full body rigid shaking hours post IR, L>R  Loaded with keppra  EEG - cortical dysfunction but no seizures.  Continue keppra 500mg  bid  Hypertension  No hx of HTN, on no home meds  Placed On cleviprex gtt for BP control post IR, now off . On amlodipine 10  . BP 120-140s . Long-term BP goal normotensive  Hyperlipidemia  Home meds:  No statin  LDL 88, goal < 70  HDL 39  Add lipitor 20  Continue statin at discharge  Diabetes type II, new diagnosis, Uncontrolled Hyperglycemia, improving  HgbA1c 12.3, goal < 7.0  CBGs  SSI   Put on metformin 850  bid  DM coordinator consulted - on levemir 5U bid.  Likely hypercoagulable state associated with uncontrolled DM  Dysphagia . Secondary to stroke . Speech on board . cleared for Dysphagia 3 nectar thick liquids with MBS-> D2 nectar thick . Encourage po intake . Continue IVF @ 40  UTI with fever  UA showed WBC > 50  Urine culture enterobactor and staph coag neg  Sensitive to cipro  finished cipro 500mg  bid course (ends 9/8)  Fever resolved   Other Stroke Risk Factors   hx of stroke - small remote appearing infarcts in the left frontal lobe by CT and MRI  Other Active Problems  Microcytic anemia - iron 38, Ferritin 115 - put on iron tab bid for 14 days.   Pt found on floor 04/23/19 by nursing staff. No obvious injury, complaints of pain, or neurologic changes.   Hospital day # 13  Marvel PlanJindong Williams Dietrick, MD PhD Stroke Neurology 04/27/2019 2:22 PM    To contact Stroke Continuity provider, please refer to [email or web address]. After hours, contact General Neurology

## 2019-04-27 NOTE — Progress Notes (Signed)
Inpatient Rehab Admissions Coordinator:   Spoke with pt's daughter on the phone.  She has been trying to apply for Medicaid in [place], but unsuccessful so far. Also has been trying to reach pt's brother, also unsuccessful.  Discussed that, based on current level of function, he would most definitely need 24/7 supervision/care at discharge from our program and it would be beneficial to start looking at SNF options for longer term rehab unless brother was available.  Daughter in agreement, and I let CM know.   Shann Medal, PT, DPT Admissions Coordinator [phone] 04/27/19  4:15 PM

## 2019-04-27 NOTE — Progress Notes (Signed)
Nutrition Follow-up  DOCUMENTATION CODES:   Not applicable  INTERVENTION:  Continue Glucerna Shake po TID (thickened to nectar thick consistency), each supplement provides 220 kcal and 10 grams of protein.  Encourage adequate PO intake.   NUTRITION DIAGNOSIS:   Inadequate oral intake related to inability to eat as evidenced by NPO status; diet advanced; improved  GOAL:   Patient will meet greater than or equal to 90% of their needs; met  MONITOR:   PO intake, Supplement acceptance, Diet advancement, Skin, Weight trends, Labs, I & O's  REASON FOR ASSESSMENT:   Ventilator    ASSESSMENT:   [name] with no significant past medical history presenting with right-sided weakness, decreased sensation on the right, diplopia and headache. Complete revascularization of occluded L PCA w/ mechanical thrombectomy. Self extubated 8/28. TEE 9/1 which showed no PFO/clot or cardiac source of aneurysm.   Pt is currently on a dysphagia 2 diet with nectar thick liquids. Meal completion has been 50-100%. Pt currently has Glucerna shake ordered and has been consuming them. RD to continue with current orders to aid in caloric and protein needs.    Labs and medications reviewed.   Diet Order:   Diet Order            DIET DYS 2 Room service appropriate? No; Fluid consistency: Nectar Thick  Diet effective now              EDUCATION NEEDS:   Not appropriate for education at this time  Skin:  Skin Assessment: Reviewed RN Assessment Skin Integrity Issues:: Other (Comment) Other: lt thigh puncture wound  Last BM:  9/7  Height:   Ht Readings from Last 1 Encounters:  04/19/19 6' (1.829 m)    Weight:   Wt Readings from Last 1 Encounters:  04/19/19 77 kg    Ideal Body Weight:  80.9 kg  BMI:  Body mass index is 23.02 kg/m.  Estimated Nutritional Needs:   Kcal:  2000-2200  Protein:  100-115 grams  Fluid:  > 2.0 L    Rodney Parker, MS, RD,  LDN Pager # [phone] After hours/ weekend pager # [phone]

## 2019-04-27 NOTE — Progress Notes (Signed)
Physical Therapy Treatment Patient Details Name: Rodney Collier MRN: [id number] DOB: 04/30/1966 Today's Date: 04/27/2019    History of Present Illness Pt is a 53 y.o. M with no known PMH who presents with right sided weakness, decreased sensation on right, diplopia and headache. CT showing remote L frontal infarcts, CTA head/neck with L P2 occlusion, MRI with acute L PCA infarct including much of L thalamus, L > R cerebellum, R pons. Now s/p complete revascularization of occluded L PCA with mechanical thrombectomy. ETT 8/27-8/28; self extubated 8/28.    PT Comments    Patient seen for mobility progression. Pt requires mod/max A for bed mobility and functional transfer training. Continue to recommend CIR level therapy.    Follow Up Recommendations  CIR;Supervision/Assistance - 24 hour     Equipment Recommendations  Wheelchair (measurements PT);Wheelchair cushion (measurements PT);3in1 (PT);Hospital bed    Recommendations for Other Services       Precautions / Restrictions Precautions Precautions: Fall Precaution Comments: R hemiparesis and inattention Restrictions Weight Bearing Restrictions: No    Mobility  Bed Mobility Overal bed mobility: Needs Assistance Bed Mobility: Supine to Sit Rolling: Mod assist Sidelying to sit: Mod assist;HOB elevated       General bed mobility comments: assist to bring R LE to EOB and to elevate trunk into sitting; cues for sequencing   Transfers Overall transfer level: Needs assistance Equipment used: 1 person hand held assist Transfers: Sit to/from [employer] Transfers Sit to Stand: Mod assist Stand pivot transfers: Max assist       General transfer comment: cues for safety as pt continues to be impulsive to move once in sitting EOB and for sequencing; assistance for balance and blocking R knee   Ambulation/Gait                 Stairs             Wheelchair Mobility    Modified Rankin (Stroke Patients  Only) Modified Rankin (Stroke Patients Only) Pre-Morbid Rankin Score: No symptoms Modified Rankin: Severe disability     Balance Overall balance assessment: Needs assistance Sitting-balance support: Feet supported;Single extremity supported;Bilateral upper extremity supported Sitting balance-Leahy Scale: Poor   Postural control: Right lateral lean;Posterior lean Standing balance support: Single extremity supported Standing balance-Leahy Scale: Poor Standing balance comment: requires external support in standing for R LE block, weight shift left and cues for upright posture.                             Cognition Arousal/Alertness: Awake/alert Behavior During Therapy: WFL for tasks assessed/performed Overall Cognitive Status: Impaired/Different from baseline Area of Impairment: Attention;Memory;Following commands;Safety/judgement;Awareness;Problem solving                   Current Attention Level: Sustained Memory: Decreased recall of precautions Following Commands: Follows one step commands inconsistently;Follows one step commands with increased time Safety/Judgement: Decreased awareness of safety;Decreased awareness of deficits Awareness: Intellectual Problem Solving: Slow processing;Decreased initiation;Difficulty sequencing;Requires verbal cues;Requires tactile cues General Comments: pt seems very down today and once in chair closed eyes and holding head appearing to be frustrated with situation      Exercises      General Comments        Pertinent Vitals/Pain Pain Assessment: Faces Faces Pain Scale: Hurts a little bit Pain Location: pt pointed to R LE when asked about pain Pain Intervention(s): Monitored during session;Repositioned    Home Living  Prior Function            PT Goals (current goals can now be found in the care plan section) Acute Rehab PT Goals Patient Stated Goal: unable Progress towards PT goals:  Progressing toward goals    Frequency    Min 4X/week      PT Plan Current plan remains appropriate    Co-evaluation              AM-PAC PT "6 Clicks" Mobility   Outcome Measure  Help needed turning from your back to your side while in a flat bed without using bedrails?: A Lot Help needed moving from lying on your back to sitting on the side of a flat bed without using bedrails?: A Lot Help needed moving to and from a bed to a chair (including a wheelchair)?: A Lot Help needed standing up from a chair using your arms (e.g., wheelchair or bedside chair)?: A Lot Help needed to walk in hospital room?: Total Help needed climbing 3-5 steps with a railing? : Total 6 Click Score: 10    End of Session Equipment Utilized During Treatment: Gait belt Activity Tolerance: Patient tolerated treatment well Patient left: in chair;with call bell/phone within reach;with chair alarm set Nurse Communication: Mobility status PT Visit Diagnosis: Other abnormalities of gait and mobility (R26.89);Other symptoms and signs involving the nervous system (R29.898) Hemiplegia - Right/Left: Right Hemiplegia - dominant/non-dominant: Dominant Hemiplegia - caused by: Cerebral infarction     Time: 1130-1155 PT Time Calculation (min) (ACUTE ONLY): 25 min  Charges:  $Gait Training: 23-37 mins                     Erline LevineKellyn Meli Faley, PTA Acute Rehabilitation Services Pager: [phone] Office: [phone]     Carolynne EdouardKellyn R Stevee Valenta 04/27/2019, 2:26 PM

## 2019-04-28 DIAGNOSIS — R471 Dysarthria and anarthria: Secondary | ICD-10-CM

## 2019-04-28 LAB — CBC
HCT: 38.2 % — ABNORMAL LOW (ref 39.0–52.0)
Hemoglobin: 12.1 g/dL — ABNORMAL LOW (ref 13.0–17.0)
MCH: 23 pg — ABNORMAL LOW (ref 26.0–34.0)
MCHC: 31.7 g/dL (ref 30.0–36.0)
MCV: 72.8 fL — ABNORMAL LOW (ref 80.0–100.0)
Platelets: 477 10*3/uL — ABNORMAL HIGH (ref 150–400)
RBC: 5.25 MIL/uL (ref 4.22–5.81)
RDW: 13.9 % (ref 11.5–15.5)
WBC: 6.3 10*3/uL (ref 4.0–10.5)
nRBC: 0 % (ref 0.0–0.2)

## 2019-04-28 LAB — GLUCOSE, CAPILLARY
Glucose-Capillary: 124 mg/dL — ABNORMAL HIGH (ref 70–99)
Glucose-Capillary: 162 mg/dL — ABNORMAL HIGH (ref 70–99)
Glucose-Capillary: 139 mg/dL — ABNORMAL HIGH (ref 70–99)
Glucose-Capillary: 108 mg/dL — ABNORMAL HIGH (ref 70–99)

## 2019-04-28 LAB — BASIC METABOLIC PANEL
Anion gap: 10 (ref 5–15)
BUN: 13 mg/dL (ref 6–20)
CO2: 25 mmol/L (ref 22–32)
Calcium: 9.3 mg/dL (ref 8.9–10.3)
Chloride: 101 mmol/L (ref 98–111)
Creatinine, Ser: 0.83 mg/dL (ref 0.61–1.24)
GFR calc Af Amer: >60 mL/min (ref >60)
GFR calc non Af Amer: >60 mL/min (ref >60)
Glucose, Bld: 124 mg/dL — ABNORMAL HIGH (ref 70–99)
Potassium: 3.9 mmol/L (ref 3.5–5.1)
Sodium: 136 mmol/L (ref 135–145)

## 2019-04-28 MED ORDER — GLIPIZIDE 5 MG PO TABS
5.0000 mg | ORAL_TABLET | Freq: Two times a day (BID) | ORAL | Status: DC
  Administered 2019-04-28 – 2019-05-04 (×12): 5 mg via ORAL
  Filled 2019-04-28 (×12): qty 1

## 2019-04-28 NOTE — TOC Initial Note (Signed)
Transition of Care [facility]) - Initial/Assessment Note    Patient Details  Name: Rodney Collier MRN: [id number] Date of Birth: 1965/12/03  Transition of Care [facility]) CM/SW Contact:    Geralynn Ochs, LCSW Phone Number: 04/28/2019, 3:43 PM  Clinical Narrative:  CSW alerted by rehab admissions that patient is not appropriate for CIR due to no family support, will likely need SNF. Barriers to placement include the patient not being a [place] resident, patient is here from [place]. Patient will need to apply for Medicaid in [place], unsure if the hospital financial counselors can assist in any way. CSW called financial counselor assigned and left a voicemail, awaiting a call back.  Other barriers will include whether the patient's family can assist with finding a SNF in [place], whether the patient can transport, and how the patient will transport to [place]. CSW to follow.              Expected Discharge Plan: Skilled Nursing Facility Barriers to Discharge: Continued Medical Work up, Inadequate or no insurance   Patient Goals and CMS Choice Patient states their goals for this hospitalization and ongoing recovery are:: patient unable to state goals at this time      Expected Discharge Plan and Services Expected Discharge Plan: [place] Acute Care Choice: [place]                                        Prior Living Arrangements/Services   Lives with:: Self Patient language and need for interpreter reviewed:: No Do you feel safe going back to the place where you live?: Yes      Need for Family Participation in Patient Care: Yes (Comment) Care giver support system in place?: No (comment)   Criminal Activity/Legal Involvement Pertinent to Current Situation/Hospitalization: No - Comment as needed  Activities of Daily Living      Permission Sought/Granted Permission sought to share information with : Facility Sport and exercise psychologist, Family  Supports    Share Information with NAME: Samalie  Permission granted to share info w AGENCY: SNF  Permission granted to share info w Relationship: Daughter     Emotional Assessment   Attitude/Demeanor/Rapport: Unable to Assess Affect (typically observed): Unable to Assess Orientation: : Oriented to Self Alcohol / Substance Use: Not Applicable Psych Involvement: No (comment)  Admission diagnosis:  Aphasia [R47.01] Hyperglycemia [R73.9] Stroke (cerebrum) ([place]) [I63.9] Acute ischemic stroke [facility]) [I63.9] Patient Active Problem List   Diagnosis Date Noted  . History of ETT   . Acute ischemic stroke ([place]) 04/14/2019  . Embolism of posterior cerebral artery, left 04/14/2019   PCP:  Patient, No Pcp Per Pharmacy:   [facility] [place], [place] [address] [place] [address] Phone: [phone] Fax: [phone]     Social Determinants of Health ([place]) Interventions    Readmission Risk Interventions No flowsheet data found.

## 2019-04-28 NOTE — Progress Notes (Signed)
STROKE TEAM PROGRESS NOTE   INTERVAL HISTORY Pt sitting in bed. Eyes open, still anarthria, no neuro change. Pending SNF.    Vitals:   04/27/19 2314 04/28/19 0331 04/28/19 0737 04/28/19 1122  BP: 136/89 (!) 144/67 (!) 126/91 131/84  Pulse: 81 83 73 82  Resp: 18 18 16 16   Temp: 98.3 F (36.8 C) 98.3 F (36.8 C) 98.1 F (36.7 C) 98.3 F (36.8 C)  TempSrc: Oral Oral Oral Oral  SpO2: 99% 99% 99% 99%  Weight:      Height:        CBC:  Recent Labs  Lab 04/27/19 0309 04/28/19 0326  WBC 7.2 6.3  HGB 12.0* 12.1*  HCT 38.5* 38.2*  MCV 73.3* 72.8*  PLT 478* 477*    Basic Metabolic Panel:  Recent Labs  Lab 04/27/19 0309 04/28/19 0326  NA 137 136  K 4.3 3.9  CL 103 101  CO2 24 25  GLUCOSE 166* 124*  BUN 11 13  CREATININE 0.83 0.83  CALCIUM 9.5 9.3    IMAGING  CT Head WO Contrast 04/16/2019 Areas of acute infarction are showing low-density and swelling but no macroscopic hemorrhage by CT. As shown by previous MRI, there is involvement of the cerebellum, left cerebral peduncle and thalamus and left posteromedial temporal lobe and occipital lobe. Tiny focus of infarction in the right occipital lobe as well.  [name] 04/15/2019 1. Acute infarction throughout the left PCA territory (including most of the left thalamus), left more than right cerebellum, and right pons. Petechial hemorrhage is seen at the level of the pons.  2. Prior left frontal infarcts and microvascular ischemic gliosis.   Cerebral angiogram 04/14/2019 4 vessel cerebral arteriogram followed by complete revascularization of occluded Lt PCA  With x 2 passes with 5mm x 33mm embotrap retriever  device achieving a TICI 3 revascularization.  Ct Code Stroke Cta Head W/wo Contrast Ct Code Stroke Cta Neck W/wo Contrast Ct Code Stroke Cta Cerebral Perfusion W/wo Contrast 04/14/2019 1. Left P2 occlusion with downstream reconstitution. Qualitative assessment of CT perfusion maps shows ischemia without  visible core infarct in the left occipital lobe.  2. Mild atherosclerosis in the neck without flow limiting stenosis or embolic source seen.  3. No large vessel occlusion in the anterior circulation.  4. Mild mid basilar narrowing.   Ct Head Code Stroke Wo Contrast 04/14/2019 1. No acute finding.  2. Small remote appearing infarcts in the left frontal lobe.    LE Doppler 04/15/2019 Right: There is no evidence of deep vein thrombosis in the lower extremity. No cystic structure found in the popliteal fossa. Left: There is no evidence of deep vein thrombosis in the lower extremity. No cystic structure found in the popliteal fossa.  2D Echocardiogram 04/15/2019  1. The left ventricle has normal systolic function, with an ejection fraction of 55-60%. The cavity size was normal. Left ventricular diastolic parameters were normal. No evidence of left ventricular regional wall motion abnormalities.  2. The right ventricle has normal systolic function. The cavity was normal. There is no increase in right ventricular wall thickness.  3. The mitral valve is abnormal. Mild thickening of the mitral valve leaflet. The MR jet is posteriorly-directed.  4. The tricuspid valve is grossly normal.  5. The aortic valve is tricuspid. No stenosis of the aortic valve.  6. The aorta is normal unless otherwise noted.  7. The inferior vena cava was normal in size with <50% respiratory variability. SUMMARY LVEF 55-60%, normal  wall thickness, normal wall motion, normal diastolic function, mild posteriorly directed MR, normal size IVC that does not collapse, no PFO by color doppler  FINDINGS  Left Ventricle: The left ventricle has normal systolic function, with an ejection fraction of 55-60%. The cavity size was normal. There is no increase in left ventricular wall thickness. Left ventricular diastolic parameters were normal. Normal left ventricular filling pressures No evidence of left ventricular regional wall motion  abnormalities..  EEG  04/15/2019 This study is suggestive of cortical dysfunction in the left hemisphere secondary to underlying stroke as well as mild diffuse encephalopathy. No seizures or definite epileptiform discharges were seen throughout the recording.  TCD w/ bubble 04/18/2019  No HTIS heard during rest. No HITS heard during valsalva.  TEE 04/19/2019 LVEF 55-60% No LA/LAA thrombus or mass No ASD or PFO by color flow Doppler or saline microcavitation study. Mild AR, mild MR.   PHYSICAL EXAM   General - Well nourished, well developed, African gentleman who is not in distress  Ophthalmologic - fundi not visualized due to noncooperation.  Cardiovascular - Regular rate and rhythm.  Neuro - awake, alert, eyes open, following most of the simple commands. Anarthria.  Disconjugate eyes, left eye CN III palsy with pupil sparing, right eye CN VI palsy, PERRL. Blinking to visual threat bilaterally. Right facial droop, tongue protrusion not cooperative, against gravity of LUE but significant ataxia, able to follow simple commands with left hand. lifts left leg slightly off of the bed on command, follow simple commands of the left foot. On pain stimulation, slight withdraw of RLE but no movement of RUE or RLE. DTR 1+ and no babinski. Sensation and gait not tested.   ASSESSMENT/PLAN [name] with no significant past medical history presenting with right-sided weakness, decreased sensation on the right, diplopia and headache. Complete revascularization of occluded L PCA w/ mechanical thrombectomy.  Stroke: embolic multifocal posterior circulation infarcts with left P2 occlusion s/p IR with TICI3 reperfusion - likely embolic secondary to hypercoagulability secondary to metabolic syndrome  Code Stroke CT head No acute abnormality. Old infarct L frontal lobe.   CTA head & neck L P2 occlusion. Mild neck atherosclerosis . No LVO anterior circulation. Mild mid BA  narrowing.  CT perfusion ischemia w/o visible core  CT Head WO Contrast - 04/16/19 - Areas of acute infarction are showing low-density and swelling but no macroscopic hemorrhage by CT.  As shown by previous MRI, there is involvement of the cerebellum, left cerebral peduncle and thalamus and left posteromedial temporal lobe and occipital lobe. Tiny focus of infarction in the right occipital lobe as well.  Cerebral angio TICI reperfusion of occluded L PCA  MRI  L PCA (including thalamus), L>R cerebellum, R pontine and left midbrain infarct. Petechial hemorrhage seen in pons. Old L frontal infarcts.   LE Doppler no DVT - no evidence of deep vein thrombosis   2D Echo - EF 55-60%.  No cardiac source of emboli identified.   TCD bubble no HITS  TEE neg  UDS - pos for benzos - UDS done on 9/1  EEG - cortical dysfunction but no seizures.  LDL 88  HgbA1c 12.3  Lovenox 40 mg sq daily  for VTE prophylaxis  No antithrombotic prior to admission, now on aspirin 81 mg daily and clopidogrel 75 mg daily x 3 weeks, then aspirin alone. (Plavix started 04/21/19 - last dose 05/12/19)  Therapy recommendations:  CIR -> D/t lack of post hospitalization family  support - will pursue SNF  Medically ready for d/c when bed found  Disposition:  pending    Possible seizure  Full body rigid shaking hours post IR, L>R  Loaded with keppra  EEG - cortical dysfunction but no seizures.  Continue keppra 500mg  bid  Hypertension  No hx of HTN, on no home meds  Placed On cleviprex gtt for BP control post IR, now off . On amlodipine 10  . BP 120-140s . Long-term BP goal normotensive  Hyperlipidemia  Home meds:  No statin  LDL 88, goal < 70  HDL 39  Add lipitor 20  Continue statin at discharge  Diabetes type II, new diagnosis, Uncontrolled Hyperglycemia, improving  HgbA1c 12.3, goal < 7.0  CBGs  SSI   Put on metformin 850 bid  DM coordinator consulted - had placed on levemir 5U bid -  now recommends Metformin 850 mg bid and Glipizide 5 mg bid outpatient for glucose control. Will make adjustment prior to d/c.  Likely hypercoagulable state associated with uncontrolled DM  Dysphagia . Secondary to stroke . Speech on board . cleared for Dysphagia 3 nectar thick liquids with MBS-> D2 nectar thick . Encourage po intake . Continue IVF @ 35  UTI with fever  UA showed WBC > 50  Urine culture enterobactor and staph coag neg  Sensitive to cipro  finished cipro 500mg  bid course (ends 9/8)  Fever resolved   Other Stroke Risk Factors   hx of stroke - small remote appearing infarcts in the left frontal lobe by CT and MRI  Other Active Problems  Microcytic anemia - iron 38, Ferritin 115 - put on iron tab bid for 14 days.   Pt found on floor 04/23/19 by nursing staff. No obvious injury, complaints of pain, or neurologic changes.   Hospital day # 14  Rosalin Hawking, MD PhD Stroke Neurology 04/28/2019 2:05 PM    To contact Stroke Continuity provider, please refer to [email or web address] After hours, contact General Neurology

## 2019-04-28 NOTE — Progress Notes (Signed)
Physical Therapy Treatment Patient Details Name: Rodney Collier MRN: [id number] DOB: March 19, 1966 Today's Date: 04/28/2019    History of Present Illness Pt is a 53 y.o. M with no known PMH who presents with right sided weakness, decreased sensation on right, diplopia and headache. CT showing remote L frontal infarcts, CTA head/neck with L P2 occlusion, MRI with acute L PCA infarct including much of L thalamus, L > R cerebellum, R pons. Now s/p complete revascularization of occluded L PCA with mechanical thrombectomy. ETT 8/27-8/28; self extubated 8/28.    PT Comments    Patient seen for mobility progression. Pt continues to require +2 assist for OOB transfers and attempting pre gait activities. Pt is not as engaged in session today or yesterday. Continue to progress as tolerated.    Follow Up Recommendations  CIR;Supervision/Assistance - 24 hour     Equipment Recommendations  Wheelchair (measurements PT);Wheelchair cushion (measurements PT);3in1 (PT);Hospital bed    Recommendations for Other Services       Precautions / Restrictions Precautions Precautions: Fall Precaution Comments: R hemiparesis and inattention Restrictions Weight Bearing Restrictions: No    Mobility  Bed Mobility Overal bed mobility: Needs Assistance Bed Mobility: Supine to Sit     Supine to sit: Mod assist;HOB elevated     General bed mobility comments: assist to bring R LE to EOB and to elevate trunk into sitting; cues for sequencing   Transfers Overall transfer level: Needs assistance Equipment used: 2 person hand held assist Transfers: Sit to/from [facility] Sit to Stand: Mod assist Stand pivot transfers: Max assist;+2 physical assistance;Mod assist       General transfer comment: R knee blocked and assistance required for balance and weight shifting; cues for sequencing   Ambulation/Gait                 Stairs             Wheelchair Mobility    Modified  Rankin (Stroke Patients Only) Modified Rankin (Stroke Patients Only) Pre-Morbid Rankin Score: No symptoms Modified Rankin: Severe disability     Balance Overall balance assessment: Needs assistance Sitting-balance support: Feet supported;Single extremity supported;Bilateral upper extremity supported Sitting balance-Leahy Scale: Poor   Postural control: Right lateral lean;Posterior lean Standing balance support: Single extremity supported Standing balance-Leahy Scale: Zero Standing balance comment: attempted pre gait acitivities however pt either unable to take step with L foot or not attempting                             Cognition Arousal/Alertness: Awake/alert Behavior During Therapy: WFL for tasks assessed/performed Overall Cognitive Status: Impaired/Different from baseline Area of Impairment: Attention;Following commands;Safety/judgement;Problem solving                   Current Attention Level: Sustained   Following Commands: Follows one step commands inconsistently;Follows one step commands with increased time Safety/Judgement: Decreased awareness of safety;Decreased awareness of deficits   Problem Solving: Difficulty sequencing;Requires verbal cues;Requires tactile cues        Exercises      General Comments        Pertinent Vitals/Pain Pain Assessment: No/denies pain    Home Living                      Prior Function            PT Goals (current goals can now be found in the care plan section) Acute  Rehab PT Goals Patient Stated Goal: unable Progress towards PT goals: Progressing toward goals    Frequency    Min 4X/week      PT Plan Current plan remains appropriate    Co-evaluation              AM-PAC PT "6 Clicks" Mobility   Outcome Measure  Help needed turning from your back to your side while in a flat bed without using bedrails?: A Lot Help needed moving from lying on your back to sitting on the side of  a flat bed without using bedrails?: A Lot Help needed moving to and from a bed to a chair (including a wheelchair)?: A Lot Help needed standing up from a chair using your arms (e.g., wheelchair or bedside chair)?: A Lot Help needed to walk in hospital room?: Total Help needed climbing 3-5 steps with a railing? : Total 6 Click Score: 10    End of Session Equipment Utilized During Treatment: Gait belt Activity Tolerance: Patient tolerated treatment well Patient left: in chair;with call bell/phone within reach;with chair alarm set Nurse Communication: Mobility status PT Visit Diagnosis: Other abnormalities of gait and mobility (R26.89);Other symptoms and signs involving the nervous system (R29.898) Hemiplegia - Right/Left: Right Hemiplegia - dominant/non-dominant: Dominant Hemiplegia - caused by: Cerebral infarction     Time: [id number] PT Time Calculation (min) (ACUTE ONLY): 29 min  Charges:  $Gait Training: 23-37 mins                     Erline LevineKellyn Terianne Thaker, PTA Acute Rehabilitation Services Pager: [phone] Office: [phone]     Carolynne EdouardKellyn R Jovann Luse 04/28/2019, 4:30 PM

## 2019-04-28 NOTE — Progress Notes (Signed)
  Speech Language Pathology Treatment: Cognitive-Linquistic  Patient Details Name: Rodney Collier MRN: [id number] DOB: 09-Jan-1966 Today's Date: 04/28/2019 Time: 7867-6720 SLP Time Calculation (min) (ACUTE ONLY): 21 min  Assessment / Plan / Recommendation Clinical Impression  Pt was seen for speech/language treatment and was cooperative during the session. Speech intelligibility was improved slightly compared to when he was last seen by this SLP but is still negatively impacted by reduced vocal intensity. He responded to simple questions and produced spontaneous speech within social contexts. 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 He imitated articulatory placement for bilabial plosive phonemes with 100% accuracy. He imitated monosyllabic words with voiceless plosive /p/ with 80% accuracy increasing to 100% with min cues. He required mod-max cues to imitate voiced plosive /b/ due to difficulty with voicing. Following models and tactile feedback (i.e., palpation of larynx) during imitation of sustained vowels, pt was able to coordinate production of manner, place, and voicing to produce /b/ in isolation. He achieved 40% accuracy with production of words with /b/ increasing to 100% with mod-max cues. The session was abbreviated since pt indicated that he was fatigued. SLP will continue to follow pt.    HPI HPI: Pt is a 53 y.o. M with no known PMH who presents with right sided weakness, decreased sensation on right, diplopia and headache. CT showing remote L frontal infarcts, CTA head/neck with L P2 occlusion, MRI with acute L PCA infarct including much of L thalamus, L > R cerebellum, R pons. Now s/p complete revascularization of occluded L PCA with mechanical thrombectomy. ETT 8/27-8/28; self extubated 8/28.      SLP Plan  Continue with current plan of care       Recommendations                   Follow up Recommendations: Inpatient Rehab;24 hour  supervision/assistance SLP Visit Diagnosis: Aphasia (R47.01);Apraxia (R48.2) Plan: Continue with current plan of care       Brendi Mccarroll I. Hardin Negus, [place], [place] Office number [phone] Pager [place] 04/28/2019, 5:47 PM

## 2019-04-28 NOTE — Plan of Care (Signed)
Patient continues to be disoriented. Follows commands but only oriented to self. Plan for possible inpatient rehab.

## 2019-04-28 NOTE — Progress Notes (Addendum)
Inpatient Diabetes Program Recommendations  AACE/ADA: New Consensus Statement on Inpatient Glycemic Control (2015)  Target Ranges:  Prepandial:   less than 140 mg/dL      Peak postprandial:   less than 180 mg/dL (1-2 hours)      Critically ill patients:  140 - 180 mg/dL   Lab Results  Component Value Date   GLUCAP 124 (H) 04/28/2019   HGBA1C 12.3 (H) 04/15/2019    Review of Glycemic Control  Diabetes history: New Diagnosis  Current orders for Inpatient glycemic control:  Levemir 5 units bid Novolog 0-15 units tid, Novolog 0-5 units qhs  Metformin 850 mg bid  Inpatient Diabetes Program Recommendations:    Based on glucose trends and lower insulin doses consider Metformin 850 mg bid and Gipizide 5 mg bid outpatient for glucose control. CBG checks BID. Will need glucose meter.   Addendum 1400 pm:  Tried to speak with patient in room. Unable to understand patient clearly. Patient seemed to understand what I was telling him that he has high sugar levels and has DM, also informed him of how much insulin he was on and that I thought he could try possibly oral meds at home instead of insulin injections.  Patient had food on his chest, I assisted patient in cleaning up after lunch. I washed his face, chest, and hands with wash cloth. I adjusted pillow.  Ultimately I feel that simplified regimen with 2 oral agents would benefit patient at time of d/c.  Thanks,  Tama Headings RN, MSN, BC-ADM Inpatient Diabetes Coordinator Team Pager [phone] (8a-5p)

## 2019-04-29 LAB — GLUCOSE, CAPILLARY
Glucose-Capillary: 119 mg/dL — ABNORMAL HIGH (ref 70–99)
Glucose-Capillary: 203 mg/dL — ABNORMAL HIGH (ref 70–99)
Glucose-Capillary: 179 mg/dL — ABNORMAL HIGH (ref 70–99)
Glucose-Capillary: 105 mg/dL — ABNORMAL HIGH (ref 70–99)
Glucose-Capillary: 104 mg/dL — ABNORMAL HIGH (ref 70–99)

## 2019-04-29 MED ORDER — METFORMIN HCL 500 MG PO TABS
1000.0000 mg | ORAL_TABLET | Freq: Two times a day (BID) | ORAL | Status: DC
  Administered 2019-04-29 – 2019-06-11 (×85): 1000 mg via ORAL
  Filled 2019-04-29 (×95): qty 2

## 2019-04-29 NOTE — Progress Notes (Addendum)
Inpatient Diabetes Program Recommendations  AACE/ADA: New Consensus Statement on Inpatient Glycemic Control (2015)  Target Ranges:  Prepandial:   less than 140 mg/dL      Peak postprandial:   less than 180 mg/dL (1-2 hours)      Critically ill patients:  140 - 180 mg/dL     Review of Glycemic Control  Diabetes history: New Diagnosis  Current orders for Inpatient glycemic control:  Levemir 5 units bid Novolog 0-15 units tid, Novolog 0-5 units qhs  Metformin 850 mg bid  Inpatient Diabetes Program Recommendations:    Fasting glucose 209.   Will get second dose of Glipizide this am.   Will recheck glucose trends at lunch.  If glucose trends remain elevated consider increasing Glipizide to 10 mg bid.  Wait to titrate Metformin due to GI side effects. Will maximize oral medication therapy.  If patient is able to obtain insurance, may benefit from SGLT-2 inhibitor.  Thanks,  Tama Headings RN, MSN, BC-ADM Inpatient Diabetes Coordinator Team Pager [phone] (8a-5p)

## 2019-04-29 NOTE — Progress Notes (Signed)
STROKE TEAM PROGRESS NOTE   INTERVAL HISTORY Pt lying in bed, no neuro changes. Pending for SNF. SW is working on wether he can go by EMS truck or medical flight back to [place], [facility] will pay for it.   Vitals:   04/28/19 2332 04/29/19 0339 04/29/19 0902 04/29/19 1148  BP: (!) 148/95 135/83 130/88 134/86  Pulse: (!) 51 97 92 92  Resp: 17 18 15 18   Temp: 97.8 F (36.6 C) 98.6 F (37 C) 98.6 F (37 C) 98.6 F (37 C)  TempSrc: Oral Oral Oral Oral  SpO2: 100% 99% 100% 99%  Weight:      Height:        CBC:  Recent Labs  Lab 04/27/19 0309 04/28/19 0326  WBC 7.2 6.3  HGB 12.0* 12.1*  HCT 38.5* 38.2*  MCV 73.3* 72.8*  PLT 478* 477*    Basic Metabolic Panel:  Recent Labs  Lab 04/27/19 0309 04/28/19 0326  NA 137 136  K 4.3 3.9  CL 103 101  CO2 24 25  GLUCOSE 166* 124*  BUN 11 13  CREATININE 0.83 0.83  CALCIUM 9.5 9.3    IMAGING  CT Head WO Contrast 04/16/2019 Areas of acute infarction are showing low-density and swelling but no macroscopic hemorrhage by CT. As shown by previous MRI, there is involvement of the cerebellum, left cerebral peduncle and thalamus and left posteromedial temporal lobe and occipital lobe. Tiny focus of infarction in the right occipital lobe as well.  [name] 04/15/2019 1. Acute infarction throughout the left PCA territory (including most of the left thalamus), left more than right cerebellum, and right pons. Petechial hemorrhage is seen at the level of the pons.  2. Prior left frontal infarcts and microvascular ischemic gliosis.   Cerebral angiogram 04/14/2019 4 vessel cerebral arteriogram followed by complete revascularization of occluded Lt PCA  With x 2 passes with 5mm x 33mm embotrap retriever  device achieving a TICI 3 revascularization.  Ct Code Stroke Cta Head W/wo Contrast Ct Code Stroke Cta Neck W/wo Contrast Ct Code Stroke Cta Cerebral Perfusion W/wo Contrast 04/14/2019 1. Left P2 occlusion with downstream  reconstitution. Qualitative assessment of CT perfusion maps shows ischemia without visible core infarct in the left occipital lobe.  2. Mild atherosclerosis in the neck without flow limiting stenosis or embolic source seen.  3. No large vessel occlusion in the anterior circulation.  4. Mild mid basilar narrowing.   Ct Head Code Stroke Wo Contrast 04/14/2019 1. No acute finding.  2. Small remote appearing infarcts in the left frontal lobe.    LE Doppler 04/15/2019 Right: There is no evidence of deep vein thrombosis in the lower extremity. No cystic structure found in the popliteal fossa. Left: There is no evidence of deep vein thrombosis in the lower extremity. No cystic structure found in the popliteal fossa.  2D Echocardiogram 04/15/2019  1. The left ventricle has normal systolic function, with an ejection fraction of 55-60%. The cavity size was normal. Left ventricular diastolic parameters were normal. No evidence of left ventricular regional wall motion abnormalities.  2. The right ventricle has normal systolic function. The cavity was normal. There is no increase in right ventricular wall thickness.  3. The mitral valve is abnormal. Mild thickening of the mitral valve leaflet. The MR jet is posteriorly-directed.  4. The tricuspid valve is grossly normal.  5. The aortic valve is tricuspid. No stenosis of the aortic valve.  6. The aorta is normal unless otherwise noted.  7. The inferior vena cava was normal in size with <50% respiratory variability. SUMMARY LVEF 55-60%, normal wall thickness, normal wall motion, normal diastolic function, mild posteriorly directed MR, normal size IVC that does not collapse, no PFO by color doppler  FINDINGS  Left Ventricle: The left ventricle has normal systolic function, with an ejection fraction of 55-60%. The cavity size was normal. There is no increase in left ventricular wall thickness. Left ventricular diastolic parameters were normal. Normal left  ventricular filling pressures No evidence of left ventricular regional wall motion abnormalities..  EEG  04/15/2019 This study is suggestive of cortical dysfunction in the left hemisphere secondary to underlying stroke as well as mild diffuse encephalopathy. No seizures or definite epileptiform discharges were seen throughout the recording.  TCD w/ bubble 04/18/2019  No HTIS heard during rest. No HITS heard during valsalva.  TEE 04/19/2019 LVEF 55-60% No LA/LAA thrombus or mass No ASD or PFO by color flow Doppler or saline microcavitation study. Mild AR, mild MR.   PHYSICAL EXAM    General - Well nourished, well developed, African gentleman who is not in distress  Ophthalmologic - fundi not visualized due to noncooperation.  Cardiovascular - Regular rate and rhythm.  Neuro - awake, alert, eyes open, following most of the simple commands. Anarthria.  Disconjugate eyes, left eye CN III palsy with pupil sparing, right eye CN VI palsy, PERRL. Not blinking to visual threat on the right. Right facial droop, tongue protrusion not cooperative, against gravity of LUE but significant ataxia, able to follow simple commands with left hand. lifts left leg slightly off of the bed on command, follow simple commands of the left foot. On pain stimulation, slight withdraw of RLE but no movement of RUE. Increased muscle tone on the RUE. Positive triple reflex on the right. Sensation and gait not tested.   ASSESSMENT/PLAN Mr. Ryce Vardeman is a 53 y.o. male with no significant past medical history presenting with right-sided weakness, decreased sensation on the right, diplopia and headache. Complete revascularization of occluded L PCA w/ mechanical thrombectomy.  Stroke: embolic multifocal posterior circulation infarcts with left P2 occlusion s/p IR with TICI3 reperfusion - likely embolic secondary to hypercoagulability secondary to metabolic syndrome  Code Stroke CT head No acute abnormality. Old infarct L  frontal lobe.   CTA head & neck L P2 occlusion. Mild neck atherosclerosis . No LVO anterior circulation. Mild mid BA narrowing.  CT perfusion ischemia w/o visible core  CT Head WO Contrast - 04/16/19 - Areas of acute infarction are showing low-density and swelling but no macroscopic hemorrhage by CT.  As shown by previous MRI, there is involvement of the cerebellum, left cerebral peduncle and thalamus and left posteromedial temporal lobe and occipital lobe. Tiny focus of infarction in the right occipital lobe as well.  Cerebral angio TICI reperfusion of occluded L PCA  MRI  L PCA (including thalamus), L>R cerebellum, R pontine and left midbrain infarct. Petechial hemorrhage seen in pons. Old L frontal infarcts.   LE Doppler no DVT - no evidence of deep vein thrombosis   2D Echo - EF 55-60%.  No cardiac source of emboli identified.   TCD bubble no HITS  TEE neg  UDS - pos for benzos - UDS done on 9/1  EEG - cortical dysfunction but no seizures.  LDL 88  HgbA1c 12.3  Lovenox 40 mg sq daily  for VTE prophylaxis  No antithrombotic prior to admission, now on aspirin 81 mg daily and clopidogrel 75 mg  daily x 3 weeks, then aspirin alone. (Plavix started 04/21/19 - last dose 05/12/19)  Therapy recommendations:  CIR -> D/t lack of post hospitalization family support - will pursue SNF  Medically ready for d/c when bed found  Disposition:  pending    Possible seizure  Full body rigid shaking hours post IR, L>R  Loaded with keppra  EEG - cortical dysfunction but no seizures.  Continue keppra 500mg  bid  Hypertension  No hx of HTN, on no home meds  Placed On cleviprex gtt for BP control post IR, now off . On amlodipine 10  . BP 130s  . Long-term BP goal normotensive  Hyperlipidemia  Home meds:  No statin  LDL 88, goal < 70  HDL 39  Add lipitor 20  Continue statin at discharge  Diabetes type II, new diagnosis, Uncontrolled Hyperglycemia, improving  HgbA1c 12.3,  goal < 7.0  CBGs  SSI   Put on metformin 850 bid -> increase to 1000mg  bid  DM coordinator consulted - had placed on levemir 5U bid - now recommends Metformin 850 mg bid and Glipizide 5 mg bid outpatient for glucose control. Made adjustment prior to d/c.  Elevated FBS this am. Consider increase of glipizde to 10 mg bid should glucoses remain elevated. Hold metformin titration d.t GI SE. May benefit from SGLT-2inhibitior if he gets insurance   Likely hypercoagulable state associated with uncontrolled DM  Dysphagia . Secondary to stroke . Speech on board . cleared for Dysphagia 3 nectar thick liquids with MBS-> D2 nectar thick . Encourage po intake . Continue IVF @ 85 -  d/c on discharge  UTI with fever  UA showed WBC > 50  Urine culture enterobactor and staph coag neg  Sensitive to cipro  finished cipro 500mg  bid course (ends 9/8)  Fever resolved   Other Stroke Risk Factors   hx of stroke - small remote appearing infarcts in the left frontal lobe by CT and MRI  Other Active Problems  Microcytic anemia - iron 38, Ferritin 115 - put on iron tab bid for 14 days.   Pt found on floor 04/23/19 by nursing staff. No obvious injury, complaints of pain, or neurologic changes.   Hospital day # 15  Rosalin Hawking, MD PhD Stroke Neurology 04/29/2019 1:13 PM    To contact Stroke Continuity provider, please refer to [email or web address] After hours, contact General Neurology

## 2019-04-29 NOTE — Progress Notes (Signed)
Physical Therapy Treatment Patient Details Name: Rodney Collier MRN: [id number] DOB: Nov 18, 1965 Today's Date: 04/29/2019    History of Present Illness Pt is a 53 y.o. M with no known PMH who presents with right sided weakness, decreased sensation on right, diplopia and headache. CT showing remote L frontal infarcts, CTA head/neck with L P2 occlusion, MRI with acute L PCA infarct including much of L thalamus, L > R cerebellum, R pons. Now s/p complete revascularization of occluded L PCA with mechanical thrombectomy. ETT 8/27-8/28; self extubated 8/28.    PT Comments    Patient seen for mobility progression. This session focused on functional transfer training and pre gait activities. Continue to recommend post acute rehab for further skilled PT services to maximize independence and safety with mobility.     Follow Up Recommendations  CIR;Supervision/Assistance - 24 hour     Equipment Recommendations  Wheelchair (measurements PT);Wheelchair cushion (measurements PT);3in1 (PT);Hospital bed    Recommendations for Other Services       Precautions / Restrictions Precautions Precautions: Fall Precaution Comments: R hemiparesis and inattention Restrictions Weight Bearing Restrictions: No    Mobility  Bed Mobility Overal bed mobility: Needs Assistance Bed Mobility: Supine to Sit     Supine to sit: Mod assist;HOB elevated     General bed mobility comments: use of rail and assist to bring R LE/hip to EOB and to elevate trunk all the way into sitting   Transfers Overall transfer level: Needs assistance Equipment used: 2 person hand held assist Transfers: Sit to/from [facility] Sit to Stand: Mod assist;+2 physical assistance;+2 safety/equipment   Squat pivot transfers: Mod assist;+2 safety/equipment     General transfer comment: sit to stands X 4; R knee blocked and R UE supported by therapist; cues for sequencing   Ambulation/Gait             General  Gait Details: worked on pre gait activities with +2 assist for balance   Stairs             Wheelchair Mobility    Modified Rankin (Stroke Patients Only) Modified Rankin (Stroke Patients Only) Pre-Morbid Rankin Score: No symptoms Modified Rankin: Severe disability     Balance Overall balance assessment: Needs assistance Sitting-balance support: Feet supported;Single extremity supported;Bilateral upper extremity supported Sitting balance-Leahy Scale: Poor   Postural control: Right lateral lean;Posterior lean Standing balance support: Single extremity supported Standing balance-Leahy Scale: Zero                              Cognition Arousal/Alertness: Awake/alert Behavior During Therapy: WFL for tasks assessed/performed Overall Cognitive Status: Difficult to assess                                 General Comments: pt attempting to speak at times but mostly unintelligible speech; pt nodding yes/no to questions; pt follows single step commands inconsistently and is slightly impulsive       Exercises      General Comments        Pertinent Vitals/Pain Pain Assessment: No/denies pain Faces Pain Scale: No hurt    Home Living                      Prior Function            PT Goals (current goals can now be found in the care  plan section) Progress towards PT goals: Progressing toward goals    Frequency    Min 4X/week      PT Plan Current plan remains appropriate    Co-evaluation              AM-PAC PT "6 Clicks" Mobility   Outcome Measure  Help needed turning from your back to your side while in a flat bed without using bedrails?: A Lot Help needed moving from lying on your back to sitting on the side of a flat bed without using bedrails?: A Lot Help needed moving to and from a bed to a chair (including a wheelchair)?: A Lot Help needed standing up from a chair using your arms (e.g., wheelchair or bedside  chair)?: A Lot Help needed to walk in hospital room?: Total Help needed climbing 3-5 steps with a railing? : Total 6 Click Score: 10    End of Session Equipment Utilized During Treatment: Gait belt Activity Tolerance: Patient tolerated treatment well Patient left: in chair;with call bell/phone within reach;with chair alarm set Nurse Communication: Mobility status PT Visit Diagnosis: Other abnormalities of gait and mobility (R26.89);Other symptoms and signs involving the nervous system (R29.898) Hemiplegia - Right/Left: Right Hemiplegia - dominant/non-dominant: Dominant Hemiplegia - caused by: Cerebral infarction     Time: [id number] PT Time Calculation (min) (ACUTE ONLY): 25 min  Charges:  $Gait Training: 23-37 mins                     Erline LevineKellyn Visente Kirker, PTA Acute Rehabilitation Services Pager: [phone] Office: [phone]     Carolynne EdouardKellyn R Yoshimi Sarr 04/29/2019, 11:14 AM

## 2019-04-29 NOTE — Progress Notes (Signed)
  Speech Language Pathology Treatment: Dysphagia;Cognitive-Linquistic  Patient Details Name: Rodney Collier MRN: [id number] DOB: 08/14/1966 Today's Date: 04/29/2019 Time: [id number] SLP Time Calculation (min) (ACUTE ONLY): 28 min  Assessment / Plan / Recommendation Clinical Impression  Pt needs Min cues for simple command following within functional context. Mod cues were given to initiate verbal output and phonation. A few clear words were noted but most of his speech was not comprehensible, suspect from a combination of intelligibility and aphasia. He responded mostly via head nods yes/no, given Min cues to initiate a response. Pt consumed little of his breakfast tray but did so at a rapid rate. Mod cues provided for slower pacing and management of R sided anterior spillage and buccal pocketing. A single, immediate throat clear was noted but with no overt s/s of aspiration otherwise. Pt nonverbally indicated that he did not want any more of his meal. Recommend continuing current diet.    HPI HPI: Pt is a 53 y.o. M with no known PMH who presents with right sided weakness, decreased sensation on right, diplopia and headache. CT showing remote L frontal infarcts, CTA head/neck with L P2 occlusion, MRI with acute L PCA infarct including much of L thalamus, L > R cerebellum, R pons. Now s/p complete revascularization of occluded L PCA with mechanical thrombectomy. ETT 8/27-8/28; self extubated 8/28.      SLP Plan  Continue with current plan of care       Recommendations  Diet recommendations: Dysphagia 2 (fine chop);Nectar-thick liquid Liquids provided via: Cup Medication Administration: Crushed with puree Supervision: Patient able to self feed;Full supervision/cueing for compensatory strategies;Staff to assist with self feeding Compensations: Minimize environmental distractions;Small sips/bites;Slow rate;Lingual sweep for clearance of pocketing Postural Changes and/or Swallow Maneuvers: Seated  upright 90 degrees                Oral Care Recommendations: Oral care BID Follow up Recommendations: Inpatient Rehab;24 hour supervision/assistance SLP Visit Diagnosis: Aphasia (R47.01);Apraxia (R48.2);Dysphagia, oropharyngeal phase (R13.12) Plan: Continue with current plan of care       [place] Halley Kincer 04/29/2019, 9:58 AM  Pollyann Glen, M.A. [place] Acute Environmental education officer [phone] Office [phone]

## 2019-04-30 LAB — BASIC METABOLIC PANEL
Anion gap: 13 (ref 5–15)
BUN: 10 mg/dL (ref 6–20)
CO2: 20 mmol/L — ABNORMAL LOW (ref 22–32)
Calcium: 9.3 mg/dL (ref 8.9–10.3)
Chloride: 102 mmol/L (ref 98–111)
Creatinine, Ser: 0.75 mg/dL (ref 0.61–1.24)
GFR calc Af Amer: >60 mL/min (ref >60)
GFR calc non Af Amer: >60 mL/min (ref >60)
Glucose, Bld: 101 mg/dL — ABNORMAL HIGH (ref 70–99)
Potassium: 3.9 mmol/L (ref 3.5–5.1)
Sodium: 135 mmol/L (ref 135–145)

## 2019-04-30 LAB — CBC
HCT: 39.1 % (ref 39.0–52.0)
Hemoglobin: 12.4 g/dL — ABNORMAL LOW (ref 13.0–17.0)
MCH: 22.8 pg — ABNORMAL LOW (ref 26.0–34.0)
MCHC: 31.7 g/dL (ref 30.0–36.0)
MCV: 72 fL — ABNORMAL LOW (ref 80.0–100.0)
Platelets: 491 10*3/uL — ABNORMAL HIGH (ref 150–400)
RBC: 5.43 MIL/uL (ref 4.22–5.81)
RDW: 13.9 % (ref 11.5–15.5)
WBC: 7 10*3/uL (ref 4.0–10.5)
nRBC: 0 % (ref 0.0–0.2)

## 2019-04-30 LAB — GLUCOSE, CAPILLARY
Glucose-Capillary: 112 mg/dL — ABNORMAL HIGH (ref 70–99)
Glucose-Capillary: 162 mg/dL — ABNORMAL HIGH (ref 70–99)
Glucose-Capillary: 68 mg/dL — ABNORMAL LOW (ref 70–99)
Glucose-Capillary: 92 mg/dL (ref 70–99)
Glucose-Capillary: 66 mg/dL — ABNORMAL LOW (ref 70–99)
Glucose-Capillary: 109 mg/dL — ABNORMAL HIGH (ref 70–99)

## 2019-04-30 NOTE — Progress Notes (Signed)
STROKE TEAM PROGRESS NOTE   INTERVAL HISTORY Pt lying in bed, no neuro changes. Pending for SNF. SW is working on wether he can go by EMS truck or medical flight back to [place], [facility] will pay for it.  No changes.  Vitals:   04/30/19 0005 04/30/19 0518 04/30/19 0833 04/30/19 1220  BP: (!) 146/102 (!) 137/92 (!) 147/92 (!) 147/82  Pulse: 74 84 77 85  Resp: 18 18 18 18   Temp: 98.8 F (37.1 C) 98.2 F (36.8 C) 97.6 F (36.4 C) 98.3 F (36.8 C)  TempSrc: Oral Oral Oral Oral  SpO2: 100% 99% 100% 100%  Weight:      Height:        CBC:  Recent Labs  Lab 04/27/19 0309 04/28/19 0326  WBC 7.2 6.3  HGB 12.0* 12.1*  HCT 38.5* 38.2*  MCV 73.3* 72.8*  PLT 478* 477*    Basic Metabolic Panel:  Recent Labs  Lab 04/27/19 0309 04/28/19 0326  NA 137 136  K 4.3 3.9  CL 103 101  CO2 24 25  GLUCOSE 166* 124*  BUN 11 13  CREATININE 0.83 0.83  CALCIUM 9.5 9.3    IMAGING  CT Head WO Contrast 04/16/2019 Areas of acute infarction are showing low-density and swelling but no macroscopic hemorrhage by CT. As shown by previous MRI, there is involvement of the cerebellum, left cerebral peduncle and thalamus and left posteromedial temporal lobe and occipital lobe. Tiny focus of infarction in the right occipital lobe as well.  [name] 04/15/2019 1. Acute infarction throughout the left PCA territory (including most of the left thalamus), left more than right cerebellum, and right pons. Petechial hemorrhage is seen at the level of the pons.  2. Prior left frontal infarcts and microvascular ischemic gliosis.   Cerebral angiogram 04/14/2019 4 vessel cerebral arteriogram followed by complete revascularization of occluded Lt PCA  With x 2 passes with 8mm x 61mm embotrap retriever  device achieving a TICI 3 revascularization.  Ct Code Stroke Cta Head W/wo Contrast Ct Code Stroke Cta Neck W/wo Contrast Ct Code Stroke Cta Cerebral Perfusion W/wo Contrast 04/14/2019 1. Left P2 occlusion  with downstream reconstitution. Qualitative assessment of CT perfusion maps shows ischemia without visible core infarct in the left occipital lobe.  2. Mild atherosclerosis in the neck without flow limiting stenosis or embolic source seen.  3. No large vessel occlusion in the anterior circulation.  4. Mild mid basilar narrowing.   Ct Head Code Stroke Wo Contrast 04/14/2019 1. No acute finding.  2. Small remote appearing infarcts in the left frontal lobe.    LE Doppler 04/15/2019 Right: There is no evidence of deep vein thrombosis in the lower extremity. No cystic structure found in the popliteal fossa. Left: There is no evidence of deep vein thrombosis in the lower extremity. No cystic structure found in the popliteal fossa.  2D Echocardiogram 04/15/2019  1. The left ventricle has normal systolic function, with an ejection fraction of 55-60%. The cavity size was normal. Left ventricular diastolic parameters were normal. No evidence of left ventricular regional wall motion abnormalities.  2. The right ventricle has normal systolic function. The cavity was normal. There is no increase in right ventricular wall thickness.  3. The mitral valve is abnormal. Mild thickening of the mitral valve leaflet. The MR jet is posteriorly-directed.  4. The tricuspid valve is grossly normal.  5. The aortic valve is tricuspid. No stenosis of the aortic valve.  6. The aorta is  normal unless otherwise noted.  7. The inferior vena cava was normal in size with <50% respiratory variability. SUMMARY LVEF 55-60%, normal wall thickness, normal wall motion, normal diastolic function, mild posteriorly directed MR, normal size IVC that does not collapse, no PFO by color doppler  FINDINGS  Left Ventricle: The left ventricle has normal systolic function, with an ejection fraction of 55-60%. The cavity size was normal. There is no increase in left ventricular wall thickness. Left ventricular diastolic parameters were  normal. Normal left ventricular filling pressures No evidence of left ventricular regional wall motion abnormalities..  EEG  04/15/2019 This study is suggestive of cortical dysfunction in the left hemisphere secondary to underlying stroke as well as mild diffuse encephalopathy. No seizures or definite epileptiform discharges were seen throughout the recording.  TCD w/ bubble 04/18/2019  No HTIS heard during rest. No HITS heard during valsalva.  TEE 04/19/2019 LVEF 55-60% No LA/LAA thrombus or mass No ASD or PFO by color flow Doppler or saline microcavitation study. Mild AR, mild MR.   PHYSICAL EXAM    General - Well nourished, well developed, African gentleman who is not in distress  Ophthalmologic - fundi not visualized due to noncooperation.  Cardiovascular - Regular rate and rhythm.  Neuro - awake, alert, eyes open, following most of the simple commands. Anarthria.  Dysconjugate eyes, left eye CN III palsy with pupil sparing, right eye CN VI palsy, PERRL. Not blinking to visual threat on the right. Right facial droop, tongue protrusion not cooperative, against gravity of LUE but significant ataxia, able to follow simple commands with left hand. lifts left leg slightly off of the bed on command, follow simple commands of the left foot. On pain stimulation, slight withdraw of RLE but no movement of RUE. Increased muscle tone on the RUE. Positive triple reflex on the right. Sensation and gait not tested.   ASSESSMENT/PLAN [name] with no significant past medical history presenting with right-sided weakness, decreased sensation on the right, diplopia and headache. Complete revascularization of occluded L PCA w/ mechanical thrombectomy.  Stroke: embolic multifocal posterior circulation infarcts with left P2 occlusion s/p IR with TICI3 reperfusion - likely embolic secondary to hypercoagulability secondary to metabolic syndrome  Code Stroke CT head No acute  abnormality. Old infarct L frontal lobe.   CTA head & neck L P2 occlusion. Mild neck atherosclerosis . No LVO anterior circulation. Mild mid BA narrowing.  CT perfusion ischemia w/o visible core  CT Head WO Contrast - 04/16/19 - Areas of acute infarction are showing low-density and swelling but no macroscopic hemorrhage by CT.  As shown by previous MRI, there is involvement of the cerebellum, left cerebral peduncle and thalamus and left posteromedial temporal lobe and occipital lobe. Tiny focus of infarction in the right occipital lobe as well.  Cerebral angio TICI reperfusion of occluded L PCA  MRI  L PCA (including thalamus), L>R cerebellum, R pontine and left midbrain infarct. Petechial hemorrhage seen in pons. Old L frontal infarcts.   LE Doppler no DVT - no evidence of deep vein thrombosis   2D Echo - EF 55-60%.  No cardiac source of emboli identified.   TCD bubble no HITS  TEE neg  UDS - pos for benzos - UDS done on 9/1  EEG - cortical dysfunction but no seizures.  LDL 88  HgbA1c 12.3  Lovenox 40 mg sq daily  for VTE prophylaxis  No antithrombotic prior to admission, now on aspirin 81 mg  daily and clopidogrel 75 mg daily x 3 weeks, then aspirin alone. (Plavix started 04/21/19 - last dose 05/12/19)  Therapy recommendations:  CIR -> D/t lack of post hospitalization family support - will pursue SNF  Medically ready for d/c when bed found  Disposition:  pending    Possible seizure  Full body rigid shaking hours post IR, L>R  Loaded with keppra  EEG - cortical dysfunction but no seizures.  Continue keppra 500mg  bid  Hypertension  No hx of HTN, on no home meds  Placed On cleviprex gtt for BP control post IR, now off . On amlodipine 10  . BP 130s  . Long-term BP goal normotensive  Hyperlipidemia  Home meds:  No statin  LDL 88, goal < 70  HDL 39  Add lipitor 20  Continue statin at discharge  Diabetes type II, new diagnosis, Uncontrolled Hyperglycemia,  improving  HgbA1c 12.3, goal < 7.0  CBGs  SSI   Put on metformin 850 bid -> increase to 1000mg  bid  DM coordinator consulted - had placed on levemir 5U bid - now recommends Metformin 850 mg bid and Glipizide 5 mg bid outpatient for glucose control. Made adjustment prior to d/c.  Elevated FBS this am. Consider increase of glipizde to 10 mg bid should glucoses remain elevated. Hold metformin titration d.t GI SE. May benefit from SGLT-2inhibitior if he gets insurance   Likely hypercoagulable state associated with uncontrolled DM  Dysphagia . Secondary to stroke . Speech on board . cleared for Dysphagia 3 nectar thick liquids with MBS-> D2 nectar thick . Encourage po intake . Continue IVF @ 40 -  d/c on discharge  UTI with fever  UA showed WBC > 50  Urine culture enterobactor and staph coag neg  Sensitive to cipro  Finished cipro 500mg  bid course (ends 9/8)  Fever resolved   Other Stroke Risk Factors   hx of stroke - small remote appearing infarcts in the left frontal lobe by CT and MRI  Other Active Problems  Microcytic anemia - iron 38, Ferritin 115 - put on iron tab bid for 14 days.   Pt found on floor 04/23/19 by nursing staff. No obvious injury, complaints of pain, or neurologic changes.   Hospital day # 16  Patient medically stable to be transferred to SNF next week and social work is working on mode of transportation. Delia HeadyPramod [place] Liz, MD  To contact Stroke Continuity provider, please refer to [email or web address]. After hours, contact General Neurology

## 2019-04-30 NOTE — TOC Progression Note (Signed)
Transition of Care [facility]) - Progression Note    Patient Details  Name: Rodney Collier MRN: [id number] Date of Birth: 05/13/1966  [facility] CM/SW [place], [place] Phone Number: 04/30/2019, 8:49 AM  Clinical Narrative:  CSW reached out to financial counselor again after receiving no response to ask about assistance in applying for Medicaid in [place]. Per financial counselor Janett Billow, the financial counseling office does not know anything about Medicaid in a state that far away and they are unable to assist. The family will have to complete the Medicaid application on behalf of the patient.   CSW researched how to apply for [place] Medicaid, and there appears to be an Fish farm manager through [employer]. CSW to provide information to patient's daughter and ensure that she has technology access to apply online for the patient.  CSW also discussed barriers to discharge with MD, including the patient not having insurance, the daughter will need to apply for Medicaid, the patient will need to get back to [place] as that is where his support is, and how we will get the patient up there. Per MD, patient could handle the transportation back to [place] both by ambulance and by air, with the preference being for air flight due to the concerns with the patient being sedentary for an approximately 15 hour drive after a stroke. CSW indicated that his case is being reviewed to see if the hospital can cover his travel expenses as his family would not be able to afford it. CSW to continue to follow.     Expected Discharge Plan: [place] Barriers to Discharge: Continued Medical Work up, Inadequate or no insurance  Expected Discharge Plan and Services Expected Discharge Plan: [place] Acute Care Choice: [place]                                         Social Determinants of Health (SDOH) Interventions    Readmission Risk  Interventions No flowsheet data found.

## 2019-05-01 LAB — GLUCOSE, CAPILLARY
Glucose-Capillary: 114 mg/dL — ABNORMAL HIGH (ref 70–99)
Glucose-Capillary: 165 mg/dL — ABNORMAL HIGH (ref 70–99)
Glucose-Capillary: 123 mg/dL — ABNORMAL HIGH (ref 70–99)
Glucose-Capillary: 66 mg/dL — ABNORMAL LOW (ref 70–99)
Glucose-Capillary: 78 mg/dL (ref 70–99)

## 2019-05-01 MED ORDER — BENAZEPRIL HCL 5 MG PO TABS
5.0000 mg | ORAL_TABLET | Freq: Two times a day (BID) | ORAL | Status: DC
  Administered 2019-05-01 – 2019-05-07 (×13): 5 mg via ORAL
  Filled 2019-05-01 (×16): qty 1

## 2019-05-01 MED ORDER — SIMETHICONE 80 MG PO CHEW: 80.0000 mg | CHEWABLE_TABLET | Freq: Four times a day (QID) | ORAL | Status: DC | PRN

## 2019-05-01 NOTE — Progress Notes (Signed)
STROKE TEAM PROGRESS NOTE   INTERVAL HISTORY Pt lying in bed, no neuro changes. Pending for SNF. No complaints.  No changes.  Vitals:   04/30/19 2308 05/01/19 0340 05/01/19 0827 05/01/19 1113  BP: (!) 160/97 (!) 151/93 (!) 166/93 (!) 142/90  Pulse: 98 89 88 89  Resp: 18 18 20 16   Temp: 98.4 F (36.9 C) 98.3 F (36.8 C) 97.7 F (36.5 C) 98.4 F (36.9 C)  TempSrc: Oral Oral Axillary Oral  SpO2: 100% 100% 99% 98%  Weight:      Height:        CBC:  Recent Labs  Lab 04/28/19 0326 04/30/19 1930  WBC 6.3 7.0  HGB 12.1* 12.4*  HCT 38.2* 39.1  MCV 72.8* 72.0*  PLT 477* 491*    Basic Metabolic Panel:  Recent Labs  Lab 04/28/19 0326 04/30/19 1930  NA 136 135  K 3.9 3.9  CL 101 102  CO2 25 20*  GLUCOSE 124* 101*  BUN 13 10  CREATININE 0.83 0.75  CALCIUM 9.3 9.3    IMAGING  CT Head WO Contrast 04/16/2019 Areas of acute infarction are showing low-density and swelling but no macroscopic hemorrhage by CT. As shown by previous MRI, there is involvement of the cerebellum, left cerebral peduncle and thalamus and left posteromedial temporal lobe and occipital lobe. Tiny focus of infarction in the right occipital lobe as well.  [name] 04/15/2019 1. Acute infarction throughout the left PCA territory (including most of the left thalamus), left more than right cerebellum, and right pons. Petechial hemorrhage is seen at the level of the pons.  2. Prior left frontal infarcts and microvascular ischemic gliosis.   Cerebral angiogram 04/14/2019 4 vessel cerebral arteriogram followed by complete revascularization of occluded Lt PCA  With x 2 passes with 63mm x 30mm embotrap retriever  device achieving a TICI 3 revascularization.  Ct Code Stroke Cta Head W/wo Contrast Ct Code Stroke Cta Neck W/wo Contrast Ct Code Stroke Cta Cerebral Perfusion W/wo Contrast 04/14/2019 1. Left P2 occlusion with downstream reconstitution. Qualitative assessment of CT perfusion maps shows  ischemia without visible core infarct in the left occipital lobe.  2. Mild atherosclerosis in the neck without flow limiting stenosis or embolic source seen.  3. No large vessel occlusion in the anterior circulation.  4. Mild mid basilar narrowing.   Ct Head Code Stroke Wo Contrast 04/14/2019 1. No acute finding.  2. Small remote appearing infarcts in the left frontal lobe.    LE Doppler 04/15/2019 Right: There is no evidence of deep vein thrombosis in the lower extremity. No cystic structure found in the popliteal fossa. Left: There is no evidence of deep vein thrombosis in the lower extremity. No cystic structure found in the popliteal fossa.  2D Echocardiogram 04/15/2019  1. The left ventricle has normal systolic function, with an ejection fraction of 55-60%. The cavity size was normal. Left ventricular diastolic parameters were normal. No evidence of left ventricular regional wall motion abnormalities.  2. The right ventricle has normal systolic function. The cavity was normal. There is no increase in right ventricular wall thickness.  3. The mitral valve is abnormal. Mild thickening of the mitral valve leaflet. The MR jet is posteriorly-directed.  4. The tricuspid valve is grossly normal.  5. The aortic valve is tricuspid. No stenosis of the aortic valve.  6. The aorta is normal unless otherwise noted.  7. The inferior vena cava was normal in size with <50% respiratory variability. SUMMARY LVEF  55-60%, normal wall thickness, normal wall motion, normal diastolic function, mild posteriorly directed MR, normal size IVC that does not collapse, no PFO by color doppler  FINDINGS  Left Ventricle: The left ventricle has normal systolic function, with an ejection fraction of 55-60%. The cavity size was normal. There is no increase in left ventricular wall thickness. Left ventricular diastolic parameters were normal. Normal left ventricular filling pressures No evidence of left ventricular  regional wall motion abnormalities..  EEG  04/15/2019 This study is suggestive of cortical dysfunction in the left hemisphere secondary to underlying stroke as well as mild diffuse encephalopathy. No seizures or definite epileptiform discharges were seen throughout the recording.  TCD w/ bubble 04/18/2019  No HTIS heard during rest. No HITS heard during valsalva.  TEE 04/19/2019 LVEF 55-60% No LA/LAA thrombus or mass No ASD or PFO by color flow Doppler or saline microcavitation study. Mild AR, mild MR.   PHYSICAL EXAM    General - Well nourished, well developed, African gentleman who is not in distress  Ophthalmologic - fundi not visualized due to noncooperation.  Cardiovascular - Regular rate and rhythm.  Neuro - awake, alert, eyes open, following most of the simple commands. Anarthria.  Dysconjugate eyes, left eye CN III palsy with pupil sparing, right eye CN VI palsy, PERRL. Not blinking to visual threat on the right. Right facial droop, tongue protrusion not cooperative, against gravity of LUE but significant ataxia, able to follow simple commands with left hand. lifts left leg slightly off of the bed on command, follow simple commands of the left foot. On pain stimulation, slight withdraw of RLE but no movement of RUE. Increased muscle tone on the RUE. Positive triple reflex on the right. Sensation and gait not tested.   ASSESSMENT/PLAN Mr. Amias Zur is a 53 y.o. male with no significant past medical history presenting with right-sided weakness, decreased sensation on the right, diplopia and headache. Complete revascularization of occluded L PCA w/ mechanical thrombectomy.  Stroke: embolic multifocal posterior circulation infarcts with left P2 occlusion s/p IR with TICI3 reperfusion - likely embolic secondary to hypercoagulability secondary to metabolic syndrome  Code Stroke CT head No acute abnormality. Old infarct L frontal lobe.   CTA head & neck L P2 occlusion. Mild neck  atherosclerosis . No LVO anterior circulation. Mild mid BA narrowing.  CT perfusion ischemia w/o visible core  CT Head WO Contrast - 04/16/19 - Areas of acute infarction are showing low-density and swelling but no macroscopic hemorrhage by CT.  As shown by previous MRI, there is involvement of the cerebellum, left cerebral peduncle and thalamus and left posteromedial temporal lobe and occipital lobe. Tiny focus of infarction in the right occipital lobe as well.  Cerebral angio TICI reperfusion of occluded L PCA  MRI  L PCA (including thalamus), L>R cerebellum, R pontine and left midbrain infarct. Petechial hemorrhage seen in pons. Old L frontal infarcts.   LE Doppler no DVT - no evidence of deep vein thrombosis   2D Echo - EF 55-60%.  No cardiac source of emboli identified.   TCD bubble no HITS  TEE neg  UDS - pos for benzos - UDS done on 9/1  EEG - cortical dysfunction but no seizures.  LDL 88  HgbA1c 12.3  Lovenox 40 mg sq daily  for VTE prophylaxis  No antithrombotic prior to admission, now on aspirin 81 mg daily and clopidogrel 75 mg daily x 3 weeks, then aspirin alone. (Plavix started 04/21/19 - last dose 05/12/19)  Therapy recommendations:  CIR -> D/t lack of post hospitalization family support - will pursue SNF - will need rapid covid testing prior to D/C  Medically ready for d/c when bed found  Disposition:  pending    Possible seizure  Full body rigid shaking hours post IR, L>R  Loaded with keppra  EEG - cortical dysfunction but no seizures.  Continue keppra 500mg  bid  Hypertension  No hx of HTN, on no home meds  Placed On cleviprex gtt for BP control post IR, now off . On amlodipine 10  . BP 130s  . Long-term BP goal normotensive . BP still running a little too high. Will add ACE I (Lotensin) 05/01/2019  Hyperlipidemia  Home meds:  No statin  LDL 88, goal < 70  HDL 39  Add lipitor 20  Continue statin at discharge  Diabetes type II, new  diagnosis, Uncontrolled Hyperglycemia, improving  HgbA1c 12.3, goal < 7.0  CBGs  SSI   Put on metformin 850 bid -> increase to 1000mg  bid  DM coordinator consulted - had placed on levemir 5U bid - now recommends Metformin 850 mg bid and Glipizide 5 mg bid outpatient for glucose control. Made adjustment prior to d/c.  Elevated FBS this am. Consider increase of glipizde to 10 mg bid should glucoses remain elevated. Hold metformin titration d.t GI SE. May benefit from SGLT-2inhibitior if he gets insurance   Likely hypercoagulable state associated with uncontrolled DM  Dysphagia . Secondary to stroke . Speech on board . cleared for Dysphagia 3 nectar thick liquids with MBS-> D2 nectar thick . Encourage po intake . Continue IVF @ 40 -  d/c on discharge  UTI with fever  UA showed WBC > 50  Urine culture enterobactor and staph coag neg  Sensitive to cipro  Finished cipro 500mg  bid course (ends 9/8)  Fever resolved   Other Stroke Risk Factors   hx of stroke - small remote appearing infarcts in the left frontal lobe by CT and MRI  Other Active Problems  Microcytic anemia - iron 38, Ferritin 115 - put on iron tab bid for 14 days.   Pt found on floor 04/23/19 by nursing staff. No obvious injury, complaints of pain, or neurologic changes.   Hospital day # 17  Patient medically stable to be transferred to SNF next week and social work is working on mode of transportation. Delia HeadyPramod Sethi, MD  To contact Stroke Continuity provider, please refer to [email or web address]. After hours, contact General Neurology

## 2019-05-02 LAB — GLUCOSE, CAPILLARY
Glucose-Capillary: 132 mg/dL — ABNORMAL HIGH (ref 70–99)
Glucose-Capillary: 87 mg/dL (ref 70–99)
Glucose-Capillary: 134 mg/dL — ABNORMAL HIGH (ref 70–99)
Glucose-Capillary: 91 mg/dL (ref 70–99)

## 2019-05-02 NOTE — Progress Notes (Signed)
Occupational Therapy Treatment Patient Details Name: Rodney Collier MRN: [id number] DOB: 11-11-1965 Today's Date: 05/02/2019    History of present illness Pt is a 53 y.o. M with no known PMH who presents with right sided weakness, decreased sensation on right, diplopia and headache. CT showing remote L frontal infarcts, CTA head/neck with L P2 occlusion, MRI with acute L PCA infarct including much of L thalamus, L > R cerebellum, R pons. Now s/p complete revascularization of occluded L PCA with mechanical thrombectomy. ETT 8/27-8/28; self extubated 8/28.   OT comments  Pt progressing towards established established OT goals and very motivated. Pt performing grooming tasks at EOB with Min A. Pt demonstrating increased sitting balance and following of commands. Pt performing stand pivot to [facility] with Mod A and then requiring Max A for toilet hygiene. Pt performing functional mobility with Max A + 2 using "three musketeer method" to take 6 steps. Continue to recommend dc to post-acute rehab and will continue to follow acutely.    Follow Up Recommendations  CIR    Equipment Recommendations  Other (comment)(Defer to next venue)    Recommendations for Other Services      Precautions / Restrictions Precautions Precautions: Fall Precaution Comments: R hemiparesis and inattention Restrictions Weight Bearing Restrictions: No       Mobility Bed Mobility Overal bed mobility: Needs Assistance Bed Mobility: Rolling;Sidelying to Sit Rolling: Min guard Sidelying to sit: Min assist;HOB elevated       General bed mobility comments: Pt rolling onto his right side. Min A to bring LLE under RLE to push them over EOB. Min A to initatie trunk up into sitting and to weight bear through RLE  Transfers Overall transfer level: Needs assistance Equipment used: 2 person hand held assist Transfers: Sit to/from [employer] Transfers Sit to Stand: +2 physical assistance;+2 safety/equipment;Min  assist Stand pivot transfers: Mod assist;+2 physical assistance       General transfer comment: Min A to power up into standing and then gain balance. Mod A to pivot to [facility]     Balance Overall balance assessment: Needs assistance Sitting-balance support: Feet supported;Single extremity supported;Bilateral upper extremity supported Sitting balance-Leahy Scale: Poor     Standing balance support: Single extremity supported;During functional activity;No upper extremity supported Standing balance-Leahy Scale: Poor                             ADL either performed or assessed with clinical judgement   ADL Overall ADL's : Needs assistance/impaired     Grooming: Oral care;Wash/dry face;Minimal assistance;Sitting Grooming Details (indicate cue type and reason): Min A for blocking knees at EOB for safety, holding cup with RUE, and to correct postural changes. Pt demonstrating increased sitting balance.                  Toilet Transfer: Moderate assistance;+2 for safety/equipment;Stand-pivot;BSC Toilet Transfer Details (indicate cue type and reason): Mod A to power up and then cues for reaching LUE towards arm rest.  Toileting- Clothing Manipulation and Hygiene: Maximal assistance;+2 for physical assistance;Sit to/from stand Toileting - Clothing Manipulation Details (indicate cue type and reason): One person for maintaining standing balance and then Max A for toilet hygiene     Functional mobility during ADLs: Maximal assistance;+2 for physical assistance General ADL Comments: Pt performing grooming tasks at EOB, toilet transfer, and then functional mobility (taking four steps). Focused session on challenging balance, cognition, coorindation, and mobility.  Vision   Vision Assessment?: Yes Eye Alignment: Impaired (comment)(dysconjugate gaze) Ocular Range of Motion: Restricted on the right Tracking/Visual Pursuits: Decreased smoothness of horizontal tracking;Decreased  smoothness of vertical tracking;Requires cues, head turns, or add eye shifts to track;Unable to hold eye position out of midline;Decreased smoothness of eye movement to RIGHT superior field;Decreased smoothness of eye movement to RIGHT inferior field Convergence: Impaired (comment)(Disconjugate gaze; unable to bring both eyes to midline.) Visual Fields: Right visual field deficit;Other (comment)(Right neglect/inattention) Diplopia Assessment: Disappears with one eye closed;Objects split side to side;Present all the time/all directions Depth Perception: Undershoots   Perception     Praxis      Cognition Arousal/Alertness: Awake/alert Behavior During Therapy: WFL for tasks assessed/performed Overall Cognitive Status: Difficult to assess Area of Impairment: Attention;Following commands;Safety/judgement;Problem solving                   Current Attention Level: Sustained Memory: Decreased recall of precautions Following Commands: Follows one step commands inconsistently;Follows one step commands with increased time Safety/Judgement: Decreased awareness of safety;Decreased awareness of deficits Awareness: Emergent Problem Solving: Difficulty sequencing;Requires verbal cues;Requires tactile cues General Comments: Pt following simple cues and demonstrating increased awareness. Pt following cues to correct posture at EOB. Pt with flat affect and noting "sad" affect/demeanor. Pt answering yes no questions; saying "thank you" once during session.        Exercises     Shoulder Instructions       General Comments      Pertinent Vitals/ Pain       Pain Assessment: Faces Faces Pain Scale: No hurt Pain Intervention(s): Monitored during session  Home Living                                          Prior Functioning/Environment              Frequency  Min 2X/week        Progress Toward Goals  OT Goals(current goals can now be found in the care plan  section)  Progress towards OT goals: Progressing toward goals  Acute Rehab OT Goals Patient Stated Goal: unable OT Goal Formulation: Patient unable to participate in goal setting Time For Goal Achievement: 05/04/19 Potential to Achieve Goals: Good ADL Goals Pt Will Perform Grooming: with min assist;sitting Pt Will Transfer to Toilet: with max assist;bedside commode;stand pivot transfer Additional ADL Goal #1: pt will complete bed mobility mod (A) as precursor to adls Additional ADL Goal #2: pt will complete 2 step command  Plan Discharge plan remains appropriate    Co-evaluation    PT/OT/SLP Co-Evaluation/Treatment: Yes Reason for Co-Treatment: Complexity of the patient's impairments (multi-system involvement);For patient/therapist safety;To address functional/ADL transfers   OT goals addressed during session: ADL's and self-care      AM-PAC OT "6 Clicks" Daily Activity     Outcome Measure   Help from another person eating meals?: A Lot Help from another person taking care of personal grooming?: A Lot Help from another person toileting, which includes using toliet, bedpan, or urinal?: A Lot Help from another person bathing (including washing, rinsing, drying)?: Total Help from another person to put on and taking off regular upper body clothing?: A Lot Help from another person to put on and taking off regular lower body clothing?: Total 6 Click Score: 10    End of Session Equipment Utilized During Treatment: Gait belt  OT  Visit Diagnosis: Unsteadiness on feet (R26.81);Hemiplegia and hemiparesis Hemiplegia - Right/Left: Right Hemiplegia - dominant/non-dominant: Dominant Hemiplegia - caused by: Cerebral infarction   Activity Tolerance Patient tolerated treatment well   Patient Left in chair;with call bell/phone within reach;with chair alarm set(alarm belt)   Nurse Communication Mobility status        Time: 7471-8550 OT Time Calculation (min): 32 min  Charges: OT  General Charges $OT Visit: 1 Visit OT Treatments $Self Care/Home Management : 8-22 mins $Therapeutic Activity: 8-22 mins  Bonnee Zertuche MSOT, OTR/L Acute Rehab Pager: [phone] Office: [place] 05/02/2019, 5:19 PM

## 2019-05-02 NOTE — Progress Notes (Signed)
Inpatient Diabetes Program Recommendations  AACE/ADA: New Consensus Statement on Inpatient Glycemic Control (2015)  Target Ranges:  Prepandial:   less than 140 mg/dL      Peak postprandial:   less than 180 mg/dL (1-2 hours)      Critically ill patients:  140 - 180 mg/dL   Lab Results  Component Value Date   GLUCAP 132 (H) 05/02/2019   HGBA1C 12.3 (H) 04/15/2019    Review of Glycemic Control  Diabetes history: New Diagnosis Results for Rodney Collier, Rodney Collier (MRN [id number]) as of 05/02/2019 10:52  Ref. Range 04/30/2019 08:54 04/30/2019 12:11 04/30/2019 16:17 04/30/2019 16:40 04/30/2019 21:19 04/30/2019 21:53  Glucose-Capillary Latest Ref Range: 70 - 99 mg/dL 112 (H) 162 (H) 68 (L) 92 66 (L) 109 (H)   Results for Rodney Collier, Rodney Collier (MRN [id number]) as of 05/02/2019 10:52  Ref. Range 05/01/2019 06:11 05/01/2019 11:13 05/01/2019 16:17 05/01/2019 21:09 05/01/2019 21:31 05/02/2019 06:13  Glucose-Capillary Latest Ref Range: 70 - 99 mg/dL 114 (H) 165 (H) 123 (H) 66 (L) 78 132 (H)   Inpatient Diabetes Program Recommendations:     A few mild hypoglycemia occurences On Glipizide 5 mg bid Metformin 1000 mg bid  Decrease Novolog Correction to 0-9 units tid.   Thanks,  Tama Headings RN, MSN, BC-ADM Inpatient Diabetes Coordinator Team Pager [phone] (8a-5p)

## 2019-05-02 NOTE — Progress Notes (Addendum)
Physical Therapy Treatment Patient Details Name: Rodney SinclairSamuel Collier MRN: [id number] DOB: 11-25-1965 Today's Date: 05/02/2019    History of Present Illness Pt is a 53 y.o. M with no known PMH who presents with right sided weakness, decreased sensation on right, diplopia and headache. CT showing remote L frontal infarcts, CTA head/neck with L P2 occlusion, MRI with acute L PCA infarct including much of L thalamus, L > R cerebellum, R pons. Now s/p complete revascularization of occluded L PCA with mechanical thrombectomy. ETT 8/27-8/28; self extubated 8/28.    PT Comments    Pt seems very flat.  Limited participation in sit to stand and weight shifting in standing from recliner chair up to the standing frame.  Pt seems frustrated.  PT will continue to follow acutely for safe mobility progression.  Goals updated.   Follow Up Recommendations  Supervision/Assistance - 24 hour;SNF (CIR states he will need longer term rehab and has signed off).     Equipment Recommendations  Wheelchair (measurements PT);Wheelchair cushion (measurements PT);3in1 (PT);Hospital bed    Recommendations for Other Services   NA     Precautions / Restrictions Precautions Precautions: Fall Precaution Comments: R hemiparesis and inattention Restrictions Weight Bearing Restrictions: No    Mobility  Bed Mobility Overal bed mobility: Needs Assistance Bed Mobility: Rolling;Sidelying to Sit Rolling: Min guard Sidelying to sit: Min assist;HOB elevated       General bed mobility comments: Pt was OOB in the recliner chair.   Transfers Overall transfer level: Needs assistance Equipment used: Ambulation equipment used Transfers: Sit to/from Stand Sit to Stand: Mod assist Stand pivot transfers: Mod assist;+2 physical assistance       General transfer comment: Mod assist to come to standing from recliner chair up to steady, hand over hand assist for L hand placement on appropriate bar, and assist to power up to stand.   Significant assist needed to control descent to sit as pt just let go of the bar on the steady and fell straight back on the first attempt without bending at the knees or hips. Second stand with controlled descent, pt refused to stand after second attempt seemingly giving up (he seems very depressed)  Ambulation/Gait             General Gait Details: unable at this time.       Modified Rankin (Stroke Patients Only) Modified Rankin (Stroke Patients Only) Pre-Morbid Rankin Score: No symptoms Modified Rankin: Severe disability     Balance Overall balance assessment: Needs assistance Sitting-balance support: Feet supported;Single extremity supported Sitting balance-Leahy Scale: Poor Sitting balance - Comments: needs external support in sitting from his left arm to maintain trunk off of chair.    Standing balance support: Single extremity supported Standing balance-Leahy Scale: Poor Standing balance comment: Mod assist in standing with the support of the steady standing frame to lock out his right leg.  Practiced weight shifting, upright posture, and slow transitions.                             Cognition Arousal/Alertness: Awake/alert Behavior During Therapy: Flat affect;Impulsive Overall Cognitive Status: Difficult to assess Area of Impairment: Attention;Following commands;Safety/judgement;Problem solving                   Current Attention Level: Sustained Memory: Decreased recall of precautions Following Commands: Follows one step commands inconsistently Safety/Judgement: Decreased awareness of safety;Decreased awareness of deficits Awareness: Emergent Problem Solving: Difficulty sequencing;Requires verbal cues;Requires  tactile cues General Comments: Pt very flat, at one point not wanting to continue practicing standing.           General Comments General comments (skin integrity, edema, etc.): Classical music playing when I entered the room.  I kept  the music on throughout the session.       Pertinent Vitals/Pain Pain Assessment: Faces Pain Score: 0-No pain Faces Pain Scale: No hurt Pain Intervention(s): Monitored during session           PT Goals (current goals can now be found in the care plan section) Acute Rehab PT Goals Patient Stated Goal: unable PT Goal Formulation: Patient unable to participate in goal setting Time For Goal Achievement: 05/16/19 Potential to Achieve Goals: Good Progress towards PT goals: Progressing toward goals    Frequency    Min 4X/week      PT Plan Current plan remains appropriate    Co-evaluation   Reason for Co-Treatment: Complexity of the patient's impairments (multi-system involvement);For patient/therapist safety;To address functional/ADL transfers   OT goals addressed during session: ADL's and self-care      AM-PAC PT "6 Clicks" Mobility   Outcome Measure  Help needed turning from your back to your side while in a flat bed without using bedrails?: A Lot Help needed moving from lying on your back to sitting on the side of a flat bed without using bedrails?: A Lot Help needed moving to and from a bed to a chair (including a wheelchair)?: A Lot Help needed standing up from a chair using your arms (e.g., wheelchair or bedside chair)?: A Lot Help needed to walk in hospital room?: Total Help needed climbing 3-5 steps with a railing? : Total 6 Click Score: 10    End of Session Equipment Utilized During Treatment: Gait belt Activity Tolerance: Patient limited by fatigue Patient left: in chair;with call bell/phone within reach;with chair alarm set Nurse Communication: Need for lift equipment;Mobility status(use steady to [employer]) PT Visit Diagnosis: Other abnormalities of gait and mobility (R26.89);Other symptoms and signs involving the nervous system (R29.898) Hemiplegia - Right/Left: Right Hemiplegia - dominant/non-dominant: Dominant Hemiplegia - caused by: Cerebral  infarction     Time: 5409-8119 PT Time Calculation (min) (ACUTE ONLY): 22 min  Charges:  $Therapeutic Activity: 8-22 mins          ]   Wells Guiles B. Natavia Sublette, PT, DPT  Acute Rehabilitation [phone] pager [phone] office  @ [place]: [phone]            05/02/2019, 5:57 PM

## 2019-05-02 NOTE — Progress Notes (Signed)
STROKE TEAM PROGRESS NOTE   INTERVAL HISTORY Pt lying in bed, no neuro changes. Pending for SNF. No complaints. Vital signs stable  Vitals:   05/02/19 0808 05/02/19 1055 05/02/19 1639 05/02/19 1952  BP: 135/87 119/89 134/89 125/78  Pulse: 86 92 (!) 104 (!) 103  Resp: 20 16 16 16   Temp: 98.2 F (36.8 C) 98.2 F (36.8 C) 99.2 F (37.3 C) 98.6 F (37 C)  TempSrc: Oral Oral Oral Oral  SpO2: 100% 98% 98% 98%  Weight:      Height:        CBC:  Recent Labs  Lab 04/28/19 0326 04/30/19 1930  WBC 6.3 7.0  HGB 12.1* 12.4*  HCT 38.2* 39.1  MCV 72.8* 72.0*  PLT 477* 491*    Basic Metabolic Panel:  Recent Labs  Lab 04/28/19 0326 04/30/19 1930  NA 136 135  K 3.9 3.9  CL 101 102  CO2 25 20*  GLUCOSE 124* 101*  BUN 13 10  CREATININE 0.83 0.75  CALCIUM 9.3 9.3    IMAGING  CT Head WO Contrast 04/16/2019 Areas of acute infarction are showing low-density and swelling but no macroscopic hemorrhage by CT. As shown by previous MRI, there is involvement of the cerebellum, left cerebral peduncle and thalamus and left posteromedial temporal lobe and occipital lobe. Tiny focus of infarction in the right occipital lobe as well.  [name] 04/15/2019 1. Acute infarction throughout the left PCA territory (including most of the left thalamus), left more than right cerebellum, and right pons. Petechial hemorrhage is seen at the level of the pons.  2. Prior left frontal infarcts and microvascular ischemic gliosis.   Cerebral angiogram 04/14/2019 4 vessel cerebral arteriogram followed by complete revascularization of occluded Lt PCA  With x 2 passes with 88mm x 39mm embotrap retriever  device achieving a TICI 3 revascularization.  Ct Code Stroke Cta Head W/wo Contrast Ct Code Stroke Cta Neck W/wo Contrast Ct Code Stroke Cta Cerebral Perfusion W/wo Contrast 04/14/2019 1. Left P2 occlusion with downstream reconstitution. Qualitative assessment of CT perfusion maps shows ischemia  without visible core infarct in the left occipital lobe.  2. Mild atherosclerosis in the neck without flow limiting stenosis or embolic source seen.  3. No large vessel occlusion in the anterior circulation.  4. Mild mid basilar narrowing.   Ct Head Code Stroke Wo Contrast 04/14/2019 1. No acute finding.  2. Small remote appearing infarcts in the left frontal lobe.    LE Doppler 04/15/2019 Right: There is no evidence of deep vein thrombosis in the lower extremity. No cystic structure found in the popliteal fossa. Left: There is no evidence of deep vein thrombosis in the lower extremity. No cystic structure found in the popliteal fossa.  2D Echocardiogram 04/15/2019  1. The left ventricle has normal systolic function, with an ejection fraction of 55-60%. The cavity size was normal. Left ventricular diastolic parameters were normal. No evidence of left ventricular regional wall motion abnormalities.  2. The right ventricle has normal systolic function. The cavity was normal. There is no increase in right ventricular wall thickness.  3. The mitral valve is abnormal. Mild thickening of the mitral valve leaflet. The MR jet is posteriorly-directed.  4. The tricuspid valve is grossly normal.  5. The aortic valve is tricuspid. No stenosis of the aortic valve.  6. The aorta is normal unless otherwise noted.  7. The inferior vena cava was normal in size with <50% respiratory variability. SUMMARY LVEF 55-60%, normal  wall thickness, normal wall motion, normal diastolic function, mild posteriorly directed MR, normal size IVC that does not collapse, no PFO by color doppler  FINDINGS  Left Ventricle: The left ventricle has normal systolic function, with an ejection fraction of 55-60%. The cavity size was normal. There is no increase in left ventricular wall thickness. Left ventricular diastolic parameters were normal. Normal left ventricular filling pressures No evidence of left ventricular regional wall  motion abnormalities..  EEG  04/15/2019 This study is suggestive of cortical dysfunction in the left hemisphere secondary to underlying stroke as well as mild diffuse encephalopathy. No seizures or definite epileptiform discharges were seen throughout the recording.  TCD w/ bubble 04/18/2019  No HTIS heard during rest. No HITS heard during valsalva.  TEE 04/19/2019 LVEF 55-60% No LA/LAA thrombus or mass No ASD or PFO by color flow Doppler or saline microcavitation study. Mild AR, mild MR.   PHYSICAL EXAM    General - Well nourished, well developed, African gentleman who is not in distress  Ophthalmologic - fundi not visualized due to noncooperation.  Cardiovascular - Regular rate and rhythm.  Neuro - awake, alert, eyes open, following most of the simple commands. Anarthria.  Dysconjugate eyes, left eye CN III palsy with pupil sparing, right eye CN VI palsy, PERRL. Not blinking to visual threat on the right. Right facial droop, tongue protrusion not cooperative, against gravity of LUE but significant ataxia, able to follow simple commands with left hand. lifts left leg slightly off of the bed on command, follow simple commands of the left foot. On pain stimulation, slight withdraw of RLE but no movement of RUE. Increased muscle tone on the RUE. Positive triple reflex on the right. Sensation and gait not tested.   ASSESSMENT/PLAN [name] with no significant past medical history presenting with right-sided weakness, decreased sensation on the right, diplopia and headache. Complete revascularization of occluded L PCA w/ mechanical thrombectomy.  Stroke: embolic multifocal posterior circulation infarcts with left P2 occlusion s/p IR with TICI3 reperfusion - likely embolic secondary to hypercoagulability secondary to metabolic syndrome  Code Stroke CT head No acute abnormality. Old infarct L frontal lobe.   CTA head & neck L P2 occlusion. Mild neck atherosclerosis  . No LVO anterior circulation. Mild mid BA narrowing.  CT perfusion ischemia w/o visible core  CT Head WO Contrast - 04/16/19 - Areas of acute infarction are showing low-density and swelling but no macroscopic hemorrhage by CT.  As shown by previous MRI, there is involvement of the cerebellum, left cerebral peduncle and thalamus and left posteromedial temporal lobe and occipital lobe. Tiny focus of infarction in the right occipital lobe as well.  Cerebral angio TICI reperfusion of occluded L PCA  MRI  L PCA (including thalamus), L>R cerebellum, R pontine and left midbrain infarct. Petechial hemorrhage seen in pons. Old L frontal infarcts.   LE Doppler no DVT - no evidence of deep vein thrombosis   2D Echo - EF 55-60%.  No cardiac source of emboli identified.   TCD bubble no HITS  TEE neg  UDS - pos for benzos - UDS done on 9/1  EEG - cortical dysfunction but no seizures.  LDL 88  HgbA1c 12.3  Lovenox 40 mg sq daily  for VTE prophylaxis  No antithrombotic prior to admission, now on aspirin 81 mg daily and clopidogrel 75 mg daily x 3 weeks, then aspirin alone. (Plavix started 04/21/19 - last dose 05/12/19)  Therapy recommendations:  CIR -> D/t lack of post hospitalization family support - will pursue SNF - will need rapid covid testing prior to D/C  Medically ready for d/c when bed found  Disposition:  pending    Possible seizure  Full body rigid shaking hours post IR, L>R  Loaded with keppra  EEG - cortical dysfunction but no seizures.  Continue keppra 500mg  bid  Hypertension  No hx of HTN, on no home meds  Placed On cleviprex gtt for BP control post IR, now off . On amlodipine 10  . BP 130s  . Long-term BP goal normotensive . BP still running a little too high. Will add ACE I (Lotensin) 05/01/2019  Hyperlipidemia  Home meds:  No statin  LDL 88, goal < 70  HDL 39  Add lipitor 20  Continue statin at discharge  Diabetes type II, new diagnosis,  Uncontrolled Hyperglycemia, improving  HgbA1c 12.3, goal < 7.0  CBGs  SSI   Put on metformin 850 bid -> increase to 1000mg  bid  DM coordinator consulted - had placed on levemir 5U bid - now recommends Metformin 850 mg bid and Glipizide 5 mg bid outpatient for glucose control. Made adjustment prior to d/c.  Elevated FBS this am. Consider increase of glipizde to 10 mg bid should glucoses remain elevated. Hold metformin titration d.t GI SE. May benefit from SGLT-2inhibitior if he gets insurance   Likely hypercoagulable state associated with uncontrolled DM  Dysphagia . Secondary to stroke . Speech on board . cleared for Dysphagia 3 nectar thick liquids with MBS-> D2 nectar thick . Encourage po intake . Continue IVF @ 41 -  d/c on discharge  UTI with fever  UA showed WBC > 50  Urine culture enterobactor and staph coag neg  Sensitive to cipro  Finished cipro 500mg  bid course (ends 9/8)  Fever resolved   Other Stroke Risk Factors   hx of stroke - small remote appearing infarcts in the left frontal lobe by CT and MRI  Other Active Problems  Microcytic anemia - iron 38, Ferritin 115 - put on iron tab bid for 14 days.   Pt found on floor 04/23/19 by nursing staff. No obvious injury, complaints of pain, or neurologic changes.   Hospital day # 18  Patient medically stable to be transferred to SNF next week and social work is working on mode of transportation. Delia Heady, MD  To contact Stroke Continuity provider, please refer to [email or web address]. After hours, contact General Neurology

## 2019-05-03 LAB — GLUCOSE, CAPILLARY
Glucose-Capillary: 97 mg/dL (ref 70–99)
Glucose-Capillary: 66 mg/dL — ABNORMAL LOW (ref 70–99)
Glucose-Capillary: 68 mg/dL — ABNORMAL LOW (ref 70–99)
Glucose-Capillary: 80 mg/dL (ref 70–99)
Glucose-Capillary: 115 mg/dL — ABNORMAL HIGH (ref 70–99)
Glucose-Capillary: 52 mg/dL — ABNORMAL LOW (ref 70–99)
Glucose-Capillary: 138 mg/dL — ABNORMAL HIGH (ref 70–99)

## 2019-05-03 MED ORDER — INSULIN ASPART 100 UNIT/ML ~~LOC~~ SOLN
0.0000 [IU] | Freq: Every day | SUBCUTANEOUS | Status: DC
  Administered 2019-05-12: 3 [IU] via SUBCUTANEOUS
  Administered 2019-05-13 – 2019-05-16 (×2): 2 [IU] via SUBCUTANEOUS
  Administered 2019-05-21: 3 [IU] via SUBCUTANEOUS
  Administered 2019-05-22: 22:00:00 2 [IU] via SUBCUTANEOUS

## 2019-05-03 MED ORDER — DEXTROSE 50 % IV SOLN
INTRAVENOUS | Status: AC
  Administered 2019-05-03: 22:00:00 50 mL
  Filled 2019-05-03: qty 50

## 2019-05-03 MED ORDER — INSULIN ASPART 100 UNIT/ML ~~LOC~~ SOLN
0.0000 [IU] | Freq: Three times a day (TID) | SUBCUTANEOUS | Status: DC
  Administered 2019-05-04: 13:00:00 2 [IU] via SUBCUTANEOUS
  Administered 2019-05-05 (×2): 1 [IU] via SUBCUTANEOUS
  Administered 2019-05-05: 12:00:00 3 [IU] via SUBCUTANEOUS
  Administered 2019-05-06: 18:00:00 1 [IU] via SUBCUTANEOUS
  Administered 2019-05-06: 09:00:00 3 [IU] via SUBCUTANEOUS
  Administered 2019-05-07 – 2019-05-08 (×5): 1 [IU] via SUBCUTANEOUS
  Administered 2019-05-09: 18:00:00 2 [IU] via SUBCUTANEOUS
  Administered 2019-05-09 (×2): 1 [IU] via SUBCUTANEOUS
  Administered 2019-05-10: 2 [IU] via SUBCUTANEOUS
  Administered 2019-05-10: 06:00:00 1 [IU] via SUBCUTANEOUS
  Administered 2019-05-10: 13:00:00 3 [IU] via SUBCUTANEOUS
  Administered 2019-05-11 – 2019-05-14 (×10): 2 [IU] via SUBCUTANEOUS
  Administered 2019-05-14: 17:00:00 3 [IU] via SUBCUTANEOUS
  Administered 2019-05-15: 17:00:00 1 [IU] via SUBCUTANEOUS
  Administered 2019-05-15: 07:00:00 2 [IU] via SUBCUTANEOUS
  Administered 2019-05-15: 13:00:00 3 [IU] via SUBCUTANEOUS
  Administered 2019-05-16 – 2019-05-17 (×3): 2 [IU] via SUBCUTANEOUS
  Administered 2019-05-17: 12:00:00 3 [IU] via SUBCUTANEOUS
  Administered 2019-05-18 – 2019-05-19 (×4): 2 [IU] via SUBCUTANEOUS
  Administered 2019-05-19: 3 [IU] via SUBCUTANEOUS
  Administered 2019-05-19: 07:00:00 2 [IU] via SUBCUTANEOUS
  Administered 2019-05-20: 12:00:00 5 [IU] via SUBCUTANEOUS
  Administered 2019-05-21 (×3): 2 [IU] via SUBCUTANEOUS
  Administered 2019-05-22 (×2): 3 [IU] via SUBCUTANEOUS
  Administered 2019-05-22: 2 [IU] via SUBCUTANEOUS
  Administered 2019-05-23: 3 [IU] via SUBCUTANEOUS

## 2019-05-03 NOTE — Progress Notes (Signed)
STROKE TEAM PROGRESS NOTE   INTERVAL HISTORY Patient remains neurologically unchanged.  Is lying comfortably in bed.  He has no complaints.  Vital signs remained stable.  Awaiting nursing home placement but bed not available yet as per social work  Vitals:   05/02/19 2328 05/03/19 0351 05/03/19 0959 05/03/19 1159  BP: 115/71 (!) 142/86 (!) 137/94 (!) 145/85  Pulse: 97 86 80 (!) 101  Resp: 16 16 16 16   Temp: 98.9 F (37.2 C) 98.9 F (37.2 C) 98.5 F (36.9 C) 98.7 F (37.1 C)  TempSrc: Oral Oral Oral Oral  SpO2: 99% 100% 99% 97%  Weight:      Height:        CBC:  Recent Labs  Lab 04/28/19 0326 04/30/19 1930  WBC 6.3 7.0  HGB 12.1* 12.4*  HCT 38.2* 39.1  MCV 72.8* 72.0*  PLT 477* 491*    Basic Metabolic Panel:  Recent Labs  Lab 04/28/19 0326 04/30/19 1930  NA 136 135  K 3.9 3.9  CL 101 102  CO2 25 20*  GLUCOSE 124* 101*  BUN 13 10  CREATININE 0.83 0.75  CALCIUM 9.3 9.3    IMAGING  CT Head WO Contrast 04/16/2019 Areas of acute infarction are showing low-density and swelling but no macroscopic hemorrhage by CT. As shown by previous MRI, there is involvement of the cerebellum, left cerebral peduncle and thalamus and left posteromedial temporal lobe and occipital lobe. Tiny focus of infarction in the right occipital lobe as well.  [name] 04/15/2019 1. Acute infarction throughout the left PCA territory (including most of the left thalamus), left more than right cerebellum, and right pons. Petechial hemorrhage is seen at the level of the pons.  2. Prior left frontal infarcts and microvascular ischemic gliosis.   Cerebral angiogram 04/14/2019 4 vessel cerebral arteriogram followed by complete revascularization of occluded Lt PCA  With x 2 passes with 5mm x 33mm embotrap retriever  device achieving a TICI 3 revascularization.  Ct Code Stroke Cta Head W/wo Contrast Ct Code Stroke Cta Neck W/wo Contrast Ct Code Stroke Cta Cerebral Perfusion W/wo  Contrast 04/14/2019 1. Left P2 occlusion with downstream reconstitution. Qualitative assessment of CT perfusion maps shows ischemia without visible core infarct in the left occipital lobe.  2. Mild atherosclerosis in the neck without flow limiting stenosis or embolic source seen.  3. No large vessel occlusion in the anterior circulation.  4. Mild mid basilar narrowing.   Ct Head Code Stroke Wo Contrast 04/14/2019 1. No acute finding.  2. Small remote appearing infarcts in the left frontal lobe.    LE Doppler 04/15/2019 Right: There is no evidence of deep vein thrombosis in the lower extremity. No cystic structure found in the popliteal fossa. Left: There is no evidence of deep vein thrombosis in the lower extremity. No cystic structure found in the popliteal fossa.  2D Echocardiogram 04/15/2019  1. The left ventricle has normal systolic function, with an ejection fraction of 55-60%. The cavity size was normal. Left ventricular diastolic parameters were normal. No evidence of left ventricular regional wall motion abnormalities.  2. The right ventricle has normal systolic function. The cavity was normal. There is no increase in right ventricular wall thickness.  3. The mitral valve is abnormal. Mild thickening of the mitral valve leaflet. The MR jet is posteriorly-directed.  4. The tricuspid valve is grossly normal.  5. The aortic valve is tricuspid. No stenosis of the aortic valve.  6. The aorta is normal  unless otherwise noted.  7. The inferior vena cava was normal in size with <50% respiratory variability. SUMMARY LVEF 55-60%, normal wall thickness, normal wall motion, normal diastolic function, mild posteriorly directed MR, normal size IVC that does not collapse, no PFO by color doppler  FINDINGS  Left Ventricle: The left ventricle has normal systolic function, with an ejection fraction of 55-60%. The cavity size was normal. There is no increase in left ventricular wall thickness. Left  ventricular diastolic parameters were normal. Normal left ventricular filling pressures No evidence of left ventricular regional wall motion abnormalities..  EEG  04/15/2019 This study is suggestive of cortical dysfunction in the left hemisphere secondary to underlying stroke as well as mild diffuse encephalopathy. No seizures or definite epileptiform discharges were seen throughout the recording.  TCD w/ bubble 04/18/2019  No HTIS heard during rest. No HITS heard during valsalva.  TEE 04/19/2019 LVEF 55-60% No LA/LAA thrombus or mass No ASD or PFO by color flow Doppler or saline microcavitation study. Mild AR, mild MR.   PHYSICAL EXAM    General - Well nourished, well developed, African gentleman who is not in distress  Ophthalmologic - fundi not visualized due to noncooperation.  Cardiovascular - Regular rate and rhythm.  Neuro - awake, alert, eyes open, following most of the simple commands. Anarthria.  Dysconjugate eyes, left eye CN III palsy with pupil sparing, right eye CN VI palsy, PERRL. Not blinking to visual threat on the right. Right facial droop, tongue protrusion not cooperative, against gravity of LUE but significant ataxia, able to follow simple commands with left hand. lifts left leg slightly off of the bed on command, follow simple commands of the left foot. On pain stimulation, slight withdraw of RLE but no movement of RUE. Increased muscle tone on the RUE. Positive triple reflex on the right. Sensation and gait not tested.   ASSESSMENT/PLAN [name] with no significant past medical history presenting with right-sided weakness, decreased sensation on the right, diplopia and headache. Complete revascularization of occluded L PCA w/ mechanical thrombectomy.   Stroke: embolic multifocal posterior circulation infarcts with left P2 occlusion s/p IR with TICI3 reperfusion - likely embolic   Code Stroke CT head No acute abnormality. Old infarct L  frontal lobe.   CTA head & neck L P2 occlusion. Mild neck atherosclerosis . No LVO anterior circulation. Mild mid BA narrowing.  CT perfusion ischemia w/o visible core  CT Head WO Contrast - 04/16/19 - Areas of acute infarction are showing low-density and swelling but no macroscopic hemorrhage by CT.  As shown by previous MRI, there is involvement of the cerebellum, left cerebral peduncle and thalamus and left posteromedial temporal lobe and occipital lobe. Tiny focus of infarction in the right occipital lobe as well.  Cerebral angio TICI reperfusion of occluded L PCA  MRI  L PCA (including thalamus), L>R cerebellum, R pontine and left midbrain infarct. Petechial hemorrhage seen in pons. Old L frontal infarcts.   LE Doppler no DVT - no evidence of deep vein thrombosis   2D Echo - EF 55-60%.  No cardiac source of emboli identified.   TCD bubble no HITS  TEE neg  UDS - pos for benzos - UDS done on 9/1  EEG - cortical dysfunction but no seizures.  LDL 88  HgbA1c 12.3  Lovenox 40 mg sq daily  for VTE prophylaxis  No antithrombotic prior to admission, now on aspirin 81 mg daily and clopidogrel 75 mg daily  x 3 weeks, then aspirin alone. (Plavix started 04/21/19 - last dose 05/12/19)  Therapy recommendations:  CIR -> D/t lack of post hospitalization family support - will pursue SNF - will need rapid covid testing prior to D/C  Medically ready for d/c when bed found  Disposition:  pending    Possible seizure  Full body rigid shaking hours post IR, L>R  Loaded with keppra  EEG - cortical dysfunction but no seizures.  Continue keppra 500mg  bid  Hypertension  No hx of HTN, on no home meds  Placed On cleviprex gtt for BP control post IR, now off . On amlodipine 10, lotensin 5  . BP 130s  . BP goal normotensive  Hyperlipidemia  Home meds:  No statin  LDL 88, goal < 70  HDL 39  On lipitor 20  Continue statin at discharge  Diabetes type II, new diagnosis,  Uncontrolled Hyperglycemia, improving  HgbA1c 12.3, goal < 7.0  CBGs  on metformin 1000mg  bid on glipizde to 5 mg bid   DM coordinator consulted - should glucoses remain elevated. May benefit from SGLT-2inhibitior if he gets insurance   Hypoglycemic episode during the night  SSI - decreased to 0-9 scale  Likely hypercoagulable state associated with uncontrolled DM  Dysphagia . Secondary to stroke . Speech on board . cleared for Dysphagia 3 nectar thick liquids with MBS-> D2 nectar thick . Encourage po intake . Continue IVF @ 50 -  d/c on discharge  UTI with fever, resolved  UA showed WBC > 50  Urine culture enterobactor and staph coag neg  Sensitive to cipro  Finished cipro 500mg  bid course (ends 9/8)  Fever resolved   Other Stroke Risk Factors   hx of stroke - small remote appearing infarcts in the left frontal lobe by CT and MRI  Other Active Problems  Microcytic anemia - iron 38, Ferritin 115 - put on iron tab bid for 14 days.   Pt found on floor 04/23/19 by nursing staff. No obvious injury, complaints of pain, or neurologic changes.   Hospital day # 19  Continue present treatment.  Await SNF bed  Antony Contras, MD  To contact Stroke Continuity provider, please refer to [email or web address] After hours, contact General Neurology

## 2019-05-03 NOTE — Progress Notes (Signed)
Physical Therapy Treatment Patient Details Name: Rodney SinclairSamuel Collier MRN: [id number] DOB: 25-Apr-1966 Today's Date: 05/03/2019    History of Present Illness Pt is a 53 y.o. M with no known PMH who presents with right sided weakness, decreased sensation on right, diplopia and headache. CT showing remote L frontal infarcts, CTA head/neck with L P2 occlusion, MRI with acute L PCA infarct including much of L thalamus, L > R cerebellum, R pons. Now s/p complete revascularization of occluded L PCA with mechanical thrombectomy. ETT 8/27-8/28; self extubated 8/28.    PT Comments    Pt worked on crossing midline with gaze and attending to R side. 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 Pt required mod A for transfers with use of stedy. Continue to recommend intense rehab for pt. PT will continue to follow.    Follow Up Recommendations  Supervision/Assistance - 24 hour;SNF     Equipment Recommendations  Wheelchair (measurements PT);Wheelchair cushion (measurements PT);3in1 (PT);Hospital bed    Recommendations for Other Services Rehab consult     Precautions / Restrictions Precautions Precautions: Fall Precaution Comments: R hemiparesis and inattention Restrictions Weight Bearing Restrictions: No    Mobility  Bed Mobility Overal bed mobility: Needs Assistance Bed Mobility: Rolling;Sidelying to Sit Rolling: Mod assist Sidelying to sit: HOB elevated;Mod assist       General bed mobility comments: pt able to initiate rolling with torso, mod A for LE's off bed, mod A for elevation of trunk into sitting  Transfers Overall transfer level: Needs assistance Equipment used: Ambulation equipment used Transfers: Sit to/from Stand Sit to Stand: Mod assist;+2 safety/equipment;Min assist         General transfer comment: mod A to stand from bed. Min A to stand from stedy, numerous reps. Min A needed to control descent to chair. Worked on [employer]ing through LUE on stedy in standing  Ambulation/Gait              General Gait Details: unable at this time.    Stairs             Wheelchair Mobility    Modified Rankin (Stroke Patients Only) Modified Rankin (Stroke Patients Only) Pre-Morbid Rankin Score: No symptoms Modified Rankin: Severe disability     Balance Overall balance assessment: Needs assistance Sitting-balance support: Feet supported;Single extremity supported Sitting balance-Leahy Scale: Poor Sitting balance - Comments: able to maintain sitting with strong hold on footboard of bed Postural control: Right lateral lean;Posterior lean Standing balance support: Single extremity supported Standing balance-Leahy Scale: Poor Standing balance comment: Mod assist in standing with the support of the steady standing frame to lock out his right leg.  Practiced midline standing in front of mirror. Pt attended to self more with practice.                             Cognition Arousal/Alertness: Awake/alert Behavior During Therapy: Flat affect Overall Cognitive Status: Difficult to assess Area of Impairment: Attention;Following commands;Safety/judgement;Problem solving                 Orientation Level: Place;Time;Situation Current Attention Level: Sustained Memory: Decreased recall of precautions Following Commands: Follows one step commands inconsistently Safety/Judgement: Decreased awareness of safety;Decreased awareness of deficits Awareness: Emergent Problem Solving: Difficulty sequencing;Requires verbal cues;Requires tactile cues General Comments: pt continues to be very flat but did participate throughout session and gave effort to eating once up in chair.       Exercises  General Comments General comments (skin integrity, edema, etc.): once in recliner, worked on self feeding. Pt checks R cheek with finger when cued. Will only eat about 4 bites before he wants to stop. RN coming in to encourage more after a break      Pertinent  Vitals/Pain Pain Assessment: No/denies pain Faces Pain Scale: No hurt    Home Living                      Prior Function            PT Goals (current goals can now be found in the care plan section) Acute Rehab PT Goals Patient Stated Goal: unable PT Goal Formulation: Patient unable to participate in goal setting Time For Goal Achievement: 05/16/19 Potential to Achieve Goals: Good Progress towards PT goals: Progressing toward goals    Frequency    Min 4X/week      PT Plan Current plan remains appropriate    Co-evaluation              AM-PAC PT "6 Clicks" Mobility   Outcome Measure  Help needed turning from your back to your side while in a flat bed without using bedrails?: A Lot Help needed moving from lying on your back to sitting on the side of a flat bed without using bedrails?: A Lot Help needed moving to and from a bed to a chair (including a wheelchair)?: A Lot Help needed standing up from a chair using your arms (e.g., wheelchair or bedside chair)?: A Lot Help needed to walk in hospital room?: Total Help needed climbing 3-5 steps with a railing? : Total 6 Click Score: 10    End of Session Equipment Utilized During Treatment: Gait belt Activity Tolerance: Patient tolerated treatment well Patient left: in chair;with call bell/phone within reach;with chair alarm set Nurse Communication: Need for lift equipment;Mobility status(use steady to Engineer, manufacturing) PT Visit Diagnosis: Other abnormalities of gait and mobility (R26.89);Other symptoms and signs involving the nervous system (R29.898) Hemiplegia - Right/Left: Right Hemiplegia - dominant/non-dominant: Dominant Hemiplegia - caused by: Cerebral infarction     Time: 0981-1914 PT Time Calculation (min) (ACUTE ONLY): 32 min  Charges:  $Gait Training: 8-22 mins $Therapeutic Exercise: 8-22 mins                     Morris  Pager [phone] Office  [place] 05/03/2019, 4:45 PM

## 2019-05-03 NOTE — Progress Notes (Signed)
  Speech Language Pathology Treatment: Dysphagia, cognitive-linguistic Patient Details Name: Rodney Collier MRN: [id number] DOB: 1965-08-30 Today's Date: 05/03/2019 Time: 1550-1610 SLP Time Calculation (min) (ACUTE ONLY): 20 min  Assessment / Plan / Recommendation Clinical Impression  Pt was seen at bedside for skilled ST intervention targeting goals for swallow safety, improved communication and attention. RN reports pt's po intake is variable, but he appears to tolerate current diet of Dys 2 solids and nectar thick liquids. Cues needed to clear right oral cavity. No overt s/s aspiration observed on any consistency. Pt was able to attend well to po trials. Environment was quiet and non-distracting. Pt follows 1-step verbal commands. Pt verbalizations are at whisper level. Intelligibility is reduced due to rapid speech rate and low intensity. Pt uses gestures effectively to communicate wants/needs, such as being finished with lunch or wanting to return to bed. Pt is unable to repeat functional phrases at this time. Goals updated. Recommend continued skilled ST intervention acutely and at next venue.    HPI HPI: Pt is a 53 y.o. M with no known PMH who presents with right sided weakness, decreased sensation on right, diplopia and headache. CT showing remote L frontal infarcts, CTA head/neck with L P2 occlusion, MRI with acute L PCA infarct including much of L thalamus, L > R cerebellum, R pons. Now s/p complete revascularization of occluded L PCA with mechanical thrombectomy. ETT 8/27-8/28; self extubated 8/28.      SLP Plan  Continue with current plan of care;Goals updated       Recommendations  Diet recommendations: Dysphagia 2 (fine chop);Nectar-thick liquid Liquids provided via: Cup Medication Administration: Crushed with puree Supervision: Patient able to self feed;Full supervision/cueing for compensatory strategies;Staff to assist with self feeding Compensations: Minimize environmental  distractions;Small sips/bites;Slow rate;Lingual sweep for clearance of pocketing Postural Changes and/or Swallow Maneuvers: Seated upright 90 degrees;Upright 30-60 min after meal                Oral Care Recommendations: Oral care BID Follow up Recommendations: Inpatient Rehab;24 hour supervision/assistance SLP Visit Diagnosis: Aphasia (R47.01);Apraxia (R48.2);Dysphagia, oropharyngeal phase (R13.12) Plan: Continue with current plan of care;Goals updated       Rodney Collier [facility], CCC-SLP Speech Language Pathologist [phone]  Rodney Collier 05/03/2019, 4:10 PM

## 2019-05-03 NOTE — Plan of Care (Signed)
Patient does not understanding why he is here.

## 2019-05-03 NOTE — Progress Notes (Signed)
Patient with no urine output in past 12 hours.  Bladder scan of 505 ml.  Paged provider on call.  Advised to urge patient to urinate   RN will continue to monitor patient

## 2019-05-03 NOTE — Progress Notes (Signed)
Hypoglycemic Event  CBG: 66  Treatment: 4oz juice per protocol Symptoms: asymptomatic  Follow-up CBG: Time:1251 CBG Result:80  Possible Reasons for Event: decreased appetite, will encourage po intake at and between meals    Rodney Collier T Judi Jaffe

## 2019-05-03 NOTE — Progress Notes (Signed)
Nutrition Follow-up  DOCUMENTATION CODES:   Not applicable  INTERVENTION:  Continue Glucerna Shake po TID (thickened to nectar thick consistency), each supplement provides 220 kcal and 10 grams of protein  Encourage adequate PO intake.   NUTRITION DIAGNOSIS:   Inadequate oral intake related to inability to eat as evidenced by NPO status; diet advanced; improving  GOAL:   Patient will meet greater than or equal to 90% of their needs; progressing  MONITOR:   PO intake, Supplement acceptance, Diet advancement, Skin, Weight trends, Labs, I & O's  REASON FOR ASSESSMENT:   Ventilator    ASSESSMENT:   Rodney Collier is a 53 y.o. male with no significant past medical history presenting with right-sided weakness, decreased sensation on the right, diplopia and headache. Complete revascularization of occluded L PCA w/ mechanical thrombectomy. Self extubated 8/28.TEE9/1which showed no PFO/clot or cardiac source of aneurysm.   Pt continues on a dysphagia 2 diet with nectar thick liquids. Meal completion has been 40-80%. Pt currently has Glucerna shake ordered and has been consuming them. RD to continue with current orders to aid in caloric and protein needs. SNF placement pending.   Labs and medications reviewed.   Diet Order:   Diet Order            DIET DYS 2 Room service appropriate? No; Fluid consistency: Nectar Thick  Diet effective now              EDUCATION NEEDS:   Not appropriate for education at this time  Skin:  Skin Assessment: Reviewed RN Assessment Skin Integrity Issues:: Other (Comment) Other: lt thigh puncture wound  Last BM:  9/13  Height:   Ht Readings from Last 1 Encounters:  04/19/19 6' (1.829 m)    Weight:   Wt Readings from Last 1 Encounters:  04/19/19 77 kg    Ideal Body Weight:  80.9 kg  BMI:  Body mass index is 23.02 kg/m.  Estimated Nutritional Needs:   Kcal:  2000-2200  Protein:  100-115 grams  Fluid:  > 2.0  L    [name], MS, RD, LDN Pager # [phone] After hours/ weekend pager # [phone]

## 2019-05-03 NOTE — Progress Notes (Signed)
Inpatient Diabetes Program Recommendations  AACE/ADA: New Consensus Statement on Inpatient Glycemic Control (2015)  Target Ranges:  Prepandial:   less than 140 mg/dL      Peak postprandial:   less than 180 mg/dL (1-2 hours)      Critically ill patients:  140 - 180 mg/dL   Lab Results  Component Value Date   GLUCAP 97 05/03/2019   HGBA1C 12.3 (H) 04/15/2019    Review of Glycemic Control  Diabetes history: New Diagnosis Results for RAYDEN, DOCK (MRN [id number]) as of 05/02/2019 10:52  Ref. Range 04/30/2019 08:54 04/30/2019 12:11 04/30/2019 16:17 04/30/2019 16:40 04/30/2019 21:19 04/30/2019 21:53  Glucose-Capillary Latest Ref Range: 70 - 99 mg/dL 112 (H) 162 (H) 68 (L) 92 66 (L) 109 (H)   Results for SIMON, LLAMAS (MRN [id number]) as of 05/02/2019 10:52  Ref. Range 05/01/2019 06:11 05/01/2019 11:13 05/01/2019 16:17 05/01/2019 21:09 05/01/2019 21:31 05/02/2019 06:13  Glucose-Capillary Latest Ref Range: 70 - 99 mg/dL 114 (H) 165 (H) 123 (H) 66 (L) 78 132 (H)   Results for ARNETT, DUDDY (MRN [id number]) as of 05/03/2019 08:46  Ref. Range 05/02/2019 06:13 05/02/2019 10:54 05/02/2019 16:37 05/02/2019 21:22 05/03/2019 06:12  Glucose-Capillary Latest Ref Range: 70 - 99 mg/dL 132 (H) 87 134 (H) 91 97  Inpatient Diabetes Program Recommendations:     A few mild hypoglycemia occurences On Glipizide 5 mg bid Metformin 1000 mg bid  Decrease Novolog Correction to 0-9 units tid.   Thanks,  Tama Headings RN, MSN, BC-ADM Inpatient Diabetes Coordinator Team Pager [phone] (8a-5p)

## 2019-05-04 LAB — BASIC METABOLIC PANEL
Anion gap: 11 (ref 5–15)
BUN: 8 mg/dL (ref 6–20)
CO2: 23 mmol/L (ref 22–32)
Calcium: 9.4 mg/dL (ref 8.9–10.3)
Chloride: 104 mmol/L (ref 98–111)
Creatinine, Ser: 0.75 mg/dL (ref 0.61–1.24)
GFR calc Af Amer: >60 mL/min (ref >60)
GFR calc non Af Amer: >60 mL/min (ref >60)
Glucose, Bld: 135 mg/dL — ABNORMAL HIGH (ref 70–99)
Potassium: 3.8 mmol/L (ref 3.5–5.1)
Sodium: 138 mmol/L (ref 135–145)

## 2019-05-04 LAB — CBC
HCT: 38.9 % — ABNORMAL LOW (ref 39.0–52.0)
Hemoglobin: 12.3 g/dL — ABNORMAL LOW (ref 13.0–17.0)
MCH: 22.9 pg — ABNORMAL LOW (ref 26.0–34.0)
MCHC: 31.6 g/dL (ref 30.0–36.0)
MCV: 72.4 fL — ABNORMAL LOW (ref 80.0–100.0)
Platelets: 466 10*3/uL — ABNORMAL HIGH (ref 150–400)
RBC: 5.37 MIL/uL (ref 4.22–5.81)
RDW: 14.1 % (ref 11.5–15.5)
WBC: 6.2 10*3/uL (ref 4.0–10.5)
nRBC: 0 % (ref 0.0–0.2)

## 2019-05-04 LAB — GLUCOSE, CAPILLARY
Glucose-Capillary: 80 mg/dL (ref 70–99)
Glucose-Capillary: 103 mg/dL — ABNORMAL HIGH (ref 70–99)
Glucose-Capillary: 123 mg/dL — ABNORMAL HIGH (ref 70–99)
Glucose-Capillary: 153 mg/dL — ABNORMAL HIGH (ref 70–99)
Glucose-Capillary: 47 mg/dL — ABNORMAL LOW (ref 70–99)
Glucose-Capillary: 69 mg/dL — ABNORMAL LOW (ref 70–99)
Glucose-Capillary: 111 mg/dL — ABNORMAL HIGH (ref 70–99)
Glucose-Capillary: 138 mg/dL — ABNORMAL HIGH (ref 70–99)

## 2019-05-04 NOTE — Progress Notes (Signed)
STROKE TEAM PROGRESS NOTE   INTERVAL HISTORY No changes. Vitals stable. No insurance approval yet per Child psychotherapistsocial worker for SNF bed.  Vitals:   05/04/19 0028 05/04/19 0307 05/04/19 0747 05/04/19 1119  BP: 135/88 136/84 128/90 (!) 141/86  Pulse: 84 88 78 90  Resp: 16 14 16 17   Temp: 98.5 F (36.9 C) 98.3 F (36.8 C) 98.2 F (36.8 C) 98.3 F (36.8 C)  TempSrc: Oral Oral Oral Oral  SpO2: 98% 99% 100% 98%  Weight:      Height:        CBC:  Recent Labs  Lab 04/28/19 0326 04/30/19 1930  WBC 6.3 7.0  HGB 12.1* 12.4*  HCT 38.2* 39.1  MCV 72.8* 72.0*  PLT 477* 491*    Basic Metabolic Panel:  Recent Labs  Lab 04/28/19 0326 04/30/19 1930  NA 136 135  K 3.9 3.9  CL 101 102  CO2 25 20*  GLUCOSE 124* 101*  BUN 13 10  CREATININE 0.83 0.75  CALCIUM 9.3 9.3    IMAGING  CT Head WO Contrast 04/16/2019 Areas of acute infarction are showing low-density and swelling but no macroscopic hemorrhage by CT. As shown by previous MRI, there is involvement of the cerebellum, left cerebral peduncle and thalamus and left posteromedial temporal lobe and occipital lobe. Tiny focus of infarction in the right occipital lobe as well.  [name] 04/15/2019 1. Acute infarction throughout the left PCA territory (including most of the left thalamus), left more than right cerebellum, and right pons. Petechial hemorrhage is seen at the level of the pons.  2. Prior left frontal infarcts and microvascular ischemic gliosis.   Cerebral angiogram 04/14/2019 4 vessel cerebral arteriogram followed by complete revascularization of occluded Lt PCA  With x 2 passes with 5mm x 33mm embotrap retriever  device achieving a TICI 3 revascularization.  Ct Code Stroke Cta Head W/wo Contrast Ct Code Stroke Cta Neck W/wo Contrast Ct Code Stroke Cta Cerebral Perfusion W/wo Contrast 04/14/2019 1. Left P2 occlusion with downstream reconstitution. Qualitative assessment of CT perfusion maps shows ischemia  without visible core infarct in the left occipital lobe.  2. Mild atherosclerosis in the neck without flow limiting stenosis or embolic source seen.  3. No large vessel occlusion in the anterior circulation.  4. Mild mid basilar narrowing.   Ct Head Code Stroke Wo Contrast 04/14/2019 1. No acute finding.  2. Small remote appearing infarcts in the left frontal lobe.    LE Doppler 04/15/2019 Right: There is no evidence of deep vein thrombosis in the lower extremity. No cystic structure found in the popliteal fossa. Left: There is no evidence of deep vein thrombosis in the lower extremity. No cystic structure found in the popliteal fossa.  2D Echocardiogram 04/15/2019  1. The left ventricle has normal systolic function, with an ejection fraction of 55-60%. The cavity size was normal. Left ventricular diastolic parameters were normal. No evidence of left ventricular regional wall motion abnormalities.  2. The right ventricle has normal systolic function. The cavity was normal. There is no increase in right ventricular wall thickness.  3. The mitral valve is abnormal. Mild thickening of the mitral valve leaflet. The MR jet is posteriorly-directed.  4. The tricuspid valve is grossly normal.  5. The aortic valve is tricuspid. No stenosis of the aortic valve.  6. The aorta is normal unless otherwise noted.  7. The inferior vena cava was normal in size with <50% respiratory variability. SUMMARY LVEF 55-60%, normal wall thickness,  normal wall motion, normal diastolic function, mild posteriorly directed MR, normal size IVC that does not collapse, no PFO by color doppler  FINDINGS  Left Ventricle: The left ventricle has normal systolic function, with an ejection fraction of 55-60%. The cavity size was normal. There is no increase in left ventricular wall thickness. Left ventricular diastolic parameters were normal. Normal left ventricular filling pressures No evidence of left ventricular regional wall  motion abnormalities..  EEG  04/15/2019 This study is suggestive of cortical dysfunction in the left hemisphere secondary to underlying stroke as well as mild diffuse encephalopathy. No seizures or definite epileptiform discharges were seen throughout the recording.  TCD w/ bubble 04/18/2019  No HTIS heard during rest. No HITS heard during valsalva.  TEE 04/19/2019 LVEF 55-60% No LA/LAA thrombus or mass No ASD or PFO by color flow Doppler or saline microcavitation study. Mild AR, mild MR.   PHYSICAL EXAM     General - Well nourished, well developed, African gentleman who is not in distress  Ophthalmologic - fundi not visualized due to noncooperation.  Cardiovascular - Regular rate and rhythm.  Neuro - awake, alert, eyes open, following most of the simple commands. Anarthria.  Dysconjugate eyes, left eye CN III palsy with pupil sparing, right eye CN VI palsy, PERRL. Not blinking to visual threat on the right. Right facial droop, tongue protrusion not cooperative, against gravity of LUE but significant ataxia, able to follow simple commands with left hand. lifts left leg slightly off of the bed on command, follow simple commands of the left foot. On pain stimulation, slight withdraw of RLE but no movement of RUE. Increased muscle tone on the RUE. Positive triple reflex on the right. Sensation and gait not tested.   ASSESSMENT/PLAN [name] with no significant past medical history presenting with right-sided weakness, decreased sensation on the right, diplopia and headache. Complete revascularization of occluded L PCA w/ mechanical thrombectomy.   Stroke: embolic multifocal posterior circulation infarcts with left P2 occlusion s/p IR with TICI3 reperfusion - likely embolic   Code Stroke CT head No acute abnormality. Old infarct L frontal lobe.   CTA head & neck L P2 occlusion. Mild neck atherosclerosis . No LVO anterior circulation. Mild mid BA narrowing.  CT  perfusion ischemia w/o visible core  CT Head WO Contrast - 04/16/19 - Areas of acute infarction are showing low-density and swelling but no macroscopic hemorrhage by CT.  As shown by previous MRI, there is involvement of the cerebellum, left cerebral peduncle and thalamus and left posteromedial temporal lobe and occipital lobe. Tiny focus of infarction in the right occipital lobe as well.  Cerebral angio TICI reperfusion of occluded L PCA  MRI  L PCA (including thalamus), L>R cerebellum, R pontine and left midbrain infarct. Petechial hemorrhage seen in pons. Old L frontal infarcts.   LE Doppler no DVT - no evidence of deep vein thrombosis   2D Echo - EF 55-60%.  No cardiac source of emboli identified.   TCD bubble no HITS  TEE neg  UDS - pos for benzos - UDS done on 9/1  EEG - cortical dysfunction but no seizures.  LDL 88  HgbA1c 12.3  Lovenox 40 mg sq daily  for VTE prophylaxis  No antithrombotic prior to admission, now on aspirin 81 mg daily and clopidogrel 75 mg daily x 3 weeks, then aspirin alone. (Plavix started 04/21/19 - last dose 05/12/19)  Therapy recommendations:  CIR -> D/t lack of  post hospitalization family support - will pursue SNF - will need rapid covid testing prior to D/C  Medically ready for d/c when bed found  Disposition:  pending (SW trying to help family w/ placement, MA MCD and transfer back to [place])  Possible seizure  Full body rigid shaking hours post IR, L>R  Loaded with keppra  EEG - cortical dysfunction but no seizures.  Continue keppra 500mg  bid  Hypertension  No hx of HTN, on no home meds  Placed On cleviprex gtt for BP control post IR, now off . On amlodipine 10, lotensin 5  . BP 130s  . BP goal normotensive  Hyperlipidemia  Home meds:  No statin  LDL 88, goal < 70  HDL 39  On lipitor 20  Continue statin at discharge  Diabetes type II, new diagnosis, Uncontrolled Hyperglycemia, improving  HgbA1c 12.3, goal <  7.0  CBGs  on metformin 1000mg  bid (off glipizde to 5 mg bid d/t hypoglycemia)  DM coordinator consulted - should glucoses remain elevated. May benefit from SGLT-2inhibitior if he gets insurance   Hypoglycemic episode during the night  SSI - decreased to 0-9 scale  Likely hypercoagulable state associated with uncontrolled DM  Dysphagia . Secondary to stroke . Speech on board . cleared for Dysphagia 3 nectar thick liquids with MBS-> D2 nectar thick . Encourage po intake . Continue IVF @ 40 -  d/c on discharge  UTI with fever, resolved  UA showed WBC > 50  Urine culture enterobactor and staph coag neg  Sensitive to cipro  Finished cipro 500mg  bid course (ends 9/8)  Fever resolved   Other Stroke Risk Factors   hx of stroke - small remote appearing infarcts in the left frontal lobe by CT and MRI  Other Active Problems  Microcytic anemia - iron 38, Ferritin 115 - put on iron tab bid for 14 days.   Pt found on floor 04/23/19 by nursing staff. No obvious injury, complaints of pain, or neurologic changes.   Hospital day # 20  Continue present treatment. Medically stable for transfer Await SNF bed  Delia Heady, MD  To contact Stroke Continuity provider, please refer to [email or web address]. After hours, contact General Neurology

## 2019-05-04 NOTE — Progress Notes (Signed)
Pt was alert orient to self, no change in neuro status. CBG=52. Pt was given D50, 64ml IVP. Repeat CBG=138. Dr. Leonel Ramsay was made aware. CBG will be repeated at 2am

## 2019-05-04 NOTE — Progress Notes (Signed)
STROKE TEAM PROGRESS NOTE   INTERVAL HISTORY No changes.Lying in bed comfortably. Vitals stable. No insurance approval yet per Education officer, museum for SNF bed.  Vitals:   05/04/19 0307 05/04/19 0747 05/04/19 1119 05/04/19 1549  BP: 136/84 128/90 (!) 141/86 130/80  Pulse: 88 78 90 (!) 104  Resp: 14 16 17 18   Temp: 98.3 F (36.8 C) 98.2 F (36.8 C) 98.3 F (36.8 C) 98.7 F (37.1 C)  TempSrc: Oral Oral Oral Oral  SpO2: 99% 100% 98% 100%  Weight:      Height:        CBC:  Recent Labs  Lab 04/30/19 1930 05/04/19 1911  WBC 7.0 6.2  HGB 12.4* 12.3*  HCT 39.1 38.9*  MCV 72.0* 72.4*  PLT 491* 466*    Basic Metabolic Panel:  Recent Labs  Lab 04/30/19 1930 05/04/19 1911  NA 135 138  K 3.9 3.8  CL 102 104  CO2 20* 23  GLUCOSE 101* 135*  BUN 10 8  CREATININE 0.75 0.75  CALCIUM 9.3 9.4    IMAGING  CT Head WO Contrast 04/16/2019 Areas of acute infarction are showing low-density and swelling but no macroscopic hemorrhage by CT. As shown by previous MRI, there is involvement of the cerebellum, left cerebral peduncle and thalamus and left posteromedial temporal lobe and occipital lobe. Tiny focus of infarction in the right occipital lobe as well.  [name] 04/15/2019 1. Acute infarction throughout the left PCA territory (including most of the left thalamus), left more than right cerebellum, and right pons. Petechial hemorrhage is seen at the level of the pons.  2. Prior left frontal infarcts and microvascular ischemic gliosis.   Cerebral angiogram 04/14/2019 4 vessel cerebral arteriogram followed by complete revascularization of occluded Lt PCA  With x 2 passes with 32mm x 25mm embotrap retriever  device achieving a TICI 3 revascularization.  Ct Code Stroke Cta Head W/wo Contrast Ct Code Stroke Cta Neck W/wo Contrast Ct Code Stroke Cta Cerebral Perfusion W/wo Contrast 04/14/2019 1. Left P2 occlusion with downstream reconstitution. Qualitative assessment of CT  perfusion maps shows ischemia without visible core infarct in the left occipital lobe.  2. Mild atherosclerosis in the neck without flow limiting stenosis or embolic source seen.  3. No large vessel occlusion in the anterior circulation.  4. Mild mid basilar narrowing.   Ct Head Code Stroke Wo Contrast 04/14/2019 1. No acute finding.  2. Small remote appearing infarcts in the left frontal lobe.    LE Doppler 04/15/2019 Right: There is no evidence of deep vein thrombosis in the lower extremity. No cystic structure found in the popliteal fossa. Left: There is no evidence of deep vein thrombosis in the lower extremity. No cystic structure found in the popliteal fossa.  2D Echocardiogram 04/15/2019  1. The left ventricle has normal systolic function, with an ejection fraction of 55-60%. The cavity size was normal. Left ventricular diastolic parameters were normal. No evidence of left ventricular regional wall motion abnormalities.  2. The right ventricle has normal systolic function. The cavity was normal. There is no increase in right ventricular wall thickness.  3. The mitral valve is abnormal. Mild thickening of the mitral valve leaflet. The MR jet is posteriorly-directed.  4. The tricuspid valve is grossly normal.  5. The aortic valve is tricuspid. No stenosis of the aortic valve.  6. The aorta is normal unless otherwise noted.  7. The inferior vena cava was normal in size with <50% respiratory variability. SUMMARY LVEF  55-60%, normal wall thickness, normal wall motion, normal diastolic function, mild posteriorly directed MR, normal size IVC that does not collapse, no PFO by color doppler  FINDINGS  Left Ventricle: The left ventricle has normal systolic function, with an ejection fraction of 55-60%. The cavity size was normal. There is no increase in left ventricular wall thickness. Left ventricular diastolic parameters were normal. Normal left ventricular filling pressures No evidence of  left ventricular regional wall motion abnormalities..  EEG  04/15/2019 This study is suggestive of cortical dysfunction in the left hemisphere secondary to underlying stroke as well as mild diffuse encephalopathy. No seizures or definite epileptiform discharges were seen throughout the recording.  TCD w/ bubble 04/18/2019  No HTIS heard during rest. No HITS heard during valsalva.  TEE 04/19/2019 LVEF 55-60% No LA/LAA thrombus or mass No ASD or PFO by color flow Doppler or saline microcavitation study. Mild AR, mild MR.   PHYSICAL EXAM     General - Well nourished, well developed, African gentleman who is not in distress  Ophthalmologic - fundi not visualized due to noncooperation.  Cardiovascular - Regular rate and rhythm.  Neuro - awake, alert, eyes open, following most of the simple commands. Anarthria.  Dysconjugate eyes, left eye CN III palsy with pupil sparing, right eye CN VI palsy, PERRL. Not blinking to visual threat on the right. Right facial droop, tongue protrusion not cooperative, against gravity of LUE but significant ataxia, able to follow simple commands with left hand. lifts left leg slightly off of the bed on command, follow simple commands of the left foot. On pain stimulation, slight withdraw of RLE but no movement of RUE. Increased muscle tone on the RUE. Positive triple reflex on the right. Sensation and gait not tested.   ASSESSMENT/PLAN Rodney Collier is a 53 y.o. male with no significant past medical history presenting with right-sided weakness, decreased sensation on the right, diplopia and headache. Complete revascularization of occluded L PCA w/ mechanical thrombectomy.   Stroke: embolic multifocal posterior circulation infarcts with left P2 occlusion s/p IR with TICI3 reperfusion - likely embolic   Code Stroke CT head No acute abnormality. Old infarct L frontal lobe.   CTA head & neck L P2 occlusion. Mild neck atherosclerosis . No LVO anterior  circulation. Mild mid BA narrowing.  CT perfusion ischemia w/o visible core  CT Head WO Contrast - 04/16/19 - Areas of acute infarction are showing low-density and swelling but no macroscopic hemorrhage by CT.  As shown by previous MRI, there is involvement of the cerebellum, left cerebral peduncle and thalamus and left posteromedial temporal lobe and occipital lobe. Tiny focus of infarction in the right occipital lobe as well.  Cerebral angio TICI reperfusion of occluded L PCA  MRI  L PCA (including thalamus), L>R cerebellum, R pontine and left midbrain infarct. Petechial hemorrhage seen in pons. Old L frontal infarcts.   LE Doppler no DVT - no evidence of deep vein thrombosis   2D Echo - EF 55-60%.  No cardiac source of emboli identified.   TCD bubble no HITS  TEE neg  UDS - pos for benzos - UDS done on 9/1  EEG - cortical dysfunction but no seizures.  LDL 88  HgbA1c 12.3  Lovenox 40 mg sq daily  for VTE prophylaxis  No antithrombotic prior to admission, now on aspirin 81 mg daily and clopidogrel 75 mg daily x 3 weeks, then aspirin alone. (Plavix started 04/21/19 - last dose 05/12/19)  Therapy recommendations:  CIR ->  D/t lack of post hospitalization family support - will pursue SNF - will need rapid covid testing prior to D/C  Medically ready for d/c when bed found  Disposition:  pending (SW trying to help family w/ placement, MA MCD and transfer back to [facility])  Possible seizure  Full body rigid shaking hours post IR, L>R  Loaded with keppra  EEG - cortical dysfunction but no seizures.  Continue keppra 500mg  bid  Hypertension  No hx of HTN, on no home meds  Placed On cleviprex gtt for BP control post IR, now off . On amlodipine 10, lotensin 5  . BP 130s  . BP goal normotensive  Hyperlipidemia  Home meds:  No statin  LDL 88, goal < 70  HDL 39  On lipitor 20  Continue statin at discharge  Diabetes type II, new diagnosis, Uncontrolled Hyperglycemia,  improving  HgbA1c 12.3, goal < 7.0  CBGs  on metformin 1000mg  bid (off glipizde to 5 mg bid d/t hypoglycemia)  DM coordinator consulted - should glucoses remain elevated. May benefit from SGLT-2inhibitior if he gets insurance   Hypoglycemic episode during the night  SSI - decreased to 0-9 scale  Likely hypercoagulable state associated with uncontrolled DM  Dysphagia . Secondary to stroke . Speech on board . cleared for Dysphagia 3 nectar thick liquids with MBS-> D2 nectar thick . Encourage po intake . Continue IVF @ 40 -  d/c on discharge  UTI with fever, resolved  UA showed WBC > 50  Urine culture enterobactor and staph coag neg  Sensitive to cipro  Finished cipro 500mg  bid course (ends 9/8)  Fever resolved   Other Stroke Risk Factors   hx of stroke - small remote appearing infarcts in the left frontal lobe by CT and MRI  Other Active Problems  Microcytic anemia - iron 38, Ferritin 115 - put on iron tab bid for 14 days.   Pt found on floor 04/23/19 by nursing staff. No obvious injury, complaints of pain, or neurologic changes.   Hospital day # 20  Continue present treatment. Medically stable for transfer Await SNF bed  Delia Heady, MD  To contact Stroke Continuity provider, please refer to [email or web address]. After hours, contact General Neurology

## 2019-05-04 NOTE — Progress Notes (Signed)
Physical Therapy Treatment Patient Details Name: Rodney Collier MRN: [id number] DOB: 1966-01-11 Today's Date: 05/04/2019    History of Present Illness Pt is a 53 y.o. M with no known PMH who presents with right sided weakness, decreased sensation on right, diplopia and headache. CT showing remote L frontal infarcts, CTA head/neck with L P2 occlusion, MRI with acute L PCA infarct including much of L thalamus, L > R cerebellum, R pons. Now s/p complete revascularization of occluded L PCA with mechanical thrombectomy. ETT 8/27-8/28; self extubated 8/28.    PT Comments    Pt very flat and appears apathetic for most of the session. Pt following commands but fatigues quickly with EOB/OOB activity. OOB functional activity limited by large bowel movement at beginning of session. Noted pt now turning head past midline however continues to have difficulty with tracking objects past midline. Will continue to follow and progress as able per POC.     Follow Up Recommendations  Supervision/Assistance - 24 hour;SNF     Equipment Recommendations  Wheelchair (measurements PT);Wheelchair cushion (measurements PT);3in1 (PT);Hospital bed    Recommendations for Other Services Rehab consult     Precautions / Restrictions Precautions Precautions: Fall Precaution Comments: R hemiparesis and inattention Restrictions Weight Bearing Restrictions: No    Mobility  Bed Mobility Overal bed mobility: Needs Assistance Bed Mobility: Rolling;Sidelying to Sit Rolling: Mod assist Sidelying to sit: Mod assist       General bed mobility comments: Able to reach for railing and roll to R with VC's. Assist for legs off EOB and for trunk elevation to full sitting position. Pt scooting L hip very far forward to EOB and requires assist to scoot back as to not slide off EOB.   Transfers Overall transfer level: Needs assistance Equipment used: Ambulation equipment used Transfers: Sit to/from Stand Sit to Stand: Min  assist;Mod assist;+2 physical assistance         General transfer comment: Pt was able to initiate pull to stand from center bar on [place]. Pt initially requiring +2 min assist but as pt fatigued, he required up to mod assist to power-up to full stand.   Ambulation/Gait             General Gait Details: unable at this time.    Stairs             Wheelchair Mobility    Modified Rankin (Stroke Patients Only) Modified Rankin (Stroke Patients Only) Pre-Morbid Rankin Score: No symptoms Modified Rankin: Severe disability     Balance Overall balance assessment: Needs assistance Sitting-balance support: Feet supported;Single extremity supported Sitting balance-Leahy Scale: Poor Sitting balance - Comments: able to maintain sitting with strong hold on footboard of bed Postural control: Right lateral lean;Posterior lean Standing balance support: Single extremity supported Standing balance-Leahy Scale: Poor Standing balance comment: Mod assist in standing with the support of the steady standing frame to lock out his right leg.  Practiced midline standing in front of mirror. Pt attended to self more with practice.                             Cognition Arousal/Alertness: Awake/alert Behavior During Therapy: Flat affect Overall Cognitive Status: Difficult to assess Area of Impairment: Attention;Following commands;Safety/judgement;Problem solving                 Orientation Level: Place;Time;Situation Current Attention Level: Sustained Memory: Decreased recall of precautions Following Commands: Follows one step commands inconsistently Safety/Judgement: Decreased awareness  of safety;Decreased awareness of deficits Awareness: Emergent Problem Solving: Difficulty sequencing;Requires verbal cues;Requires tactile cues        Exercises      General Comments        Pertinent Vitals/Pain Pain Assessment: Faces Faces Pain Scale: No hurt Pain Location:  Faces pain scale is 0 but pt nods head "yes" when asked if he was in pain. Pt rubbing his L hand over his L hip while on R side when asked where pain was.  Pain Descriptors / Indicators: (Indicating) Pain Intervention(s): Monitored during session;Repositioned    Home Living                      Prior Function            PT Goals (current goals can now be found in the care plan section) Acute Rehab PT Goals Patient Stated Goal: unable PT Goal Formulation: Patient unable to participate in goal setting Time For Goal Achievement: 05/16/19 Potential to Achieve Goals: Good Progress towards PT goals: Progressing toward goals    Frequency    Min 3X/week      PT Plan Frequency needs to be updated    Co-evaluation              AM-PAC PT "6 Clicks" Mobility   Outcome Measure  Help needed turning from your back to your side while in a flat bed without using bedrails?: A Lot Help needed moving from lying on your back to sitting on the side of a flat bed without using bedrails?: A Lot Help needed moving to and from a bed to a chair (including a wheelchair)?: A Lot Help needed standing up from a chair using your arms (e.g., wheelchair or bedside chair)?: A Lot Help needed to walk in hospital room?: Total Help needed climbing 3-5 steps with a railing? : Total 6 Click Score: 10    End of Session Equipment Utilized During Treatment: Gait belt Activity Tolerance: Patient tolerated treatment well Patient left: in chair;with call bell/phone within reach;with chair alarm set Nurse Communication: Need for lift equipment;Mobility status PT Visit Diagnosis: Other abnormalities of gait and mobility (R26.89);Other symptoms and signs involving the nervous system (R29.898) Hemiplegia - Right/Left: Right Hemiplegia - dominant/non-dominant: Dominant Hemiplegia - caused by: Cerebral infarction     Time: 1050-1109 PT Time Calculation (min) (ACUTE ONLY): 19 min  Charges:  $Gait  Training: 8-22 mins                     Conni SlipperLaura Aleyda Gindlesperger, PT, DPT Acute Rehabilitation Services Pager: [phone] Office: [phone]    Marylynn PearsonLaura D Aracelia Brinson 05/04/2019, 1:39 PM

## 2019-05-04 NOTE — Plan of Care (Signed)
  Problem: Clinical Measurements: Goal: Ability to maintain clinical measurements within normal limits will improve Outcome: Not Progressing   Problem: Nutrition: Goal: Adequate nutrition will be maintained Outcome: Not Progressing   Problem: Coping: Goal: Level of anxiety will decrease Outcome: Not Progressing   

## 2019-05-04 NOTE — Progress Notes (Signed)
Inpatient Diabetes Program Recommendations  AACE/ADA: New Consensus Statement on Inpatient Glycemic Control (2015)  Target Ranges:  Prepandial:   less than 140 mg/dL      Peak postprandial:   less than 180 mg/dL (1-2 hours)      Critically ill patients:  140 - 180 mg/dL   Lab Results  Component Value Date   GLUCAP 123 (H) 05/04/2019   HGBA1C 12.3 (H) 04/15/2019    Review of Glycemic Control Results for OMERE, MARTI (MRN [id number]) as of 05/04/2019 11:37  Ref. Range 05/03/2019 22:04 05/03/2019 22:46 05/04/2019 02:07 05/04/2019 06:36 05/04/2019 07:51  Glucose-Capillary Latest Ref Range: 70 - 99 mg/dL 52 (L) 138 (H) 80 103 (H) 123 (H)   Diabetes history: DM 2-New diagnosis Outpatient Diabetes medications:  None Current orders for Inpatient glycemic control:  Novolog sensitive tid with meals and HS Glucotrol 5 mg bid Metformin 1000 mg bid  Inpatient Diabetes Program Recommendations:    Please consider d/c of Glucotrol due to low blood sugars.   Thanks,  Adah Perl, RN, BC-ADM Inpatient Diabetes Coordinator Pager [phone] (8a-5p)

## 2019-05-05 LAB — GLUCOSE, CAPILLARY
Glucose-Capillary: 122 mg/dL — ABNORMAL HIGH (ref 70–99)
Glucose-Capillary: 122 mg/dL — ABNORMAL HIGH (ref 70–99)
Glucose-Capillary: 130 mg/dL — ABNORMAL HIGH (ref 70–99)
Glucose-Capillary: 139 mg/dL — ABNORMAL HIGH (ref 70–99)
Glucose-Capillary: 153 mg/dL — ABNORMAL HIGH (ref 70–99)
Glucose-Capillary: 229 mg/dL — ABNORMAL HIGH (ref 70–99)

## 2019-05-05 NOTE — Progress Notes (Addendum)
Occupational Therapy Treatment Patient Details Name: Rodney Collier MRN: [id number] DOB: [birth year] Today's Date: 05/05/2019    History of present illness Pt is a 53 y.o. M with no known PMH who presents with right sided weakness, decreased sensation on right, diplopia and headache. CT showing remote L frontal infarcts, CTA head/neck with L P2 occlusion, MRI with acute L PCA infarct including much of L thalamus, L > R cerebellum, R pons. Now s/p complete revascularization of occluded L PCA with mechanical thrombectomy. ETT 8/27-8/28; self extubated 8/28.   OT comments  Pt making gradual progress towards OT goals. Use of stedy and two person assist for completion of functional transfers with additional focus on sitting/standing balance. While seated in [place] pt completing x2 grooming ADL tasks at sink, requiring max cues to locate items placed at pt's midline, verbal cues to properly sequence through ADL tasks. Noted pt tending to maintain R elbow in flexed position, was able to passively stretch RUE however am concerned that pt may be at risk for R elbow contracture. He may benefit from soft elbow splint to promote further RUE extension/prevent contractures. Also noted pt with CIR denial due to decreased caregiver support; have updated d/c recommendations to reflect. Will continue to follow acutely to progress pt towards established OT goals.   Follow Up Recommendations  SNF    Equipment Recommendations  Other (comment)(TBD)          Precautions / Restrictions Precautions Precautions: Fall Precaution Comments: R hemiparesis and inattention Restrictions Weight Bearing Restrictions: No       Mobility Bed Mobility Overal bed mobility: Needs Assistance Bed Mobility: Supine to Sit     Supine to sit: Mod assist;+2 for safety/equipment     General bed mobility comments: pt able to navigate LEs towards EOB with assist for RLE over EOB and heavy trunk assist to  elevate  Transfers Overall transfer level: Needs assistance Equipment used: Ambulation equipment used Transfers: Sit to/from Stand Sit to Stand: Min assist;Min guard;+2 safety/equipment;+2 physical assistance         General transfer comment: pt with improvements in ability to stand at [place] today, able to perform with close minguard asist (from elevated seat pads of Stedy). requires increased assist from lower surfaces and when fatigued (up to modA), VCs for safe placement of LUE    Balance Overall balance assessment: Needs assistance Sitting-balance support: Feet supported;Single extremity supported Sitting balance-Leahy Scale: Fair Sitting balance - Comments: pt able to maintain static balance with close minguard assist, mostly reliant on LUE support Postural control: Right lateral lean;Posterior lean Standing balance support: Single extremity supported Standing balance-Leahy Scale: Poor Standing balance comment: able to maintain static balance with LUE support and very close minguard assist, once fatigued pt requiring increased assist (up to modA), R lateral lean with standing requiring cues to correct                           ADL either performed or assessed with clinical judgement   ADL Overall ADL's : Needs assistance/impaired     Grooming: Wash/dry face;Brushing hair;Sitting;Moderate assistance Grooming Details (indicate cue type and reason): seated in [place] at sink; max cues to locate comb on sink; requires assist to facilitate forward flexion at trunk to reach with LUE and turn on/off water during face washing task; requires cues to wet washcloth as he initially attempted to wash face with dry cloth  Functional mobility during ADLs: Minimal assistance;Moderate assistance(sit<>stand at [facility]) General ADL Comments: utilized Stedy during session for sitting/standing balance activities including seated grooming ADL at sink,  use of mirror to facilitate forward gaze at midline     Vision   Additional Comments: requires cues to focus on specific target, able to maintain gaze at midline this session   Perception     Praxis      Cognition Arousal/Alertness: Awake/alert Behavior During Therapy: Flat affect Overall Cognitive Status: Difficult to assess Area of Impairment: Attention;Following commands;Safety/judgement;Problem solving                   Current Attention Level: Sustained Memory: Decreased recall of precautions Following Commands: Follows one step commands with increased time Safety/Judgement: Decreased awareness of safety;Decreased awareness of deficits Awareness: Emergent Problem Solving: Difficulty sequencing;Requires verbal cues;Requires tactile cues General Comments: pt overall following commands, flat affect overall; pt attempting to communicate with therapist but unable to understanding words communicated, pt does intermittently nod his head yes/no        Exercises Exercises: General Upper Extremity;Other exercises General Exercises - Upper Extremity Shoulder Flexion: PROM;5 reps;Seated Wrist Flexion: PROM;5 reps;Seated;Right Wrist Extension: PROM;Right;5 reps;Seated Digit Composite Flexion: PROM;5 reps;Right;Seated Composite Extension: PROM;5 reps;Right;Seated Other Exercises Other Exercises: prolonged stretch to R elbow, pt tending to maintain elbow in flexed position   Shoulder Instructions       General Comments      Pertinent Vitals/ Pain       Pain Assessment: Faces Faces Pain Scale: Hurts a little bit Pain Location: pt resistive, pulling away some when attempting passive stretching to R elbow Pain Descriptors / Indicators: Guarding Pain Intervention(s): Monitored during session;Repositioned;Limited activity within patient's tolerance  Home Living                                          Prior Functioning/Environment               Frequency  Min 2X/week        Progress Toward Goals  OT Goals(current goals can now be found in the care plan section)  Progress towards OT goals: Progressing toward goals  Acute Rehab OT Goals Patient Stated Goal: unable OT Goal Formulation: Patient unable to participate in goal setting Time For Goal Achievement: 05/19/19 Potential to Achieve Goals: Good ADL Goals Pt Will Perform Grooming: with min assist;sitting Pt Will Transfer to Toilet: with max assist;bedside commode;stand pivot transfer Additional ADL Goal #1: pt will complete bed mobility mod (A) as precursor to adls Additional ADL Goal #2: pt will complete 2 step command  Plan Discharge plan needs to be updated    Co-evaluation    PT/OT/SLP Co-Evaluation/Treatment: Yes Reason for Co-Treatment: Necessary to address cognition/behavior during functional activity;Complexity of the patient's impairments (multi-system involvement);For patient/therapist safety   OT goals addressed during session: ADL's and self-care      AM-PAC OT "6 Clicks" Daily Activity     Outcome Measure   Help from another person eating meals?: A Lot Help from another person taking care of personal grooming?: A Lot Help from another person toileting, which includes using toliet, bedpan, or urinal?: A Lot Help from another person bathing (including washing, rinsing, drying)?: A Lot Help from another person to put on and taking off regular upper body clothing?: A Lot Help from another person to put on and taking off regular  lower body clothing?: Total 6 Click Score: 11    End of Session Equipment Utilized During Treatment: Gait belt  OT Visit Diagnosis: Unsteadiness on feet (R26.81);Hemiplegia and hemiparesis Hemiplegia - Right/Left: Right Hemiplegia - dominant/non-dominant: Dominant Hemiplegia - caused by: Cerebral infarction   Activity Tolerance Patient tolerated treatment well   Patient Left in chair;with call bell/phone within  reach;with chair alarm set(seatbelt alarm donned)   Nurse Communication Mobility status        Time: [id number] OT Time Calculation (min): 29 min  Charges: OT General Charges $OT Visit: 1 Visit OT Treatments $Self Care/Home Management : 8-22 mins  Marcy SirenBreanna Mckinnon Glick, OT Supplemental Rehabilitation Services Pager [phone] Office [phone]   Rodney Collier 05/05/2019, 11:01 AM

## 2019-05-05 NOTE — Progress Notes (Signed)
Physical Therapy Treatment Patient Details Name: Rodney Collier MRN: [id number] DOB: [birth year] Today's Date: 05/05/2019    History of Present Illness Pt is a 53 y.o. M with no known PMH who presents with right sided weakness, decreased sensation on right, diplopia and headache. CT showing remote L frontal infarcts, CTA head/neck with L P2 occlusion, MRI with acute L PCA infarct including much of L thalamus, L > R cerebellum, R pons. Now s/p complete revascularization of occluded L PCA with mechanical thrombectomy. ETT 8/27-8/28; self extubated 8/28.    PT Comments    Patient received sleeping in bed, easily roused. Patient alert, not attentive. Patient requires mod assist +2 for safety to perform supine to sit. Right lateral/posterior lean initially with sitting. Performed sit to stand with stedy and +2 min/mod assist for safety. Performed weight shifting and static standing balance in stedy x 3 reps. Patient will continue to benefit from skilled PT to improve strength and functional independence.      Follow Up Recommendations  SNF;Supervision/Assistance - 24 hour     Equipment Recommendations  Wheelchair (measurements PT);Wheelchair cushion (measurements PT);3in1 (PT);Hospital bed    Recommendations for Other Services       Precautions / Restrictions Precautions Precautions: Fall Precaution Comments: R hemiparesis and inattention Restrictions Weight Bearing Restrictions: No    Mobility  Bed Mobility Overal bed mobility: Needs Assistance Bed Mobility: Supine to Sit   Sidelying to sit: Mod assist;+2 for safety/equipment Supine to sit: Mod assist;+2 for safety/equipment     General bed mobility comments: pt able to navigate LEs towards EOB with assist for RLE over EOB and heavy trunk assist to elevate, poor balance initially while trying to scoot to edge of bed.  Transfers Overall transfer level: Needs assistance Equipment used: Ambulation equipment used Transfers: Sit  to/from Stand Sit to Stand: Min assist;Min guard;+2 safety/equipment;+2 physical assistance         General transfer comment: pt with improvements in ability to stand at [place] today, able to perform with close minguard asist (from elevated seat pads of Stedy). requires increased assist from lower surfaces and when fatigued (up to modA), VCs for safe placement of LUE  Ambulation/Gait             General Gait Details: unable at this time.    Stairs             Wheelchair Mobility    Modified Rankin (Stroke Patients Only)       Balance Overall balance assessment: Needs assistance Sitting-balance support: Single extremity supported;Feet supported Sitting balance-Leahy Scale: Fair Sitting balance - Comments: pt able to maintain static balance with close minguard assist, mostly reliant on LUE support. sitting balance improved with time. Postural control: Right lateral lean;Posterior lean Standing balance support: Single extremity supported Standing balance-Leahy Scale: Poor Standing balance comment: able to maintain static balance with LUE support and very close minguard assist, once fatigued pt requiring increased assist (up to modA), R lateral lean with standing requiring cues to correct                            Cognition Arousal/Alertness: Awake/alert Behavior During Therapy: Flat affect Overall Cognitive Status: Difficult to assess Area of Impairment: Attention;Safety/judgement;Problem solving                 Orientation Level: Place;Time;Situation;Disoriented to Current Attention Level: Sustained Memory: Decreased recall of precautions Following Commands: Follows one step commands with increased time Safety/Judgement:  Decreased awareness of safety;Decreased awareness of deficits Awareness: Emergent Problem Solving: Difficulty sequencing;Requires verbal cues;Requires tactile cues General Comments: pt overall following commands, flat affect  overall; pt attempting to communicate with therapist but unable to understanding words communicated, pt does intermittently nod his head yes/no      Exercises Total Joint Exercises Ankle Circles/Pumps: PROM;10 reps;Right Long Arc Quad: PROM;10 reps;Right General Exercises - Upper Extremity Shoulder Flexion: PROM;5 reps;Seated Wrist Flexion: PROM;5 reps;Seated;Right Wrist Extension: PROM;Right;5 reps;Seated Digit Composite Flexion: PROM;5 reps;Right;Seated Composite Extension: PROM;5 reps;Right;Seated Other Exercises Other Exercises: prolonged stretch to R elbow, pt tending to maintain elbow in flexed position    General Comments        Pertinent Vitals/Pain Pain Assessment: Faces Faces Pain Scale: Hurts a little bit Pain Location: pt resistive, pulling away some when attempting passive stretching to R elbow Pain Descriptors / Indicators: Guarding Pain Intervention(s): Monitored during session;Limited activity within patient's tolerance    Home Living                      Prior Function            PT Goals (current goals can now be found in the care plan section) Acute Rehab PT Goals Patient Stated Goal: unable PT Goal Formulation: Patient unable to participate in goal setting Time For Goal Achievement: 05/16/19 Potential to Achieve Goals: Good Progress towards PT goals: Progressing toward goals    Frequency    Min 3X/week      PT Plan Current plan remains appropriate    Co-evaluation PT/OT/SLP Co-Evaluation/Treatment: Yes Reason for Co-Treatment: For patient/therapist safety;To address functional/ADL transfers PT goals addressed during session: Mobility/safety with mobility;Balance;Proper use of DME;Strengthening/ROM OT goals addressed during session: ADL's and self-care      AM-PAC PT "6 Clicks" Mobility   Outcome Measure  Help needed turning from your back to your side while in a flat bed without using bedrails?: A Lot Help needed moving from  lying on your back to sitting on the side of a flat bed without using bedrails?: A Lot Help needed moving to and from a bed to a chair (including a wheelchair)?: A Lot Help needed standing up from a chair using your arms (e.g., wheelchair or bedside chair)?: A Lot Help needed to walk in hospital room?: Total Help needed climbing 3-5 steps with a railing? : Total 6 Click Score: 10    End of Session Equipment Utilized During Treatment: Gait belt Activity Tolerance: Patient tolerated treatment well Patient left: in chair;with chair alarm set;with call bell/phone within reach Nurse Communication: Mobility status;Need for lift equipment PT Visit Diagnosis: Unsteadiness on feet (R26.81);Muscle weakness (generalized) (M62.81);Difficulty in walking, not elsewhere classified (R26.2);Hemiplegia and hemiparesis Hemiplegia - Right/Left: Right Hemiplegia - dominant/non-dominant: Non-dominant Hemiplegia - caused by: Cerebral infarction     Time: 1015-1040 PT Time Calculation (min) (ACUTE ONLY): 25 min  Charges:  $Therapeutic Activity: 8-22 mins                     Lukisha Procida, PT, GCS 05/05/19,11:39 AM

## 2019-05-05 NOTE — Progress Notes (Signed)
STROKE TEAM PROGRESS NOTE   INTERVAL HISTORY Patient is sitting up in a chair.  Looks comfortable.  No complaints.  Vital signs stable.  Vitals:   05/05/19 0006 05/05/19 0431 05/05/19 0739 05/05/19 1137  BP: 125/90 135/86 126/88 119/75  Pulse: 96 87 71 84  Resp: 18 15 18 18   Temp: 98.4 F (36.9 C) 98.2 F (36.8 C) 98.1 F (36.7 C) 98.2 F (36.8 C)  TempSrc: Oral Oral Oral Axillary  SpO2: 98% 99% 100% 99%  Weight:      Height:        CBC:  Recent Labs  Lab 04/30/19 1930 05/04/19 1911  WBC 7.0 6.2  HGB 12.4* 12.3*  HCT 39.1 38.9*  MCV 72.0* 72.4*  PLT 491* 466*    Basic Metabolic Panel:  Recent Labs  Lab 04/30/19 1930 05/04/19 1911  NA 135 138  K 3.9 3.8  CL 102 104  CO2 20* 23  GLUCOSE 101* 135*  BUN 10 8  CREATININE 0.75 0.75  CALCIUM 9.3 9.4    IMAGING CT Head WO Contrast 04/16/2019 Areas of acute infarction are showing low-density and swelling but no macroscopic hemorrhage by CT. As shown by previous MRI, there is involvement of the cerebellum, left cerebral peduncle and thalamus and left posteromedial temporal lobe and occipital lobe. Tiny focus of infarction in the right occipital lobe as well.  [name] 04/15/2019 1. Acute infarction throughout the left PCA territory (including most of the left thalamus), left more than right cerebellum, and right pons. Petechial hemorrhage is seen at the level of the pons.  2. Prior left frontal infarcts and microvascular ischemic gliosis.   Cerebral angiogram 04/14/2019 4 vessel cerebral arteriogram followed by complete revascularization of occluded Lt PCA  With x 2 passes with 5mm x 33mm embotrap retriever  device achieving a TICI 3 revascularization.  Ct Code Stroke Cta Head W/wo Contrast Ct Code Stroke Cta Neck W/wo Contrast Ct Code Stroke Cta Cerebral Perfusion W/wo Contrast 04/14/2019 1. Left P2 occlusion with downstream reconstitution. Qualitative assessment of CT perfusion maps shows ischemia  without visible core infarct in the left occipital lobe.  2. Mild atherosclerosis in the neck without flow limiting stenosis or embolic source seen.  3. No large vessel occlusion in the anterior circulation.  4. Mild mid basilar narrowing.   Ct Head Code Stroke Wo Contrast 04/14/2019 1. No acute finding.  2. Small remote appearing infarcts in the left frontal lobe.    LE Doppler 04/15/2019 Right: There is no evidence of deep vein thrombosis in the lower extremity. No cystic structure found in the popliteal fossa. Left: There is no evidence of deep vein thrombosis in the lower extremity. No cystic structure found in the popliteal fossa.  2D Echocardiogram 04/15/2019  1. The left ventricle has normal systolic function, with an ejection fraction of 55-60%. The cavity size was normal. Left ventricular diastolic parameters were normal. No evidence of left ventricular regional wall motion abnormalities.  2. The right ventricle has normal systolic function. The cavity was normal. There is no increase in right ventricular wall thickness.  3. The mitral valve is abnormal. Mild thickening of the mitral valve leaflet. The MR jet is posteriorly-directed.  4. The tricuspid valve is grossly normal.  5. The aortic valve is tricuspid. No stenosis of the aortic valve.  6. The aorta is normal unless otherwise noted.  7. The inferior vena cava was normal in size with <50% respiratory variability. SUMMARY LVEF 55-60%, normal wall  thickness, normal wall motion, normal diastolic function, mild posteriorly directed MR, normal size IVC that does not collapse, no PFO by color doppler  FINDINGS  Left Ventricle: The left ventricle has normal systolic function, with an ejection fraction of 55-60%. The cavity size was normal. There is no increase in left ventricular wall thickness. Left ventricular diastolic parameters were normal. Normal left ventricular filling pressures No evidence of left ventricular regional wall  motion abnormalities..  EEG  04/15/2019 This study is suggestive of cortical dysfunction in the left hemisphere secondary to underlying stroke as well as mild diffuse encephalopathy. No seizures or definite epileptiform discharges were seen throughout the recording.  TCD w/ bubble 04/18/2019  No HTIS heard during rest. No HITS heard during valsalva.  TEE 04/19/2019 LVEF 55-60% No LA/LAA thrombus or mass No ASD or PFO by color flow Doppler or saline microcavitation study. Mild AR, mild MR.   PHYSICAL EXAM       General - Well nourished, well developed, African gentleman who is not in distress  Ophthalmologic - fundi not visualized due to noncooperation.  Cardiovascular - Regular rate and rhythm.  Neuro - awake, alert, eyes open, following most of the simple commands. Anarthria.  Dysconjugate eyes, left eye CN III palsy with pupil sparing, right eye CN VI palsy, PERRL. Not blinking to visual threat on the right. Right facial droop, tongue protrusion not cooperative, against gravity of LUE but significant ataxia, able to follow simple commands with left hand. lifts left leg slightly off of the bed on command, follow simple commands of the left foot. On pain stimulation, slight withdraw of RLE but no movement of RUE. Increased muscle tone on the RUE. Positive triple reflex on the right. Sensation and gait not tested.   ASSESSMENT/PLAN [name] with no significant past medical history presenting with right-sided weakness, decreased sensation on the right, diplopia and headache. Complete revascularization of occluded L PCA w/ mechanical thrombectomy.   Stroke: embolic multifocal posterior circulation infarcts with left P2 occlusion s/p IR with TICI3 reperfusion - likely embolic   Code Stroke CT head No acute abnormality. Old infarct L frontal lobe.   CTA head & neck L P2 occlusion. Mild neck atherosclerosis . No LVO anterior circulation. Mild mid BA  narrowing.  CT perfusion ischemia w/o visible core  CT Head WO Contrast - 04/16/19 - Areas of acute infarction are showing low-density and swelling but no macroscopic hemorrhage by CT.  As shown by previous MRI, there is involvement of the cerebellum, left cerebral peduncle and thalamus and left posteromedial temporal lobe and occipital lobe. Tiny focus of infarction in the right occipital lobe as well.  Cerebral angio TICI reperfusion of occluded L PCA  MRI  L PCA (including thalamus), L>R cerebellum, R pontine and left midbrain infarct. Petechial hemorrhage seen in pons. Old L frontal infarcts.   LE Doppler no DVT - no evidence of deep vein thrombosis   2D Echo - EF 55-60%.  No cardiac source of emboli identified.   TCD bubble no HITS  TEE neg  UDS - pos for benzos - UDS done on 9/1  EEG - cortical dysfunction but no seizures.  LDL 88  HgbA1c 12.3  Lovenox 40 mg sq daily  for VTE prophylaxis  No antithrombotic prior to admission, now on aspirin 81 mg daily and clopidogrel 75 mg daily x 3 weeks, then aspirin alone. (Plavix started 04/21/19 - last dose 05/12/19)  Therapy recommendations:  CIR ->  D/t lack of post hospitalization family support - will pursue SNF - will need rapid covid testing prior to D/C  Medically ready for d/c when bed found  Disposition:  pending (SW trying to help family w/ placement, MA MCD and transfer back to [place])  Possible seizure  Full body rigid shaking hours post IR, L>R  Loaded with keppra  EEG - cortical dysfunction but no seizures.  Continue keppra 500mg  bid  Hypertension  No hx of HTN, on no home meds  Placed On cleviprex gtt for BP control post IR, now off . On amlodipine 10, off lotensin 5  . BP 110-120s . BP goal normotensive  Hyperlipidemia  Home meds:  No statin  LDL 88, goal < 70  HDL 39  On lipitor 20  Continue statin at discharge  Diabetes type II, new diagnosis, Uncontrolled Hyperglycemia, improving  HgbA1c  12.3, goal < 7.0  CBGs  on metformin 1000mg  bid (off glipizde to 5 mg bid d/t hypoglycemia)  DM coordinator consulted - should glucoses remain elevated. May benefit from SGLT-2inhibitior if he gets insurance   SSI - on 0-9 scale  Glucoses stabilized   Likely hypercoagulable state associated with uncontrolled DM  Dysphagia . Secondary to stroke . Speech on board . cleared for Dysphagia 3 nectar thick liquids with MBS-> D2 nectar thick . Encourage po intake . Continue IVF @ 34 -  d/c on discharge  UTI with fever, resolved  UA showed WBC > 50  Urine culture enterobactor and staph coag neg  Sensitive to cipro  Finished cipro 500mg  bid course (ends 9/8)  Fever resolved   Other Stroke Risk Factors   hx of stroke - small remote appearing infarcts in the left frontal lobe by CT and MRI  Other Active Problems  Microcytic anemia - iron 38, Ferritin 115 - put on iron tab bid for 14 days.   Pt found on floor 04/23/19 by nursing staff. No obvious injury, complaints of pain, or neurologic changes.   Hospital day # 21 Patient remains difficult to place and await insurance approval and nursing home bed and [place].  Medically stable for transfer  Rodney Contras, MD  To contact Stroke Continuity provider, please refer to [email or web address] After hours, contact General Neurology

## 2019-05-06 LAB — GLUCOSE, CAPILLARY
Glucose-Capillary: 129 mg/dL — ABNORMAL HIGH (ref 70–99)
Glucose-Capillary: 209 mg/dL — ABNORMAL HIGH (ref 70–99)
Glucose-Capillary: 117 mg/dL — ABNORMAL HIGH (ref 70–99)
Glucose-Capillary: 148 mg/dL — ABNORMAL HIGH (ref 70–99)
Glucose-Capillary: 156 mg/dL — ABNORMAL HIGH (ref 70–99)

## 2019-05-06 NOTE — Progress Notes (Signed)
STROKE TEAM PROGRESS NOTE   INTERVAL HISTORY Patient is lying comfortably in bed..     No complaints.  Vital signs stable.  Vitals:   05/05/19 2257 05/06/19 0325 05/06/19 0832 05/06/19 1231  BP: 136/87 126/75 127/75 135/85  Pulse: 92 87 87 92  Resp: 20 16    Temp: 98.9 F (37.2 C) 98.2 F (36.8 C) 98.5 F (36.9 C) 98.9 F (37.2 C)  TempSrc: Oral Oral Oral Oral  SpO2: 99% 99% 100% 100%  Weight:      Height:        CBC:  Recent Labs  Lab 04/30/19 1930 05/04/19 1911  WBC 7.0 6.2  HGB 12.4* 12.3*  HCT 39.1 38.9*  MCV 72.0* 72.4*  PLT 491* 466*    Basic Metabolic Panel:  Recent Labs  Lab 04/30/19 1930 05/04/19 1911  NA 135 138  K 3.9 3.8  CL 102 104  CO2 20* 23  GLUCOSE 101* 135*  BUN 10 8  CREATININE 0.75 0.75  CALCIUM 9.3 9.4    IMAGING CT Head WO Contrast 04/16/2019 Areas of acute infarction are showing low-density and swelling but no macroscopic hemorrhage by CT. As shown by previous MRI, there is involvement of the cerebellum, left cerebral peduncle and thalamus and left posteromedial temporal lobe and occipital lobe. Tiny focus of infarction in the right occipital lobe as well.  [name] 04/15/2019 1. Acute infarction throughout the left PCA territory (including most of the left thalamus), left more than right cerebellum, and right pons. Petechial hemorrhage is seen at the level of the pons.  2. Prior left frontal infarcts and microvascular ischemic gliosis.   Cerebral angiogram 04/14/2019 4 vessel cerebral arteriogram followed by complete revascularization of occluded Lt PCA  With x 2 passes with 5mm x 33mm embotrap retriever  device achieving a TICI 3 revascularization.  Ct Code Stroke Cta Head W/wo Contrast Ct Code Stroke Cta Neck W/wo Contrast Ct Code Stroke Cta Cerebral Perfusion W/wo Contrast 04/14/2019 1. Left P2 occlusion with downstream reconstitution. Qualitative assessment of CT perfusion maps shows ischemia without visible core  infarct in the left occipital lobe.  2. Mild atherosclerosis in the neck without flow limiting stenosis or embolic source seen.  3. No large vessel occlusion in the anterior circulation.  4. Mild mid basilar narrowing.   Ct Head Code Stroke Wo Contrast 04/14/2019 1. No acute finding.  2. Small remote appearing infarcts in the left frontal lobe.    LE Doppler 04/15/2019 Right: There is no evidence of deep vein thrombosis in the lower extremity. No cystic structure found in the popliteal fossa. Left: There is no evidence of deep vein thrombosis in the lower extremity. No cystic structure found in the popliteal fossa.  2D Echocardiogram 04/15/2019  1. The left ventricle has normal systolic function, with an ejection fraction of 55-60%. The cavity size was normal. Left ventricular diastolic parameters were normal. No evidence of left ventricular regional wall motion abnormalities.  2. The right ventricle has normal systolic function. The cavity was normal. There is no increase in right ventricular wall thickness.  3. The mitral valve is abnormal. Mild thickening of the mitral valve leaflet. The MR jet is posteriorly-directed.  4. The tricuspid valve is grossly normal.  5. The aortic valve is tricuspid. No stenosis of the aortic valve.  6. The aorta is normal unless otherwise noted.  7. The inferior vena cava was normal in size with <50% respiratory variability. SUMMARY LVEF 55-60%, normal wall thickness,  normal wall motion, normal diastolic function, mild posteriorly directed MR, normal size IVC that does not collapse, no PFO by color doppler  FINDINGS  Left Ventricle: The left ventricle has normal systolic function, with an ejection fraction of 55-60%. The cavity size was normal. There is no increase in left ventricular wall thickness. Left ventricular diastolic parameters were normal. Normal left ventricular filling pressures No evidence of left ventricular regional wall motion  abnormalities..  EEG  04/15/2019 This study is suggestive of cortical dysfunction in the left hemisphere secondary to underlying stroke as well as mild diffuse encephalopathy. No seizures or definite epileptiform discharges were seen throughout the recording.  TCD w/ bubble 04/18/2019  No HTIS heard during rest. No HITS heard during valsalva.  TEE 04/19/2019 LVEF 55-60% No LA/LAA thrombus or mass No ASD or PFO by color flow Doppler or saline microcavitation study. Mild AR, mild MR.   PHYSICAL EXAM       General - Well nourished, well developed, African gentleman who is not in distress  Ophthalmologic - fundi not visualized due to noncooperation.  Cardiovascular - Regular rate and rhythm.  Neuro - awake, alert, eyes open, following most of the simple commands. Anarthria.  Dysconjugate eyes, left eye CN III palsy with pupil sparing, right eye CN VI palsy, PERRL. Not blinking to visual threat on the right. Right facial droop, tongue protrusion not cooperative, against gravity of LUE but significant ataxia, able to follow simple commands with left hand. lifts left leg slightly off of the bed on command, follow simple commands of the left foot. On pain stimulation, slight withdraw of RLE but no movement of RUE. Increased muscle tone on the RUE. Positive triple reflex on the right. Sensation and gait not tested.   ASSESSMENT/PLAN [name] with no significant past medical history presenting with right-sided weakness, decreased sensation on the right, diplopia and headache. Complete revascularization of occluded L PCA w/ mechanical thrombectomy.   Stroke: embolic multifocal posterior circulation infarcts with left P2 occlusion s/p IR with TICI3 reperfusion - likely embolic   Code Stroke CT head No acute abnormality. Old infarct L frontal lobe.   CTA head & neck L P2 occlusion. Mild neck atherosclerosis . No LVO anterior circulation. Mild mid BA narrowing.  CT  perfusion ischemia w/o visible core  CT Head WO Contrast - 04/16/19 - Areas of acute infarction are showing low-density and swelling but no macroscopic hemorrhage by CT.  As shown by previous MRI, there is involvement of the cerebellum, left cerebral peduncle and thalamus and left posteromedial temporal lobe and occipital lobe. Tiny focus of infarction in the right occipital lobe as well.  Cerebral angio TICI reperfusion of occluded L PCA  MRI  L PCA (including thalamus), L>R cerebellum, R pontine and left midbrain infarct. Petechial hemorrhage seen in pons. Old L frontal infarcts.   LE Doppler no DVT - no evidence of deep vein thrombosis   2D Echo - EF 55-60%.  No cardiac source of emboli identified.   TCD bubble no HITS  TEE neg  UDS - pos for benzos - UDS done on 9/1  EEG - cortical dysfunction but no seizures.  LDL 88  HgbA1c 12.3  Lovenox 40 mg sq daily  for VTE prophylaxis  No antithrombotic prior to admission, now on aspirin 81 mg daily and clopidogrel 75 mg daily x 3 weeks, then aspirin alone. (Plavix started 04/21/19 - last dose 05/12/19)  Therapy recommendations:  CIR -> D/t  lack of post hospitalization family support - will pursue SNF - will need rapid covid testing prior to D/C  Medically ready for d/c when bed found  Disposition:  pending (SW trying to help family w/ placement, [place] MCD and transfer back to [place])  Possible seizure  Full body rigid shaking hours post IR, L>R  Loaded with keppra  EEG - cortical dysfunction but no seizures.  Continue keppra 500mg  bid  Hypertension  No hx of HTN, on no home meds  Placed On cleviprex gtt for BP control post IR, now off . On amlodipine 10, off lotensin 5  . BP 110-120s . BP goal normotensive  Hyperlipidemia  Home meds:  No statin  LDL 88, goal < 70  HDL 39  On lipitor 20  Continue statin at discharge  Diabetes type II, new diagnosis, Uncontrolled Hyperglycemia, improving  HgbA1c 12.3, goal <  7.0  CBGs  on metformin 1000mg  bid (off glipizde to 5 mg bid d/t hypoglycemia)  DM coordinator consulted - should glucoses remain elevated. May benefit from SGLT-2inhibitior if he gets insurance   SSI - on 0-9 scale  Glucoses stabilized   Likely hypercoagulable state associated with uncontrolled DM  Dysphagia . Secondary to stroke . Speech on board . cleared for Dysphagia 3 nectar thick liquids with MBS-> D2 nectar thick . Encourage po intake . Continue IVF @ 40 -  d/c on discharge  UTI with fever, resolved  UA showed WBC > 50  Urine culture enterobactor and staph coag neg  Sensitive to cipro  Finished cipro 500mg  bid course (ends 9/8)  Fever resolved   Other Stroke Risk Factors   hx of stroke - small remote appearing infarcts in the left frontal lobe by CT and MRI  Other Active Problems  Microcytic anemia - iron 38, Ferritin 115 - put on iron tab bid for 14 days.   Pt found on floor 04/23/19 by nursing staff. No obvious injury, complaints of pain, or neurologic changes.   Hospital day # 22 Patient remains difficult to place and await insurance approval and nursing home bed and [place] out of state.  Medically stable for transfer  Delia Heady, MD  To contact Stroke Continuity provider, please refer to [email or web address]. After hours, contact General Neurology

## 2019-05-06 NOTE — Progress Notes (Signed)
  Speech Language Pathology Treatment: Cognitive-Linquistic  Patient Details Name: Rodney Collier MRN: [id number] DOB: [birth year] Today's Date: 05/06/2019 Time: 0737-1062 SLP Time Calculation (min) (ACUTE ONLY): 35 min  Assessment / Plan / Recommendation Clinical Impression  Pt seen for skilled ST intervention targeting goals for improved communication effectiveness. Pt able to produce adequate voicing with min encouragement, but will speak at whisper level if not cued to use his voice. Receptively, pt is able to answer yes/no questions with 70% accuracy. He usually responds with head nod/shake, but is able to verbalize "yes" and "no" intelligibly. Pt follows one step verbal directions with 60% accuracy. Apraxia decreases accuracy. Expressively, pt is able to produce automatic sequences with 1:1 visual and verbal assistance. Neologistic paraphasias and perseveration are noted throughout the task. Pt is able to repeat all vowel sounds, and 1 to 5 syllable words with good intelligibility. Accuracy decreases at sentence level, with increased paraphasic errors noted. Pt completes high probability phrases with 30% accuracy. Confrontational naming 1% accurate. Pt unable to answer responsive naming questions intelligibly.  Pt continues to exhibit significant aphasia and apraxia, but is making good progress and participates well in therapy. Continued skilled ST intervention is recommended acutely and at next venue.   HPI HPI: Pt is a 53 y.o. M with no known PMH who presents with right sided weakness, decreased sensation on right, diplopia and headache. CT showing remote L frontal infarcts, CTA head/neck with L P2 occlusion, MRI with acute L PCA infarct including much of L thalamus, L > R cerebellum, R pons. Now s/p complete revascularization of occluded L PCA with mechanical thrombectomy. ETT 8/27-8/28; self extubated 8/28.      SLP Plan  Continue with current plan of care;Goals updated        Recommendations  ST services at next level of care.                General recommendations: Rehab consult Oral Care Recommendations: Oral care BID Follow up Recommendations: Inpatient Rehab;24 hour supervision/assistance SLP Visit Diagnosis: Aphasia (R47.01);Apraxia (R48.2) Plan: Continue with current plan of care;Goals updated       [place] B. Quentin Ore [facility], CCC-SLP Speech Language Pathologist [phone]  [name] 05/06/2019, 10:28 AM

## 2019-05-07 DIAGNOSIS — E1165 Type 2 diabetes mellitus with hyperglycemia: Secondary | ICD-10-CM

## 2019-05-07 LAB — GLUCOSE, CAPILLARY
Glucose-Capillary: 139 mg/dL — ABNORMAL HIGH (ref 70–99)
Glucose-Capillary: 142 mg/dL — ABNORMAL HIGH (ref 70–99)
Glucose-Capillary: 139 mg/dL — ABNORMAL HIGH (ref 70–99)
Glucose-Capillary: 132 mg/dL — ABNORMAL HIGH (ref 70–99)
Glucose-Capillary: 95 mg/dL (ref 70–99)

## 2019-05-07 LAB — BASIC METABOLIC PANEL
Anion gap: 11 (ref 5–15)
BUN: 9 mg/dL (ref 6–20)
CO2: 22 mmol/L (ref 22–32)
Calcium: 9.4 mg/dL (ref 8.9–10.3)
Chloride: 105 mmol/L (ref 98–111)
Creatinine, Ser: 0.69 mg/dL (ref 0.61–1.24)
GFR calc Af Amer: >60 mL/min (ref >60)
GFR calc non Af Amer: >60 mL/min (ref >60)
Glucose, Bld: 103 mg/dL — ABNORMAL HIGH (ref 70–99)
Potassium: 3.9 mmol/L (ref 3.5–5.1)
Sodium: 138 mmol/L (ref 135–145)

## 2019-05-07 LAB — CBC
HCT: 39 % (ref 39.0–52.0)
Hemoglobin: 12.4 g/dL — ABNORMAL LOW (ref 13.0–17.0)
MCH: 23.3 pg — ABNORMAL LOW (ref 26.0–34.0)
MCHC: 31.8 g/dL (ref 30.0–36.0)
MCV: 73.2 fL — ABNORMAL LOW (ref 80.0–100.0)
Platelets: 430 10*3/uL — ABNORMAL HIGH (ref 150–400)
RBC: 5.33 MIL/uL (ref 4.22–5.81)
RDW: 14.1 % (ref 11.5–15.5)
WBC: 6.6 10*3/uL (ref 4.0–10.5)
nRBC: 0 % (ref 0.0–0.2)

## 2019-05-07 MED ORDER — BENAZEPRIL HCL 20 MG PO TABS
10.0000 mg | ORAL_TABLET | Freq: Two times a day (BID) | ORAL | Status: DC
  Administered 2019-05-07 – 2019-06-11 (×67): 10 mg via ORAL
  Filled 2019-05-07 (×82): qty 1

## 2019-05-07 NOTE — TOC Progression Note (Signed)
Transition of Care [facility]) - Progression Note    Patient Details  Name: Rodney Collier MRN: [id number] Date of Birth: Apr 27, 1966  Transition of Care [facility]) CM/SW [place], [place] Phone Number: 05/07/2019, 1:59 PM  Clinical Narrative:   CSW received update from daughter to reach out to [facility] [phone]). CSW contacted them, discussed the patient's case, and sent referral. CSW met with patient's daughter at bedside to discuss progress and that we are awaiting a response back from [place]. Daughter has to return to [place] tomorrow but will be available via phone. Daughter will continue to research other options available and will let CSW know to reach out to anywhere else.  CSW received call back from [facility] to discuss referral. [facility] is an inpatient rehab center, and they note that there is a SNF recommendation within the PT notes. Also, within the state of [place], there needs to be a legal healthcare proxy in order for any post-acute care admission. CSW unaware of any legal documentation for patient's HCPOA. If patient does not have a POA, then family will have to apply for guardianship in [place] in order for consents to be signed. CSW to discuss with therapy on if patient would be appropriate for an inpatient rehab setting vs SNF, and to discuss guardianship with the daughter.    Expected Discharge Plan: [place] Barriers to Discharge: Continued Medical Work up, Inadequate or no insurance  Expected Discharge Plan and Services Expected Discharge Plan: [place] Acute Care Choice: [place]                                         Social Determinants of Health (SDOH) Interventions    Readmission Risk Interventions No flowsheet data found.

## 2019-05-07 NOTE — Progress Notes (Signed)
STROKE TEAM PROGRESS NOTE   INTERVAL HISTORY Patient is lying comfortably in bed. No complaints.  Vital signs stable.  Still has right hemiplegia and a cranial nerve palsy.  Still on nectar thick liquid and gentle IV fluid.  Vitals:   05/06/19 1603 05/06/19 1941 05/06/19 2324 05/07/19 0352  BP: (!) 145/91 135/80 (!) 158/96 (!) 151/89  Pulse: (!) 103 (!) 101 98 82  Resp:  18 18 18   Temp: 98.8 F (37.1 C) 98.6 F (37 C) 98.6 F (37 C) 98.7 F (37.1 C)  TempSrc: Oral Oral Oral Oral  SpO2: 99% 97% 99% 99%  Weight:      Height:        CBC:  Recent Labs  Lab 04/30/19 1930 05/04/19 1911  WBC 7.0 6.2  HGB 12.4* 12.3*  HCT 39.1 38.9*  MCV 72.0* 72.4*  PLT 491* 466*    Basic Metabolic Panel:  Recent Labs  Lab 04/30/19 1930 05/04/19 1911  NA 135 138  K 3.9 3.8  CL 102 104  CO2 20* 23  GLUCOSE 101* 135*  BUN 10 8  CREATININE 0.75 0.75  CALCIUM 9.3 9.4    IMAGING CT Head WO Contrast 04/16/2019 Areas of acute infarction are showing low-density and swelling but no macroscopic hemorrhage by CT. As shown by previous MRI, there is involvement of the cerebellum, left cerebral peduncle and thalamus and left posteromedial temporal lobe and occipital lobe. Tiny focus of infarction in the right occipital lobe as well.  [name] 04/15/2019 1. Acute infarction throughout the left PCA territory (including most of the left thalamus), left more than right cerebellum, and right pons. Petechial hemorrhage is seen at the level of the pons.  2. Prior left frontal infarcts and microvascular ischemic gliosis.   Cerebral angiogram 04/14/2019 4 vessel cerebral arteriogram followed by complete revascularization of occluded Lt PCA  With x 2 passes with 5mm x 33mm embotrap retriever  device achieving a TICI 3 revascularization.  Ct Code Stroke Cta Head W/wo Contrast Ct Code Stroke Cta Neck W/wo Contrast Ct Code Stroke Cta Cerebral Perfusion W/wo Contrast 04/14/2019 1. Left P2  occlusion with downstream reconstitution. Qualitative assessment of CT perfusion maps shows ischemia without visible core infarct in the left occipital lobe.  2. Mild atherosclerosis in the neck without flow limiting stenosis or embolic source seen.  3. No large vessel occlusion in the anterior circulation.  4. Mild mid basilar narrowing.   Ct Head Code Stroke Wo Contrast 04/14/2019 1. No acute finding.  2. Small remote appearing infarcts in the left frontal lobe.    LE Doppler 04/15/2019 Right: There is no evidence of deep vein thrombosis in the lower extremity. No cystic structure found in the popliteal fossa. Left: There is no evidence of deep vein thrombosis in the lower extremity. No cystic structure found in the popliteal fossa.  2D Echocardiogram 04/15/2019  1. The left ventricle has normal systolic function, with an ejection fraction of 55-60%. The cavity size was normal. Left ventricular diastolic parameters were normal. No evidence of left ventricular regional wall motion abnormalities.  2. The right ventricle has normal systolic function. The cavity was normal. There is no increase in right ventricular wall thickness.  3. The mitral valve is abnormal. Mild thickening of the mitral valve leaflet. The MR jet is posteriorly-directed.  4. The tricuspid valve is grossly normal.  5. The aortic valve is tricuspid. No stenosis of the aortic valve.  6. The aorta is normal unless otherwise  noted.  7. The inferior vena cava was normal in size with <50% respiratory variability. SUMMARY LVEF 55-60%, normal wall thickness, normal wall motion, normal diastolic function, mild posteriorly directed MR, normal size IVC that does not collapse, no PFO by color doppler  FINDINGS  Left Ventricle: The left ventricle has normal systolic function, with an ejection fraction of 55-60%. The cavity size was normal. There is no increase in left ventricular wall thickness. Left ventricular diastolic parameters  were normal. Normal left ventricular filling pressures No evidence of left ventricular regional wall motion abnormalities..  EEG  04/15/2019 This study is suggestive of cortical dysfunction in the left hemisphere secondary to underlying stroke as well as mild diffuse encephalopathy. No seizures or definite epileptiform discharges were seen throughout the recording.  TCD w/ bubble 04/18/2019  No HTIS heard during rest. No HITS heard during valsalva.  TEE 04/19/2019 LVEF 55-60% No LA/LAA thrombus or mass No ASD or PFO by color flow Doppler or saline microcavitation study. Mild AR, mild MR.   PHYSICAL EXAM       General - Well nourished, well developed, African gentleman who is not in distress  Ophthalmologic - fundi not visualized due to noncooperation.  Cardiovascular - Regular rate and rhythm.  Neuro - awake, alert, eyes open, following most of the simple commands. Anarthria.  Dysconjugate eyes, left eye CN III palsy with pupil sparing, right eye CN VI palsy, PERRL. Not blinking to visual threat on the right. Right facial droop, tongue protrusion not cooperative, against gravity of LUE but significant ataxia, able to follow simple commands with left hand. lifts left leg slightly off of the bed on command, follow simple commands of the left foot. On pain stimulation, slight withdraw of RLE but no movement of RUE. Increased muscle tone on the RUE. Positive triple reflex on the right. Sensation and gait not tested.   ASSESSMENT/PLAN [name] with no significant past medical history presenting with right-sided weakness, decreased sensation on the right, diplopia and headache. Complete revascularization of occluded L PCA w/ mechanical thrombectomy.  Stroke: embolic multifocal posterior circulation infarcts with left P2 occlusion s/p IR with TICI3 reperfusion - likely embolic   Code Stroke CT head No acute abnormality. Old infarct L frontal lobe.   CTA head & neck  L P2 occlusion. Mild neck atherosclerosis . No LVO anterior circulation. Mild mid BA narrowing.  CT perfusion ischemia w/o visible core  CT Head - 04/16/19 - Areas of acute infarction are showing low-density and swelling but no macroscopic hemorrhage by CT.    Cerebral angio TICI3 reperfusion of occluded L PCA  MRI  L PCA (including thalamus), L>R cerebellum, R pontine and left midbrain infarct. Petechial hemorrhage seen in pons. Old L frontal infarcts.   LE Doppler no DVT - no evidence of deep vein thrombosis   2D Echo - EF 55-60%.  No cardiac source of emboli identified.   TCD bubble no HITS  TEE neg  UDS - pos for benzos - UDS done on 9/1  EEG - cortical dysfunction but no seizures.  LDL 88  HgbA1c 12.3  Lovenox 40 mg sq daily  for VTE prophylaxis  No antithrombotic prior to admission, now on aspirin 81 mg daily and clopidogrel 75 mg daily x 3 weeks, then aspirin alone. (Plavix started 04/21/19 - last dose 05/12/19)  Therapy recommendations:  CIR -> D/t lack of post hospitalization family support - will pursue SNF - will need rapid covid testing  prior to D/C  Medically ready for d/c when bed found  Disposition:  pending (SW trying to help family w/ placement, [place] MCD and [place] back to [place])  Possible seizure  Full body rigid shaking hours post IR, L>R  Loaded with keppra  EEG - cortical dysfunction but no seizures.  Continue keppra 500mg  bid  Hypertension  No hx of HTN, on no home meds  Placed On cleviprex gtt for BP control post IR, now off . continue amlodipine 10 . increase lotensin to 10mg  bid  . BP on the higher end . BP goal normotensive  Hyperlipidemia  Home meds:  No statin  LDL 88, goal < 70  HDL 39  On lipitor 20  Continue statin at discharge  Diabetes type II, new diagnosis, Uncontrolled Hyperglycemia, improving  HgbA1c 12.3, goal < 7.0  CBGs  continue metformin 1000mg  bid (off glipizde to 5 mg bid d/t hypoglycemia)  DM  coordinator consulted - should glucoses remain elevated. May benefit from SGLT-2inhibitior if he gets insurance   SSI - on 0-9 scale  Glucoses stabilized   Likely hypercoagulable state associated with uncontrolled DM  Dysphagia . Secondary to stroke . Speech on board . On D2 nectar thick . Encourage po intake . Continue IVF @ 40 -  d/c on discharge  UTI with fever, resolved  UA showed WBC > 50  Urine culture enterobactor and staph coag neg  Sensitive to cipro  Finished cipro 500mg  bid course (ends 9/8)  Fever resolved   Other Stroke Risk Factors   hx of stroke - small remote appearing infarcts in the left frontal lobe by CT and MRI  Other Active Problems  Microcytic anemia - iron 38, Ferritin 115 - put on iron tab bid for 14 days.   Pt found on floor 04/23/19 by nursing staff. No obvious injury, complaints of pain, or neurologic changes.   Hospital day # 23  [place]vel Plan, MD PhD Stroke Neurology 05/07/2019 1:10 PM   To contact Stroke Continuity provider, please refer to [email or web address]. After hours, contact General Neurology

## 2019-05-07 NOTE — TOC Progression Note (Signed)
LATE NOTE SUBMISSION   Transition of Care [facility]) - Progression Note    Patient Details  Name: Rodney Collier MRN: [id number] Date of Birth: 09-Jul-1966  Transition of Care [facility]) CM/SW [place], [place] Phone Number: 05/07/2019, 1:57 PM  Clinical Narrative:   CSW spoke with patient's daughter [name] to discuss discharge plan. Patient's daughter has applied for Medicaid in [place] and patient has already been approved. CSW discussed with daughter the need to research rehab facilities and provide information to CSW to reach out to, as CSW is unfamiliar with any facilities in [place]. Daughter to research and provide information.     Expected Discharge Plan: [place] Barriers to Discharge: Continued Medical Work up, Inadequate or no insurance  Expected Discharge Plan and Services Expected Discharge Plan: [place] Acute Care Choice: [place]                                         Social Determinants of Health (SDOH) Interventions    Readmission Risk Interventions No flowsheet data found.

## 2019-05-08 LAB — BASIC METABOLIC PANEL
Anion gap: 13 (ref 5–15)
BUN: 9 mg/dL (ref 6–20)
CO2: 23 mmol/L (ref 22–32)
Calcium: 9.4 mg/dL (ref 8.9–10.3)
Chloride: 100 mmol/L (ref 98–111)
Creatinine, Ser: 0.71 mg/dL (ref 0.61–1.24)
GFR calc Af Amer: >60 mL/min (ref >60)
GFR calc non Af Amer: >60 mL/min (ref >60)
Glucose, Bld: 126 mg/dL — ABNORMAL HIGH (ref 70–99)
Potassium: 3.8 mmol/L (ref 3.5–5.1)
Sodium: 136 mmol/L (ref 135–145)

## 2019-05-08 LAB — CBC
HCT: 38.7 % — ABNORMAL LOW (ref 39.0–52.0)
Hemoglobin: 12.2 g/dL — ABNORMAL LOW (ref 13.0–17.0)
MCH: 23 pg — ABNORMAL LOW (ref 26.0–34.0)
MCHC: 31.5 g/dL (ref 30.0–36.0)
MCV: 73 fL — ABNORMAL LOW (ref 80.0–100.0)
Platelets: 423 10*3/uL — ABNORMAL HIGH (ref 150–400)
RBC: 5.3 MIL/uL (ref 4.22–5.81)
RDW: 14.2 % (ref 11.5–15.5)
WBC: 5.9 10*3/uL (ref 4.0–10.5)
nRBC: 0 % (ref 0.0–0.2)

## 2019-05-08 LAB — GLUCOSE, CAPILLARY
Glucose-Capillary: 209 mg/dL — ABNORMAL HIGH (ref 70–99)
Glucose-Capillary: 121 mg/dL — ABNORMAL HIGH (ref 70–99)
Glucose-Capillary: 121 mg/dL — ABNORMAL HIGH (ref 70–99)
Glucose-Capillary: 137 mg/dL — ABNORMAL HIGH (ref 70–99)

## 2019-05-08 NOTE — Progress Notes (Signed)
STROKE TEAM PROGRESS NOTE   INTERVAL HISTORY Patient is lying comfortably in bed. No complaints.  Vital signs stable.  No neuro changes.  Pending SNF  Vitals:   05/08/19 0349 05/08/19 0853 05/08/19 0853 05/08/19 1254  BP: 128/83 137/88 137/88 131/83  Pulse: 78  89 87  Resp: 18  17 18   Temp: 98.3 F (36.8 C)  98.8 F (37.1 C) 98.4 F (36.9 C)  TempSrc:   Oral Oral  SpO2: 98%  99% 100%  Weight:      Height:        CBC:  Recent Labs  Lab 05/07/19 1959 05/08/19 0357  WBC 6.6 5.9  HGB 12.4* 12.2*  HCT 39.0 38.7*  MCV 73.2* 73.0*  PLT 430* 423*    Basic Metabolic Panel:  Recent Labs  Lab 05/07/19 1959 05/08/19 0357  NA 138 136  K 3.9 3.8  CL 105 100  CO2 22 23  GLUCOSE 103* 126*  BUN 9 9  CREATININE 0.69 0.71  CALCIUM 9.4 9.4    IMAGING CT Head WO Contrast 04/16/2019 Areas of acute infarction are showing low-density and swelling but no macroscopic hemorrhage by CT. As shown by previous MRI, there is involvement of the cerebellum, left cerebral peduncle and thalamus and left posteromedial temporal lobe and occipital lobe. Tiny focus of infarction in the right occipital lobe as well.  [name] 04/15/2019 1. Acute infarction throughout the left PCA territory (including most of the left thalamus), left more than right cerebellum, and right pons. Petechial hemorrhage is seen at the level of the pons.  2. Prior left frontal infarcts and microvascular ischemic gliosis.   Cerebral angiogram 04/14/2019 4 vessel cerebral arteriogram followed by complete revascularization of occluded Lt PCA  With x 2 passes with 66mm x 75mm embotrap retriever  device achieving a TICI 3 revascularization.  Ct Code Stroke Cta Head W/wo Contrast Ct Code Stroke Cta Neck W/wo Contrast Ct Code Stroke Cta Cerebral Perfusion W/wo Contrast 04/14/2019 1. Left P2 occlusion with downstream reconstitution. Qualitative assessment of CT perfusion maps shows ischemia without visible core  infarct in the left occipital lobe.  2. Mild atherosclerosis in the neck without flow limiting stenosis or embolic source seen.  3. No large vessel occlusion in the anterior circulation.  4. Mild mid basilar narrowing.   Ct Head Code Stroke Wo Contrast 04/14/2019 1. No acute finding.  2. Small remote appearing infarcts in the left frontal lobe.    LE Doppler 04/15/2019 Right: There is no evidence of deep vein thrombosis in the lower extremity. No cystic structure found in the popliteal fossa. Left: There is no evidence of deep vein thrombosis in the lower extremity. No cystic structure found in the popliteal fossa.  2D Echocardiogram 04/15/2019  1. The left ventricle has normal systolic function, with an ejection fraction of 55-60%. The cavity size was normal. Left ventricular diastolic parameters were normal. No evidence of left ventricular regional wall motion abnormalities.  2. The right ventricle has normal systolic function. The cavity was normal. There is no increase in right ventricular wall thickness.  3. The mitral valve is abnormal. Mild thickening of the mitral valve leaflet. The MR jet is posteriorly-directed.  4. The tricuspid valve is grossly normal.  5. The aortic valve is tricuspid. No stenosis of the aortic valve.  6. The aorta is normal unless otherwise noted.  7. The inferior vena cava was normal in size with <50% respiratory variability. SUMMARY LVEF 55-60%, normal wall thickness,  normal wall motion, normal diastolic function, mild posteriorly directed MR, normal size IVC that does not collapse, no PFO by color doppler  FINDINGS  Left Ventricle: The left ventricle has normal systolic function, with an ejection fraction of 55-60%. The cavity size was normal. There is no increase in left ventricular wall thickness. Left ventricular diastolic parameters were normal. Normal left ventricular filling pressures No evidence of left ventricular regional wall motion  abnormalities..  EEG  04/15/2019 This study is suggestive of cortical dysfunction in the left hemisphere secondary to underlying stroke as well as mild diffuse encephalopathy. No seizures or definite epileptiform discharges were seen throughout the recording.  TCD w/ bubble 04/18/2019  No HTIS heard during rest. No HITS heard during valsalva.  TEE 04/19/2019 LVEF 55-60% No LA/LAA thrombus or mass No ASD or PFO by color flow Doppler or saline microcavitation study. Mild AR, mild MR.   PHYSICAL EXAM       General - Well nourished, well developed, African gentleman who is not in distress  Ophthalmologic - fundi not visualized due to noncooperation.  Cardiovascular - Regular rate and rhythm.  Neuro - awake, alert, eyes open, following most of the simple commands. Anarthria.  Dysconjugate eyes, left eye CN III palsy with pupil sparing, right eye CN VI palsy, PERRL. Not blinking to visual threat on the right. Right facial droop, tongue protrusion not cooperative, against gravity of LUE but significant ataxia, able to follow simple commands with left hand. lifts left leg slightly off of the bed on command, follow simple commands of the left foot. On pain stimulation, slight withdraw of RLE but no movement of RUE. Increased muscle tone on the RUE. Positive triple reflex on the right. Sensation and gait not tested.   ASSESSMENT/PLAN [name] with no significant past medical history presenting with right-sided weakness, decreased sensation on the right, diplopia and headache. Complete revascularization of occluded L PCA w/ mechanical thrombectomy.  Stroke: embolic multifocal posterior circulation infarcts with left P2 occlusion s/p IR with TICI3 reperfusion - likely embolic   Code Stroke CT head No acute abnormality. Old infarct L frontal lobe.   CTA head & neck L P2 occlusion. Mild neck atherosclerosis . No LVO anterior circulation. Mild mid BA narrowing.  CT  perfusion ischemia w/o visible core  CT Head - 04/16/19 - Areas of acute infarction are showing low-density and swelling but no macroscopic hemorrhage by CT.    Cerebral angio TICI3 reperfusion of occluded L PCA  MRI  L PCA (including thalamus), L>R cerebellum, R pontine and left midbrain infarct. Petechial hemorrhage seen in pons. Old L frontal infarcts.   LE Doppler no DVT - no evidence of deep vein thrombosis   2D Echo - EF 55-60%.  No cardiac source of emboli identified.   TCD bubble no HITS  TEE neg  UDS - pos for benzos - UDS done on 9/1  EEG - cortical dysfunction but no seizures.  LDL 88  HgbA1c 12.3  Lovenox 40 mg sq daily  for VTE prophylaxis  No antithrombotic prior to admission, now on aspirin 81 mg daily and clopidogrel 75 mg daily x 3 weeks, then aspirin alone. (Plavix started 04/21/19 - last dose 05/12/19)  Therapy recommendations:  CIR -> D/t lack of post hospitalization family support - will pursue SNF - will need rapid covid testing prior to D/C  Medically ready for d/c when bed found  Disposition:  pending (SW trying to help family w/  placement, MA MCD and transfer back to [place])  Possible seizure  Full body rigid shaking hours post IR, L>R  Loaded with keppra  EEG - cortical dysfunction but no seizures.  Continue keppra 500mg  bid  Hypertension  No hx of HTN, on no home meds  Placed On cleviprex gtt for BP control post IR, now off . continue amlodipine 10 . increase lotensin to 10mg  bid  . BP on the higher end . BP goal normotensive  Hyperlipidemia  Home meds:  No statin  LDL 88, goal < 70  HDL 39  On lipitor 20  Continue statin at discharge  Diabetes type II, new diagnosis, Uncontrolled Hyperglycemia, improving  HgbA1c 12.3, goal < 7.0  CBGs  continue metformin 1000mg  bid (off glipizde to 5 mg bid d/t hypoglycemia)  DM coordinator consulted - should glucoses remain elevated. May benefit from SGLT-2inhibitior if he gets  insurance   SSI - on 0-9 scale  Glucoses stabilized   Likely hypercoagulable state associated with uncontrolled DM  Dysphagia . Secondary to stroke . Speech on board . On D2 nectar thick . Encourage po intake . Continue IVF @ 80 -  d/c on discharge  UTI with fever, resolved  UA showed WBC > 50  Urine culture enterobactor and staph coag neg  Sensitive to cipro  Finished cipro 500mg  bid course (ends 9/8)  Fever resolved   Other Stroke Risk Factors   hx of stroke - small remote appearing infarcts in the left frontal lobe by CT and MRI  Other Active Problems  Microcytic anemia - iron 38, Ferritin 115 - put on iron tab bid. Hb - 12.2  Pt found on floor 04/23/19 by nursing staff. No obvious injury, complaints of pain, or neurologic changes.   Hospital day # 24  Rosalin Hawking, MD PhD Stroke Neurology 05/08/2019 7:34 PM    To contact Stroke Continuity provider, please refer to [email or web address] After hours, contact General Neurology

## 2019-05-09 LAB — GLUCOSE, CAPILLARY
Glucose-Capillary: 117 mg/dL — ABNORMAL HIGH (ref 70–99)
Glucose-Capillary: 147 mg/dL — ABNORMAL HIGH (ref 70–99)
Glucose-Capillary: 135 mg/dL — ABNORMAL HIGH (ref 70–99)
Glucose-Capillary: 162 mg/dL — ABNORMAL HIGH (ref 70–99)
Glucose-Capillary: 85 mg/dL (ref 70–99)

## 2019-05-09 NOTE — Progress Notes (Signed)
SLP Cancellation Note  Patient Details Name: Rodney Collier MRN: [id number] DOB: February 03, 1966   Cancelled treatment:       Reason Eval/Treat Not Completed: Fatigue/lethargy limiting ability to participate(Pt was approached for SLP tx but was asleep and exhibited difficulty maintain an adequate level of alertness following arousal. SLP will follow up and re-attempt tx as able.)  [name] I. Hardin Negus, [place], [place] Office number [phone] Pager [place] 05/09/2019, 11:28 AM

## 2019-05-09 NOTE — TOC Progression Note (Signed)
Transition of Care [facility]) - Progression Note    Patient Details  Name: Raydin Bielinski MRN: [id number] Date of Birth: Feb 06, 1966  Transition of Care [facility]) CM/SW [place], [place] Phone Number: 05/09/2019, 12:40 PM  Clinical Narrative:   CSW researched SNF options in [place], [place] area for patient and family. CSW obtained list of facilities off Medicare.gov website. CSW spoke with daughter and sent list of facility options to [facility] via email. CSW discussed with Samalie what [place] said about needing a healthcare proxy on file. Per [name], she does not believe that her dad has ever legally indicated a HCPOA and she will have to talk with her family about who would be willing to do that. CSW indicated to [facility] that she's unsure if that would be across the board for any post-acute rehab in [place] or just for [place], can find out more as we contact more facilities.  CSW also reached out to PT to evaluate if patient would continue to be appropriate for a CIR environment, as there was previously a CIR recommendation but it was changed to SNF after CIR declined. Family has been researching inpatient rehab facilities and would like to get him in, but will need a recommendation from PT. PT to see how he does when they work with him today. CSW to follow.    Expected Discharge Plan: [place] Barriers to Discharge: Continued Medical Work up, Inadequate or no insurance  Expected Discharge Plan and Services Expected Discharge Plan: [place] Acute Care Choice: [place]                                         Social Determinants of Health (SDOH) Interventions    Readmission Risk Interventions No flowsheet data found.

## 2019-05-09 NOTE — Progress Notes (Signed)
Physical Therapy Treatment Patient Details Name: Rodney Collier MRN: [id number] DOB: August 08, 1966 Today's Date: 05/09/2019    History of Present Illness Pt is a 53 y.o. M with no known PMH who presents with right sided weakness, decreased sensation on right, diplopia and headache. CT showing remote L frontal infarcts, CTA head/neck with L P2 occlusion, MRI with acute L PCA infarct including much of L thalamus, L > R cerebellum, R pons. Now s/p complete revascularization of occluded L PCA with mechanical thrombectomy. ETT 8/27-8/28; self extubated 8/28.    PT Comments    Pt progressing towards physical therapy goals. Was able to initiate gait training this session, with +2 assist for balance support, and to facilitate weight shift and RLE advancement. Pt more engaged with session today however still appeared flat and grossly apathetic. Feel this patient would thrive more in a CIR environment vs. SNF, and would benefit to maximize functional independence and decrease burden of care on family upon return home. Continues to require multi-disciplinary therapies, and feel he could tolerate the intensity of a CIR level therapy program if given the opportunity. Will continue to follow acutely.    Follow Up Recommendations  CIR;Supervision/Assistance - 24 hour     Equipment Recommendations  Wheelchair (measurements PT);Wheelchair cushion (measurements PT);3in1 (PT);Hospital bed    Recommendations for Other Services Rehab consult     Precautions / Restrictions Precautions Precautions: Fall Precaution Comments: R hemiparesis and inattention Restrictions Weight Bearing Restrictions: No    Mobility  Bed Mobility Overal bed mobility: Needs Assistance Bed Mobility: Supine to Sit Rolling: Mod assist Sidelying to sit: Mod assist       General bed mobility comments: Pt utilizing railing with LUE and attempting to scoot around. Not able to fully roll and required assist to get to sidelying. HHA to  pull to sitting required. Pt was able to take 2 scoots back with R hip and get himself adjusted at EOB.   Transfers Overall transfer level: Needs assistance Equipment used: 2 person hand held assist Transfers: Sit to/from [employer] Sit to Stand: Mod assist;+2 physical assistance Stand pivot transfers: Mod assist;+2 physical assistance       General transfer comment: +2 assist to power-up to full stand and take pivotal steps around to the recliner chair. Pt required R knee block to advance LLE, and assist at R foot to advance around as well.   Ambulation/Gait Ambulation/Gait assistance: Mod assist;+2 physical assistance Gait Distance (Feet): 10 Feet(3-4 feet x3) Assistive device: 2 person hand held assist Gait Pattern/deviations: Step-to pattern;Decreased stride length;Decreased weight shift to right;Decreased weight shift to left;Trunk flexed Gait velocity: Decreased Gait velocity interpretation: <1.31 ft/sec, indicative of household ambulator General Gait Details: Pt was able to ambulate a total of ~10 feet with +2 assist and close chair follow. With faciliation of weight shift pt was able to advance LLE, and therapist assisted in advancement of R LE.    Stairs             Wheelchair Mobility    Modified Rankin (Stroke Patients Only) Modified Rankin (Stroke Patients Only) Pre-Morbid Rankin Score: No symptoms Modified Rankin: Moderately severe disability     Balance Overall balance assessment: Needs assistance Sitting-balance support: Single extremity supported;Feet supported Sitting balance-Leahy Scale: Fair Sitting balance - Comments: pt able to maintain static balance with close minguard assist, mostly reliant on LUE support. sitting balance improved with time. Postural control: Right lateral lean;Posterior lean Standing balance support: Single extremity supported Standing balance-Leahy Scale: Poor  Standing balance comment: able to maintain static  balance with LUE support and very close minguard assist, once fatigued pt requiring increased assist (up to modA), R lateral lean with standing requiring cues to correct                            Cognition Arousal/Alertness: Awake/alert Behavior During Therapy: Flat affect Overall Cognitive Status: Difficult to assess Area of Impairment: Attention;Safety/judgement;Problem solving                   Current Attention Level: Sustained Memory: Decreased recall of precautions Following Commands: Follows one step commands with increased time;Follows multi-step commands inconsistently Safety/Judgement: Decreased awareness of safety;Decreased awareness of deficits Awareness: Emergent Problem Solving: Difficulty sequencing;Requires verbal cues;Requires tactile cues General Comments: pt overall following commands, flat affect overall; pt attempting to communicate with therapist but unable to understanding words communicated, pt does intermittently nod his head yes/no      Exercises      General Comments        Pertinent Vitals/Pain Pain Assessment: Faces Faces Pain Scale: No hurt Pain Intervention(s): Monitored during session    Home Living                      Prior Function            PT Goals (current goals can now be found in the care plan section) Acute Rehab PT Goals Patient Stated Goal: unable PT Goal Formulation: Patient unable to participate in goal setting Time For Goal Achievement: 05/16/19 Potential to Achieve Goals: Good Progress towards PT goals: Progressing toward goals    Frequency    Min 3X/week      PT Plan Discharge plan needs to be updated    Co-evaluation              AM-PAC PT "6 Clicks" Mobility   Outcome Measure  Help needed turning from your back to your side while in a flat bed without using bedrails?: A Lot Help needed moving from lying on your back to sitting on the side of a flat bed without using  bedrails?: A Lot Help needed moving to and from a bed to a chair (including a wheelchair)?: A Lot Help needed standing up from a chair using your arms (e.g., wheelchair or bedside chair)?: A Lot Help needed to walk in hospital room?: Total Help needed climbing 3-5 steps with a railing? : Total 6 Click Score: 10    End of Session Equipment Utilized During Treatment: Gait belt Activity Tolerance: Patient tolerated treatment well Patient left: in chair;with chair alarm set;with call bell/phone within reach Nurse Communication: Mobility status;Need for lift equipment PT Visit Diagnosis: Unsteadiness on feet (R26.81);Muscle weakness (generalized) (M62.81);Difficulty in walking, not elsewhere classified (R26.2);Hemiplegia and hemiparesis Hemiplegia - Right/Left: Right Hemiplegia - dominant/non-dominant: Non-dominant Hemiplegia - caused by: Cerebral infarction     Time: 1610-9604 PT Time Calculation (min) (ACUTE ONLY): 25 min  Charges:  $Gait Training: 23-37 mins                     Rolinda Roan, PT, DPT Acute Rehabilitation Services Pager: [phone] Office: [phone]    Thelma Comp 05/09/2019, 1:54 PM

## 2019-05-09 NOTE — Progress Notes (Signed)
STROKE TEAM PROGRESS NOTE   INTERVAL HISTORY Patient is lying comfortably in bed, but frustrated that he missed a phone call. No neuro changes.  Still pending for SNF  Vitals:   05/08/19 1727 05/08/19 1933 05/09/19 0002 05/09/19 0352  BP: (!) 157/99 (!) 157/99 (!) 150/88 131/89  Pulse: 98 (!) 102 99 83  Resp: 18 15 15 15   Temp: 98.3 F (36.8 C) 99.6 F (37.6 C) 98.5 F (36.9 C) 98.2 F (36.8 C)  TempSrc: Oral Oral Oral Oral  SpO2: 100% 99% 96% 97%  Weight:      Height:        CBC:  Recent Labs  Lab 05/07/19 1959 05/08/19 0357  WBC 6.6 5.9  HGB 12.4* 12.2*  HCT 39.0 38.7*  MCV 73.2* 73.0*  PLT 430* 423*    Basic Metabolic Panel:  Recent Labs  Lab 05/07/19 1959 05/08/19 0357  NA 138 136  K 3.9 3.8  CL 105 100  CO2 22 23  GLUCOSE 103* 126*  BUN 9 9  CREATININE 0.69 0.71  CALCIUM 9.4 9.4    IMAGING  CT Head WO Contrast 04/16/2019 Areas of acute infarction are showing low-density and swelling but no macroscopic hemorrhage by CT. As shown by previous MRI, there is involvement of the cerebellum, left cerebral peduncle and thalamus and left posteromedial temporal lobe and occipital lobe. Tiny focus of infarction in the right occipital lobe as well.  [name] 04/15/2019 1. Acute infarction throughout the left PCA territory (including most of the left thalamus), left more than right cerebellum, and right pons. Petechial hemorrhage is seen at the level of the pons.  2. Prior left frontal infarcts and microvascular ischemic gliosis.   Cerebral angiogram 04/14/2019 4 vessel cerebral arteriogram followed by complete revascularization of occluded Lt PCA  With x 2 passes with 20mm x 2mm embotrap retriever  device achieving a TICI 3 revascularization.  Ct Code Stroke Cta Head W/wo Contrast Ct Code Stroke Cta Neck W/wo Contrast Ct Code Stroke Cta Cerebral Perfusion W/wo Contrast 04/14/2019 1. Left P2 occlusion with downstream reconstitution. Qualitative  assessment of CT perfusion maps shows ischemia without visible core infarct in the left occipital lobe.  2. Mild atherosclerosis in the neck without flow limiting stenosis or embolic source seen.  3. No large vessel occlusion in the anterior circulation.  4. Mild mid basilar narrowing.   Ct Head Code Stroke Wo Contrast 04/14/2019 1. No acute finding.  2. Small remote appearing infarcts in the left frontal lobe.    LE Doppler 04/15/2019 Right: There is no evidence of deep vein thrombosis in the lower extremity. No cystic structure found in the popliteal fossa. Left: There is no evidence of deep vein thrombosis in the lower extremity. No cystic structure found in the popliteal fossa.  2D Echocardiogram 04/15/2019  1. The left ventricle has normal systolic function, with an ejection fraction of 55-60%. The cavity size was normal. Left ventricular diastolic parameters were normal. No evidence of left ventricular regional wall motion abnormalities.  2. The right ventricle has normal systolic function. The cavity was normal. There is no increase in right ventricular wall thickness.  3. The mitral valve is abnormal. Mild thickening of the mitral valve leaflet. The MR jet is posteriorly-directed.  4. The tricuspid valve is grossly normal.  5. The aortic valve is tricuspid. No stenosis of the aortic valve.  6. The aorta is normal unless otherwise noted.  7. The inferior vena cava was normal in  size with <50% respiratory variability. SUMMARY LVEF 55-60%, normal wall thickness, normal wall motion, normal diastolic function, mild posteriorly directed MR, normal size IVC that does not collapse, no PFO by color doppler  FINDINGS  Left Ventricle: The left ventricle has normal systolic function, with an ejection fraction of 55-60%. The cavity size was normal. There is no increase in left ventricular wall thickness. Left ventricular diastolic parameters were normal. Normal left ventricular filling pressures  No evidence of left ventricular regional wall motion abnormalities..  EEG  04/15/2019 This study is suggestive of cortical dysfunction in the left hemisphere secondary to underlying stroke as well as mild diffuse encephalopathy. No seizures or definite epileptiform discharges were seen throughout the recording.  TCD w/ bubble 04/18/2019  No HTIS heard during rest. No HITS heard during valsalva.  TEE 04/19/2019 LVEF 55-60% No LA/LAA thrombus or mass No ASD or PFO by color flow Doppler or saline microcavitation study. Mild AR, mild MR.   PHYSICAL EXAM       General - Well nourished, well developed, African gentleman who is not in distress  Ophthalmologic - fundi not visualized due to noncooperation.  Cardiovascular - Regular rate and rhythm.  Neuro - awake, alert, eyes open, following most of the simple commands. Anarthria.  Dysconjugate eyes, left eye CN III palsy with pupil sparing, right eye CN VI palsy, PERRL. Not blinking to visual threat on the right. Right facial droop, tongue protrusion not cooperative, against gravity of LUE but significant ataxia, able to follow simple commands with left hand. lifts left leg slightly off of the bed on command, follow simple commands of the left foot. On pain stimulation, slight withdraw of RLE but no movement of RUE. Increased muscle tone on the RUE. Positive triple reflex on the right. Sensation and gait not tested.   ASSESSMENT/PLAN Rodney Collier is a 53 y.o. male with no significant past medical history presenting with right-sided weakness, decreased sensation on the right, diplopia and headache. Complete revascularization of occluded L PCA w/ mechanical thrombectomy.  Stroke: embolic multifocal posterior circulation infarcts with left P2 occlusion s/p IR with TICI3 reperfusion - likely embolic   Code Stroke CT head No acute abnormality. Old infarct L frontal lobe.   CTA head & neck L P2 occlusion. Mild neck atherosclerosis . No LVO  anterior circulation. Mild mid BA narrowing.  CT perfusion ischemia w/o visible core  CT Head - 04/16/19 - Areas of acute infarction are showing low-density and swelling but no macroscopic hemorrhage by CT.    Cerebral angio TICI3 reperfusion of occluded L PCA  MRI  L PCA (including thalamus), L>R cerebellum, R pontine and left midbrain infarct. Petechial hemorrhage seen in pons. Old L frontal infarcts.   LE Doppler no DVT - no evidence of deep vein thrombosis   2D Echo - EF 55-60%.  No cardiac source of emboli identified.   TCD bubble no HITS  TEE neg  UDS - pos for benzos - UDS done on 9/1  EEG - cortical dysfunction but no seizures.  LDL 88  HgbA1c 12.3  Lovenox 40 mg sq daily  for VTE prophylaxis  No antithrombotic prior to admission, now on aspirin 81 mg daily and clopidogrel 75 mg daily x 3 weeks, then aspirin alone. (Plavix started 04/21/19 - last dose 05/12/19)  Therapy recommendations:  CIR -> D/t lack of post hospitalization family support - will pursue SNF - will need rapid covid testing prior to D/C  Medically ready for d/c when bed  found  Disposition:  pending (SW trying to help family w/ placement, MA MCD and transfer back to [place])  Possible seizure  Full body rigid shaking hours post IR, L>R  Loaded with keppra  EEG - cortical dysfunction but no seizures.  Continue keppra 500mg  bid  Hypertension  No hx of HTN, on no home meds  Placed On cleviprex gtt for BP control post IR, now off . continue amlodipine 10 . increase lotensin to 10mg  bid  . BP on the higher end . BP goal normotensive  Hyperlipidemia  Home meds:  No statin  LDL 88, goal < 70  HDL 39  On lipitor 20  Continue statin at discharge  Diabetes type II, new diagnosis, Uncontrolled Hyperglycemia, improving  HgbA1c 12.3, goal < 7.0  CBGs  continue metformin 1000mg  bid (off glipizde to 5 mg bid d/t hypoglycemia)  DM coordinator consulted - should glucoses remain elevated.  May benefit from SGLT-2inhibitior if he gets insurance   SSI - on 0-9 scale  Glucoses stabilized   Likely hypercoagulable state associated with uncontrolled DM  Dysphagia . Secondary to stroke . Speech on board . On D2 nectar thick . Encourage po intake . Continue IVF @ 40 -  d/c on discharge  UTI with fever, resolved  UA showed WBC > 50  Urine culture enterobactor and staph coag neg  Sensitive to cipro  Finished cipro 500mg  bid course (ends 9/8)  Fever resolved   Other Stroke Risk Factors   hx of stroke - small remote appearing infarcts in the left frontal lobe by CT and MRI  Other Active Problems  Microcytic anemia - iron 38, Ferritin 115 - put on iron tab bid.  Pt found on floor 04/23/19 by nursing staff. No obvious injury, complaints of pain, or neurologic changes.   Hospital day # 25  Marvel PlanJindong Daly Whipkey, MD PhD Stroke Neurology 05/09/2019 5:40 PM   To contact Stroke Continuity provider, please refer to [email or web address]. After hours, contact General Neurology

## 2019-05-10 LAB — GLUCOSE, CAPILLARY
Glucose-Capillary: 149 mg/dL — ABNORMAL HIGH (ref 70–99)
Glucose-Capillary: 210 mg/dL — ABNORMAL HIGH (ref 70–99)
Glucose-Capillary: 157 mg/dL — ABNORMAL HIGH (ref 70–99)
Glucose-Capillary: 217 mg/dL — ABNORMAL HIGH (ref 70–99)
Glucose-Capillary: 167 mg/dL — ABNORMAL HIGH (ref 70–99)

## 2019-05-10 NOTE — Progress Notes (Signed)
Nutrition Follow-up  DOCUMENTATION CODES:    Not applicable  INTERVENTION:  Continue Glucerna Shake po TID, each supplement provides 220 kcal and 10 grams of protein  Encourage adequate PO intake.  NUTRITION DIAGNOSIS:   Inadequate oral intake related to inability to eat as evidenced by NPO status; diet advanced; improved  GOAL:   Patient will meet greater than or equal to 90% of their needs; progressing  MONITOR:   PO intake, Supplement acceptance, Diet advancement, Skin, Weight trends, Labs, I & O's  REASON FOR ASSESSMENT:   Ventilator    ASSESSMENT:   [name] with no significant past medical history presenting with right-sided weakness, decreased sensation on the right, diplopia and headache. Complete revascularization of occluded L PCA w/ mechanical thrombectomy. Self extubated 8/28.TEE9/1which showed no PFO/clot or cardiac source of aneurysm.  Pt continues on a dysphagia 2 diet with nectar thick liquids. Meal completion has been 70-100%. Pt currently has Glucerna shake ordered and has been consuming most of them. RD to continue with current orders to aid in adequate nutrition. SNF placement pending.    Labs and medications reviewed.   Diet Order:   Diet Order            DIET DYS 2 Room service appropriate? No; Fluid consistency: Nectar Thick  Diet effective now              EDUCATION NEEDS:   Not appropriate for education at this time  Skin:  Skin Assessment: Reviewed RN Assessment Skin Integrity Issues:: Other (Comment) Other: N/A  Last BM:  9/21  Height:   Ht Readings from Last 1 Encounters:  04/19/19 6' (1.829 m)    Weight:   Wt Readings from Last 1 Encounters:  04/19/19 77 kg    Ideal Body Weight:  80.9 kg  BMI:  Body mass index is 23.02 kg/m.  Estimated Nutritional Needs:   Kcal:  2000-2200  Protein:  100-115 grams  Fluid:  > 2.0 L    Rodney Parker, MS, RD, LDN Pager # [phone] After  hours/ weekend pager # [phone]

## 2019-05-10 NOTE — Progress Notes (Signed)
  Speech Language Pathology Treatment: Cognitive-Linquistic  Patient Details Name: Rodney Collier MRN: [id number] DOB: 12-16-1965 Today's Date: 05/10/2019 Time: 0950-1010 SLP Time Calculation (min) (ACUTE ONLY): 20 min  Assessment / Plan / Recommendation Clinical Impression  Pt seen at bedside for continued cognitive-communication treatment.  Pt continues to require encouragement to utilize voicing. He is usually aphonic without cues. Pt had difficulty following one-step commands due to significant pain. He was able to produce automatic sequences with moderate assist by SLP. He was noted to switch between [place] and [place] when counting. Pt unintelligible for confrontational naming tasks, however, uncertain if pt was speaking [place] or [place] at the time.    Pt was in obvious and increasingly significant discomfort, which resulted in early cessation of session, due to pt being highly distracted by pain. Pt was repositioned to a more reclined setting to maximize comfort, and RN was informed.   HPI HPI: Pt is a 53 y.o. M with no known PMH who presents with right sided weakness, decreased sensation on right, diplopia and headache. CT showing remote L frontal infarcts, CTA head/neck with L P2 occlusion, MRI with acute L PCA infarct including much of L thalamus, L > R cerebellum, R pons. Now s/p complete revascularization of occluded L PCA with mechanical thrombectomy. ETT 8/27-8/28; self extubated 8/28.      SLP Plan  Continue with current plan of care             Oral Care Recommendations: Oral care BID Follow up Recommendations: 24 hour supervision/assistance SLP Visit Diagnosis: Aphasia (R47.01);Apraxia (R48.2) Plan: Continue with current plan of care       [place]. Quentin Ore [facility], CCC-SLP Speech Language Pathologist [phone]  [name] 05/10/2019, 10:27 AM

## 2019-05-10 NOTE — Progress Notes (Signed)
STROKE TEAM PROGRESS NOTE   INTERVAL HISTORY Pt lying in bed. Off IVF. No neuro changes. Pending SNF  Vitals:   05/09/19 2341 05/10/19 0337 05/10/19 0836 05/10/19 1112  BP: 128/82 128/86 (!) 121/100 (!) 115/91  Pulse: 92 83 90 95  Resp: 17 17 17 17   Temp: 98.5 F (36.9 C) 98.9 F (37.2 C) 98.6 F (37 C) 98.7 F (37.1 C)  TempSrc: Oral Oral Oral Oral  SpO2: 98% 97% 99% 100%  Weight:      Height:        CBC:  Recent Labs  Lab 05/07/19 1959 05/08/19 0357  WBC 6.6 5.9  HGB 12.4* 12.2*  HCT 39.0 38.7*  MCV 73.2* 73.0*  PLT 430* 423*    Basic Metabolic Panel:  Recent Labs  Lab 05/07/19 1959 05/08/19 0357  NA 138 136  K 3.9 3.8  CL 105 100  CO2 22 23  GLUCOSE 103* 126*  BUN 9 9  CREATININE 0.69 0.71  CALCIUM 9.4 9.4    IMAGING CT Head WO Contrast 04/16/2019 Areas of acute infarction are showing low-density and swelling but no macroscopic hemorrhage by CT. As shown by previous MRI, there is involvement of the cerebellum, left cerebral peduncle and thalamus and left posteromedial temporal lobe and occipital lobe. Tiny focus of infarction in the right occipital lobe as well.  [name] 04/15/2019 1. Acute infarction throughout the left PCA territory (including most of the left thalamus), left more than right cerebellum, and right pons. Petechial hemorrhage is seen at the level of the pons.  2. Prior left frontal infarcts and microvascular ischemic gliosis.   Cerebral angiogram 04/14/2019 4 vessel cerebral arteriogram followed by complete revascularization of occluded Lt PCA  With x 2 passes with 4mm x 60mm embotrap retriever  device achieving a TICI 3 revascularization.  Ct Code Stroke Cta Head W/wo Contrast Ct Code Stroke Cta Neck W/wo Contrast Ct Code Stroke Cta Cerebral Perfusion W/wo Contrast 04/14/2019 1. Left P2 occlusion with downstream reconstitution. Qualitative assessment of CT perfusion maps shows ischemia without visible core infarct in the  left occipital lobe.  2. Mild atherosclerosis in the neck without flow limiting stenosis or embolic source seen.  3. No large vessel occlusion in the anterior circulation.  4. Mild mid basilar narrowing.   Ct Head Code Stroke Wo Contrast 04/14/2019 1. No acute finding.  2. Small remote appearing infarcts in the left frontal lobe.    LE Doppler 04/15/2019 Right: There is no evidence of deep vein thrombosis in the lower extremity. No cystic structure found in the popliteal fossa. Left: There is no evidence of deep vein thrombosis in the lower extremity. No cystic structure found in the popliteal fossa.  2D Echocardiogram 04/15/2019  1. The left ventricle has normal systolic function, with an ejection fraction of 55-60%. The cavity size was normal. Left ventricular diastolic parameters were normal. No evidence of left ventricular regional wall motion abnormalities.  2. The right ventricle has normal systolic function. The cavity was normal. There is no increase in right ventricular wall thickness.  3. The mitral valve is abnormal. Mild thickening of the mitral valve leaflet. The MR jet is posteriorly-directed.  4. The tricuspid valve is grossly normal.  5. The aortic valve is tricuspid. No stenosis of the aortic valve.  6. The aorta is normal unless otherwise noted.  7. The inferior vena cava was normal in size with <50% respiratory variability. SUMMARY LVEF 55-60%, normal wall thickness, normal wall motion,  normal diastolic function, mild posteriorly directed MR, normal size IVC that does not collapse, no PFO by color doppler  FINDINGS  Left Ventricle: The left ventricle has normal systolic function, with an ejection fraction of 55-60%. The cavity size was normal. There is no increase in left ventricular wall thickness. Left ventricular diastolic parameters were normal. Normal left ventricular filling pressures No evidence of left ventricular regional wall motion abnormalities..  EEG   04/15/2019 This study is suggestive of cortical dysfunction in the left hemisphere secondary to underlying stroke as well as mild diffuse encephalopathy. No seizures or definite epileptiform discharges were seen throughout the recording.  TCD w/ bubble 04/18/2019  No HTIS heard during rest. No HITS heard during valsalva.  TEE 04/19/2019 LVEF 55-60% No LA/LAA thrombus or mass No ASD or PFO by color flow Doppler or saline microcavitation study. Mild AR, mild MR.   PHYSICAL EXAM   General - Well nourished, well developed, African gentleman who is not in distress  Ophthalmologic - fundi not visualized due to noncooperation.  Cardiovascular - Regular rate and rhythm.  Neuro - awake, alert, eyes open, following most of the simple commands. Anarthria.  Dysconjugate eyes, left eye CN III palsy with pupil sparing, right eye CN VI palsy, PERRL. Not blinking to visual threat on the right. Right facial droop, tongue protrusion not cooperative, against gravity of LUE but significant ataxia, able to follow simple commands with left hand. lifts left leg slightly off of the bed on command, follow simple commands of the left foot. On pain stimulation, slight withdraw of RLE but no movement of RUE. Increased muscle tone on the RUE. Positive triple reflex on the right. Sensation and gait not tested.   ASSESSMENT/PLAN Mr. Jamell Laymon is a 53 y.o. male with no significant past medical history presenting with right-sided weakness, decreased sensation on the right, diplopia and headache. Complete revascularization of occluded L PCA w/ mechanical thrombectomy.  Stroke: embolic multifocal posterior circulation infarcts with left P2 occlusion s/p IR with TICI3 reperfusion - likely embolic   Code Stroke CT head No acute abnormality. Old infarct L frontal lobe.   CTA head & neck L P2 occlusion. Mild neck atherosclerosis . No LVO anterior circulation. Mild mid BA narrowing.  CT perfusion ischemia w/o visible  core  CT Head - 04/16/19 - Areas of acute infarction are showing low-density and swelling but no macroscopic hemorrhage by CT.    Cerebral angio TICI3 reperfusion of occluded L PCA  MRI  L PCA (including thalamus), L>R cerebellum, R pontine and left midbrain infarct. Petechial hemorrhage seen in pons. Old L frontal infarcts.   LE Doppler no DVT - no evidence of deep vein thrombosis   2D Echo - EF 55-60%.  No cardiac source of emboli identified.   TCD bubble no HITS  TEE neg  UDS - pos for benzos - UDS done on 9/1  EEG - cortical dysfunction but no seizures.  LDL 88  HgbA1c 12.3  Lovenox 40 mg sq daily  for VTE prophylaxis  No antithrombotic prior to admission, now on aspirin 81 mg daily and clopidogrel 75 mg daily x 3 weeks, then aspirin alone. (Plavix started 04/21/19 - last dose 05/12/19)  Therapy recommendations:  CIR -> D/t lack of post hospitalization family support - will pursue SNF - will need rapid covid testing prior to D/C  Medically ready for d/c when bed found  Disposition:  pending (SW trying to help family w/ placement, MA MCD and transfer back to  [place])  Patient remains stable  Possible seizure  Full body rigid shaking hours post IR, L>R  Loaded with keppra  EEG - cortical dysfunction but no seizures.  Continue keppra 500mg  bid  Hypertension  No hx of HTN, on no home meds  Placed On cleviprex gtt for BP control post IR, now off . continue amlodipine 10 . increase lotensin to 10mg  bid  . BP on the higher end . BP goal normotensive  Hyperlipidemia  Home meds:  No statin  LDL 88, goal < 70  HDL 39  On lipitor 20  Continue statin at discharge  Diabetes type II, new diagnosis, Uncontrolled Hyperglycemia, improving  HgbA1c 12.3, goal < 7.0  CBGs  continue metformin 1000mg  bid (off glipizde to 5 mg bid d/t hypoglycemia)  DM coordinator consulted - should glucoses remain elevated. May benefit from SGLT-2inhibitior if he gets insurance    SSI - on 0-9 scale  Glucoses stabilized   Likely hypercoagulable state associated with uncontrolled DM  Dysphagia . Secondary to stroke . Speech on board . On D2 nectar thick . Encourage po intake . Off IVF  UTI with fever, resolved  UA showed WBC > 50  Urine culture enterobactor and staph coag neg  Sensitive to cipro  Finished cipro 500mg  bid course (ends 9/8)  Fever resolved   Other Stroke Risk Factors   hx of stroke - small remote appearing infarcts in the left frontal lobe by CT and MRI  Other Active Problems  Microcytic anemia - iron 38, Ferritin 115 - put on iron tab bid.  Pt found on floor 04/23/19 by nursing staff. No obvious injury, complaints of pain, or neurologic changes.   Hospital day # 37  Rosalin Hawking, MD PhD Stroke Neurology 05/10/2019 2:47 PM   To contact Stroke Continuity provider, please refer to [email or web address] After hours, contact General Neurology

## 2019-05-10 NOTE — Plan of Care (Signed)
Patient stable, patient unable to participate in POC d/t mentation.

## 2019-05-11 DIAGNOSIS — Z4659 Encounter for fitting and adjustment of other gastrointestinal appliance and device: Secondary | ICD-10-CM

## 2019-05-11 DIAGNOSIS — I63113 Cerebral infarction due to embolism of bilateral vertebral arteries: Secondary | ICD-10-CM

## 2019-05-11 LAB — CBC
HCT: 44 % (ref 39.0–52.0)
Hemoglobin: 14.2 g/dL (ref 13.0–17.0)
MCH: 23.4 pg — ABNORMAL LOW (ref 26.0–34.0)
MCHC: 32.3 g/dL (ref 30.0–36.0)
MCV: 72.4 fL — ABNORMAL LOW (ref 80.0–100.0)
Platelets: 394 10*3/uL (ref 150–400)
RBC: 6.08 MIL/uL — ABNORMAL HIGH (ref 4.22–5.81)
RDW: 14.2 % (ref 11.5–15.5)
WBC: 7 10*3/uL (ref 4.0–10.5)
nRBC: 0 % (ref 0.0–0.2)

## 2019-05-11 LAB — GLUCOSE, CAPILLARY
Glucose-Capillary: 183 mg/dL — ABNORMAL HIGH (ref 70–99)
Glucose-Capillary: 171 mg/dL — ABNORMAL HIGH (ref 70–99)
Glucose-Capillary: 183 mg/dL — ABNORMAL HIGH (ref 70–99)
Glucose-Capillary: 191 mg/dL — ABNORMAL HIGH (ref 70–99)

## 2019-05-11 LAB — BASIC METABOLIC PANEL
Anion gap: 15 (ref 5–15)
BUN: 12 mg/dL (ref 6–20)
CO2: 21 mmol/L — ABNORMAL LOW (ref 22–32)
Calcium: 10.1 mg/dL (ref 8.9–10.3)
Chloride: 103 mmol/L (ref 98–111)
Creatinine, Ser: 0.94 mg/dL (ref 0.61–1.24)
GFR calc Af Amer: >60 mL/min (ref >60)
GFR calc non Af Amer: >60 mL/min (ref >60)
Glucose, Bld: 163 mg/dL — ABNORMAL HIGH (ref 70–99)
Potassium: 4.3 mmol/L (ref 3.5–5.1)
Sodium: 139 mmol/L (ref 135–145)

## 2019-05-11 NOTE — Progress Notes (Signed)
Occupational Therapy Treatment Patient Details Name: Rodney Collier MRN: [id number] DOB: March 29, 1966 Today's Date: 05/11/2019    History of present illness Pt is a 53 y.o. M with no known PMH who presents with right sided weakness, decreased sensation on right, diplopia and headache. CT showing remote L frontal infarcts, CTA head/neck with L P2 occlusion, MRI with acute L PCA infarct including much of L thalamus, L > R cerebellum, R pons. Now s/p complete revascularization of occluded L PCA with mechanical thrombectomy. ETT 8/27-8/28; self extubated 8/28.   OT comments  Pt required chair follow for mobility modA to maxA for stability and R sided lean. Pt modA +2 for sit to stand. MaxA+2 for mobility with R knee blocked and modA for sliding RLE forward for mobility. Pt modA to maxA overall for ADL. Pt would benefit from continued OT skilled services for ADL,mobility and safety in SNF setting. OT following acutely.    Follow Up Recommendations  SNF    Equipment Recommendations  Other (comment)(to be determined)    Recommendations for Other Services      Precautions / Restrictions Precautions Precautions: Fall Precaution Comments: R hemiparesis and inattention Restrictions Weight Bearing Restrictions: No       Mobility Bed Mobility Overal bed mobility: Needs Assistance Bed Mobility: Supine to Sit;Sit to Supine     Supine to sit: Mod assist;+2 for safety/equipment Sit to supine: Mod assist;+2 for physical assistance   General bed mobility comments: Pt utilizing railing with LUE and attempting to scoot around. Not able to fully roll and required assist to get to sidelying. HHA to pull to sitting required. Pt was able to take 2 scoots forward with L hip and get himself adjusted at EOB.   Transfers Overall transfer level: Needs assistance Equipment used: 2 person hand held assist Transfers: Sit to/from [facility] Sit to Stand: Mod assist;+2 physical  assistance Stand pivot transfers: Mod assist;+2 physical assistance       General transfer comment: +2 assist to power-up to full stand and pivot around from chair to bed at end of session.     Balance Overall balance assessment: Needs assistance Sitting-balance support: Single extremity supported;Feet supported Sitting balance-Leahy Scale: Fair Sitting balance - Comments: pt able to maintain static balance with close minguard assist, mostly reliant on LUE support. sitting balance improved with time. Postural control: Right lateral lean;Posterior lean Standing balance support: Single extremity supported Standing balance-Leahy Scale: Poor Standing balance comment: able to maintain static balance with LUE support and very close minguard assist, once fatigued pt requiring increased assist (up to modA), R lateral lean with standing requiring cues to correct                           ADL either performed or assessed with clinical judgement   ADL Overall ADL's : Needs assistance/impaired Eating/Feeding: Set up;Sitting   Grooming: Wash/dry face;Wash/dry hands;Sitting;Set up                               Functional mobility during ADLs: Moderate assistance;Maximal assistance;+2 for physical assistance;+2 for safety/equipment General ADL Comments: required chair follow for mobility modA to maxA for stability and R sided lean     Vision   Vision Assessment?: Vision impaired- to be further tested in functional context Additional Comments: Pt unable to follow commands consistently for visual scanning. Pt with increased blinking   Perception  Praxis      Cognition Arousal/Alertness: Awake/alert Behavior During Therapy: Flat affect Overall Cognitive Status: Difficult to assess Area of Impairment: Attention;Safety/judgement;Problem solving                 Orientation Level: Place;Time;Situation;Disoriented to Current Attention Level: Sustained Memory:  Decreased recall of precautions Following Commands: Follows one step commands with increased time;Follows multi-step commands inconsistently Safety/Judgement: Decreased awareness of safety;Decreased awareness of deficits Awareness: Emergent Problem Solving: Difficulty sequencing;Requires verbal cues;Requires tactile cues General Comments: Pt following most commands; flat affect and mouthing words.        Exercises General Exercises - Upper Extremity Elbow Flexion: PROM;5 reps;Right;Supine Elbow Extension: PROM;5 reps;Supine Hand Exercises Composite Extension: Right;PROM;10 reps   Shoulder Instructions       General Comments      Pertinent Vitals/ Pain       Pain Assessment: Faces Faces Pain Scale: Hurts even more Pain Location: general grimacing with return to bed. When attempting to figure out source of pain and questioning, pt mouths "I'm fine." Pain Descriptors / Indicators: Grimacing Pain Intervention(s): Limited activity within patient's tolerance  Home Living                                          Prior Functioning/Environment              Frequency  Min 2X/week        Progress Toward Goals  OT Goals(current goals can now be found in the care plan section)  Progress towards OT goals: Progressing toward goals  Acute Rehab OT Goals Patient Stated Goal: unable OT Goal Formulation: Patient unable to participate in goal setting Time For Goal Achievement: 05/19/19 Potential to Achieve Goals: Good ADL Goals Pt Will Perform Grooming: with min assist;sitting Pt Will Transfer to Toilet: with max assist;bedside commode;stand pivot transfer Additional ADL Goal #1: pt will complete bed mobility mod (A) as precursor to adls Additional ADL Goal #2: pt will complete 2 step command  Plan Discharge plan needs to be updated    Co-evaluation      Reason for Co-Treatment: Complexity of the patient's impairments (multi-system involvement) PT goals  addressed during session: Mobility/safety with mobility;Balance OT goals addressed during session: ADL's and self-care      AM-PAC OT "6 Clicks" Daily Activity     Outcome Measure   Help from another person eating meals?: A Lot Help from another person taking care of personal grooming?: A Lot Help from another person toileting, which includes using toliet, bedpan, or urinal?: A Lot Help from another person bathing (including washing, rinsing, drying)?: A Lot Help from another person to put on and taking off regular upper body clothing?: A Lot Help from another person to put on and taking off regular lower body clothing?: Total 6 Click Score: 11    End of Session Equipment Utilized During Treatment: Gait belt  OT Visit Diagnosis: Unsteadiness on feet (R26.81);Hemiplegia and hemiparesis Hemiplegia - Right/Left: Right Hemiplegia - dominant/non-dominant: Dominant Hemiplegia - caused by: Cerebral infarction   Activity Tolerance Patient tolerated treatment well   Patient Left in chair;with call bell/phone within reach;with chair alarm set   Nurse Communication Mobility status        Time: 9678-9381 OT Time Calculation (min): 34 min  Charges: OT General Charges $OT Visit: 1 Visit OT Treatments $Self Care/Home Management : 8-22 mins $Neuromuscular Re-education: 8-22  mins     Cristi Loron) Glendell Docker OTR/L Acute Rehabilitation Services Pager: [phone] Office: [phone]    Lonzo Cloud 05/11/2019, 5:00 PM

## 2019-05-11 NOTE — Progress Notes (Signed)
Physical Therapy Treatment Patient Details Name: Rodney Collier MRN: [id number] DOB: 1966-08-14 Today's Date: 05/11/2019    History of Present Illness Pt is a 53 y.o. M with no known PMH who presents with right sided weakness, decreased sensation on right, diplopia and headache. CT showing remote L frontal infarcts, CTA head/neck with L P2 occlusion, MRI with acute L PCA infarct including much of L thalamus, L > R cerebellum, R pons. Now s/p complete revascularization of occluded L PCA with mechanical thrombectomy. ETT 8/27-8/28; self extubated 8/28.    PT Comments    Pt progressing towards physical therapy goals. Was able to perform transfers with +2 mod assist, and ~15 feet of ambulation with +2 max assist and chair follow in hallway. Pt appeared fatigued after gait training, however was not indicating he was tired when asked. Overall pt continues to appear flat and apathetic but will try most anything that therapists have asked of him thus far. Will continue to follow.     Follow Up Recommendations  CIR;Supervision/Assistance - 24 hour     Equipment Recommendations  Wheelchair (measurements PT);Wheelchair cushion (measurements PT);3in1 (PT);Hospital bed    Recommendations for Other Services Rehab consult     Precautions / Restrictions Precautions Precautions: Fall Precaution Comments: R hemiparesis and inattention Restrictions Weight Bearing Restrictions: No    Mobility  Bed Mobility Overal bed mobility: Needs Assistance Bed Mobility: Supine to Sit;Sit to Supine     Supine to sit: Mod assist;+2 for safety/equipment Sit to supine: Mod assist;+2 for physical assistance   General bed mobility comments: Pt utilizing railing with LUE and attempting to scoot around. Not able to fully roll and required assist to get to sidelying. HHA to pull to sitting required. Pt was able to take 2 scoots forward with L hip and get himself adjusted at EOB.   Transfers Overall transfer level:  Needs assistance Equipment used: 2 person hand held assist Transfers: Sit to/from [facility] Sit to Stand: Mod assist;+2 physical assistance Stand pivot transfers: Mod assist;+2 physical assistance       General transfer comment: +2 assist to power-up to full stand and pivot around from chair to bed at end of session.   Ambulation/Gait Ambulation/Gait assistance: Max assist;+2 physical assistance Gait Distance (Feet): 15 Feet(5', seated rest, 10') Assistive device: 2 person hand held assist Gait Pattern/deviations: Step-to pattern;Decreased stride length;Decreased weight shift to right;Decreased weight shift to left;Trunk flexed Gait velocity: Decreased Gait velocity interpretation: <1.31 ft/sec, indicative of household ambulator General Gait Details: "Three musketeer" style with pt's L UE over therapist's shoulders, and OT supporting pt on R side with knee block and UE support to protect subluxed shoulder. Pt was able to ambulate with close chair follow by RN with assist for RLE advancement, and therapists facilitating weight shift.    Stairs             Wheelchair Mobility    Modified Rankin (Stroke Patients Only) Modified Rankin (Stroke Patients Only) Pre-Morbid Rankin Score: No symptoms Modified Rankin: Moderately severe disability     Balance Overall balance assessment: Needs assistance Sitting-balance support: Single extremity supported;Feet supported Sitting balance-Leahy Scale: Fair Sitting balance - Comments: pt able to maintain static balance with close minguard assist, mostly reliant on LUE support. sitting balance improved with time. Postural control: Right lateral lean;Posterior lean Standing balance support: Single extremity supported Standing balance-Leahy Scale: Poor Standing balance comment: able to maintain static balance with LUE support and very close minguard assist, once fatigued pt requiring increased  assist (up to modA), R lateral  lean with standing requiring cues to correct                            Cognition Arousal/Alertness: Awake/alert Behavior During Therapy: Flat affect Overall Cognitive Status: Difficult to assess Area of Impairment: Attention;Safety/judgement;Problem solving                 Orientation Level: Place;Time;Situation;Disoriented to Current Attention Level: Sustained Memory: Decreased recall of precautions Following Commands: Follows one step commands with increased time;Follows multi-step commands inconsistently Safety/Judgement: Decreased awareness of safety;Decreased awareness of deficits Awareness: Emergent Problem Solving: Difficulty sequencing;Requires verbal cues;Requires tactile cues        Exercises      General Comments        Pertinent Vitals/Pain Pain Assessment: Faces Faces Pain Scale: Hurts even more Pain Location: general grimacing with return to bed. When attempting to figure out source of pain and questioning, pt mouths "I'm fine." Pain Descriptors / Indicators: Grimacing Pain Intervention(s): Limited activity within patient's tolerance;Monitored during session;Repositioned    Home Living                      Prior Function            PT Goals (current goals can now be found in the care plan section) Acute Rehab PT Goals Patient Stated Goal: unable PT Goal Formulation: Patient unable to participate in goal setting Time For Goal Achievement: 05/16/19 Potential to Achieve Goals: Good Progress towards PT goals: Progressing toward goals    Frequency    Min 3X/week      PT Plan Discharge plan needs to be updated    Co-evaluation PT/OT/SLP Co-Evaluation/Treatment: Yes Reason for Co-Treatment: Complexity of the patient's impairments (multi-system involvement);Necessary to address cognition/behavior during functional activity;For patient/therapist safety;To address functional/ADL transfers PT goals addressed during session:  Mobility/safety with mobility;Balance        AM-PAC PT "6 Clicks" Mobility   Outcome Measure  Help needed turning from your back to your side while in a flat bed without using bedrails?: A Lot Help needed moving from lying on your back to sitting on the side of a flat bed without using bedrails?: A Lot Help needed moving to and from a bed to a chair (including a wheelchair)?: A Lot Help needed standing up from a chair using your arms (e.g., wheelchair or bedside chair)?: A Lot Help needed to walk in hospital room?: Total Help needed climbing 3-5 steps with a railing? : Total 6 Click Score: 10    End of Session Equipment Utilized During Treatment: Gait belt Activity Tolerance: Patient tolerated treatment well Patient left: in chair;with chair alarm set;with call bell/phone within reach Nurse Communication: Mobility status;Need for lift equipment PT Visit Diagnosis: Unsteadiness on feet (R26.81);Muscle weakness (generalized) (M62.81);Difficulty in walking, not elsewhere classified (R26.2);Hemiplegia and hemiparesis Hemiplegia - Right/Left: Right Hemiplegia - dominant/non-dominant: Non-dominant Hemiplegia - caused by: Cerebral infarction     Time: 1343-1410 PT Time Calculation (min) (ACUTE ONLY): 27 min  Charges:  $Gait Training: 8-22 mins                     Conni Slipper, PT, DPT Acute Rehabilitation Services Pager: [phone] Office: [phone]    Marylynn Pearson 05/11/2019, 3:25 PM

## 2019-05-11 NOTE — Progress Notes (Signed)
STROKE TEAM PROGRESS NOTE   INTERVAL HISTORY Pt lying in bed. NT stated that he eats well with assistance but not eating much if no assistance. IVF has been discontinued. Labs so far WNL. I talked with her daughter over the phone, she is still working on his placement issue.   Vitals:   05/11/19 0040 05/11/19 0434 05/11/19 0908 05/11/19 1148  BP: (!) 142/85 131/87 123/82 (P) 117/87  Pulse: 79 80 86 (P) 86  Resp: 18 17 18  (P) 18  Temp: 98.7 F (37.1 C) 98 F (36.7 C) 98.2 F (36.8 C) (P) 98.7 F (37.1 C)  TempSrc: Axillary Axillary Oral (P) Oral  SpO2: 97% 98% 97% (P) 98%  Weight:      Height:        CBC:  Recent Labs  Lab 05/08/19 0357 05/11/19 1400  WBC 5.9 7.0  HGB 12.2* 14.2  HCT 38.7* 44.0  MCV 73.0* 72.4*  PLT 423* 035    Basic Metabolic Panel:  Recent Labs  Lab 05/08/19 0357 05/11/19 1400  NA 136 139  K 3.8 4.3  CL 100 103  CO2 23 21*  GLUCOSE 126* 163*  BUN 9 12  CREATININE 0.71 0.94  CALCIUM 9.4 10.1    IMAGING CT Head WO Contrast 04/16/2019 Areas of acute infarction are showing low-density and swelling but no macroscopic hemorrhage by CT. As shown by previous MRI, there is involvement of the cerebellum, left cerebral peduncle and thalamus and left posteromedial temporal lobe and occipital lobe. Tiny focus of infarction in the right occipital lobe as well.  [name] 04/15/2019 1. Acute infarction throughout the left PCA territory (including most of the left thalamus), left more than right cerebellum, and right pons. Petechial hemorrhage is seen at the level of the pons.  2. Prior left frontal infarcts and microvascular ischemic gliosis.   Cerebral angiogram 04/14/2019 4 vessel cerebral arteriogram followed by complete revascularization of occluded Lt PCA  With x 2 passes with 48mm x 28mm embotrap retriever  device achieving a TICI 3 revascularization.  Ct Code Stroke Cta Head W/wo Contrast Ct Code Stroke Cta Neck W/wo Contrast Ct Code  Stroke Cta Cerebral Perfusion W/wo Contrast 04/14/2019 1. Left P2 occlusion with downstream reconstitution. Qualitative assessment of CT perfusion maps shows ischemia without visible core infarct in the left occipital lobe.  2. Mild atherosclerosis in the neck without flow limiting stenosis or embolic source seen.  3. No large vessel occlusion in the anterior circulation.  4. Mild mid basilar narrowing.   Ct Head Code Stroke Wo Contrast 04/14/2019 1. No acute finding.  2. Small remote appearing infarcts in the left frontal lobe.    LE Doppler 04/15/2019 Right: There is no evidence of deep vein thrombosis in the lower extremity. No cystic structure found in the popliteal fossa. Left: There is no evidence of deep vein thrombosis in the lower extremity. No cystic structure found in the popliteal fossa.  2D Echocardiogram 04/15/2019  1. The left ventricle has normal systolic function, with an ejection fraction of 55-60%. The cavity size was normal. Left ventricular diastolic parameters were normal. No evidence of left ventricular regional wall motion abnormalities.  2. The right ventricle has normal systolic function. The cavity was normal. There is no increase in right ventricular wall thickness.  3. The mitral valve is abnormal. Mild thickening of the mitral valve leaflet. The MR jet is posteriorly-directed.  4. The tricuspid valve is grossly normal.  5. The aortic valve is tricuspid.  No stenosis of the aortic valve.  6. The aorta is normal unless otherwise noted.  7. The inferior vena cava was normal in size with <50% respiratory variability. SUMMARY LVEF 55-60%, normal wall thickness, normal wall motion, normal diastolic function, mild posteriorly directed MR, normal size IVC that does not collapse, no PFO by color doppler  FINDINGS  Left Ventricle: The left ventricle has normal systolic function, with an ejection fraction of 55-60%. The cavity size was normal. There is no increase in left  ventricular wall thickness. Left ventricular diastolic parameters were normal. Normal left ventricular filling pressures No evidence of left ventricular regional wall motion abnormalities..  EEG  04/15/2019 This study is suggestive of cortical dysfunction in the left hemisphere secondary to underlying stroke as well as mild diffuse encephalopathy. No seizures or definite epileptiform discharges were seen throughout the recording.  TCD w/ bubble 04/18/2019  No HTIS heard during rest. No HITS heard during valsalva.  TEE 04/19/2019 LVEF 55-60% No LA/LAA thrombus or mass No ASD or PFO by color flow Doppler or saline microcavitation study. Mild AR, mild MR.   PHYSICAL EXAM General - Well nourished, well developed, African gentleman who is not in distress  Ophthalmologic - fundi not visualized due to noncooperation.  Cardiovascular - Regular rate and rhythm.  Neuro - awake, alert, eyes open, following most of the simple commands. Anarthria.  Dysconjugate eyes, left eye CN III palsy with pupil sparing, right eye CN VI palsy, PERRL. Not blinking to visual threat on the right. Right facial droop, tongue protrusion not cooperative, against gravity of LUE but significant ataxia, able to follow simple commands with left hand. lifts left leg slightly off of the bed on command, follow simple commands of the left foot. On pain stimulation, slight withdraw of RLE but no movement of RUE. Increased muscle tone on the RUE. Positive triple reflex on the right. Sensation and gait not tested.   ASSESSMENT/PLAN Rodney Collier is a 53 y.o. male with no significant past medical history presenting with right-sided weakness, decreased sensation on the right, diplopia and headache. Complete revascularization of occluded L PCA w/ mechanical thrombectomy.  Stroke: embolic multifocal posterior circulation infarcts with left P2 occlusion s/p IR with TICI3 reperfusion - likely embolic   Code Stroke CT head No acute  abnormality. Old infarct L frontal lobe.   CTA head & neck L P2 occlusion. Mild neck atherosclerosis . No LVO anterior circulation. Mild mid BA narrowing.  CT perfusion ischemia w/o visible core  CT Head - 04/16/19 - Areas of acute infarction are showing low-density and swelling but no macroscopic hemorrhage by CT.    Cerebral angio TICI3 reperfusion of occluded L PCA  MRI  L PCA (including thalamus), L>R cerebellum, R pontine and left midbrain infarct. Petechial hemorrhage seen in pons. Old L frontal infarcts.   LE Doppler no DVT - no evidence of deep vein thrombosis   2D Echo - EF 55-60%.  No cardiac source of emboli identified.   TCD bubble no HITS  TEE neg  UDS - pos for benzos - UDS done on 9/1  EEG - cortical dysfunction but no seizures.  LDL 88  HgbA1c 12.3  Lovenox 40 mg sq daily  for VTE prophylaxis  No antithrombotic prior to admission, now finished aspirin 81 mg daily and clopidogrel 75 mg daily 3-week course, on aspirin alone from tomorrow.  Therapy recommendations:  CIR -> D/t lack of post hospitalization family support - will pursue SNF - will need rapid  covid testing prior to D/C - will have CIR reassess given PT recommendations  Medically ready for d/c when bed found  Disposition:  pending (SW trying to help family w/ placement, MA MCD and transfer back to [facility])  Patient remains stable  Possible seizure  Full body rigid shaking hours post IR, L>R  Loaded with keppra  EEG - cortical dysfunction but no seizures.  Continue keppra 500mg  bid  Hypertension  No hx of HTN, on no home meds  Placed On cleviprex gtt for BP control post IR, now off . continue amlodipine 10 . increase lotensin to 10mg  bid  . BP on the higher end . BP goal normotensive  Hyperlipidemia  Home meds:  No statin  LDL 88, goal < 70  HDL 39  On lipitor 20  Continue statin at discharge  Diabetes type II, new diagnosis, Uncontrolled Hyperglycemia, improving  HgbA1c  12.3, goal < 7.0  CBGs  continue metformin 1000mg  bid (off glipizde to 5 mg bid d/t hypoglycemia)  DM coordinator consulted - should glucoses remain elevated. May benefit from SGLT-2inhibitior if he gets insurance   SSI - on 0-9 scale  Glucoses stabilized   Likely hypercoagulable state associated with uncontrolled DM  Dysphagia Malnutrition  . Secondary to stroke . Speech on board . On D2 nectar thick . Encourage po intake . Off IVF . On glucerna shakes tid . Monitor labs  UTI with fever, resolved  UA showed WBC > 50  Urine culture enterobactor and staph coag neg  Sensitive to cipro  Finished cipro 500mg  bid course (ends 9/8)  Fever resolved   Other Stroke Risk Factors   hx of stroke - small remote appearing infarcts in the left frontal lobe by CT and MRI  Other Active Problems  Microcytic anemia - iron 38, Ferritin 115 - put on iron tab bid.  Pt found on floor 04/23/19 by nursing staff. No obvious injury, complaints of pain, or neurologic changes.   Hospital day # 51  I had long discussion with daughter over the phone, updated pt current condition, treatment plan and potential prognosis, and answered all the questions. She expressed understanding and appreciation. She is still working on pt legal documents such as POA, insurance, etc.    Marvel Plan, MD PhD Stroke Neurology 05/11/2019 3:02 PM   To contact Stroke Continuity provider, please refer to [email or web address]. After hours, contact General Neurology

## 2019-05-11 NOTE — Progress Notes (Signed)
RN called the daughter to see if she could help with understanding the pt. Daughter was unable to understand pt. RN also tried with the interpreter for [place] and the interpreter could not understand either. RN tried the visual aid and he stated he was hot. RN turned down the air but that didn't seem to help either. Pt started to get frustrated. Pt answered No to bathroom and pain.

## 2019-05-11 NOTE — Progress Notes (Signed)
Orthopedic Tech Progress Note Patient Details:  Rodney Collier Jan 10, 1966 [phone] OT will be working with patient on elbow splint. ORTHO does not apply elbow splints Patient ID: Rodney Collier, male   DOB: Jul 27, 1966, 53 y.o.   MRN: [id number]   Rodney Collier 05/11/2019, 5:35 PM

## 2019-05-12 LAB — BASIC METABOLIC PANEL
Anion gap: 13 (ref 5–15)
BUN: 12 mg/dL (ref 6–20)
CO2: 23 mmol/L (ref 22–32)
Calcium: 9.5 mg/dL (ref 8.9–10.3)
Chloride: 101 mmol/L (ref 98–111)
Creatinine, Ser: 0.74 mg/dL (ref 0.61–1.24)
GFR calc Af Amer: >60 mL/min (ref >60)
GFR calc non Af Amer: >60 mL/min (ref >60)
Glucose, Bld: 203 mg/dL — ABNORMAL HIGH (ref 70–99)
Potassium: 4 mmol/L (ref 3.5–5.1)
Sodium: 137 mmol/L (ref 135–145)

## 2019-05-12 LAB — CBC
HCT: 42.2 % (ref 39.0–52.0)
Hemoglobin: 13.2 g/dL (ref 13.0–17.0)
MCH: 22.8 pg — ABNORMAL LOW (ref 26.0–34.0)
MCHC: 31.3 g/dL (ref 30.0–36.0)
MCV: 72.9 fL — ABNORMAL LOW (ref 80.0–100.0)
Platelets: 330 10*3/uL (ref 150–400)
RBC: 5.79 MIL/uL (ref 4.22–5.81)
RDW: 14.2 % (ref 11.5–15.5)
WBC: 5.9 10*3/uL (ref 4.0–10.5)
nRBC: 0 % (ref 0.0–0.2)

## 2019-05-12 LAB — GLUCOSE, CAPILLARY
Glucose-Capillary: 200 mg/dL — ABNORMAL HIGH (ref 70–99)
Glucose-Capillary: 170 mg/dL — ABNORMAL HIGH (ref 70–99)
Glucose-Capillary: 153 mg/dL — ABNORMAL HIGH (ref 70–99)
Glucose-Capillary: 259 mg/dL — ABNORMAL HIGH (ref 70–99)

## 2019-05-12 MED ORDER — QUETIAPINE FUMARATE 25 MG PO TABS
25.0000 mg | ORAL_TABLET | Freq: Once | ORAL | Status: AC
  Administered 2019-05-12: 25 mg via ORAL
  Filled 2019-05-12: qty 1

## 2019-05-12 NOTE — Progress Notes (Deleted)
RN Called now and had called 3 hours ago. Pt. Restless, agitated, attempting to get out of bed. At the time of first call, he was due for his seroquel, which I recommended he get and reassess. Pt continues to be agitated and restless.  Recs: Additional 25mg PO seroquel now. Consider restraints - will avoid as it might delay already delayed placement.  -- Jacobo Moncrief, MD [facility] Pager: [phone] If 7pm to 7am, please call on call as listed on AMION.  

## 2019-05-12 NOTE — Progress Notes (Signed)
STROKE TEAM PROGRESS NOTE   INTERVAL HISTORY No neuro changes. Vital and lab stable. Pending SNF  Vitals:   05/11/19 2340 05/12/19 0303 05/12/19 0843 05/12/19 1131  BP: 117/81 107/74 134/89 (!) 151/88  Pulse: 90 83 69 84  Resp: 18 18 18 18   Temp: 98.4 F (36.9 C) 98.6 F (37 C) 97.7 F (36.5 C) 98.7 F (37.1 C)  TempSrc: Oral Oral Axillary Oral  SpO2: 100% 100% 96% 99%  Weight:      Height:        CBC:  Recent Labs  Lab 05/11/19 1400 05/12/19 0317  WBC 7.0 5.9  HGB 14.2 13.2  HCT 44.0 42.2  MCV 72.4* 72.9*  PLT 394 710    Basic Metabolic Panel:  Recent Labs  Lab 05/11/19 1400 05/12/19 0317  NA 139 137  K 4.3 4.0  CL 103 101  CO2 21* 23  GLUCOSE 163* 203*  BUN 12 12  CREATININE 0.94 0.74  CALCIUM 10.1 9.5    IMAGING CT Head WO Contrast 04/16/2019 Areas of acute infarction are showing low-density and swelling but no macroscopic hemorrhage by CT. As shown by previous MRI, there is involvement of the cerebellum, left cerebral peduncle and thalamus and left posteromedial temporal lobe and occipital lobe. Tiny focus of infarction in the right occipital lobe as well.  [name] 04/15/2019 1. Acute infarction throughout the left PCA territory (including most of the left thalamus), left more than right cerebellum, and right pons. Petechial hemorrhage is seen at the level of the pons.  2. Prior left frontal infarcts and microvascular ischemic gliosis.   Cerebral angiogram 04/14/2019 4 vessel cerebral arteriogram followed by complete revascularization of occluded Lt PCA  With x 2 passes with 83mm x 31mm embotrap retriever  device achieving a TICI 3 revascularization.  Ct Code Stroke Cta Head W/wo Contrast Ct Code Stroke Cta Neck W/wo Contrast Ct Code Stroke Cta Cerebral Perfusion W/wo Contrast 04/14/2019 1. Left P2 occlusion with downstream reconstitution. Qualitative assessment of CT perfusion maps shows ischemia without visible core infarct in the left  occipital lobe.  2. Mild atherosclerosis in the neck without flow limiting stenosis or embolic source seen.  3. No large vessel occlusion in the anterior circulation.  4. Mild mid basilar narrowing.   Ct Head Code Stroke Wo Contrast 04/14/2019 1. No acute finding.  2. Small remote appearing infarcts in the left frontal lobe.    LE Doppler 04/15/2019 Right: There is no evidence of deep vein thrombosis in the lower extremity. No cystic structure found in the popliteal fossa. Left: There is no evidence of deep vein thrombosis in the lower extremity. No cystic structure found in the popliteal fossa.  2D Echocardiogram 04/15/2019  1. The left ventricle has normal systolic function, with an ejection fraction of 55-60%. The cavity size was normal. Left ventricular diastolic parameters were normal. No evidence of left ventricular regional wall motion abnormalities.  2. The right ventricle has normal systolic function. The cavity was normal. There is no increase in right ventricular wall thickness.  3. The mitral valve is abnormal. Mild thickening of the mitral valve leaflet. The MR jet is posteriorly-directed.  4. The tricuspid valve is grossly normal.  5. The aortic valve is tricuspid. No stenosis of the aortic valve.  6. The aorta is normal unless otherwise noted.  7. The inferior vena cava was normal in size with <50% respiratory variability. SUMMARY LVEF 55-60%, normal wall thickness, normal wall motion, normal diastolic function,  mild posteriorly directed MR, normal size IVC that does not collapse, no PFO by color doppler  FINDINGS  Left Ventricle: The left ventricle has normal systolic function, with an ejection fraction of 55-60%. The cavity size was normal. There is no increase in left ventricular wall thickness. Left ventricular diastolic parameters were normal. Normal left ventricular filling pressures No evidence of left ventricular regional wall motion abnormalities..  EEG   04/15/2019 This study is suggestive of cortical dysfunction in the left hemisphere secondary to underlying stroke as well as mild diffuse encephalopathy. No seizures or definite epileptiform discharges were seen throughout the recording.  TCD w/ bubble 04/18/2019  No HTIS heard during rest. No HITS heard during valsalva.  TEE 04/19/2019 LVEF 55-60% No LA/LAA thrombus or mass No ASD or PFO by color flow Doppler or saline microcavitation study. Mild AR, mild MR.   PHYSICAL EXAM General - Well nourished, well developed, African gentleman who is not in distress  Ophthalmologic - fundi not visualized due to noncooperation.  Cardiovascular - Regular rate and rhythm.  Neuro - awake, alert, eyes open, following most of the simple commands. Anarthria.  Dysconjugate eyes, left eye CN III palsy with pupil sparing, right eye CN VI palsy, PERRL. Not blinking to visual threat on the right. Right facial droop, tongue protrusion not cooperative, against gravity of LUE but significant ataxia, able to follow simple commands with left hand. lifts left leg slightly off of the bed on command, follow simple commands of the left foot. On pain stimulation, slight withdraw of RLE but no movement of RUE. Increased muscle tone on the RUE. Positive triple reflex on the right. Sensation and gait not tested.   ASSESSMENT/PLAN Mr. Rodney SinclairSamuel Collier is a 53 y.o. male with no significant past medical history presenting with right-sided weakness, decreased sensation on the right, diplopia and headache. Complete revascularization of occluded L PCA w/ mechanical thrombectomy.  Stroke: embolic multifocal posterior circulation infarcts with left P2 occlusion s/p IR with TICI3 reperfusion - likely embolic   Code Stroke CT head No acute abnormality. Old infarct L frontal lobe.   CTA head & neck L P2 occlusion. Mild neck atherosclerosis . No LVO anterior circulation. Mild mid BA narrowing.  CT perfusion ischemia w/o visible  core  CT Head - 04/16/19 - Areas of acute infarction are showing low-density and swelling but no macroscopic hemorrhage by CT.    Cerebral angio TICI3 reperfusion of occluded L PCA  MRI  L PCA (including thalamus), L>R cerebellum, R pontine and left midbrain infarct. Petechial hemorrhage seen in pons. Old L frontal infarcts.   LE Doppler no DVT - no evidence of deep vein thrombosis   2D Echo - EF 55-60%.  No cardiac source of emboli identified.   TCD bubble no HITS  TEE neg  UDS - pos for benzos - UDS done on 9/1  EEG - cortical dysfunction but no seizures.  LDL 88  HgbA1c 12.3  Lovenox 40 mg sq daily  for VTE prophylaxis  No antithrombotic prior to admission, now finished aspirin 81 mg daily and clopidogrel 75 mg daily 3-week course, on aspirin alone  Therapy recommendations:  CIR -> D/t lack of post hospitalization family support - will pursue SNF - will need rapid covid testing prior to D/C  Medically ready for d/c when bed found  Disposition:  pending (SW trying to help family w/ placement, [place] MCD and [place] back [place])  Patient remains stable  Possible seizure  Full body rigid shaking  hours post IR, L>R  Loaded with keppra  EEG - cortical dysfunction but no seizures.  Continue keppra 500mg  bid  Hypertension  No hx of HTN, on no home meds  Placed On cleviprex gtt for BP control post IR, now off . On amlodipine 10 . Now on lotensin to 10mg  bid  . BP100-150s . BP goal normotensive  Hyperlipidemia  Home meds:  No statin  LDL 88, goal < 70  HDL 39  On lipitor 20  Continue statin at discharge  Diabetes type II, new diagnosis, Uncontrolled Hyperglycemia, improving  HgbA1c 12.3, goal < 7.0  CBGs  continue metformin 1000mg  bid (off glipizde to 5 mg bid d/t hypoglycemia)  DM coordinator consulted - should glucoses remain elevated. May benefit from SGLT-2inhibitior if he gets insurance   SSI - on 0-9 scale  Glucoses 170-200s   Likely  hypercoagulable state associated with uncontrolled DM  Dysphagia Malnutrition  . Secondary to stroke . Speech on board . On D2 nectar thick . Encourage po intake . Off IVF . On glucerna shakes tid . Monitor labs - stable today  UTI with fever, resolved  UA showed WBC > 50  Urine culture enterobactor and staph coag neg  Sensitive to cipro  Finished cipro 500mg  bid course (ends 9/8)  Fever resolved   Other Stroke Risk Factors   hx of stroke - small remote appearing infarcts in the left frontal lobe by CT and MRI  Other Active Problems  Microcytic anemia - iron 38, Ferritin 115 - put on iron tab bid.  Pt found on floor 04/23/19 by nursing staff. No obvious injury, complaints of pain, or neurologic changes.   Hospital day # 66   , MD PhD Stroke Neurology 05/12/2019 12:42 PM   To contact Stroke Continuity provider, please refer to 06/23/19. After hours, contact General Neurology

## 2019-05-12 NOTE — Progress Notes (Signed)
Occupational Therapy Treatment Patient Details Name: Rodney Collier MRN: [id number] DOB: [birth year] Today's Date: 05/12/2019    History of present illness Pt is a 53 y.o. M with no known PMH who presents with right sided weakness, decreased sensation on right, diplopia and headache. CT showing remote L frontal infarcts, CTA head/neck with L P2 occlusion, MRI with acute L PCA infarct including much of L thalamus, L > R cerebellum, R pons. Now s/p complete revascularization of occluded L PCA with mechanical thrombectomy. ETT 8/27-8/28; self extubated 8/28.   OT comments  Pt's RUE continues to have increased tone in flexion. Pt placed in R elbow pillow splint for 4 hours on/4 hours off once arm is extended. Splint schedule posted on light by speech instructions and RN made aware of splint. Pt would benefit from checking splint frequently to see skin and progression of flexion versus extension. Pt would benefit from continued OT skilled services. OT following acutely.    Follow Up Recommendations  SNF    Equipment Recommendations  Other (comment)(to be determined)    Recommendations for Other Services      Precautions / Restrictions Precautions Precautions: Fall Precaution Comments: R hemiparesis and inattention Restrictions Weight Bearing Restrictions: No       Mobility Bed Mobility Overal bed mobility: Needs Assistance                Transfers Overall transfer level: Needs assistance                    Balance                                           ADL either performed or assessed with clinical judgement   ADL Overall ADL's : Needs assistance/impaired                                     Functional mobility during ADLs: Moderate assistance;Maximal assistance;+2 for physical assistance;+2 for safety/equipment General ADL Comments: Pt placed in elbow pillow splint for 4 hours on/4 hours off once arm is extended. Splint  schedule posted on light by speech instructions and RN made aware of splint.     Vision   Vision Assessment?: Vision impaired- to be further tested in functional context   Perception     Praxis      Cognition Arousal/Alertness: Awake/alert Behavior During Therapy: Flat affect Overall Cognitive Status: Difficult to assess Area of Impairment: Attention;Safety/judgement;Problem solving                 Orientation Level: Place;Time;Situation;Disoriented to Current Attention Level: Sustained Memory: Decreased recall of precautions Following Commands: Follows one step commands with increased time;Follows multi-step commands inconsistently Safety/Judgement: Decreased awareness of safety;Decreased awareness of deficits Awareness: Emergent Problem Solving: Difficulty sequencing;Requires verbal cues;Requires tactile cues          Exercises General Exercises - Upper Extremity Elbow Flexion: PROM;5 reps;Right;Supine Elbow Extension: PROM;5 reps;Supine   Shoulder Instructions       General Comments      Pertinent Vitals/ Pain       Pain Assessment: Faces Faces Pain Scale: Hurts a little bit Pain Descriptors / Indicators: Grimacing Pain Intervention(s): Limited activity within patient's tolerance  Home Living  Prior Functioning/Environment              Frequency  Min 2X/week        Progress Toward Goals  OT Goals(current goals can now be found in the care plan section)  Progress towards OT goals: Progressing toward goals  Acute Rehab OT Goals Patient Stated Goal: unable OT Goal Formulation: Patient unable to participate in goal setting Time For Goal Achievement: 05/19/19 Potential to Achieve Goals: Good ADL Goals Pt Will Perform Grooming: with min assist;sitting Pt Will Transfer to Toilet: with max assist;bedside commode;stand pivot transfer Additional ADL Goal #1: pt will complete bed mobility  mod (A) as precursor to adls Additional ADL Goal #2: pt will complete 2 step command  Plan Discharge plan remains appropriate    Co-evaluation                 AM-PAC OT "6 Clicks" Daily Activity     Outcome Measure   Help from another person eating meals?: A Lot Help from another person taking care of personal grooming?: A Lot Help from another person toileting, which includes using toliet, bedpan, or urinal?: A Lot Help from another person bathing (including washing, rinsing, drying)?: A Lot Help from another person to put on and taking off regular upper body clothing?: A Lot Help from another person to put on and taking off regular lower body clothing?: Total 6 Click Score: 11    End of Session    OT Visit Diagnosis: Unsteadiness on feet (R26.81);Hemiplegia and hemiparesis Hemiplegia - Right/Left: Right Hemiplegia - dominant/non-dominant: Dominant Hemiplegia - caused by: Cerebral infarction   Activity Tolerance Patient tolerated treatment well   Patient Left in bed;with call bell/phone within reach;with bed alarm set   Nurse Communication Mobility status        Time: 8242-3536 OT Time Calculation (min): 15 min  Charges: OT General Charges $OT Visit: 1 Visit OT Treatments $Therapeutic Exercise: 8-22 mins  Rodney Collier) Rodney Collier OTR/L Acute Rehabilitation Services Pager: [phone] Office: [phone]    Rodney Collier 05/12/2019, 4:37 PM

## 2019-05-12 NOTE — NC FL2 (Signed)
[place] MEDICAID FL2 LEVEL OF CARE SCREENING TOOL     IDENTIFICATION  Patient Name: Rodney Collier Birthdate: 05-11-1966 Sex: male Admission Date (Current Location): 04/14/2019  [place] and [place] Number:  [place], [place])   Facility and Address:  [facility], [address] [place], [place] [address]      Provider Number: [id number]  Attending Physician Name and Address:  Marvel Plan, MD  Relative Name and Phone Number:       Current Level of Care: Hospital Recommended Level of Care: Skilled Nursing Facility Prior Approval Number:    Date Approved/Denied:   PASRR Number:    Discharge Plan: SNF    Current Diagnoses: Patient Active Problem List   Diagnosis Date Noted  . History of ETT   . Acute ischemic stroke (HCC) 04/14/2019  . Embolism of posterior cerebral artery, left 04/14/2019    Orientation RESPIRATION BLADDER Height & Weight     Self  Normal Incontinent Weight: 169 lb 12.1 oz (77 kg) Height:  6' (182.9 cm)  BEHAVIORAL SYMPTOMS/MOOD NEUROLOGICAL BOWEL NUTRITION STATUS      Incontinent Diet(dysphagia 2, carb modified, nectar thick liquids)  AMBULATORY STATUS COMMUNICATION OF NEEDS Skin   Extensive Assist Verbally Normal                       Personal Care Assistance Level of Assistance  Bathing, Dressing, Feeding Bathing Assistance: Maximum assistance Feeding assistance: Maximum assistance Dressing Assistance: Maximum assistance     Functional Limitations Info  Sight, Hearing, Speech Sight Info: Adequate Hearing Info: Adequate Speech Info: Impaired(dysarthria)    SPECIAL CARE FACTORS FREQUENCY  PT (By licensed PT), OT (By licensed OT), Speech therapy     PT Frequency: 5x/wk OT Frequency: 5x/wk     Speech Therapy Frequency: 5x/wk      Contractures Contractures Info: Not present    Additional Factors Info  Code Status, Allergies, Insulin Sliding Scale Code Status Info: Full Allergies Info: NKA   Insulin  Sliding Scale Info: 0-9 units 3x/day with meals; 0-5 units daily at bed       Current Medications (05/12/2019):  This is the current hospital active medication list Current Facility-Administered Medications  Medication Dose Route Frequency Provider Last Rate Last Dose  . acetaminophen (TYLENOL) tablet 650 mg  650 mg Oral Q4H PRN Layne Benton, NP   650 mg at 05/08/19 2216   Or  . acetaminophen (TYLENOL) solution 650 mg  650 mg Per Tube Q4H PRN Layne Benton, NP   650 mg at 04/15/19 2011   Or  . acetaminophen (TYLENOL) suppository 650 mg  650 mg Rectal Q4H PRN Layne Benton, NP   650 mg at 04/18/19 0233  . amLODipine (NORVASC) tablet 10 mg  10 mg Oral Daily Marvel Plan, MD   10 mg at 05/12/19 1046  . aspirin EC tablet 81 mg  81 mg Oral Daily Annie Main L, NP   81 mg at 05/12/19 1046  . atorvastatin (LIPITOR) tablet 20 mg  20 mg Oral q1800 Micki Riley, MD   20 mg at 05/11/19 1730  . benazepril (LOTENSIN) tablet 10 mg  10 mg Oral BID Marvel Plan, MD   10 mg at 05/12/19 1045  . bethanechol (URECHOLINE) tablet 25 mg  25 mg Oral TID Chilton Si, MD   25 mg at 05/12/19 1046  . enoxaparin (LOVENOX) injection 40 mg  40 mg Subcutaneous Q24H Micki Riley, MD  40 mg at 05/12/19 1046  . feeding supplement (GLUCERNA SHAKE) (GLUCERNA SHAKE) liquid 237 mL  237 mL Oral TID BM Garvin Fila, MD   237 mL at 05/12/19 1046  . hydrALAZINE (APRESOLINE) injection 5-10 mg  5-10 mg Intravenous Q2H PRN Donzetta Starch, NP   10 mg at 04/17/19 1825  . insulin aspart (novoLOG) injection 0-5 Units  0-5 Units Subcutaneous QHS Biby, Sharon L, NP      . insulin aspart (novoLOG) injection 0-9 Units  0-9 Units Subcutaneous TID WC Donzetta Starch, NP   2 Units at 05/12/19 [phone]  . levETIRAcetam (KEPPRA) 100 MG/ML solution 500 mg  500 mg Oral BID Garvin Fila, MD   500 mg at 05/12/19 1047  . MEDLINE mouth rinse  15 mL Mouth Rinse q12n4p Biby, Sharon L, NP   15 mL at 05/12/19 1051  . metFORMIN (GLUCOPHAGE)  tablet 1,000 mg  1,000 mg Oral BID WC Rosalin Hawking, MD   1,000 mg at 05/12/19 0810  . Resource ThickenUp Clear   Oral PRN Skeet Latch, MD      . senna-docusate (Senokot-S) tablet 1 tablet  1 tablet Oral QHS PRN Garvin Fila, MD   1 tablet at 05/05/19 2041  . simethicone (MYLICON) chewable tablet 80 mg  80 mg Oral QID PRN Rinehuls, Early Chars, PA-C         Discharge Medications: Please see discharge summary for a list of discharge medications.  Relevant Imaging Results:  Relevant Lab Results:   Additional Information SS#: [id number]  Geralynn Ochs, LCSW

## 2019-05-12 NOTE — TOC Progression Note (Signed)
Transition of Care [facility]) - Progression Note    Patient Details  Name: Rodney Collier MRN: [id number] Date of Birth: 09/28/1965  Transition of Care [facility]) CM/SW [place], [place] Phone Number: 05/12/2019, 12:27 PM  Clinical Narrative:   CSW following for discharge plan. CSW received email back from daughter Rodney Collier with choices for CSW to contact.   CSW reached out to [facility] [phone]) to ask about bed availability. CSW left voicemail, but got fax number off of the voicemail message. CSW faxed clinicals to the [facility].  CSW reached out to [facility] [phone]), spoke with [facility] in Admissions. [facility] is a dementia unit, but they have sister facilities in the nearby area that they can reach out to about bed availability. CSW sent referral via fax for them to review.  CSW to follow.    Expected Discharge Plan: [place] Barriers to Discharge: SNF Pending bed offer, Homeless with medical needs, Other (comment)(needs placement in [place])  Expected Discharge Plan and Services Expected Discharge Plan: [place] Choice: [place]                                         Social Determinants of Health (SDOH) Interventions    Readmission Risk Interventions No flowsheet data found.

## 2019-05-13 LAB — CBC
HCT: 43.1 % (ref 39.0–52.0)
Hemoglobin: 13.6 g/dL (ref 13.0–17.0)
MCH: 23.1 pg — ABNORMAL LOW (ref 26.0–34.0)
MCHC: 31.6 g/dL (ref 30.0–36.0)
MCV: 73.3 fL — ABNORMAL LOW (ref 80.0–100.0)
Platelets: 340 10*3/uL (ref 150–400)
RBC: 5.88 MIL/uL — ABNORMAL HIGH (ref 4.22–5.81)
RDW: 14.4 % (ref 11.5–15.5)
WBC: 5.4 10*3/uL (ref 4.0–10.5)
nRBC: 0 % (ref 0.0–0.2)

## 2019-05-13 LAB — BASIC METABOLIC PANEL
Anion gap: 12 (ref 5–15)
BUN: 12 mg/dL (ref 6–20)
CO2: 24 mmol/L (ref 22–32)
Calcium: 9.9 mg/dL (ref 8.9–10.3)
Chloride: 102 mmol/L (ref 98–111)
Creatinine, Ser: 0.72 mg/dL (ref 0.61–1.24)
GFR calc Af Amer: >60 mL/min (ref >60)
GFR calc non Af Amer: >60 mL/min (ref >60)
Glucose, Bld: 147 mg/dL — ABNORMAL HIGH (ref 70–99)
Potassium: 4 mmol/L (ref 3.5–5.1)
Sodium: 138 mmol/L (ref 135–145)

## 2019-05-13 LAB — GLUCOSE, CAPILLARY
Glucose-Capillary: 162 mg/dL — ABNORMAL HIGH (ref 70–99)
Glucose-Capillary: 75 mg/dL (ref 70–99)
Glucose-Capillary: 208 mg/dL — ABNORMAL HIGH (ref 70–99)

## 2019-05-13 NOTE — Progress Notes (Signed)
PT Cancellation Note  Patient Details Name: Griff Badley MRN: [id number] DOB: 16-Aug-1966   Cancelled Treatment:    Reason Eval/Treat Not Completed: Other (comment) Attempted to see pt for PT treatment. Pt had BM and needs complete bath and linen change. PT will continue to follow acutely.    Earney Navy, PTA Acute Rehabilitation Services Pager: [phone] Office: [phone]   05/13/2019, 12:40 PM

## 2019-05-13 NOTE — Progress Notes (Signed)
STROKE TEAM PROGRESS NOTE   INTERVAL HISTORY Pt lying in bed. Some improvement in hypophonia. Voice louder than before. No neuro changes. Lab stable.    Vitals:   05/12/19 2300 05/13/19 0306 05/13/19 0903 05/13/19 1138  BP: (!) 123/95 114/79 136/89 112/83  Pulse: 94 85 95 (!) 103  Resp: 18 18 19 17   Temp: 98.7 F (37.1 C) 98.4 F (36.9 C) 98.4 F (36.9 C) 97.9 F (36.6 C)  TempSrc: Oral Oral Oral Oral  SpO2: 100% 100% 100% 99%  Weight:      Height:        CBC:  Recent Labs  Lab 05/12/19 0317 05/13/19 0348  WBC 5.9 5.4  HGB 13.2 13.6  HCT 42.2 43.1  MCV 72.9* 73.3*  PLT 330 277    Basic Metabolic Panel:  Recent Labs  Lab 05/12/19 0317 05/13/19 0348  NA 137 138  K 4.0 4.0  CL 101 102  CO2 23 24  GLUCOSE 203* 147*  BUN 12 12  CREATININE 0.74 0.72  CALCIUM 9.5 9.9    IMAGING CT Head WO Contrast 04/16/2019 Areas of acute infarction are showing low-density and swelling but no macroscopic hemorrhage by CT. As shown by previous MRI, there is involvement of the cerebellum, left cerebral peduncle and thalamus and left posteromedial temporal lobe and occipital lobe. Tiny focus of infarction in the right occipital lobe as well.  [name] 04/15/2019 1. Acute infarction throughout the left PCA territory (including most of the left thalamus), left more than right cerebellum, and right pons. Petechial hemorrhage is seen at the level of the pons.  2. Prior left frontal infarcts and microvascular ischemic gliosis.   Cerebral angiogram 04/14/2019 4 vessel cerebral arteriogram followed by complete revascularization of occluded Lt PCA  With x 2 passes with 45mm x 76mm embotrap retriever  device achieving a TICI 3 revascularization.  Ct Code Stroke Cta Head W/wo Contrast Ct Code Stroke Cta Neck W/wo Contrast Ct Code Stroke Cta Cerebral Perfusion W/wo Contrast 04/14/2019 1. Left P2 occlusion with downstream reconstitution. Qualitative assessment of CT perfusion maps  shows ischemia without visible core infarct in the left occipital lobe.  2. Mild atherosclerosis in the neck without flow limiting stenosis or embolic source seen.  3. No large vessel occlusion in the anterior circulation.  4. Mild mid basilar narrowing.   Ct Head Code Stroke Wo Contrast 04/14/2019 1. No acute finding.  2. Small remote appearing infarcts in the left frontal lobe.    LE Doppler 04/15/2019 Right: There is no evidence of deep vein thrombosis in the lower extremity. No cystic structure found in the popliteal fossa. Left: There is no evidence of deep vein thrombosis in the lower extremity. No cystic structure found in the popliteal fossa.  2D Echocardiogram 04/15/2019  1. The left ventricle has normal systolic function, with an ejection fraction of 55-60%. The cavity size was normal. Left ventricular diastolic parameters were normal. No evidence of left ventricular regional wall motion abnormalities.  2. The right ventricle has normal systolic function. The cavity was normal. There is no increase in right ventricular wall thickness.  3. The mitral valve is abnormal. Mild thickening of the mitral valve leaflet. The MR jet is posteriorly-directed.  4. The tricuspid valve is grossly normal.  5. The aortic valve is tricuspid. No stenosis of the aortic valve.  6. The aorta is normal unless otherwise noted.  7. The inferior vena cava was normal in size with <50% respiratory variability. SUMMARY  LVEF 55-60%, normal wall thickness, normal wall motion, normal diastolic function, mild posteriorly directed MR, normal size IVC that does not collapse, no PFO by color doppler  FINDINGS  Left Ventricle: The left ventricle has normal systolic function, with an ejection fraction of 55-60%. The cavity size was normal. There is no increase in left ventricular wall thickness. Left ventricular diastolic parameters were normal. Normal left ventricular filling pressures No evidence of left ventricular  regional wall motion abnormalities..  EEG  04/15/2019 This study is suggestive of cortical dysfunction in the left hemisphere secondary to underlying stroke as well as mild diffuse encephalopathy. No seizures or definite epileptiform discharges were seen throughout the recording.  TCD w/ bubble 04/18/2019  No HTIS heard during rest. No HITS heard during valsalva.  TEE 04/19/2019 LVEF 55-60% No LA/LAA thrombus or mass No ASD or PFO by color flow Doppler or saline microcavitation study. Mild AR, mild MR.   PHYSICAL EXAM   General - Well nourished, well developed, African gentleman who is not in distress  Ophthalmologic - fundi not visualized due to noncooperation.  Cardiovascular - Regular rate and rhythm.  Neuro - awake, alert, eyes open, following most of the simple commands. Hypophonia, able to have some words spoken out.  Dysconjugate eyes, left eye CN III palsy with pupil sparing, right eye CN VI palsy, PERRL. Not blinking to visual threat on the right. Right facial droop, tongue protrusion not cooperative, against gravity of LUE but significant ataxia, able to follow simple commands with left hand. lifts left leg slightly off of the bed on command, follow simple commands of the left foot. On pain stimulation, slight withdraw of RLE but no movement of RUE. Increased muscle tone on the RUE. Positive triple reflex on the right. Sensation and gait not tested.   ASSESSMENT/PLAN [name] with no significant past medical history presenting with right-sided weakness, decreased sensation on the right, diplopia and headache. Complete revascularization of occluded L PCA w/ mechanical thrombectomy.  Stroke: embolic multifocal posterior circulation infarcts with left P2 occlusion s/p IR with TICI3 reperfusion - likely embolic   Code Stroke CT head No acute abnormality. Old infarct L frontal lobe.   CTA head & neck L P2 occlusion. Mild neck atherosclerosis . No LVO  anterior circulation. Mild mid BA narrowing.  CT perfusion ischemia w/o visible core  CT Head - 04/16/19 - Areas of acute infarction are showing low-density and swelling but no macroscopic hemorrhage by CT.    Cerebral angio TICI3 reperfusion of occluded L PCA  MRI  L PCA (including thalamus), L>R cerebellum, R pontine and left midbrain infarct. Petechial hemorrhage seen in pons. Old L frontal infarcts.   LE Doppler no DVT - no evidence of deep vein thrombosis   2D Echo - EF 55-60%.  No cardiac source of emboli identified.   TCD bubble no HITS  TEE neg  UDS - pos for benzos - UDS done on 9/1  EEG - cortical dysfunction but no seizures.  LDL 88  HgbA1c 12.3  Lovenox 40 mg sq daily  for VTE prophylaxis  No antithrombotic prior to admission, finished DAPT 3-week course, now on aspirin alone   Therapy recommendations:  CIR -> D/t lack of post hospitalization family support - will pursue SNF - will need rapid covid testing prior to D/C  Medically ready for d/c when bed found  Disposition:  pending (SW trying to help family w/ placement, MA MCD and transfer back to  [place])  Patient remains stable  Possible seizure  Full body rigid shaking hours post IR, L>R  Loaded with keppra  EEG - cortical dysfunction but no seizures.  Continue keppra 500mg  bid  Hypertension  No hx of HTN, on no home meds  Placed On cleviprex gtt for BP control post IR, now off . On amlodipine 10 . Now on lotensin to 10mg  bid  . BP100-150s . BP goal normotensive  Hyperlipidemia  Home meds:  No statin  LDL 88, goal < 70  HDL 39  On lipitor 20  Continue statin at discharge  Diabetes type II, new diagnosis, Uncontrolled Hyperglycemia, improving  HgbA1c 12.3, goal < 7.0  CBGs  continue metformin 1000mg  bid (off glipizde to 5 mg bid d/t hypoglycemia)  DM coordinator consulted - should glucoses remain elevated. May benefit from SGLT-2inhibitior if he gets insurance   SSI - on 0-9  scale  Glucoses 170-200s   Likely hypercoagulable state associated with uncontrolled DM  Dysphagia Malnutrition  . Secondary to stroke . Speech on board . On D2 nectar thick . Encourage po intake . Off IVF, lab stable . On glucerna shakes tid  UTI with fever, resolved  UA showed WBC > 50  Urine culture enterobactor and staph coag neg  Sensitive to cipro  Finished cipro 500mg  bid course (ends 9/8)  Fever resolved   Other Stroke Risk Factors   hx of stroke - small remote appearing infarcts in the left frontal lobe by CT and MRI  Other Active Problems  Microcytic anemia - iron 38, Ferritin 115 - put on iron tab bid.  Pt found on floor 04/23/19 by nursing staff. No obvious injury, complaints of pain, or neurologic changes.   Hospital day # 129   Marvel PlanJindong Keliyah Spillman, MD PhD Stroke Neurology 05/13/2019 2:07 PM   To contact Stroke Continuity provider, please refer to [email or web address]. After hours, contact General Neurology

## 2019-05-13 NOTE — Progress Notes (Signed)
Inpatient Rehab Admissions:  Inpatient Rehab Consult received. Discussed with Kathlee Nations, [place]. Pt's family pursuing possible inpatient rehab in [place].  Will sign off at this time.  Shann Medal, PT, DPT Admissions Coordinator [phone] 05/13/19  11:10 AM

## 2019-05-13 NOTE — Progress Notes (Signed)
Occupational Therapy Treatment Patient Details Name: Rodney Collier MRN: [id number] DOB: March 24, 1966 Today's Date: 05/13/2019    History of present illness Pt is a 53 y.o. M with no known PMH who presents with right sided weakness, decreased sensation on right, diplopia and headache. CT showing remote L frontal infarcts, CTA head/neck with L P2 occlusion, MRI with acute L PCA infarct including much of L thalamus, L > R cerebellum, R pons. Now s/p complete revascularization of occluded L PCA with mechanical thrombectomy. ETT 8/27-8/28; self extubated 8/28.   OT comments  Pt seen for RUE PROM and elbow splint check. Doffed splint for skin inspection; so signs or skin breakdown or redness. PROM to RUE ( see exercise section). Pt able to complete self ROM initially for shoulder flexion and wrist flexion/ extension but lost interest in task quickly. RN stated they just put it back on so left splind on RUE. Dc plan remains appropriate. Will continue to follow for acute OT needs.   Follow Up Recommendations  SNF    Equipment Recommendations  Other (comment)(tbd to next venue)    Recommendations for Other Services      Precautions / Restrictions Precautions Precautions: Fall Precaution Comments: R hemiparesis and inattention Restrictions Weight Bearing Restrictions: No       Mobility Bed Mobility Overal bed mobility: Needs Assistance Bed Mobility: Rolling Rolling: Min assist         General bed mobility comments: Pt utilizing railing with LUE and attempting to scoot around when RN requested pt to scoot up in bed. Not able to fully roll to remove bed pad  and required assist to get to sidelying.  Transfers                      Balance                                           ADL either performed or assessed with clinical judgement   ADL                                         General ADL Comments: doffed elbow splint and checked for  skin breakdown; no sign of irritation or redness; deferred ADLs this session     Vision   Vision Assessment?: Vision impaired- to be further tested in functional context   Perception     Praxis      Cognition Arousal/Alertness: Awake/alert Behavior During Therapy: Flat affect Overall Cognitive Status: Difficult to assess Area of Impairment: Attention;Safety/judgement;Problem solving                 Orientation Level: Place;Time;Situation;Disoriented to Current Attention Level: Sustained Memory: Decreased recall of precautions Following Commands: Follows one step commands with increased time;Follows multi-step commands inconsistently Safety/Judgement: Decreased awareness of safety;Decreased awareness of deficits Awareness: Emergent Problem Solving: Difficulty sequencing;Requires verbal cues;Requires tactile cues General Comments: Pt following most commands; flat affect and mouthing words.        Exercises General Exercises - Upper Extremity Shoulder Flexion: PROM;5 reps;Supine;Right Elbow Flexion: PROM;5 reps;Right;Supine Elbow Extension: PROM;5 reps;Supine;Right Wrist Flexion: AAROM;Right;Supine Wrist Extension: Right;5 reps;AAROM;Supine Shoulder Exercises Shoulder External Rotation: PROM;Right;5 reps;Supine   Shoulder Instructions       General Comments      Pertinent Vitals/  Pain       Pain Assessment: Faces Faces Pain Scale: Hurts little more Pain Location: general grimacing with PROM sh flexion Pain Intervention(s): Monitored during session;Limited activity within patient's tolerance;Repositioned  Home Living                                          Prior Functioning/Environment              Frequency  Min 2X/week        Progress Toward Goals  OT Goals(current goals can now be found in the care plan section)  Progress towards OT goals: Progressing toward goals  Acute Rehab OT Goals Patient Stated Goal: unable OT Goal  Formulation: Patient unable to participate in goal setting Time For Goal Achievement: 05/19/19 Potential to Achieve Goals: Good  Plan Discharge plan remains appropriate    Co-evaluation                 AM-PAC OT "6 Clicks" Daily Activity     Outcome Measure   Help from another person eating meals?: A Lot Help from another person taking care of personal grooming?: A Lot Help from another person toileting, which includes using toliet, bedpan, or urinal?: A Lot Help from another person bathing (including washing, rinsing, drying)?: A Lot Help from another person to put on and taking off regular upper body clothing?: A Lot Help from another person to put on and taking off regular lower body clothing?: Total 6 Click Score: 11    End of Session    OT Visit Diagnosis: Unsteadiness on feet (R26.81);Hemiplegia and hemiparesis Hemiplegia - Right/Left: Right Hemiplegia - dominant/non-dominant: Dominant Hemiplegia - caused by: Cerebral infarction   Activity Tolerance Patient tolerated treatment well   Patient Left in bed;with call bell/phone within reach;with bed alarm set   Nurse Communication Mobility status        Time: 4818-5631 OT Time Calculation (min): 12 min  Charges: OT General Charges $OT Visit: 1 Visit OT Treatments $Therapeutic Exercise: 8-22 mins  [place], [place] [phone] [place] 05/13/2019, 4:50 PM

## 2019-05-13 NOTE — Progress Notes (Signed)
PT Cancellation Note  Patient Details Name: Rodney Collier MRN: [id number] DOB: [birth year]   Cancelled Treatment:    Reason Eval/Treat Not Completed: Patient declined, no reason specified Attempted to see pt for second time today. Pt declines participating in therapy and resistive to any attempt to mobilize. Pt with unintelligible speech so unable to identify pt's reason for declining. PT will continue to follow acutely.    Earney Navy, PTA Acute Rehabilitation Services Pager: [phone] Office: [phone]   05/13/2019, 4:01 PM

## 2019-05-13 NOTE — Progress Notes (Signed)
SLP Cancellation Note  Patient Details Name: Rochelle Nephew MRN: [id number] DOB: 01-13-1966   Cancelled treatment:       Reason Eval/Treat Not Completed: Patient at procedure or test/unavailable.  Will continue efforts    Juan Quam Laurice 05/13/2019, 11:08 AM

## 2019-05-14 LAB — GLUCOSE, CAPILLARY
Glucose-Capillary: 174 mg/dL — ABNORMAL HIGH (ref 70–99)
Glucose-Capillary: 177 mg/dL — ABNORMAL HIGH (ref 70–99)
Glucose-Capillary: 225 mg/dL — ABNORMAL HIGH (ref 70–99)
Glucose-Capillary: 103 mg/dL — ABNORMAL HIGH (ref 70–99)

## 2019-05-14 NOTE — Progress Notes (Signed)
STROKE TEAM PROGRESS NOTE   INTERVAL HISTORY Pt lying in bed. Some improvement in hypophonia. Voice louder than before. No neuro changes. Labs stable.    Vitals:   05/13/19 1543 05/13/19 1938 05/13/19 2305 05/14/19 0326  BP: 115/85 124/80 (!) 149/92 110/68  Pulse: 98 100 90 81  Resp: 18 16 16 16   Temp: 98.2 F (36.8 C) 98.1 F (36.7 C) 98.2 F (36.8 C) 97.9 F (36.6 C)  TempSrc: Oral Oral Oral Oral  SpO2: 99% 96% 100% 98%  Weight:      Height:        CBC:  Recent Labs  Lab 05/12/19 0317 05/13/19 0348  WBC 5.9 5.4  HGB 13.2 13.6  HCT 42.2 43.1  MCV 72.9* 73.3*  PLT 330 016    Basic Metabolic Panel:  Recent Labs  Lab 05/12/19 0317 05/13/19 0348  NA 137 138  K 4.0 4.0  CL 101 102  CO2 23 24  GLUCOSE 203* 147*  BUN 12 12  CREATININE 0.74 0.72  CALCIUM 9.5 9.9    IMAGING CT Head WO Contrast 04/16/2019 Areas of acute infarction are showing low-density and swelling but no macroscopic hemorrhage by CT. As shown by previous MRI, there is involvement of the cerebellum, left cerebral peduncle and thalamus and left posteromedial temporal lobe and occipital lobe. Tiny focus of infarction in the right occipital lobe as well.  [name] 04/15/2019 1. Acute infarction throughout the left PCA territory (including most of the left thalamus), left more than right cerebellum, and right pons. Petechial hemorrhage is seen at the level of the pons.  2. Prior left frontal infarcts and microvascular ischemic gliosis.   Cerebral angiogram 04/14/2019 4 vessel cerebral arteriogram followed by complete revascularization of occluded Lt PCA  With x 2 passes with 2mm x 44mm embotrap retriever  device achieving a TICI 3 revascularization.  Ct Code Stroke Cta Head W/wo Contrast Ct Code Stroke Cta Neck W/wo Contrast Ct Code Stroke Cta Cerebral Perfusion W/wo Contrast 04/14/2019 1. Left P2 occlusion with downstream reconstitution. Qualitative assessment of CT perfusion maps  shows ischemia without visible core infarct in the left occipital lobe.  2. Mild atherosclerosis in the neck without flow limiting stenosis or embolic source seen.  3. No large vessel occlusion in the anterior circulation.  4. Mild mid basilar narrowing.   Ct Head Code Stroke Wo Contrast 04/14/2019 1. No acute finding.  2. Small remote appearing infarcts in the left frontal lobe.    LE Doppler 04/15/2019 Right: There is no evidence of deep vein thrombosis in the lower extremity. No cystic structure found in the popliteal fossa. Left: There is no evidence of deep vein thrombosis in the lower extremity. No cystic structure found in the popliteal fossa.  2D Echocardiogram 04/15/2019  1. The left ventricle has normal systolic function, with an ejection fraction of 55-60%. The cavity size was normal. Left ventricular diastolic parameters were normal. No evidence of left ventricular regional wall motion abnormalities.  2. The right ventricle has normal systolic function. The cavity was normal. There is no increase in right ventricular wall thickness.  3. The mitral valve is abnormal. Mild thickening of the mitral valve leaflet. The MR jet is posteriorly-directed.  4. The tricuspid valve is grossly normal.  5. The aortic valve is tricuspid. No stenosis of the aortic valve.  6. The aorta is normal unless otherwise noted.  7. The inferior vena cava was normal in size with <50% respiratory variability. SUMMARY LVEF  55-60%, normal wall thickness, normal wall motion, normal diastolic function, mild posteriorly directed MR, normal size IVC that does not collapse, no PFO by color doppler  FINDINGS  Left Ventricle: The left ventricle has normal systolic function, with an ejection fraction of 55-60%. The cavity size was normal. There is no increase in left ventricular wall thickness. Left ventricular diastolic parameters were normal. Normal left ventricular filling pressures No evidence of left ventricular  regional wall motion abnormalities..  EEG  04/15/2019 This study is suggestive of cortical dysfunction in the left hemisphere secondary to underlying stroke as well as mild diffuse encephalopathy. No seizures or definite epileptiform discharges were seen throughout the recording.  TCD w/ bubble 04/18/2019  No HTIS heard during rest. No HITS heard during valsalva.  TEE 04/19/2019 LVEF 55-60% No LA/LAA thrombus or mass No ASD or PFO by color flow Doppler or saline microcavitation study. Mild AR, mild MR.   PHYSICAL EXAM  No changes, neurostable General - Well nourished, well developed, African gentleman who is not in distress  Ophthalmologic - fundi not visualized due to noncooperation.  Cardiovascular - Regular rate and rhythm.  Neuro - awake, alert, eyes open, following most of the simple commands. Hypophonia, able to have some words spoken out.  Dysconjugate eyes, left eye CN III palsy with pupil sparing, right eye CN VI palsy, PERRL. Not blinking to visual threat on the right. Right facial droop, tongue protrusion not cooperative, against gravity of LUE but significant ataxia, able to follow simple commands with left hand. lifts left leg slightly off of the bed on command, follow simple commands of the left foot. On pain stimulation, slight withdraw of RLE but no movement of RUE. Increased muscle tone on the RUE. Positive triple reflex on the right. Sensation and gait not tested.   ASSESSMENT/PLAN Rodney Collier is a 53 y.o. male with no significant past medical history presenting with right-sided weakness, decreased sensation on the right, diplopia and headache. Complete revascularization of occluded L PCA w/ mechanical thrombectomy.  Stroke: embolic multifocal posterior circulation infarcts with left P2 occlusion s/p IR with TICI3 reperfusion - likely embolic   Code Stroke CT head No acute abnormality. Old infarct L frontal lobe.   CTA head & neck L P2 occlusion. Mild neck  atherosclerosis . No LVO anterior circulation. Mild mid BA narrowing.  CT perfusion ischemia w/o visible core  CT Head - 04/16/19 - Areas of acute infarction are showing low-density and swelling but no macroscopic hemorrhage by CT.    Cerebral angio TICI3 reperfusion of occluded L PCA  MRI  L PCA (including thalamus), L>R cerebellum, R pontine and left midbrain infarct. Petechial hemorrhage seen in pons. Old L frontal infarcts.   LE Doppler no DVT - no evidence of deep vein thrombosis   2D Echo - EF 55-60%.  No cardiac source of emboli identified.   TCD bubble no HITS  TEE neg  UDS - pos for benzos - UDS done on 9/1  EEG - cortical dysfunction but no seizures.  LDL 88  HgbA1c 12.3  Lovenox 40 mg sq daily  for VTE prophylaxis  No antithrombotic prior to admission, finished DAPT 3-week course, now on aspirin alone   Therapy recommendations:  CIR -> D/t lack of post hospitalization family support - will pursue SNF - will need rapid covid testing prior to D/C  Medically ready for d/c when bed found  Disposition:  pending (SW trying to help family w/ placement, MA MCD and transfer back  to [place])  Patient remains stable  Possible seizure  Full body rigid shaking hours post IR, L>R  Loaded with keppra  EEG - cortical dysfunction but no seizures.  Continue keppra 500mg  bid  Hypertension  No hx of HTN, on no home meds  Placed On cleviprex gtt for BP control post IR, now off . On amlodipine 10 . Now on lotensin to 10mg  bid  . BP100-150s . BP goal normotensive  Hyperlipidemia  Home meds:  No statin  LDL 88, goal < 70  HDL 39  On lipitor 20  Continue statin at discharge  Diabetes type II, new diagnosis, Uncontrolled Hyperglycemia, improving  HgbA1c 12.3, goal < 7.0  CBGs  continue metformin 1000mg  bid (off glipizde to 5 mg bid d/t hypoglycemia)  DM coordinator consulted - should glucoses remain elevated. May benefit from SGLT-2inhibitior if he gets  insurance   SSI - on 0-9 scale  Glucoses 170-200s   Likely hypercoagulable state associated with uncontrolled DM  Dysphagia Malnutrition  . Secondary to stroke . Speech on board . On D2 nectar thick . Encourage po intake . Off IVF, lab stable . On glucerna shakes tid  UTI with fever, resolved  UA showed WBC > 50  Urine culture enterobactor and staph coag neg  Sensitive to cipro  Finished cipro 500mg  bid course (ends 9/8)  Fever resolved   Other Stroke Risk Factors   hx of stroke - small remote appearing infarcts in the left frontal lobe by CT and MRI  Other Active Problems  Microcytic anemia - iron 38, Ferritin 115 - put on iron tab bid.  Pt found on floor 04/23/19 by nursing staff. No obvious injury, complaints of pain, or neurologic changes.   Hospital day # 30   Personally examined patient and images, and have participated in and made any corrections needed to history, physical, neuro exam,assessment and plan as stated above.  I have personally obtained the history, evaluated lab date, reviewed imaging studies and agree with radiology interpretations.    , MD Stroke Neurology   A total of 25 minutes was spent for the care of this patient, spent on counseling patient and family on different diagnostic and therapeutic options, counseling and coordination of care, riskd ans benefits of management, compliance, or risk factor reduction and education.      To contact Stroke Continuity provider, please refer to . After hours, contact General Neurology

## 2019-05-15 LAB — GLUCOSE, CAPILLARY
Glucose-Capillary: 200 mg/dL — ABNORMAL HIGH (ref 70–99)
Glucose-Capillary: 227 mg/dL — ABNORMAL HIGH (ref 70–99)
Glucose-Capillary: 143 mg/dL — ABNORMAL HIGH (ref 70–99)
Glucose-Capillary: 149 mg/dL — ABNORMAL HIGH (ref 70–99)

## 2019-05-15 NOTE — Progress Notes (Signed)
STROKE TEAM PROGRESS NOTE   INTERVAL HISTORY Pt lying in bed. Today hypophonic. No neuro changes. Labs stable.    Vitals:   05/14/19 1700 05/14/19 1956 05/15/19 0028 05/15/19 0427  BP:  118/90 109/74 107/75  Pulse: 89 (!) 102 87 82  Resp:  16 16 16   Temp:  99.3 F (37.4 C) 98.6 F (37 C) 98.2 F (36.8 C)  TempSrc:  Oral    SpO2:  99% 98% 100%  Weight:      Height:        CBC:  Recent Labs  Lab 05/12/19 0317 05/13/19 0348  WBC 5.9 5.4  HGB 13.2 13.6  HCT 42.2 43.1  MCV 72.9* 73.3*  PLT 330 340    Basic Metabolic Panel:  Recent Labs  Lab 05/12/19 0317 05/13/19 0348  NA 137 138  K 4.0 4.0  CL 101 102  CO2 23 24  GLUCOSE 203* 147*  BUN 12 12  CREATININE 0.74 0.72  CALCIUM 9.5 9.9    IMAGING CT Head WO Contrast 04/16/2019 Areas of acute infarction are showing low-density and swelling but no macroscopic hemorrhage by CT. As shown by previous MRI, there is involvement of the cerebellum, left cerebral peduncle and thalamus and left posteromedial temporal lobe and occipital lobe. Tiny focus of infarction in the right occipital lobe as well.  [name] 04/15/2019 1. Acute infarction throughout the left PCA territory (including most of the left thalamus), left more than right cerebellum, and right pons. Petechial hemorrhage is seen at the level of the pons.  2. Prior left frontal infarcts and microvascular ischemic gliosis.   Cerebral angiogram 04/14/2019 4 vessel cerebral arteriogram followed by complete revascularization of occluded Lt PCA  With x 2 passes with 48mm x 59mm embotrap retriever  device achieving a TICI 3 revascularization.  Ct Code Stroke Cta Head W/wo Contrast Ct Code Stroke Cta Neck W/wo Contrast Ct Code Stroke Cta Cerebral Perfusion W/wo Contrast 04/14/2019 1. Left P2 occlusion with downstream reconstitution. Qualitative assessment of CT perfusion maps shows ischemia without visible core infarct in the left occipital lobe.  2. Mild  atherosclerosis in the neck without flow limiting stenosis or embolic source seen.  3. No large vessel occlusion in the anterior circulation.  4. Mild mid basilar narrowing.   Ct Head Code Stroke Wo Contrast 04/14/2019 1. No acute finding.  2. Small remote appearing infarcts in the left frontal lobe.    LE Doppler 04/15/2019 Right: There is no evidence of deep vein thrombosis in the lower extremity. No cystic structure found in the popliteal fossa. Left: There is no evidence of deep vein thrombosis in the lower extremity. No cystic structure found in the popliteal fossa.  2D Echocardiogram 04/15/2019  1. The left ventricle has normal systolic function, with an ejection fraction of 55-60%. The cavity size was normal. Left ventricular diastolic parameters were normal. No evidence of left ventricular regional wall motion abnormalities.  2. The right ventricle has normal systolic function. The cavity was normal. There is no increase in right ventricular wall thickness.  3. The mitral valve is abnormal. Mild thickening of the mitral valve leaflet. The MR jet is posteriorly-directed.  4. The tricuspid valve is grossly normal.  5. The aortic valve is tricuspid. No stenosis of the aortic valve.  6. The aorta is normal unless otherwise noted.  7. The inferior vena cava was normal in size with <50% respiratory variability. SUMMARY LVEF 55-60%, normal wall thickness, normal wall motion, normal diastolic  function, mild posteriorly directed MR, normal size IVC that does not collapse, no PFO by color doppler  FINDINGS  Left Ventricle: The left ventricle has normal systolic function, with an ejection fraction of 55-60%. The cavity size was normal. There is no increase in left ventricular wall thickness. Left ventricular diastolic parameters were normal. Normal left ventricular filling pressures No evidence of left ventricular regional wall motion abnormalities..  EEG  04/15/2019 This study is suggestive  of cortical dysfunction in the left hemisphere secondary to underlying stroke as well as mild diffuse encephalopathy. No seizures or definite epileptiform discharges were seen throughout the recording.  TCD w/ bubble 04/18/2019  No HTIS heard during rest. No HITS heard during valsalva.  TEE 04/19/2019 LVEF 55-60% No LA/LAA thrombus or mass No ASD or PFO by color flow Doppler or saline microcavitation study. Mild AR, mild MR.   PHYSICAL EXAM  No changes, neurostable General - Well nourished, well developed, African gentleman who is not in distress  Ophthalmologic - fundi not visualized due to noncooperation.  Cardiovascular - Regular rate and rhythm.  Neuro - awake, alert, eyes open, following most of the simple commands. Hypophonia, able to have some words spoken out.  Dysconjugate eyes, left eye CN III palsy with pupil sparing, right eye CN VI palsy, PERRL. Not blinking to visual threat on the right. Right facial droop, tongue protrusion not cooperative, against gravity of LUE but significant ataxia, able to follow simple commands with left hand. lifts left leg slightly off of the bed on command, follow simple commands of the left foot. On pain stimulation, slight withdraw of RLE but no movement of RUE. Increased muscle tone on the RUE. Positive triple reflex on the right. Sensation and gait not tested.   ASSESSMENT/PLAN [name] with no significant past medical history presenting with right-sided weakness, decreased sensation on the right, diplopia and headache. Complete revascularization of occluded L PCA w/ mechanical thrombectomy.  Stroke: embolic multifocal posterior circulation infarcts with left P2 occlusion s/p IR with TICI3 reperfusion - likely embolic   Code Stroke CT head No acute abnormality. Old infarct L frontal lobe.   CTA head & neck L P2 occlusion. Mild neck atherosclerosis . No LVO anterior circulation. Mild mid BA narrowing.  CT perfusion  ischemia w/o visible core  CT Head - 04/16/19 - Areas of acute infarction are showing low-density and swelling but no macroscopic hemorrhage by CT.    Cerebral angio TICI3 reperfusion of occluded L PCA  MRI  L PCA (including thalamus), L>R cerebellum, R pontine and left midbrain infarct. Petechial hemorrhage seen in pons. Old L frontal infarcts.   LE Doppler no DVT - no evidence of deep vein thrombosis   2D Echo - EF 55-60%.  No cardiac source of emboli identified.   TCD bubble no HITS  TEE neg  UDS - pos for benzos - UDS done on 9/1  EEG - cortical dysfunction but no seizures.  LDL 88  HgbA1c 12.3  Lovenox 40 mg sq daily  for VTE prophylaxis  No antithrombotic prior to admission, finished DAPT 3-week course, now on aspirin alone   Therapy recommendations:  CIR -> D/t lack of post hospitalization family support - will pursue SNF - will need rapid covid testing prior to D/C  Medically ready for d/c when bed found  Disposition:  pending (SW trying to help family w/ placement, [place] MCD and transfer back to [place])  Patient remains stable  Possible seizure  Full body rigid shaking hours post IR, L>R  Loaded with keppra  EEG - cortical dysfunction but no seizures.  Continue keppra 500mg  bid  Hypertension  No hx of HTN, on no home meds  Placed On cleviprex gtt for BP control post IR, now off . On amlodipine 10 . Now on lotensin to 10mg  bid  . BP100-150s . BP goal normotensive  Hyperlipidemia  Home meds:  No statin  LDL 88, goal < 70  HDL 39  On lipitor 20  Continue statin at discharge  Diabetes type II, new diagnosis, Uncontrolled Hyperglycemia, improving  HgbA1c 12.3, goal < 7.0  CBGs  continue metformin 1000mg  bid (off glipizde to 5 mg bid d/t hypoglycemia)  DM coordinator consulted - should glucoses remain elevated. May benefit from SGLT-2inhibitior if he gets insurance   SSI - on 0-9 scale  Glucoses 170-200s   Likely hypercoagulable state  associated with uncontrolled DM  Dysphagia Malnutrition  . Secondary to stroke . Speech on board . On D2 nectar thick . Encourage po intake . Off IVF, lab stable . On glucerna shakes tid  UTI with fever, resolved  UA showed WBC > 50  Urine culture enterobactor and staph coag neg  Sensitive to cipro  Finished cipro 500mg  bid course (ends 9/8)  Fever resolved   Other Stroke Risk Factors   hx of stroke - small remote appearing infarcts in the left frontal lobe by CT and MRI  Other Active Problems  Microcytic anemia - iron 38, Ferritin 115 - put on iron tab bid.  Pt found on floor 04/23/19 by nursing staff. No obvious injury, complaints of pain, or neurologic changes.   Hospital day # 55   Personally examined patient and images, and have participated in and made any corrections needed to history, physical, neuro exam,assessment and plan as stated above.  I have personally obtained the history, evaluated lab date, reviewed imaging studies and agree with radiology interpretations.       A total of 15 minutes was spent for the care of this patient, spent on counseling patient and family on different diagnostic and therapeutic options, counseling and coordination of care, riskd ans benefits of management, compliance, or risk factor reduction and education.      To contact Stroke Continuity provider, please refer to [email or web address] After hours, contact General Neurology

## 2019-05-16 LAB — GLUCOSE, CAPILLARY
Glucose-Capillary: 161 mg/dL — ABNORMAL HIGH (ref 70–99)
Glucose-Capillary: 163 mg/dL — ABNORMAL HIGH (ref 70–99)
Glucose-Capillary: 169 mg/dL — ABNORMAL HIGH (ref 70–99)
Glucose-Capillary: 91 mg/dL (ref 70–99)
Glucose-Capillary: 212 mg/dL — ABNORMAL HIGH (ref 70–99)

## 2019-05-16 NOTE — TOC Progression Note (Signed)
Transition of Care [facility]) - Progression Note    Patient Details  Name: Rodney Collier MRN: [id number] Date of Birth: Jul 15, 1966  [facility]) CM/SW [place],  Phone Number: 05/16/2019, 4:04 PM  Clinical Narrative:   CSW following for discharge plan. Patient has no bed offers for SNF, and no facility that was contacted last week has called back. CSW spoke with patient's daughter Rodney Collier, she has been trying to obtain a power of attorney for the patient and has been having trouble. Per a legal advice website that she found online, she was informed that the power of attorney would need to be completed in [place] and not something that she would be able to do. CSW attempted to explain to the daughter that we can't do POA here at the hospital as patient is not fully oriented, therefore not ethically able to give consent. But daughter discussed how she is able to get him to answer yes or no questions to where she feels like he understands and is aware of what's going on. CSW to check with MD about whether or not he could sign POA paperwork to indicate that his daughter should be his POA.  CSW also reached back out to [facility] and [facility] and left voicemails again. CSW to continue to follow.    Expected Discharge Plan: [place] Barriers to Discharge: SNF Pending bed offer, Homeless with medical needs, Other (comment)(needs placement in [place])  Expected Discharge Plan and Services Expected Discharge Plan: [place] Choice: [place]                                         Social Determinants of Health (SDOH) Interventions    Readmission Risk Interventions No flowsheet data found.

## 2019-05-16 NOTE — Progress Notes (Signed)
Pt fell out of recliner today. Pt had no injuries. Pt assessed by RN, vital signs within pts limits. Pt is back in the bed with bed alarm set to highest sensitivity. Chair alarm was activated when pt slid out of the chair. RN will continue to monitor pt.

## 2019-05-16 NOTE — Progress Notes (Signed)
Physical Therapy Treatment Patient Details Name: Rodney Collier MRN: [id number] DOB: 27-Dec-1965 Today's Date: 05/16/2019    History of Present Illness Pt is a 53 y.o. M with no known PMH who presents with right sided weakness, decreased sensation on right, diplopia and headache. CT showing remote L frontal infarcts, CTA head/neck with L P2 occlusion, MRI with acute L PCA infarct including much of L thalamus, L > R cerebellum, R pons. Now s/p complete revascularization of occluded L PCA with mechanical thrombectomy. ETT 8/27-8/28; self extubated 8/28.    PT Comments    Pt progressing towards physical therapy goals. Pt participated in ~15 minutes of standing activity EOB with 1 seated rest break in between. Pt demonstrating R side movement and muscle activation with increased time. With fatigue, pt appears more withdrawn but seemed appreciative of therapists. Pt is appropriate for return to bed with nursing staff utilizing [facility] or stand pivot transfer with +2 assist. Will continue to follow and progress as able per POC.     Follow Up Recommendations  CIR;Supervision/Assistance - 24 hour     Equipment Recommendations  Wheelchair (measurements PT);Wheelchair cushion (measurements PT);3in1 (PT);Hospital bed    Recommendations for Other Services Rehab consult     Precautions / Restrictions Precautions Precautions: Fall Precaution Comments: R hemiparesis and inattention. R shoulder subluxation. Restrictions Weight Bearing Restrictions: No    Mobility  Bed Mobility Overal bed mobility: Needs Assistance Bed Mobility: Supine to Sit     Supine to sit: Mod assist;+2 for safety/equipment     General bed mobility comments: OT supporting RLE - pt able to demonstrate some muscular engagement to bring LE towards EOB but required assist to complete. Able to demonstrate a larger movement on the L but apraxic and required assist to get LLE moving in right direction. Up to mod assist for trunk  flexion into sitting.   Transfers Overall transfer level: Needs assistance Equipment used: 2 person hand held assist Transfers: Sit to/from [employer] Sit to Stand: Min assist;+2 physical assistance Stand pivot transfers: Mod assist;+2 physical assistance       General transfer comment: Sit<>stand x2 with SPT at end of session to chair. With standing activity EOB pt demonstrated quad activation to achieve knee extension with assist, and was able to weight shift with minimal facilitation from therapists. Pt stood ~15 minutes total.   Ambulation/Gait             General Gait Details: Pt participated in pre-gait activity at EOB including weight shifts, and scooting L foot in towards R and then out towards therapist.    Stairs             Wheelchair Mobility    Modified Rankin (Stroke Patients Only) Modified Rankin (Stroke Patients Only) Pre-Morbid Rankin Score: No symptoms Modified Rankin: Moderately severe disability     Balance Overall balance assessment: Needs assistance Sitting-balance support: Single extremity supported;Feet supported Sitting balance-Leahy Scale: Fair Sitting balance - Comments: pt able to maintain static balance with close minguard assist, mostly reliant on LUE support. sitting balance improved with time. Postural control: Right lateral lean;Posterior lean Standing balance support: Single extremity supported Standing balance-Leahy Scale: Poor Standing balance comment: able to maintain static balance with support and gross min assist                            Cognition Arousal/Alertness: Awake/alert Behavior During Therapy: Flat affect Overall Cognitive Status: Difficult to assess Area  of Impairment: Attention;Safety/judgement;Problem solving                 Orientation Level: Place;Time;Situation;Disoriented to Current Attention Level: Sustained Memory: Decreased recall of precautions Following  Commands: Follows one step commands with increased time;Follows multi-step commands inconsistently Safety/Judgement: Decreased awareness of safety;Decreased awareness of deficits Awareness: Emergent Problem Solving: Difficulty sequencing;Requires verbal cues;Requires tactile cues General Comments: Pt following most commands; flat affect overall. Pt verbalized the best when being asked about his children. At end of session pt more withdrawn and not attemtping to answer questions as eagerly.       Exercises      General Comments        Pertinent Vitals/Pain Pain Assessment: Faces Faces Pain Scale: Hurts a little bit Pain Location: Rt UE  Pain Descriptors / Indicators: Grimacing Pain Intervention(s): Limited activity within patient's tolerance;Monitored during session;Repositioned    Home Living                      Prior Function            PT Goals (current goals can now be found in the care plan section) Acute Rehab PT Goals Patient Stated Goal: unable PT Goal Formulation: Patient unable to participate in goal setting Time For Goal Achievement: 05/16/19 Potential to Achieve Goals: Good Progress towards PT goals: Progressing toward goals    Frequency    Min 3X/week      PT Plan Current plan remains appropriate    Co-evaluation PT/OT/SLP Co-Evaluation/Treatment: Yes Reason for Co-Treatment: Complexity of the patient's impairments (multi-system involvement);Necessary to address cognition/behavior during functional activity;For patient/therapist safety;To address functional/ADL transfers PT goals addressed during session: Mobility/safety with mobility;Balance;Strengthening/ROM        AM-PAC PT "6 Clicks" Mobility   Outcome Measure  Help needed turning from your back to your side while in a flat bed without using bedrails?: A Lot Help needed moving from lying on your back to sitting on the side of a flat bed without using bedrails?: A Lot Help needed  moving to and from a bed to a chair (including a wheelchair)?: A Lot Help needed standing up from a chair using your arms (e.g., wheelchair or bedside chair)?: A Lot Help needed to walk in hospital room?: Total Help needed climbing 3-5 steps with a railing? : Total 6 Click Score: 10    End of Session Equipment Utilized During Treatment: Gait belt Activity Tolerance: Patient tolerated treatment well Patient left: in chair;with chair alarm set;with call bell/phone within reach Nurse Communication: Mobility status;Need for lift equipment PT Visit Diagnosis: Unsteadiness on feet (R26.81);Muscle weakness (generalized) (M62.81);Difficulty in walking, not elsewhere classified (R26.2);Hemiplegia and hemiparesis Hemiplegia - Right/Left: Right Hemiplegia - dominant/non-dominant: Non-dominant Hemiplegia - caused by: Cerebral infarction     Time: 1203-1245 PT Time Calculation (min) (ACUTE ONLY): 42 min  Charges:  $Gait Training: 8-22 mins                     Rolinda Roan, PT, DPT Acute Rehabilitation Services Pager: [phone] Office: [phone]    Thelma Comp 05/16/2019, 3:19 PM

## 2019-05-16 NOTE — Progress Notes (Signed)
STROKE TEAM PROGRESS NOTE   INTERVAL HISTORY Pt sitting in bed.  Remains hypophonic. No neuro changes.  He is working with his therapist.  Vitals:   05/16/19 0035 05/16/19 0426 05/16/19 0732 05/16/19 1118  BP: 125/88 119/76 (!) 141/106 121/87  Pulse: 99 74 (!) 105 92  Resp: 18 18 16 20   Temp: 98.8 F (37.1 C) 98.6 F (37 C) 98.5 F (36.9 C) 98.9 F (37.2 C)  TempSrc:  Oral Oral Oral  SpO2: 96%   99%  Weight:      Height:        CBC:  Recent Labs  Lab 05/12/19 0317 05/13/19 0348  WBC 5.9 5.4  HGB 13.2 13.6  HCT 42.2 43.1  MCV 72.9* 73.3*  PLT 330 340    Basic Metabolic Panel:  Recent Labs  Lab 05/12/19 0317 05/13/19 0348  NA 137 138  K 4.0 4.0  CL 101 102  CO2 23 24  GLUCOSE 203* 147*  BUN 12 12  CREATININE 0.74 0.72  CALCIUM 9.5 9.9    IMAGING CT Head WO Contrast 04/16/2019 Areas of acute infarction are showing low-density and swelling but no macroscopic hemorrhage by CT. As shown by previous MRI, there is involvement of the cerebellum, left cerebral peduncle and thalamus and left posteromedial temporal lobe and occipital lobe. Tiny focus of infarction in the right occipital lobe as well.  [name] 04/15/2019 1. Acute infarction throughout the left PCA territory (including most of the left thalamus), left more than right cerebellum, and right pons. Petechial hemorrhage is seen at the level of the pons.  2. Prior left frontal infarcts and microvascular ischemic gliosis.   Cerebral angiogram 04/14/2019 4 vessel cerebral arteriogram followed by complete revascularization of occluded Lt PCA  With x 2 passes with 68mm x 73mm embotrap retriever  device achieving a TICI 3 revascularization.  Ct Code Stroke Cta Head W/wo Contrast Ct Code Stroke Cta Neck W/wo Contrast Ct Code Stroke Cta Cerebral Perfusion W/wo Contrast 04/14/2019 1. Left P2 occlusion with downstream reconstitution. Qualitative assessment of CT perfusion maps shows ischemia without  visible core infarct in the left occipital lobe.  2. Mild atherosclerosis in the neck without flow limiting stenosis or embolic source seen.  3. No large vessel occlusion in the anterior circulation.  4. Mild mid basilar narrowing.   Ct Head Code Stroke Wo Contrast 04/14/2019 1. No acute finding.  2. Small remote appearing infarcts in the left frontal lobe.    LE Doppler 04/15/2019 Right: There is no evidence of deep vein thrombosis in the lower extremity. No cystic structure found in the popliteal fossa. Left: There is no evidence of deep vein thrombosis in the lower extremity. No cystic structure found in the popliteal fossa.  2D Echocardiogram 04/15/2019  1. The left ventricle has normal systolic function, with an ejection fraction of 55-60%. The cavity size was normal. Left ventricular diastolic parameters were normal. No evidence of left ventricular regional wall motion abnormalities.  2. The right ventricle has normal systolic function. The cavity was normal. There is no increase in right ventricular wall thickness.  3. The mitral valve is abnormal. Mild thickening of the mitral valve leaflet. The MR jet is posteriorly-directed.  4. The tricuspid valve is grossly normal.  5. The aortic valve is tricuspid. No stenosis of the aortic valve.  6. The aorta is normal unless otherwise noted.  7. The inferior vena cava was normal in size with <50% respiratory variability. SUMMARY LVEF 55-60%,  normal wall thickness, normal wall motion, normal diastolic function, mild posteriorly directed MR, normal size IVC that does not collapse, no PFO by color doppler  FINDINGS  Left Ventricle: The left ventricle has normal systolic function, with an ejection fraction of 55-60%. The cavity size was normal. There is no increase in left ventricular wall thickness. Left ventricular diastolic parameters were normal. Normal left ventricular filling pressures No evidence of left ventricular regional wall motion  abnormalities..  EEG  04/15/2019 This study is suggestive of cortical dysfunction in the left hemisphere secondary to underlying stroke as well as mild diffuse encephalopathy. No seizures or definite epileptiform discharges were seen throughout the recording.  TCD w/ bubble 04/18/2019  No HTIS heard during rest. No HITS heard during valsalva.  TEE 04/19/2019 LVEF 55-60% No LA/LAA thrombus or mass No ASD or PFO by color flow Doppler or saline microcavitation study. Mild AR, mild MR.   PHYSICAL EXAM  Frail middle aged african [place] male not in distress. . Afebrile. Head is nontraumatic. Neck is supple without bruit.    Cardiac exam no murmur or gallop. Lungs are clear to auscultation. Distal pulses are well felt. Neurological Exam ; - awake, alert, eyes open, following most of the simple commands. Hypophonia, able to have some words spoken out.  Dysconjugate eyes, left eye CN III palsy with pupil sparing, right eye CN VI palsy, PERRL. Not blinking to visual threat on the right. Right facial droop, tongue protrusion not cooperative, against gravity of LUE but significant ataxia, able to follow simple commands with left hand. lifts left leg slightly off of the bed on command, follow simple commands of the left foot. On pain stimulation, slight withdraw of RLE but no movement of RUE. Increased muscle tone on the RUE. Positive triple reflex on the right. Sensation and gait not tested.   ASSESSMENT/PLAN Mr. Legacy Lacivita is a 53 y.o. male with no significant past medical history presenting with right-sided weakness, decreased sensation on the right, diplopia and headache. Complete revascularization of occluded L PCA w/ mechanical thrombectomy.  Stroke: embolic multifocal posterior circulation infarcts with left P2 occlusion s/p IR with TICI3 reperfusion - likely embolic   Code Stroke CT head No acute abnormality. Old infarct L frontal lobe.   CTA head & neck L P2 occlusion. Mild neck  atherosclerosis . No LVO anterior circulation. Mild mid BA narrowing.  CT perfusion ischemia w/o visible core  CT Head - 04/16/19 - Areas of acute infarction are showing low-density and swelling but no macroscopic hemorrhage by CT.    Cerebral angio TICI3 reperfusion of occluded L PCA  MRI  L PCA (including thalamus), L>R cerebellum, R pontine and left midbrain infarct. Petechial hemorrhage seen in pons. Old L frontal infarcts.   LE Doppler no DVT - no evidence of deep vein thrombosis   2D Echo - EF 55-60%.  No cardiac source of emboli identified.   TCD bubble no HITS  TEE neg  UDS - pos for benzos - UDS done on 9/1  EEG - cortical dysfunction but no seizures.  LDL 88  HgbA1c 12.3  Lovenox 40 mg sq daily  for VTE prophylaxis  No antithrombotic prior to admission, finished DAPT 3-week course, now on aspirin alone   Therapy recommendations:  CIR -> D/t lack of post hospitalization family support - will pursue SNF - will need rapid covid testing prior to D/C  Medically ready for d/c when bed found  Disposition:  pending (SW trying to help family w/  placement, MA MCD [facility]  Patient remains stable  Possible seizure  Full body rigid shaking hours post IR, L>R  Loaded with keppra  EEG - cortical dysfunction but no seizures.  Continue keppra 500mg  bid  Hypertension  No hx of HTN, on no home meds  Placed On cleviprex gtt for BP control post IR, now off . On amlodipine 10 . Now on lotensin to 10mg  bid  . BP100-150s . BP goal normotensive  Hyperlipidemia  Home meds:  No statin  LDL 88, goal < 70  HDL 39  On lipitor 20  Continue statin at discharge  Diabetes type II, new diagnosis, Uncontrolled Hyperglycemia, improving  HgbA1c 12.3, goal < 7.0  CBGs  continue metformin 1000mg  bid (off glipizde to 5 mg bid d/t hypoglycemia)  DM coordinator consulted - should glucoses remain elevated. May benefit from SGLT-2inhibitior if he gets  insurance   SSI - on 0-9 scale  Glucoses 170-200s   Likely hypercoagulable state associated with uncontrolled DM  Dysphagia Malnutrition  . Secondary to stroke . Speech on board . On D2 nectar thick . Encourage po intake . Off IVF, lab stable . On glucerna shakes tid  UTI with fever, resolved  UA showed WBC > 50  Urine culture enterobactor and staph coag neg  Sensitive to cipro  Finished cipro 500mg  bid course (ends 9/8)  Fever resolved   Other Stroke Risk Factors   hx of stroke - small remote appearing infarcts in the left frontal lobe by CT and MRI  Other Active Problems  Microcytic anemia - iron 38, Ferritin 115 - put on iron tab bid.  Pt found on floor 04/23/19 by nursing staff. No obvious injury, complaints of pain, or neurologic changes.   Hospital day # 32   Patient remains neurologically he is stable to be transferred to skilled nursing facility but no bed available yet.  Discussed with case manager and his physical and occupational therapist..     A total of 25 minutes was spent for the care of this patient, spent on counseling patient and family on different diagnostic and therapeutic options, counseling and coordination of care, riskd ans benefits of management, compliance, or risk factor reduction and education.  Delia HeadyPramod , MD    To contact Stroke Continuity provider, please refer to [email or web address]. After hours, contact General Neurology

## 2019-05-16 NOTE — Progress Notes (Signed)
Occupational Therapy Treatment Patient Details Name: Rodney Collier MRN: [id number] DOB: 11/14/1965 Today's Date: 05/16/2019    History of present illness Pt is a 53 y.o. M with no known PMH who presents with right sided weakness, decreased sensation on right, diplopia and headache. CT showing remote L frontal infarcts, CTA head/neck with L P2 occlusion, MRI with acute L PCA infarct including much of L thalamus, L > R cerebellum, R pons. Now s/p complete revascularization of occluded L PCA with mechanical thrombectomy. ETT 8/27-8/28; self extubated 8/28.   OT comments  Pt seen with PT.  Pt able to initiate elbow Rt elbow extension, knee and hip extension with facilitation.  He appears to have ideomotor apraxia, as well as Rt inattention.  He is difficult to engage fully in activity - it's difficult to determine if this is due to communication and cognitive deficits or if due to possible depression as pt at times appears very apathetic.  He requires mod A for bed mobility, and min - mod A +2 for functional transfers.     Follow Up Recommendations  SNF    Equipment Recommendations  None recommended by OT    Recommendations for Other Services      Precautions / Restrictions Precautions Precautions: Fall Precaution Comments: R hemiparesis and inattention. R shoulder subluxation. Restrictions Weight Bearing Restrictions: No       Mobility Bed Mobility Overal bed mobility: Needs Assistance Bed Mobility: Supine to Sit     Supine to sit: Mod assist;+2 for safety/equipment     General bed mobility comments: OT supporting RLE - pt able to demonstrate some muscular engagement to bring LE towards EOB but required assist to complete. Able to demonstrate a larger movement on the L but apraxic and required assist to get LLE moving in right direction. Up to mod assist for trunk flexion into sitting.   Transfers Overall transfer level: Needs assistance Equipment used: 2 person hand held  assist Transfers: Sit to/from [employer] Sit to Stand: Min assist;+2 physical assistance Stand pivot transfers: Mod assist;+2 physical assistance       General transfer comment: Sit<>stand x2 with SPT at end of session to chair. With standing activity EOB pt demonstrated quad activation to achieve knee extension with assist, and was able to weight shift with minimal facilitation from therapists. Pt stood ~15 minutes total.     Balance Overall balance assessment: Needs assistance Sitting-balance support: Single extremity supported;Feet supported Sitting balance-Leahy Scale: Fair Sitting balance - Comments: pt able to maintain static balance with close minguard assist, mostly reliant on LUE support. sitting balance improved with time. Postural control: Right lateral lean;Posterior lean Standing balance support: Single extremity supported Standing balance-Leahy Scale: Poor Standing balance comment: able to maintain static balance with support and gross min assist                           ADL either performed or assessed with clinical judgement   ADL Overall ADL's : Needs assistance/impaired                         Toilet Transfer: Moderate assistance;+2 for safety/equipment;Stand-pivot;BSC           Functional mobility during ADLs: Moderate assistance;Maximal assistance;+2 for physical assistance;+2 for safety/equipment       Vision   Additional Comments: Pt appears to have Rt inattention    Perception     Praxis  Cognition Arousal/Alertness: Awake/alert Behavior During Therapy: Flat affect Overall Cognitive Status: Difficult to assess Area of Impairment: Attention;Safety/judgement;Problem solving                 Orientation Level: Place;Time;Situation;Disoriented to Current Attention Level: Sustained Memory: Decreased recall of precautions Following Commands: Follows one step commands with increased time;Follows  multi-step commands inconsistently Safety/Judgement: Decreased awareness of safety;Decreased awareness of deficits Awareness: Emergent Problem Solving: Difficulty sequencing;Requires verbal cues;Requires tactile cues General Comments: Pt following most commands; flat affect overall. Pt verbalized the best when being asked about his children. At end of session pt more withdrawn and not attemtping to answer questions as eagerly.         Exercises General Exercises - Upper Extremity Elbow Flexion: AAROM;Right;5 reps;Supine Elbow Extension: AAROM;Right;5 reps;Supine   Shoulder Instructions       General Comments Pt difficult to engage in activity.  he will attempt to talk, but often his voice is low and/or he doesn't make sense     Pertinent Vitals/ Pain       Pain Assessment: Faces Faces Pain Scale: Hurts a little bit Pain Location: Rt UE  Pain Descriptors / Indicators: Grimacing Pain Intervention(s): Limited activity within patient's tolerance;Monitored during session;Repositioned  Home Living                                          Prior Functioning/Environment              Frequency  Min 2X/week        Progress Toward Goals  OT Goals(current goals can now be found in the care plan section)  Progress towards OT goals: Progressing toward goals  Acute Rehab OT Goals Patient Stated Goal: unable  Plan Discharge plan remains appropriate    Co-evaluation    PT/OT/SLP Co-Evaluation/Treatment: Yes Reason for Co-Treatment: Complexity of the patient's impairments (multi-system involvement);Necessary to address cognition/behavior during functional activity;For patient/therapist safety;To address functional/ADL transfers PT goals addressed during session: Mobility/safety with mobility;Balance;Strengthening/ROM        AM-PAC OT "6 Clicks" Daily Activity     Outcome Measure   Help from another person eating meals?: A Lot Help from another person  taking care of personal grooming?: A Lot Help from another person toileting, which includes using toliet, bedpan, or urinal?: A Lot Help from another person bathing (including washing, rinsing, drying)?: A Lot Help from another person to put on and taking off regular upper body clothing?: A Lot Help from another person to put on and taking off regular lower body clothing?: A Lot 6 Click Score: 12    End of Session Equipment Utilized During Treatment: Gait belt  OT Visit Diagnosis: Unsteadiness on feet (R26.81);Hemiplegia and hemiparesis Hemiplegia - Right/Left: Right Hemiplegia - dominant/non-dominant: Dominant Hemiplegia - caused by: Cerebral infarction   Activity Tolerance Patient tolerated treatment well   Patient Left in chair;with call bell/phone within reach;with chair alarm set   Nurse Communication Mobility status        Time: [id number] OT Time Calculation (min): 46 min  Charges: OT General Charges $OT Visit: 1 Visit OT Treatments $Neuromuscular Re-education: 23-37 mins  Jeani Hawking, OTR/L Acute Rehabilitation Services Pager [phone] Office [phone]    Jeani Hawking M 05/16/2019, 3:56 PM

## 2019-05-17 LAB — GLUCOSE, CAPILLARY
Glucose-Capillary: 154 mg/dL — ABNORMAL HIGH (ref 70–99)
Glucose-Capillary: 207 mg/dL — ABNORMAL HIGH (ref 70–99)
Glucose-Capillary: 104 mg/dL — ABNORMAL HIGH (ref 70–99)
Glucose-Capillary: 119 mg/dL — ABNORMAL HIGH (ref 70–99)

## 2019-05-17 NOTE — Progress Notes (Signed)
STROKE TEAM PROGRESS NOTE   INTERVAL HISTORY Pt sitting in bed.  Remains hypophonic. No neuro changes.  He fell out of bed y`day but no injuries. He is incompetent to make decisions for himself at this time.  Vitals:   05/16/19 2357 05/17/19 0443 05/17/19 0753 05/17/19 1210  BP: 115/83 (!) 115/95 130/82 (!) 124/92  Pulse: (!) 55 88 84 92  Resp: 18  20 20   Temp: 98.7 F (37.1 C) 98.2 F (36.8 C) 98.2 F (36.8 C) 98.9 F (37.2 C)  TempSrc: Oral Oral Oral Oral  SpO2: 98% 100% 99% 98%  Weight:      Height:        CBC:  Recent Labs  Lab 05/12/19 0317 05/13/19 0348  WBC 5.9 5.4  HGB 13.2 13.6  HCT 42.2 43.1  MCV 72.9* 73.3*  PLT 330 355    Basic Metabolic Panel:  Recent Labs  Lab 05/12/19 0317 05/13/19 0348  NA 137 138  K 4.0 4.0  CL 101 102  CO2 23 24  GLUCOSE 203* 147*  BUN 12 12  CREATININE 0.74 0.72  CALCIUM 9.5 9.9    IMAGING CT Head WO Contrast 04/16/2019 Areas of acute infarction are showing low-density and swelling but no macroscopic hemorrhage by CT. As shown by previous MRI, there is involvement of the cerebellum, left cerebral peduncle and thalamus and left posteromedial temporal lobe and occipital lobe. Tiny focus of infarction in the right occipital lobe as well.  [name] 04/15/2019 1. Acute infarction throughout the left PCA territory (including most of the left thalamus), left more than right cerebellum, and right pons. Petechial hemorrhage is seen at the level of the pons.  2. Prior left frontal infarcts and microvascular ischemic gliosis.   Cerebral angiogram 04/14/2019 4 vessel cerebral arteriogram followed by complete revascularization of occluded Lt PCA  With x 2 passes with 22mm x 23mm embotrap retriever  device achieving a TICI 3 revascularization.  Ct Code Stroke Cta Head W/wo Contrast Ct Code Stroke Cta Neck W/wo Contrast Ct Code Stroke Cta Cerebral Perfusion W/wo Contrast 04/14/2019 1. Left P2 occlusion with downstream  reconstitution. Qualitative assessment of CT perfusion maps shows ischemia without visible core infarct in the left occipital lobe.  2. Mild atherosclerosis in the neck without flow limiting stenosis or embolic source seen.  3. No large vessel occlusion in the anterior circulation.  4. Mild mid basilar narrowing.   Ct Head Code Stroke Wo Contrast 04/14/2019 1. No acute finding.  2. Small remote appearing infarcts in the left frontal lobe.    LE Doppler 04/15/2019 Right: There is no evidence of deep vein thrombosis in the lower extremity. No cystic structure found in the popliteal fossa. Left: There is no evidence of deep vein thrombosis in the lower extremity. No cystic structure found in the popliteal fossa.  2D Echocardiogram 04/15/2019  1. The left ventricle has normal systolic function, with an ejection fraction of 55-60%. The cavity size was normal. Left ventricular diastolic parameters were normal. No evidence of left ventricular regional wall motion abnormalities.  2. The right ventricle has normal systolic function. The cavity was normal. There is no increase in right ventricular wall thickness.  3. The mitral valve is abnormal. Mild thickening of the mitral valve leaflet. The MR jet is posteriorly-directed.  4. The tricuspid valve is grossly normal.  5. The aortic valve is tricuspid. No stenosis of the aortic valve.  6. The aorta is normal unless otherwise noted.  7.  The inferior vena cava was normal in size with <50% respiratory variability. SUMMARY LVEF 55-60%, normal wall thickness, normal wall motion, normal diastolic function, mild posteriorly directed MR, normal size IVC that does not collapse, no PFO by color doppler  FINDINGS  Left Ventricle: The left ventricle has normal systolic function, with an ejection fraction of 55-60%. The cavity size was normal. There is no increase in left ventricular wall thickness. Left ventricular diastolic parameters were normal. Normal left  ventricular filling pressures No evidence of left ventricular regional wall motion abnormalities..  EEG  04/15/2019 This study is suggestive of cortical dysfunction in the left hemisphere secondary to underlying stroke as well as mild diffuse encephalopathy. No seizures or definite epileptiform discharges were seen throughout the recording.  TCD w/ bubble 04/18/2019  No HTIS heard during rest. No HITS heard during valsalva.  TEE 04/19/2019 LVEF 55-60% No LA/LAA thrombus or mass No ASD or PFO by color flow Doppler or saline microcavitation study. Mild AR, mild MR.   PHYSICAL EXAM  Frail middle aged african [place] male not in distress. . Afebrile. Head is nontraumatic. Neck is supple without bruit.    Cardiac exam no murmur or gallop. Lungs are clear to auscultation. Distal pulses are well felt. Neurological Exam ; - awake, alert, eyes open, following most of the simple commands. Hypophonia, able to have some words spoken out.  Follows only1 step commands. Dysconjugate eyes, left eye CN III palsy with pupil sparing, right eye CN VI palsy, PERRL. Not blinking to visual threat on the right. Right facial droop, tongue protrusion not cooperative, against gravity of LUE but significant ataxia, able to follow simple commands with left hand. lifts left leg slightly off of the bed on command, follow simple commands of the left foot. On pain stimulation, slight withdraw of RLE but no movement of RUE. Increased muscle tone on the RUE. Positive triple reflex on the right. Sensation and gait not tested.   ASSESSMENT/PLAN Rodney Collier is a 53 y.o. male with no significant past medical history presenting with right-sided weakness, decreased sensation on the right, diplopia and headache. Complete revascularization of occluded L PCA w/ mechanical thrombectomy.  Stroke: embolic multifocal posterior circulation infarcts with left P2 occlusion s/p IR with TICI3 reperfusion - likely embolic   Code Stroke CT  head No acute abnormality. Old infarct L frontal lobe.   CTA head & neck L P2 occlusion. Mild neck atherosclerosis . No LVO anterior circulation. Mild mid BA narrowing.  CT perfusion ischemia w/o visible core  CT Head - 04/16/19 - Areas of acute infarction are showing low-density and swelling but no macroscopic hemorrhage by CT.    Cerebral angio TICI3 reperfusion of occluded L PCA  MRI  L PCA (including thalamus), L>R cerebellum, R pontine and left midbrain infarct. Petechial hemorrhage seen in pons. Old L frontal infarcts.   LE Doppler no DVT - no evidence of deep vein thrombosis   2D Echo - EF 55-60%.  No cardiac source of emboli identified.   TCD bubble no HITS  TEE neg  UDS - pos for benzos - UDS done on 9/1  EEG - cortical dysfunction but no seizures.  LDL 88  HgbA1c 12.3  Lovenox 40 mg sq daily  for VTE prophylaxis  No antithrombotic prior to admission, finished DAPT 3-week course, now on aspirin alone   Therapy recommendations:  CIR -> D/t lack of post hospitalization family support - will pursue SNF - will need rapid covid testing prior to  D/C  Medically ready for d/c when bed found  Disposition:  pending (SW trying to help family w/ placement, MA MCD and transfer back to [place])  Patient remains stable  Possible seizure  Full body rigid shaking hours post IR, L>R  Loaded with keppra  EEG - cortical dysfunction but no seizures.  Continue keppra 500mg  bid  Hypertension  No hx of HTN, on no home meds  Placed On cleviprex gtt for BP control post IR, now off . On amlodipine 10 . Now on lotensin to 10mg  bid  . BP100-150s . BP goal normotensive  Hyperlipidemia  Home meds:  No statin  LDL 88, goal < 70  HDL 39  On lipitor 20  Continue statin at discharge  Diabetes type II, new diagnosis, Uncontrolled Hyperglycemia, improving  HgbA1c 12.3, goal < 7.0  CBGs  continue metformin 1000mg  bid (off glipizde to 5 mg bid d/t hypoglycemia)  DM  coordinator consulted - should glucoses remain elevated. May benefit from SGLT-2inhibitior if he gets insurance   SSI - on 0-9 scale  Glucoses 170-200s   Likely hypercoagulable state associated with uncontrolled DM  Dysphagia Malnutrition  . Secondary to stroke . Speech on board . On D2 nectar thick . Encourage po intake . Off IVF, lab stable . On glucerna shakes tid  UTI with fever, resolved  UA showed WBC > 50  Urine culture enterobactor and staph coag neg  Sensitive to cipro  Finished cipro 500mg  bid course (ends 9/8)  Fever resolved   Other Stroke Risk Factors   hx of stroke - small remote appearing infarcts in the left frontal lobe by CT and MRI  Other Active Problems  Microcytic anemia - iron 38, Ferritin 115 - put on iron tab bid.  Pt found on floor 04/23/19 by nursing staff. No obvious injury, complaints of pain, or neurologic changes.   Hospital day # 33   Patient remains neurologically he is stable to be transferred to skilled nursing facility but no bed available yet. Continue present treatment     , MD    To contact Stroke Continuity provider, please refer to 11/8. After hours, contact General Neurology

## 2019-05-17 NOTE — Progress Notes (Signed)
Nutrition Follow-up  DOCUMENTATION CODES:   Not applicable  INTERVENTION:  Continue Glucerna Shake po TID, each supplement provides 220 kcal and 10 grams of protein.  Encourage adequate PO intake.   NUTRITION DIAGNOSIS:   Inadequate oral intake related to inability to eat as evidenced by NPO status; diet advanced; improved  GOAL:   Patient will meet greater than or equal to 90% of their needs; progressing  MONITOR:   PO intake, Supplement acceptance, Diet advancement, Skin, Weight trends, Labs, I & O's  REASON FOR ASSESSMENT:   Ventilator    ASSESSMENT:   Rodney Collier is a 53 y.o. male with no significant past medical history presenting with right-sided weakness, decreased sensation on the right, diplopia and headache. Complete revascularization of occluded L PCA w/ mechanical thrombectomy. Self extubated 8/28.TEE9/1which showed no PFO/clot or cardiac source of aneurysm.  Pt currently on a dysphagia 2 diet with nectar thick liquids. Meal completion has been 50-100%. Pt currently has Glucerna shake ordered and has been consuming them. RD to continue with current orders to aid in caloric and protein needs. SNF pending. Labs and medications reviewed.   Diet Order:   Diet Order            DIET DYS 2 Room service appropriate? No; Fluid consistency: Nectar Thick  Diet effective now              EDUCATION NEEDS:   Not appropriate for education at this time  Skin:  Skin Assessment: Reviewed RN Assessment Skin Integrity Issues:: Other (Comment) Other: N/A  Last BM:  9/27  Height:   Ht Readings from Last 1 Encounters:  04/19/19 6' (1.829 m)    Weight:   Wt Readings from Last 1 Encounters:  04/19/19 77 kg    Ideal Body Weight:  80.9 kg  BMI:  Body mass index is 23.02 kg/m.  Estimated Nutritional Needs:   Kcal:  2000-2200  Protein:  100-115 grams  Fluid:  > 2.0 L    [name], MS, RD, LDN Pager # [phone] After hours/ weekend pager  # [phone]

## 2019-05-18 LAB — GLUCOSE, CAPILLARY
Glucose-Capillary: 152 mg/dL — ABNORMAL HIGH (ref 70–99)
Glucose-Capillary: 158 mg/dL — ABNORMAL HIGH (ref 70–99)
Glucose-Capillary: 191 mg/dL — ABNORMAL HIGH (ref 70–99)
Glucose-Capillary: 151 mg/dL — ABNORMAL HIGH (ref 70–99)

## 2019-05-18 NOTE — Progress Notes (Signed)
STROKE TEAM PROGRESS NOTE   INTERVAL HISTORY Pt lying comfortably in bed.  Remains hypophonic. No neuro changes.  Vitals are stable  Vitals:   05/18/19 0012 05/18/19 0336 05/18/19 0829 05/18/19 1224  BP: (!) 111/48 120/78 110/76 115/80  Pulse: 85 86 78 96  Resp: 17 17 16 16   Temp: 98.2 F (36.8 C) 98.4 F (36.9 C) 98.1 F (36.7 C) 98.3 F (36.8 C)  TempSrc: Oral Oral Axillary Oral  SpO2: 99% 100% 99% 100%  Weight:      Height:        CBC:  Recent Labs  Lab 05/12/19 0317 05/13/19 0348  WBC 5.9 5.4  HGB 13.2 13.6  HCT 42.2 43.1  MCV 72.9* 73.3*  PLT 330 340    Basic Metabolic Panel:  Recent Labs  Lab 05/12/19 0317 05/13/19 0348  NA 137 138  K 4.0 4.0  CL 101 102  CO2 23 24  GLUCOSE 203* 147*  BUN 12 12  CREATININE 0.74 0.72  CALCIUM 9.5 9.9    IMAGING CT Head WO Contrast 04/16/2019 Areas of acute infarction are showing low-density and swelling but no macroscopic hemorrhage by CT. As shown by previous MRI, there is involvement of the cerebellum, left cerebral peduncle and thalamus and left posteromedial temporal lobe and occipital lobe. Tiny focus of infarction in the right occipital lobe as well.  [name] 04/15/2019 1. Acute infarction throughout the left PCA territory (including most of the left thalamus), left more than right cerebellum, and right pons. Petechial hemorrhage is seen at the level of the pons.  2. Prior left frontal infarcts and microvascular ischemic gliosis.   Cerebral angiogram 04/14/2019 4 vessel cerebral arteriogram followed by complete revascularization of occluded Lt PCA  With x 2 passes with 5mm x 33mm embotrap retriever  device achieving a TICI 3 revascularization.  Ct Code Stroke Cta Head W/wo Contrast Ct Code Stroke Cta Neck W/wo Contrast Ct Code Stroke Cta Cerebral Perfusion W/wo Contrast 04/14/2019 1. Left P2 occlusion with downstream reconstitution. Qualitative assessment of CT perfusion maps shows ischemia  without visible core infarct in the left occipital lobe.  2. Mild atherosclerosis in the neck without flow limiting stenosis or embolic source seen.  3. No large vessel occlusion in the anterior circulation.  4. Mild mid basilar narrowing.   Ct Head Code Stroke Wo Contrast 04/14/2019 1. No acute finding.  2. Small remote appearing infarcts in the left frontal lobe.    LE Doppler 04/15/2019 Right: There is no evidence of deep vein thrombosis in the lower extremity. No cystic structure found in the popliteal fossa. Left: There is no evidence of deep vein thrombosis in the lower extremity. No cystic structure found in the popliteal fossa.  2D Echocardiogram 04/15/2019  1. The left ventricle has normal systolic function, with an ejection fraction of 55-60%. The cavity size was normal. Left ventricular diastolic parameters were normal. No evidence of left ventricular regional wall motion abnormalities.  2. The right ventricle has normal systolic function. The cavity was normal. There is no increase in right ventricular wall thickness.  3. The mitral valve is abnormal. Mild thickening of the mitral valve leaflet. The MR jet is posteriorly-directed.  4. The tricuspid valve is grossly normal.  5. The aortic valve is tricuspid. No stenosis of the aortic valve.  6. The aorta is normal unless otherwise noted.  7. The inferior vena cava was normal in size with <50% respiratory variability. SUMMARY LVEF 55-60%, normal wall thickness,  normal wall motion, normal diastolic function, mild posteriorly directed MR, normal size IVC that does not collapse, no PFO by color doppler  FINDINGS  Left Ventricle: The left ventricle has normal systolic function, with an ejection fraction of 55-60%. The cavity size was normal. There is no increase in left ventricular wall thickness. Left ventricular diastolic parameters were normal. Normal left ventricular filling pressures No evidence of left ventricular regional wall  motion abnormalities..  EEG  04/15/2019 This study is suggestive of cortical dysfunction in the left hemisphere secondary to underlying stroke as well as mild diffuse encephalopathy. No seizures or definite epileptiform discharges were seen throughout the recording.  TCD w/ bubble 04/18/2019  No HTIS heard during rest. No HITS heard during valsalva.  TEE 04/19/2019 LVEF 55-60% No LA/LAA thrombus or mass No ASD or PFO by color flow Doppler or saline microcavitation study. Mild AR, mild MR.   PHYSICAL EXAM  Frail middle aged african [place] male not in distress. . Afebrile. Head is nontraumatic. Neck is supple without bruit.    Cardiac exam no murmur or gallop. Lungs are clear to auscultation. Distal pulses are well felt. Neurological Exam ; - awake, alert, eyes open, following most of the simple commands. Hypophonia, able to have some words spoken out.  Follows only1 step commands. Dysconjugate eyes, left eye CN III palsy with pupil sparing, right eye CN VI palsy, PERRL. Not blinking to visual threat on the right. Right facial droop, tongue protrusion not cooperative, against gravity of LUE but significant ataxia, able to follow simple commands with left hand. lifts left leg slightly off of the bed on command, follow simple commands of the left foot. On pain stimulation, slight withdraw of RLE but no movement of RUE. Increased muscle tone on the RUE. Positive triple reflex on the right. Sensation and gait not tested.   ASSESSMENT/PLAN [name] with no significant past medical history presenting with right-sided weakness, decreased sensation on the right, diplopia and headache. Complete revascularization of occluded L PCA w/ mechanical thrombectomy.  Stroke: embolic multifocal posterior circulation infarcts with left P2 occlusion s/p IR with TICI3 reperfusion - likely embolic   Code Stroke CT head No acute abnormality. Old infarct L frontal lobe.   CTA head & neck  L P2 occlusion. Mild neck atherosclerosis . No LVO anterior circulation. Mild mid BA narrowing.  CT perfusion ischemia w/o visible core  CT Head - 04/16/19 - Areas of acute infarction are showing low-density and swelling but no macroscopic hemorrhage by CT.    Cerebral angio TICI3 reperfusion of occluded L PCA  MRI  L PCA (including thalamus), L>R cerebellum, R pontine and left midbrain infarct. Petechial hemorrhage seen in pons. Old L frontal infarcts.   LE Doppler no DVT - no evidence of deep vein thrombosis   2D Echo - EF 55-60%.  No cardiac source of emboli identified.   TCD bubble no HITS  TEE neg  UDS - pos for benzos - UDS done on 9/1  EEG - cortical dysfunction but no seizures.  LDL 88  HgbA1c 12.3  Lovenox 40 mg sq daily  for VTE prophylaxis  No antithrombotic prior to admission, finished DAPT 3-week course, now on aspirin alone   Therapy recommendations:  CIR -> D/t lack of post hospitalization family support - will pursue SNF - will need rapid covid testing prior to D/C  Medically ready for d/c when bed found  Disposition:  pending (SW trying to help family  w/ placement, MA MCD and transfer back to [facility])  Patient remains stable  Possible seizure  Full body rigid shaking hours post IR, L>R  Loaded with keppra  EEG - cortical dysfunction but no seizures.  Continue keppra 500mg  bid  Hypertension  No hx of HTN, on no home meds  Placed On cleviprex gtt for BP control post IR, now off . On amlodipine 10 . Now on lotensin to 10mg  bid  . BP100-150s . BP goal normotensive  Hyperlipidemia  Home meds:  No statin  LDL 88, goal < 70  HDL 39  On lipitor 20  Continue statin at discharge  Diabetes type II, new diagnosis, Uncontrolled Hyperglycemia, improving  HgbA1c 12.3, goal < 7.0  CBGs  continue metformin 1000mg  bid (off glipizde to 5 mg bid d/t hypoglycemia)  DM coordinator consulted - should glucoses remain elevated. May benefit from  SGLT-2inhibitior if he gets insurance   SSI - on 0-9 scale  Glucoses 170-200s   Likely hypercoagulable state associated with uncontrolled DM  Dysphagia Malnutrition  . Secondary to stroke . Speech on board . On D2 nectar thick . Encourage po intake . Off IVF, lab stable . On glucerna shakes tid  UTI with fever, resolved  UA showed WBC > 50  Urine culture enterobactor and staph coag neg  Sensitive to cipro  Finished cipro 500mg  bid course (ends 9/8)  Fever resolved   Other Stroke Risk Factors   hx of stroke - small remote appearing infarcts in the left frontal lobe by CT and MRI  Other Active Problems  Microcytic anemia - iron 38, Ferritin 115 - put on iron tab bid.  Pt found on floor 04/23/19 by nursing staff. No obvious injury, complaints of pain, or neurologic changes.   Hospital day # 34   Patient remains neurologically he is stable to be transferred to skilled nursing facility but no bed available yet. Continue present treatment     , MD    To contact Stroke Continuity provider, please refer to . After hours, contact General Neurology

## 2019-05-18 NOTE — Progress Notes (Signed)
  Speech Language Pathology Treatment: Cognitive-Linquistic  Patient Details Name: Rodney Collier MRN: [id number] DOB: Mar 21, 1966 Today's Date: 05/18/2019 Time: 1550-1610 SLP Time Calculation (min) (ACUTE ONLY): 20 min  Assessment / Plan / Recommendation Clinical Impression  Skilled treatment session focused on communication goals. SLP recieved pt almost across bed in [place] created by having head and feet of bed elevated. Pt attempting to reposition himself but unable to overcome positioning. Pt able to follow 1 step directions with hand positioning to pull himself up in bed. Pt responded to SLP's questions with ecolalic responses and didn't respond with any yes/no to questions. SLP presented PO snack but pt pulled covers up to chin, was pleasant but didn't indicate that he wanted any. Pt left semi-reclined in bed with all needs within reach.    HPI HPI: Pt is a 53 y.o. M with no known PMH who presents with right sided weakness, decreased sensation on right, diplopia and headache. CT showing remote L frontal infarcts, CTA head/neck with L P2 occlusion, MRI with acute L PCA infarct including much of L thalamus, L > R cerebellum, R pons. Now s/p complete revascularization of occluded L PCA with mechanical thrombectomy. ETT 8/27-8/28; self extubated 8/28.      SLP Plan  Continue with current plan of care       Recommendations   SNF                Follow up Recommendations: 24 hour supervision/assistance;Skilled Nursing facility SLP Visit Diagnosis: Aphasia (R47.01);Apraxia (R48.2) Plan: Continue with current plan of care       [place] 05/18/2019, 4:38 PM

## 2019-05-18 NOTE — Progress Notes (Signed)
Physical Therapy Treatment Patient Details Name: Rodney Collier MRN: [id number] DOB: 1966-02-28 Today's Date: 05/18/2019    History of Present Illness Pt is a 53 y.o. M with no known PMH who presents with right sided weakness, decreased sensation on right, diplopia and headache. CT showing remote L frontal infarcts, CTA head/neck with L P2 occlusion, MRI with acute L PCA infarct including much of L thalamus, L > R cerebellum, R pons. Now s/p complete revascularization of occluded L PCA with mechanical thrombectomy. ETT 8/27-8/28; self extubated 8/28.    PT Comments    Focus of session was wheelchair mobility training. By end of session pt was able to self-propel himself with  LUE/LLE and assist for steering from therapist. Pt required occasional assist for hand placement on rim of wheel to propel instead of using spokes of wheel. Pt seemed to enjoy being in the wheelchair and being able to control his movement. Will continue to follow and progress as able per POC.    Follow Up Recommendations  CIR;Supervision/Assistance - 24 hour     Equipment Recommendations  Wheelchair (measurements PT);Wheelchair cushion (measurements PT);3in1 (PT);Hospital bed    Recommendations for Other Services Rehab consult     Precautions / Restrictions Precautions Precautions: Fall Precaution Comments: R hemiparesis and inattention. R shoulder subluxation. Restrictions Weight Bearing Restrictions: No    Mobility  Bed Mobility Overal bed mobility: Needs Assistance Bed Mobility: Supine to Sit;Sit to Supine     Supine to sit: Mod assist Sit to supine: Mod assist;+2 for physical assistance   General bed mobility comments: With supported bridging, pt was able to scoot himself up to [facility] after return to supine.   Transfers Overall transfer level: Needs assistance Equipment used: 2 person hand held assist Transfers: Sit to/from [facility] Sit to Stand: Min assist;+2 physical  assistance Stand pivot transfers: Mod assist;+2 physical assistance       General transfer comment: Transition to/from wheelchair at his L side.   Ambulation/Gait                 Theme park manager mobility: Yes Wheelchair propulsion: Left upper extremity;Left lower extremity Wheelchair parts: Needs assistance Distance: 300 Wheelchair Assistance Details (indicate cue type and reason): Assist for L hand placement on rail of wheel to push. Towards end of training pt was able to maintain without assistance. At about 150' pt was able to use LLE to assist with self-propel. Coordination decreased at times but overall did well to advance wheelchair forward. Assist to steer - increased assist towards end as pt fatigued.   Modified Rankin (Stroke Patients Only) Modified Rankin (Stroke Patients Only) Pre-Morbid Rankin Score: No symptoms Modified Rankin: Moderately severe disability     Balance Overall balance assessment: Needs assistance Sitting-balance support: Single extremity supported;Feet supported Sitting balance-Leahy Scale: Fair Sitting balance - Comments: pt able to maintain static balance with close minguard assist, mostly reliant on LUE support. sitting balance improved with time.  Postural control: Right lateral lean;Posterior lean                                  Cognition Arousal/Alertness: Awake/alert Behavior During Therapy: Flat affect Overall Cognitive Status: Difficult to assess Area of Impairment: Attention;Safety/judgement;Problem solving                 Orientation Level: Place;Time;Situation;Disoriented to  Current Attention Level: Sustained Memory: Decreased recall of precautions Following Commands: Follows one step commands with increased time;Follows multi-step commands inconsistently Safety/Judgement: Decreased awareness of safety;Decreased awareness of deficits Awareness:  Emergent Problem Solving: Difficulty sequencing;Requires verbal cues;Requires tactile cues General Comments: Pt with strong voice upon entry to room - answering questions quickly and appropriately. At end of session as pt fatigued, he required increased time to answer questions and voice quality was decreased.       Exercises General Exercises - Upper Extremity Elbow Flexion: AAROM;Right;5 reps;Supine Elbow Extension: AAROM;Right;5 reps;Supine Shoulder Exercises Shoulder External Rotation: PROM;Right;5 reps;Supine Low Level/ICU Exercises Stabilized Bridging: (Functional to scoot to [facility]) Other Exercises Other Exercises: prolonged stretch to R elbow, pt tending to maintain elbow in flexed position    General Comments        Pertinent Vitals/Pain Pain Assessment: Faces Faces Pain Scale: Hurts whole lot Pain Location: Unsure - possibly abdomen. Pt was unable to tell me exactly where he was hurting but appeared very painful at end of session after transition back to bed. ?cramp? Pain Descriptors / Indicators: Grimacing Pain Intervention(s): Limited activity within patient's tolerance;Monitored during session;Repositioned    Home Living                      Prior Function            PT Goals (current goals can now be found in the care plan section) Acute Rehab PT Goals Patient Stated Goal: unable PT Goal Formulation: Patient unable to participate in goal setting Time For Goal Achievement: 05/16/19 Potential to Achieve Goals: Good Progress towards PT goals: Progressing toward goals    Frequency    Min 3X/week      PT Plan Current plan remains appropriate    Co-evaluation              AM-PAC PT "6 Clicks" Mobility   Outcome Measure  Help needed turning from your back to your side while in a flat bed without using bedrails?: A Lot Help needed moving from lying on your back to sitting on the side of a flat bed without using bedrails?: A Lot Help needed  moving to and from a bed to a chair (including a wheelchair)?: A Lot Help needed standing up from a chair using your arms (e.g., wheelchair or bedside chair)?: A Lot Help needed to walk in hospital room?: Total Help needed climbing 3-5 steps with a railing? : Total 6 Click Score: 10    End of Session Equipment Utilized During Treatment: Gait belt Activity Tolerance: Patient tolerated treatment well Patient left: in chair;with chair alarm set;with call bell/phone within reach Nurse Communication: Mobility status;Need for lift equipment PT Visit Diagnosis: Unsteadiness on feet (R26.81);Muscle weakness (generalized) (M62.81);Difficulty in walking, not elsewhere classified (R26.2);Hemiplegia and hemiparesis Hemiplegia - Right/Left: Right Hemiplegia - dominant/non-dominant: Non-dominant Hemiplegia - caused by: Cerebral infarction     Time: 1345-1430 PT Time Calculation (min) (ACUTE ONLY): 45 min  Charges:  $Wheel Chair Management: 38-52 mins                     Conni Slipper, PT, DPT Acute Rehabilitation Services Pager: [phone] Office: [phone]    Marylynn Pearson 05/18/2019, 3:40 PM

## 2019-05-19 LAB — GLUCOSE, CAPILLARY
Glucose-Capillary: 151 mg/dL — ABNORMAL HIGH (ref 70–99)
Glucose-Capillary: 204 mg/dL — ABNORMAL HIGH (ref 70–99)
Glucose-Capillary: 164 mg/dL — ABNORMAL HIGH (ref 70–99)

## 2019-05-19 NOTE — Progress Notes (Signed)
STROKE TEAM PROGRESS NOTE   INTERVAL HISTORY Pt sitting up  in bed.   . No neuro changes.  Vitals are stable. No SNF bed yet  Vitals:   05/18/19 2339 05/19/19 0406 05/19/19 0810 05/19/19 1143  BP: 112/80 117/77 131/83 97/64  Pulse: 84 95 84 (!) 117  Resp: 17 18 16 19   Temp: 98.5 F (36.9 C) 97.6 F (36.4 C) 98.3 F (36.8 C) 98 F (36.7 C)  TempSrc: Oral Oral Oral Oral  SpO2: 100% 100% 98% 98%  Weight:      Height:        CBC:  Recent Labs  Lab 05/13/19 0348  WBC 5.4  HGB 13.6  HCT 43.1  MCV 73.3*  PLT 340    Basic Metabolic Panel:  Recent Labs  Lab 05/13/19 0348  NA 138  K 4.0  CL 102  CO2 24  GLUCOSE 147*  BUN 12  CREATININE 0.72  CALCIUM 9.9    IMAGING CT Head WO Contrast 04/16/2019 Areas of acute infarction are showing low-density and swelling but no macroscopic hemorrhage by CT. As shown by previous MRI, there is involvement of the cerebellum, left cerebral peduncle and thalamus and left posteromedial temporal lobe and occipital lobe. Tiny focus of infarction in the right occipital lobe as well.  [name] 04/15/2019 1. Acute infarction throughout the left PCA territory (including most of the left thalamus), left more than right cerebellum, and right pons. Petechial hemorrhage is seen at the level of the pons.  2. Prior left frontal infarcts and microvascular ischemic gliosis.   Cerebral angiogram 04/14/2019 4 vessel cerebral arteriogram followed by complete revascularization of occluded Lt PCA  With x 2 passes with 5mm x 33mm embotrap retriever  device achieving a TICI 3 revascularization.  Ct Code Stroke Cta Head W/wo Contrast Ct Code Stroke Cta Neck W/wo Contrast Ct Code Stroke Cta Cerebral Perfusion W/wo Contrast 04/14/2019 1. Left P2 occlusion with downstream reconstitution. Qualitative assessment of CT perfusion maps shows ischemia without visible core infarct in the left occipital lobe.  2. Mild atherosclerosis in the neck without  flow limiting stenosis or embolic source seen.  3. No large vessel occlusion in the anterior circulation.  4. Mild mid basilar narrowing.   Ct Head Code Stroke Wo Contrast 04/14/2019 1. No acute finding.  2. Small remote appearing infarcts in the left frontal lobe.    LE Doppler 04/15/2019 Right: There is no evidence of deep vein thrombosis in the lower extremity. No cystic structure found in the popliteal fossa. Left: There is no evidence of deep vein thrombosis in the lower extremity. No cystic structure found in the popliteal fossa.  2D Echocardiogram 04/15/2019  1. The left ventricle has normal systolic function, with an ejection fraction of 55-60%. The cavity size was normal. Left ventricular diastolic parameters were normal. No evidence of left ventricular regional wall motion abnormalities.  2. The right ventricle has normal systolic function. The cavity was normal. There is no increase in right ventricular wall thickness.  3. The mitral valve is abnormal. Mild thickening of the mitral valve leaflet. The MR jet is posteriorly-directed.  4. The tricuspid valve is grossly normal.  5. The aortic valve is tricuspid. No stenosis of the aortic valve.  6. The aorta is normal unless otherwise noted.  7. The inferior vena cava was normal in size with <50% respiratory variability. SUMMARY LVEF 55-60%, normal wall thickness, normal wall motion, normal diastolic function, mild posteriorly directed MR, normal size  IVC that does not collapse, no PFO by color doppler  FINDINGS  Left Ventricle: The left ventricle has normal systolic function, with an ejection fraction of 55-60%. The cavity size was normal. There is no increase in left ventricular wall thickness. Left ventricular diastolic parameters were normal. Normal left ventricular filling pressures No evidence of left ventricular regional wall motion abnormalities..  EEG  04/15/2019 This study is suggestive of cortical dysfunction in the left  hemisphere secondary to underlying stroke as well as mild diffuse encephalopathy. No seizures or definite epileptiform discharges were seen throughout the recording.  TCD w/ bubble 04/18/2019  No HTIS heard during rest. No HITS heard during valsalva.  TEE 04/19/2019 LVEF 55-60% No LA/LAA thrombus or mass No ASD or PFO by color flow Doppler or saline microcavitation study. Mild AR, mild MR.   PHYSICAL EXAM  Frail middle aged african [place] male not in distress. . Afebrile. Head is nontraumatic. Neck is supple without bruit.    Cardiac exam no murmur or gallop. Lungs are clear to auscultation. Distal pulses are well felt. Neurological Exam ; - awake, alert, eyes open, following most of the simple commands. Hypophonia, able to have some words spoken out.  Follows only1 step commands. Dysconjugate eyes, left eye CN III palsy with pupil sparing, right eye CN VI palsy, PERRL. Not blinking to visual threat on the right. Right facial droop, tongue protrusion not cooperative, against gravity of LUE but significant ataxia, able to follow simple commands with left hand. lifts left leg slightly off of the bed on command, follow simple commands of the left foot. On pain stimulation, slight withdraw of RLE but no movement of RUE. Increased muscle tone on the RUE. Positive triple reflex on the right. Sensation and gait not tested.   ASSESSMENT/PLAN [name] with no significant past medical history presenting with right-sided weakness, decreased sensation on the right, diplopia and headache. Complete revascularization of occluded L PCA w/ mechanical thrombectomy.  Stroke: embolic multifocal posterior circulation infarcts with left P2 occlusion s/p IR with TICI3 reperfusion - likely embolic   Code Stroke CT head No acute abnormality. Old infarct L frontal lobe.   CTA head & neck L P2 occlusion. Mild neck atherosclerosis . No LVO anterior circulation. Mild mid BA narrowing.  CT  perfusion ischemia w/o visible core  CT Head - 04/16/19 - Areas of acute infarction are showing low-density and swelling but no macroscopic hemorrhage by CT.    Cerebral angio TICI3 reperfusion of occluded L PCA  MRI  L PCA (including thalamus), L>R cerebellum, R pontine and left midbrain infarct. Petechial hemorrhage seen in pons. Old L frontal infarcts.   LE Doppler no DVT - no evidence of deep vein thrombosis   2D Echo - EF 55-60%.  No cardiac source of emboli identified.   TCD bubble no HITS  TEE neg  UDS - pos for benzos - UDS done on 9/1  EEG - cortical dysfunction but no seizures.  LDL 88  HgbA1c 12.3  Lovenox 40 mg sq daily  for VTE prophylaxis  No antithrombotic prior to admission, finished DAPT 3-week course, now on aspirin alone   Therapy recommendations:  CIR -> D/t lack of post hospitalization family support - will pursue SNF - will need rapid covid testing prior to D/C  Medically ready for d/c when bed found  Disposition:  pending (SW trying to help family w/ placement, MA MCD and transfer back to [place])  Patient remains  stable  Possible seizure  Full body rigid shaking hours post IR, L>R  Loaded with keppra  EEG - cortical dysfunction but no seizures.  Continue keppra 500mg  bid  Hypertension  No hx of HTN, on no home meds  Placed On cleviprex gtt for BP control post IR, now off . On amlodipine 10 . Now on lotensin to 10mg  bid  . BP100-150s . BP goal normotensive  Hyperlipidemia  Home meds:  No statin  LDL 88, goal < 70  HDL 39  On lipitor 20  Continue statin at discharge  Diabetes type II, new diagnosis, Uncontrolled Hyperglycemia, improving  HgbA1c 12.3, goal < 7.0  CBGs  continue metformin 1000mg  bid (off glipizde to 5 mg bid d/t hypoglycemia)  DM coordinator consulted - should glucoses remain elevated. May benefit from SGLT-2inhibitior if he gets insurance   SSI - on 0-9 scale  Glucoses 170-200s   Likely  hypercoagulable state associated with uncontrolled DM  Dysphagia Malnutrition  . Secondary to stroke . Speech on board . On D2 nectar thick . Encourage po intake . Off IVF, lab stable . On glucerna shakes tid  UTI with fever, resolved  UA showed WBC > 50  Urine culture enterobactor and staph coag neg  Sensitive to cipro  Finished cipro 500mg  bid course (ends 9/8)  Fever resolved   Other Stroke Risk Factors   hx of stroke - small remote appearing infarcts in the left frontal lobe by CT and MRI  Other Active Problems  Microcytic anemia - iron 38, Ferritin 115 - put on iron tab bid.  Pt found on floor 04/23/19 by nursing staff. No obvious injury, complaints of pain, or neurologic changes.   Hospital day # 35   Patient remains neurologically he is stable to be transferred to skilled nursing facility but no bed available yet. Continue present treatment     , MD    To contact Stroke Continuity provider, please refer to . After hours, contact General Neurology

## 2019-05-20 LAB — GLUCOSE, CAPILLARY
Glucose-Capillary: 141 mg/dL — ABNORMAL HIGH (ref 70–99)
Glucose-Capillary: 181 mg/dL — ABNORMAL HIGH (ref 70–99)
Glucose-Capillary: 267 mg/dL — ABNORMAL HIGH (ref 70–99)
Glucose-Capillary: 185 mg/dL — ABNORMAL HIGH (ref 70–99)

## 2019-05-20 NOTE — Progress Notes (Signed)
Orthopedic Tech Progress Note Patient Details:  Rodney Collier 12-27-1965 [id number] Patient only need knee immobilizer for when he is up with therapy. Ortho Devices Type of Ortho Device: Knee Immobilizer Ortho Device/Splint Interventions: Other (comment)   Post Interventions Patient Tolerated: Other (comment) Instructions Provided: Other (comment)   Janit Pagan 05/20/2019, 2:11 PM

## 2019-05-20 NOTE — Progress Notes (Signed)
STROKE TEAM PROGRESS NOTE   INTERVAL HISTORY Pt sitting up  in bed.   . No neuro changes.  Vitals are stable. No SNF bed yet  Vitals:   05/19/19 2346 05/20/19 0407 05/20/19 0831 05/20/19 1139  BP: 112/73 98/74 111/82 117/82  Pulse: 91 85 87 (!) 101  Resp: 17 18 17 16   Temp: 98.5 F (36.9 C) 98.1 F (36.7 C) 98.6 F (37 C) 98.1 F (36.7 C)  TempSrc: Oral Oral Oral Oral  SpO2: 98% 98% 98% 100%  Weight:      Height:        CBC:  No results for input(s): WBC, NEUTROABS, HGB, HCT, MCV, PLT in the last 168 hours.  Basic Metabolic Panel:  No results for input(s): NA, K, CL, CO2, GLUCOSE, BUN, CREATININE, CALCIUM, MG, PHOS in the last 168 hours.  IMAGING CT Head WO Contrast 04/16/2019 Areas of acute infarction are showing low-density and swelling but no macroscopic hemorrhage by CT. As shown by previous MRI, there is involvement of the cerebellum, left cerebral peduncle and thalamus and left posteromedial temporal lobe and occipital lobe. Tiny focus of infarction in the right occipital lobe as well.  [name] 04/15/2019 1. Acute infarction throughout the left PCA territory (including most of the left thalamus), left more than right cerebellum, and right pons. Petechial hemorrhage is seen at the level of the pons.  2. Prior left frontal infarcts and microvascular ischemic gliosis.   Cerebral angiogram 04/14/2019 4 vessel cerebral arteriogram followed by complete revascularization of occluded Lt PCA  With x 2 passes with 39mm x 65mm embotrap retriever  device achieving a TICI 3 revascularization.  Ct Code Stroke Cta Head W/wo Contrast Ct Code Stroke Cta Neck W/wo Contrast Ct Code Stroke Cta Cerebral Perfusion W/wo Contrast 04/14/2019 1. Left P2 occlusion with downstream reconstitution. Qualitative assessment of CT perfusion maps shows ischemia without visible core infarct in the left occipital lobe.  2. Mild atherosclerosis in the neck without flow limiting stenosis or  embolic source seen.  3. No large vessel occlusion in the anterior circulation.  4. Mild mid basilar narrowing.   Ct Head Code Stroke Wo Contrast 04/14/2019 1. No acute finding.  2. Small remote appearing infarcts in the left frontal lobe.    LE Doppler 04/15/2019 Right: There is no evidence of deep vein thrombosis in the lower extremity. No cystic structure found in the popliteal fossa. Left: There is no evidence of deep vein thrombosis in the lower extremity. No cystic structure found in the popliteal fossa.  2D Echocardiogram 04/15/2019  1. The left ventricle has normal systolic function, with an ejection fraction of 55-60%. The cavity size was normal. Left ventricular diastolic parameters were normal. No evidence of left ventricular regional wall motion abnormalities.  2. The right ventricle has normal systolic function. The cavity was normal. There is no increase in right ventricular wall thickness.  3. The mitral valve is abnormal. Mild thickening of the mitral valve leaflet. The MR jet is posteriorly-directed.  4. The tricuspid valve is grossly normal.  5. The aortic valve is tricuspid. No stenosis of the aortic valve.  6. The aorta is normal unless otherwise noted.  7. The inferior vena cava was normal in size with <50% respiratory variability. SUMMARY LVEF 55-60%, normal wall thickness, normal wall motion, normal diastolic function, mild posteriorly directed MR, normal size IVC that does not collapse, no PFO by color doppler  FINDINGS  Left Ventricle: The left ventricle has normal systolic  function, with an ejection fraction of 55-60%. The cavity size was normal. There is no increase in left ventricular wall thickness. Left ventricular diastolic parameters were normal. Normal left ventricular filling pressures No evidence of left ventricular regional wall motion abnormalities..  EEG  04/15/2019 This study is suggestive of cortical dysfunction in the left hemisphere secondary to  underlying stroke as well as mild diffuse encephalopathy. No seizures or definite epileptiform discharges were seen throughout the recording.  TCD w/ bubble 04/18/2019  No HTIS heard during rest. No HITS heard during valsalva.  TEE 04/19/2019 LVEF 55-60% No LA/LAA thrombus or mass No ASD or PFO by color flow Doppler or saline microcavitation study. Mild AR, mild MR.   PHYSICAL EXAM  Frail middle aged african [place] male not in distress. . Afebrile. Head is nontraumatic. Neck is supple without bruit.    Cardiac exam no murmur or gallop. Lungs are clear to auscultation. Distal pulses are well felt. Neurological Exam ; - awake, alert, eyes open, following most of the simple commands. Hypophonia, able to have some words spoken out.  Follows only1 step commands. Dysconjugate eyes, left eye CN III palsy with pupil sparing, right eye CN VI palsy, PERRL. Not blinking to visual threat on the right. Right facial droop, tongue protrusion not cooperative, against gravity of LUE but significant ataxia, able to follow simple commands with left hand. lifts left leg slightly off of the bed on command, follow simple commands of the left foot. On pain stimulation, slight withdraw of RLE but no movement of RUE. Increased muscle tone on the RUE. Positive triple reflex on the right. Sensation and gait not tested.   ASSESSMENT/PLAN [name] with no significant past medical history presenting with right-sided weakness, decreased sensation on the right, diplopia and headache. Complete revascularization of occluded L PCA w/ mechanical thrombectomy.  Stroke: embolic multifocal posterior circulation infarcts with left P2 occlusion s/p IR with TICI3 reperfusion - likely embolic   Code Stroke CT head No acute abnormality. Old infarct L frontal lobe.   CTA head & neck L P2 occlusion. Mild neck atherosclerosis . No LVO anterior circulation. Mild mid BA narrowing.  CT perfusion ischemia w/o  visible core  CT Head - 04/16/19 - Areas of acute infarction are showing low-density and swelling but no macroscopic hemorrhage by CT.    Cerebral angio TICI3 reperfusion of occluded L PCA  MRI  L PCA (including thalamus), L>R cerebellum, R pontine and left midbrain infarct. Petechial hemorrhage seen in pons. Old L frontal infarcts.   LE Doppler no DVT - no evidence of deep vein thrombosis   2D Echo - EF 55-60%.  No cardiac source of emboli identified.   TCD bubble no HITS  TEE neg  UDS - pos for benzos - UDS done on 9/1  EEG - cortical dysfunction but no seizures.  LDL 88  HgbA1c 12.3  Lovenox 40 mg sq daily  for VTE prophylaxis  No antithrombotic prior to admission, finished DAPT 3-week course, now on aspirin alone   Therapy recommendations:  CIR -> D/t lack of post hospitalization family support - will pursue SNF - will need rapid covid testing prior to D/C  Medically ready for d/c when bed found  Disposition:  pending (SW trying to help family w/ placement, [place] MCD and transfer back to [place])  Patient remains stable  Possible seizure  Full body rigid shaking hours post IR, L>R  Loaded with keppra  EEG - cortical  dysfunction but no seizures.  Continue keppra 500mg  bid  Hypertension  No hx of HTN, on no home meds  Placed On cleviprex gtt for BP control post IR, now off . On amlodipine 10 . Now on lotensin to 10mg  bid  . BP100-150s . BP goal normotensive  Hyperlipidemia  Home meds:  No statin  LDL 88, goal < 70  HDL 39  On lipitor 20  Continue statin at discharge  Diabetes type II, new diagnosis, Uncontrolled Hyperglycemia, improving  HgbA1c 12.3, goal < 7.0  CBGs  continue metformin 1000mg  bid (off glipizde to 5 mg bid d/t hypoglycemia)  DM coordinator consulted - should glucoses remain elevated. May benefit from SGLT-2inhibitior if he gets insurance   SSI - on 0-9 scale  Glucoses 170-200s   Likely hypercoagulable state associated  with uncontrolled DM  Dysphagia Malnutrition  . Secondary to stroke . Speech on board . On D2 nectar thick . Encourage po intake . Off IVF, lab stable . On glucerna shakes tid  UTI with fever, resolved  UA showed WBC > 50  Urine culture enterobactor and staph coag neg  Sensitive to cipro  Finished cipro 500mg  bid course (ends 9/8)  Fever resolved   Other Stroke Risk Factors   hx of stroke - small remote appearing infarcts in the left frontal lobe by CT and MRI  Other Active Problems  Microcytic anemia - iron 38, Ferritin 115 - put on iron tab bid.  Pt found on floor 04/23/19 by nursing staff. No obvious injury, complaints of pain, or neurologic changes.   Hospital day # 36   Yet await SNF bed. Continue present treatment     , MD    To contact Stroke Continuity provider, please refer to . After hours, contact General Neurology

## 2019-05-20 NOTE — Progress Notes (Signed)
Physical Therapy Treatment Patient Details Name: Rodney Collier MRN: [id number] DOB: [birth year] Today's Date: 05/20/2019    History of Present Illness Pt is a 53 y.o. M with no known PMH who presents with right sided weakness, decreased sensation on right, diplopia and headache. CT showing remote L frontal infarcts, CTA head/neck with L P2 occlusion, MRI with acute L PCA infarct including much of L thalamus, L > R cerebellum, R pons. Now s/p complete revascularization of occluded L PCA with mechanical thrombectomy. ETT 8/27-8/28; self extubated 8/28.    PT Comments    Goals updated based on progress.  Pt was able to transfer to [facility] and recliner chair today and work in the hallway with the rail on sit to stand transitions. I attempted to get him to start stepping on his left and he would not try it (he had a hard time expressing why, but possibly did not feel stable despite therapist blocking out his right leg).  I would like to continue efforts of pre gait and lateral transfers to a WC.  PT will continue to follow acutely for safe mobility progression  Follow Up Recommendations  Supervision/Assistance - 24 hour;SNF(rehab denied)     Equipment Recommendations  Wheelchair (measurements PT);Wheelchair cushion (measurements PT);3in1 (PT);Hospital bed    Recommendations for Other Services Rehab consult     Precautions / Restrictions Precautions Precautions: Fall Precaution Comments: R hemiparesis and inattention. R shoulder subluxation.    Mobility  Bed Mobility Overal bed mobility: Needs Assistance Bed Mobility: Supine to Sit     Supine to sit: Mod assist     General bed mobility comments: Mod assist to support trunk and provide hand held assist to pull up to sitting EOB.  Assist to progress right leg to EOB.   Transfers Overall transfer level: Needs assistance Equipment used: None Transfers: Sit to/from [employer] Sit to Stand: Mod assist;+2 physical  assistance Stand pivot transfers: Mod assist;+2 physical assistance       General transfer comment: Mod assist +1 to get to [facility], second person needed for [facility] to recliner chair mainly to stabilize the BSC from tipping during transfer.        Modified Rankin (Stroke Patients Only) Modified Rankin (Stroke Patients Only) Pre-Morbid Rankin Score: No symptoms Modified Rankin: Severe disability     Balance Overall balance assessment: Needs assistance Sitting-balance support: Feet supported;Single extremity supported Sitting balance-Leahy Scale: Poor Sitting balance - Comments: reliant on L UE to stabilize against foot board for balance.  Postural control: Right lateral lean;Posterior lean Standing balance support: Single extremity supported Standing balance-Leahy Scale: Poor Standing balance comment: dependent on external support. I took pt out to the hallway and utilized the hallway rail on his left for stability to practice repeated sit to stands.  I attempted to get him to start pre gait stepping forward with his left leg and the rail while I stabilized his right knee, but he would not do it for some reason that he was trying to communicate to me, but could not.                              Cognition Arousal/Alertness: Awake/alert Behavior During Therapy: Flat affect;Impulsive Overall Cognitive Status: Difficult to assess Area of Impairment: Safety/judgement;Awareness                   Current Attention Level: Selective     Safety/Judgement: Decreased awareness of safety;Decreased awareness  of deficits Awareness: Emergent Problem Solving: Difficulty sequencing;Requires verbal cues;Requires tactile cues General Comments: Pt remains impulsive, frustrated by his communication issues, agreeable to treatment.          General Comments General comments (skin integrity, edema, etc.): Hand over hand assist to self feed at the end of the session.        Pertinent  Vitals/Pain Pain Assessment: No/denies pain Faces Pain Scale: Hurts little more Pain Location: generalized Pain Intervention(s): Patient requesting pain meds-RN notified       Prior Function            PT Goals (current goals can now be found in the care plan section) Acute Rehab PT Goals Patient Stated Goal: unable PT Goal Formulation: Patient unable to participate in goal setting Time For Goal Achievement: 06/03/19 Potential to Achieve Goals: Good Progress towards PT goals: Progressing toward goals    Frequency    Min 3X/week      PT Plan Current plan remains appropriate       AM-PAC PT "6 Clicks" Mobility   Outcome Measure  Help needed turning from your back to your side while in a flat bed without using bedrails?: A Lot Help needed moving from lying on your back to sitting on the side of a flat bed without using bedrails?: A Lot Help needed moving to and from a bed to a chair (including a wheelchair)?: A Lot Help needed standing up from a chair using your arms (e.g., wheelchair or bedside chair)?: A Lot Help needed to walk in hospital room?: Total Help needed climbing 3-5 steps with a railing? : Total 6 Click Score: 10    End of Session Equipment Utilized During Treatment: Gait belt Activity Tolerance: Patient tolerated treatment well Patient left: in chair;with call bell/phone within reach;with chair alarm set Nurse Communication: Mobility status PT Visit Diagnosis: Unsteadiness on feet (R26.81);Muscle weakness (generalized) (M62.81);Difficulty in walking, not elsewhere classified (R26.2);Hemiplegia and hemiparesis Hemiplegia - Right/Left: Right Hemiplegia - dominant/non-dominant: Non-dominant Hemiplegia - caused by: Cerebral infarction     Time: 5809-9833 PT Time Calculation (min) (ACUTE ONLY): 43 min  Charges:  $Therapeutic Activity: 38-52 mins                    Darlen Gledhill B. Idamay Hosein, PT, DPT  Acute Rehabilitation [phone] pager [phone] office  @ [facility]: [phone]   05/20/2019, 1:13 PM

## 2019-05-20 NOTE — Progress Notes (Signed)
  Speech Language Pathology Treatment: Cognitive-Linquistic;Dysphagia  Patient Details Name: Rodney Collier MRN: [id number] DOB: [birth year] Today's Date: 05/20/2019 Time: 1010-1027 SLP Time Calculation (min) (ACUTE ONLY): 17 min  Assessment / Plan / Recommendation Clinical Impression  Pt was seen for skilled ST targeting cognitive and dysphagia goals.  Therapist facilitated the session with a functional snack of dys 2 textures and nectar thick liquids to assess toleration of currently prescribed diet.  Pt consumed current diet with no overt s/s of aspiration and minimal pocketing; however, presentations were limited as pt declined more than a couple of bites of sips of snack.  Breakfast tray was also present and looked almost untouched.  Recommend that pt remain on currently prescribed diet with trials of advanced consistencies with SLP to continue working towards diet progression in the hopes of liberalizing diet and increasing PO intake.  During structured confrontational and responsive naming tasks pt needed mod cues to recognize and correct verbal errors (phonemic paraphasias, perseveration, echolalia).  Conversational language appeared to improve slightly when giving therapist biographical information (pt reports he speaks [place] and is from [place]).  Pt was left in bed with bed alarm set and all needs within reach.  Continue per current plan of care.    HPI HPI: Pt is a 53 y.o. M with no known PMH who presents with right sided weakness, decreased sensation on right, diplopia and headache. CT showing remote L frontal infarcts, CTA head/neck with L P2 occlusion, MRI with acute L PCA infarct including much of L thalamus, L > R cerebellum, R pons. Now s/p complete revascularization of occluded L PCA with mechanical thrombectomy. ETT 8/27-8/28; self extubated 8/28.      SLP Plan  Continue with current plan of care       Recommendations  Diet recommendations: Dysphagia 2 (fine chop);Nectar-thick  liquid Liquids provided via: Cup Medication Administration: Crushed with puree Supervision: Patient able to self feed Compensations: Minimize environmental distractions;Small sips/bites;Slow rate;Lingual sweep for clearance of pocketing                Oral Care Recommendations: Oral care BID Follow up Recommendations: 24 hour supervision/assistance;Skilled Nursing facility SLP Visit Diagnosis: Aphasia (R47.01);Apraxia (R48.2);Dysphagia, oropharyngeal phase (R13.12) Plan: Continue with current plan of care       GO                [name], Selinda Orion 05/20/2019, 11:03 AM

## 2019-05-21 LAB — COMPREHENSIVE METABOLIC PANEL
ALT: 56 U/L — ABNORMAL HIGH (ref 0–44)
AST: 38 U/L (ref 15–41)
Albumin: 3.3 g/dL — ABNORMAL LOW (ref 3.5–5.0)
Alkaline Phosphatase: 63 U/L (ref 38–126)
Anion gap: 10 (ref 5–15)
BUN: 11 mg/dL (ref 6–20)
CO2: 24 mmol/L (ref 22–32)
Calcium: 9.6 mg/dL (ref 8.9–10.3)
Chloride: 102 mmol/L (ref 98–111)
Creatinine, Ser: 0.79 mg/dL (ref 0.61–1.24)
GFR calc Af Amer: >60 mL/min (ref >60)
GFR calc non Af Amer: >60 mL/min (ref >60)
Glucose, Bld: 163 mg/dL — ABNORMAL HIGH (ref 70–99)
Potassium: 4.3 mmol/L (ref 3.5–5.1)
Sodium: 136 mmol/L (ref 135–145)
Total Bilirubin: 0.6 mg/dL (ref 0.3–1.2)
Total Protein: 6.5 g/dL (ref 6.5–8.1)

## 2019-05-21 LAB — GLUCOSE, CAPILLARY
Glucose-Capillary: 186 mg/dL — ABNORMAL HIGH (ref 70–99)
Glucose-Capillary: 189 mg/dL — ABNORMAL HIGH (ref 70–99)
Glucose-Capillary: 259 mg/dL — ABNORMAL HIGH (ref 70–99)

## 2019-05-21 LAB — CBC
HCT: 40 % (ref 39.0–52.0)
Hemoglobin: 12.3 g/dL — ABNORMAL LOW (ref 13.0–17.0)
MCH: 22.5 pg — ABNORMAL LOW (ref 26.0–34.0)
MCHC: 30.8 g/dL (ref 30.0–36.0)
MCV: 73.3 fL — ABNORMAL LOW (ref 80.0–100.0)
Platelets: 210 10*3/uL (ref 150–400)
RBC: 5.46 MIL/uL (ref 4.22–5.81)
RDW: 14.2 % (ref 11.5–15.5)
WBC: 5.5 10*3/uL (ref 4.0–10.5)
nRBC: 0 % (ref 0.0–0.2)

## 2019-05-21 NOTE — Progress Notes (Signed)
STROKE TEAM PROGRESS NOTE   INTERVAL HISTORY Pt lying in bed. No neuro changes.  Vitals are stable. Labs are WNL. No SNF bed yet. Encourage self exercise in bed.   Vitals:   05/20/19 1702 05/20/19 1945 05/21/19 0104 05/21/19 0514  BP: 103/71 125/85 118/84 132/76  Pulse: (!) 101 (!) 101 89 95  Resp: 20  18 18   Temp: 98.1 F (36.7 C) 98.7 F (37.1 C) 98.1 F (36.7 C) (!) 97.5 F (36.4 C)  TempSrc: Oral Oral Axillary Oral  SpO2: 99% 100% 100% 100%  Weight:      Height:        CBC:  Recent Labs  Lab 05/21/19 0448  WBC 5.5  HGB 12.3*  HCT 40.0  MCV 73.3*  PLT 419    Basic Metabolic Panel:  Recent Labs  Lab 05/21/19 0448  NA 136  K 4.3  CL 102  CO2 24  GLUCOSE 163*  BUN 11  CREATININE 0.79  CALCIUM 9.6    IMAGING CT Head WO Contrast 04/16/2019 Areas of acute infarction are showing low-density and swelling but no macroscopic hemorrhage by CT. As shown by previous MRI, there is involvement of the cerebellum, left cerebral peduncle and thalamus and left posteromedial temporal lobe and occipital lobe. Tiny focus of infarction in the right occipital lobe as well.  [name] 04/15/2019 1. Acute infarction throughout the left PCA territory (including most of the left thalamus), left more than right cerebellum, and right pons. Petechial hemorrhage is seen at the level of the pons.  2. Prior left frontal infarcts and microvascular ischemic gliosis.   Cerebral angiogram 04/14/2019 4 vessel cerebral arteriogram followed by complete revascularization of occluded Lt PCA  With x 2 passes with 70mm x 59mm embotrap retriever  device achieving a TICI 3 revascularization.  Ct Code Stroke Cta Head W/wo Contrast Ct Code Stroke Cta Neck W/wo Contrast Ct Code Stroke Cta Cerebral Perfusion W/wo Contrast 04/14/2019 1. Left P2 occlusion with downstream reconstitution. Qualitative assessment of CT perfusion maps shows ischemia without visible core infarct in the left occipital  lobe.  2. Mild atherosclerosis in the neck without flow limiting stenosis or embolic source seen.  3. No large vessel occlusion in the anterior circulation.  4. Mild mid basilar narrowing.   Ct Head Code Stroke Wo Contrast 04/14/2019 1. No acute finding.  2. Small remote appearing infarcts in the left frontal lobe.    LE Doppler 04/15/2019 Right: There is no evidence of deep vein thrombosis in the lower extremity. No cystic structure found in the popliteal fossa. Left: There is no evidence of deep vein thrombosis in the lower extremity. No cystic structure found in the popliteal fossa.  2D Echocardiogram 04/15/2019  1. The left ventricle has normal systolic function, with an ejection fraction of 55-60%. The cavity size was normal. Left ventricular diastolic parameters were normal. No evidence of left ventricular regional wall motion abnormalities.  2. The right ventricle has normal systolic function. The cavity was normal. There is no increase in right ventricular wall thickness.  3. The mitral valve is abnormal. Mild thickening of the mitral valve leaflet. The MR jet is posteriorly-directed.  4. The tricuspid valve is grossly normal.  5. The aortic valve is tricuspid. No stenosis of the aortic valve.  6. The aorta is normal unless otherwise noted.  7. The inferior vena cava was normal in size with <50% respiratory variability. SUMMARY LVEF 55-60%, normal wall thickness, normal wall motion, normal diastolic function,  mild posteriorly directed MR, normal size IVC that does not collapse, no PFO by color doppler  FINDINGS  Left Ventricle: The left ventricle has normal systolic function, with an ejection fraction of 55-60%. The cavity size was normal. There is no increase in left ventricular wall thickness. Left ventricular diastolic parameters were normal. Normal left ventricular filling pressures No evidence of left ventricular regional wall motion abnormalities..  EEG  04/15/2019 This  study is suggestive of cortical dysfunction in the left hemisphere secondary to underlying stroke as well as mild diffuse encephalopathy. No seizures or definite epileptiform discharges were seen throughout the recording.  TCD w/ bubble 04/18/2019  No HTIS heard during rest. No HITS heard during valsalva.  TEE 04/19/2019 LVEF 55-60% No LA/LAA thrombus or mass No ASD or PFO by color flow Doppler or saline microcavitation study. Mild AR, mild MR.   PHYSICAL EXAM  Frail middle aged african [place] male not in distress. . Afebrile. Head is nontraumatic. Neck is supple without bruit.    Cardiac exam no murmur or gallop. Lungs are clear to auscultation. Distal pulses are well felt. Neurological Exam: awake, alert, eyes open, following most of the simple commands. Hypophonia improving, able to have some words spoken out.  Follows only 1 step commands. Dysconjugate eyes, left eye CN III palsy with pupil sparing, right eye CN VI palsy, PERRL. Not blinking to visual threat on the right. Right facial droop, tongue protrusion not cooperative, against gravity of LUE but significant ataxia, able to follow simple commands with left hand. lifts left leg slightly off of the bed on command, follow simple commands of the left foot. On pain stimulation, mild withdraw of RLE but no movement of RUE. Increased muscle tone on the RUE. Positive triple reflex on the right. Sensation and gait not tested.   ASSESSMENT/PLAN Mr. Rigo Letts is a 53 y.o. male with no significant past medical history presenting with right-sided weakness, decreased sensation on the right, diplopia and headache. Complete revascularization of occluded L PCA w/ mechanical thrombectomy.  Stroke: embolic multifocal posterior circulation infarcts with left P2 occlusion s/p IR with TICI3 reperfusion - likely embolic   Code Stroke CT head No acute abnormality. Old infarct L frontal lobe.   CTA head & neck L P2 occlusion. Mild neck atherosclerosis  . No LVO anterior circulation. Mild mid BA narrowing.  CT perfusion ischemia w/o visible core  CT Head - 04/16/19 - Areas of acute infarction are showing low-density and swelling but no macroscopic hemorrhage by CT.    Cerebral angio TICI3 reperfusion of occluded L PCA  MRI  L PCA (including thalamus), L>R cerebellum, R pontine and left midbrain infarct. Petechial hemorrhage seen in pons. Old L frontal infarcts.   LE Doppler no DVT - no evidence of deep vein thrombosis   2D Echo - EF 55-60%.  No cardiac source of emboli identified.   TCD bubble no HITS  TEE neg  UDS - pos for benzos - UDS done on 9/1  EEG - cortical dysfunction but no seizures.  LDL 88  HgbA1c 12.3  Lovenox 40 mg sq daily  for VTE prophylaxis  No antithrombotic prior to admission, finished DAPT 3-week course, now on aspirin alone   Therapy recommendations:  CIR -> D/t lack of post hospitalization family support - will pursue SNF - will need rapid covid testing prior to D/C  Medically ready for d/c when bed found  Disposition:  pending (SW trying to help family w/ placement, [place]  Patient remains stable  Possible seizure  Full body rigid shaking hours post IR, L>R  Loaded with keppra  EEG - cortical dysfunction but no seizures.  Continue keppra 500mg  bid  Hypertension  No hx of HTN, on no home meds  Placed On cleviprex gtt for BP control post IR, now off . On amlodipine 10 . Now on lotensin to 10mg  bid  . BP100-150s . BP goal normotensive  Hyperlipidemia  Home meds:  No statin  LDL 88, goal < 70  HDL 39  On lipitor 20  Continue statin at discharge  Diabetes type II, new diagnosis, Uncontrolled Hyperglycemia, improving  HgbA1c 12.3, goal < 7.0  CBGs  continue metformin 1000mg  bid (off glipizde to 5 mg bid d/t hypoglycemia)  DM coordinator consulted - should glucoses remain elevated. May benefit from SGLT-2inhibitior if he gets insurance   SSI  - on 0-9 scale  Glucoses 170-200s   Likely hypercoagulable state associated with uncontrolled DM  Dysphagia Malnutrition  . Secondary to stroke . Speech on board . On D2 nectar thick . Encourage po intake . Off IVF, lab stable . On glucerna shakes tid  UTI with fever, resolved  UA showed WBC > 50  Urine culture enterobactor and staph coag neg  Sensitive to cipro  Finished cipro 500mg  bid course (ends 9/8)  Fever resolved   Other Stroke Risk Factors   hx of stroke - small remote appearing infarcts in the left frontal lobe by CT and MRI  Other Active Problems  Microcytic anemia - iron 38, Ferritin 115 - put on iron tab bid.  Pt found on floor 04/23/19 by nursing staff. No obvious injury, complaints of pain, or neurologic changes.   Hospital day # 37   Marvel PlanJindong Carsten Carstarphen, MD PhD Stroke Neurology 05/21/2019 5:14 PM     To contact Stroke Continuity provider, please refer to [email or web address]. After hours, contact General Neurology

## 2019-05-22 LAB — GLUCOSE, CAPILLARY
Glucose-Capillary: 210 mg/dL — ABNORMAL HIGH (ref 70–99)
Glucose-Capillary: 203 mg/dL — ABNORMAL HIGH (ref 70–99)
Glucose-Capillary: 238 mg/dL — ABNORMAL HIGH (ref 70–99)

## 2019-05-22 MED ORDER — GLIPIZIDE 5 MG PO TABS
2.5000 mg | ORAL_TABLET | Freq: Every day | ORAL | Status: DC
  Administered 2019-05-23: 2.5 mg via ORAL
  Filled 2019-05-22: qty 1

## 2019-05-22 NOTE — Progress Notes (Signed)
STROKE TEAM PROGRESS NOTE   INTERVAL HISTORY Pt lying in bed. No neuro changes.  Vitals are stable. No SNF bed yet. Still largely right hemiplegia. Eye movement some improvement from before.   Vitals:   05/21/19 1545 05/21/19 2007 05/21/19 2359 05/22/19 0439  BP: 118/87 (!) 121/92 (!) 124/91 122/88  Pulse: 88 95 88 71  Resp: 16 17 17 17   Temp: 98.2 F (36.8 C) 98.6 F (37 C) 97.8 F (36.6 C) 97.8 F (36.6 C)  TempSrc: Oral Oral Oral Oral  SpO2: 100% 100% 100% 100%  Weight:      Height:        CBC:  Recent Labs  Lab 05/21/19 0448  WBC 5.5  HGB 12.3*  HCT 40.0  MCV 73.3*  PLT 638    Basic Metabolic Panel:  Recent Labs  Lab 05/21/19 0448  NA 136  K 4.3  CL 102  CO2 24  GLUCOSE 163*  BUN 11  CREATININE 0.79  CALCIUM 9.6    IMAGING CT Head WO Contrast 04/16/2019 Areas of acute infarction are showing low-density and swelling but no macroscopic hemorrhage by CT. As shown by previous MRI, there is involvement of the cerebellum, left cerebral peduncle and thalamus and left posteromedial temporal lobe and occipital lobe. Tiny focus of infarction in the right occipital lobe as well.  [name] 04/15/2019 1. Acute infarction throughout the left PCA territory (including most of the left thalamus), left more than right cerebellum, and right pons. Petechial hemorrhage is seen at the level of the pons.  2. Prior left frontal infarcts and microvascular ischemic gliosis.   Cerebral angiogram 04/14/2019 4 vessel cerebral arteriogram followed by complete revascularization of occluded Lt PCA  With x 2 passes with 30mm x 33mm embotrap retriever  device achieving a TICI 3 revascularization.  Ct Code Stroke Cta Head W/wo Contrast Ct Code Stroke Cta Neck W/wo Contrast Ct Code Stroke Cta Cerebral Perfusion W/wo Contrast 04/14/2019 1. Left P2 occlusion with downstream reconstitution. Qualitative assessment of CT perfusion maps shows ischemia without visible core infarct in  the left occipital lobe.  2. Mild atherosclerosis in the neck without flow limiting stenosis or embolic source seen.  3. No large vessel occlusion in the anterior circulation.  4. Mild mid basilar narrowing.   Ct Head Code Stroke Wo Contrast 04/14/2019 1. No acute finding.  2. Small remote appearing infarcts in the left frontal lobe.    LE Doppler 04/15/2019 Right: There is no evidence of deep vein thrombosis in the lower extremity. No cystic structure found in the popliteal fossa. Left: There is no evidence of deep vein thrombosis in the lower extremity. No cystic structure found in the popliteal fossa.  2D Echocardiogram 04/15/2019  1. The left ventricle has normal systolic function, with an ejection fraction of 55-60%. The cavity size was normal. Left ventricular diastolic parameters were normal. No evidence of left ventricular regional wall motion abnormalities.  2. The right ventricle has normal systolic function. The cavity was normal. There is no increase in right ventricular wall thickness.  3. The mitral valve is abnormal. Mild thickening of the mitral valve leaflet. The MR jet is posteriorly-directed.  4. The tricuspid valve is grossly normal.  5. The aortic valve is tricuspid. No stenosis of the aortic valve.  6. The aorta is normal unless otherwise noted.  7. The inferior vena cava was normal in size with <50% respiratory variability. SUMMARY LVEF 55-60%, normal wall thickness, normal wall motion, normal diastolic  function, mild posteriorly directed MR, normal size IVC that does not collapse, no PFO by color doppler  FINDINGS  Left Ventricle: The left ventricle has normal systolic function, with an ejection fraction of 55-60%. The cavity size was normal. There is no increase in left ventricular wall thickness. Left ventricular diastolic parameters were normal. Normal left ventricular filling pressures No evidence of left ventricular regional wall motion abnormalities..  EEG   04/15/2019 This study is suggestive of cortical dysfunction in the left hemisphere secondary to underlying stroke as well as mild diffuse encephalopathy. No seizures or definite epileptiform discharges were seen throughout the recording.  TCD w/ bubble 04/18/2019  No HTIS heard during rest. No HITS heard during valsalva.  TEE 04/19/2019 LVEF 55-60% No LA/LAA thrombus or mass No ASD or PFO by color flow Doppler or saline microcavitation study. Mild AR, mild MR.   PHYSICAL EXAM  Frail middle aged african [place] male not in distress. . Afebrile. Head is nontraumatic. Neck is supple without bruit.    Cardiac exam no murmur or gallop. Lungs are clear to auscultation. Distal pulses are well felt. Neurological Exam: awake, alert, eyes open, following simple commands. Hypophonia improving, able to have some words spoken out.  Slight dysconjugate eyes, left eye mild INO, right eye CN VI incomplete palsy, up and downward gaze palsy. PERRL. Not blinking to visual threat on the right. Right facial droop, tongue midline, LUE 5/5 but significant ataxia. LLE 4/5 at least. On pain stimulation, slight withdraw of RLE but no movement of RUE. Increased muscle tone on the RUE. Positive triple reflex on the right. Sensation and gait not tested.   ASSESSMENT/PLAN Mr. Kendle Erker is a 54 y.o. male with no significant past medical history presenting with right-sided weakness, decreased sensation on the right, diplopia and headache. Complete revascularization of occluded L PCA w/ mechanical thrombectomy.  Stroke: embolic multifocal posterior circulation infarcts with left P2 occlusion s/p IR with TICI3 reperfusion - likely embolic   Code Stroke CT head No acute abnormality. Old infarct L frontal lobe.   CTA head & neck L P2 occlusion. Mild neck atherosclerosis . No LVO anterior circulation. Mild mid BA narrowing.  CT perfusion ischemia w/o visible core  CT Head - 04/16/19 - Areas of acute infarction are  showing low-density and swelling but no macroscopic hemorrhage by CT.    Cerebral angio TICI3 reperfusion of occluded L PCA  MRI  L PCA (including thalamus), L>R cerebellum, R pontine and left midbrain infarct. Petechial hemorrhage seen in pons. Old L frontal infarcts.   LE Doppler no DVT - no evidence of deep vein thrombosis   2D Echo - EF 55-60%.  No cardiac source of emboli identified.   TCD bubble no HITS  TEE neg  UDS - pos for benzos - UDS done on 9/1  EEG - cortical dysfunction but no seizures.  LDL 88  HgbA1c 12.3  Lovenox 40 mg sq daily  for VTE prophylaxis  No antithrombotic prior to admission, finished DAPT 3-week course, now on aspirin alone   Therapy recommendations:  CIR -> D/t lack of post hospitalization family support - will pursue SNF - will need rapid covid testing prior to D/C  Medically ready for d/c when bed found  Disposition:  pending (SW trying to help family w/ placement, MA MCD and transfer back to [place])  Patient remains stable  Possible seizure  Full body rigid shaking hours post IR, L>R  Loaded with keppra  EEG - cortical dysfunction but  no seizures.  Continue keppra 500mg  bid  Hypertension  No hx of HTN, on no home meds  Placed On cleviprex gtt for BP control post IR, now off . On amlodipine 10 . Now on lotensin to 10mg  bid  . BP100-150s . BP goal normotensive  Hyperlipidemia  Home meds:  No statin  LDL 88, goal < 70  HDL 39  On lipitor 20  Continue statin at discharge  Diabetes type II, new diagnosis, Uncontrolled Hyperglycemia, improving  HgbA1c 12.3, goal < 7.0  CBGs  continue metformin 1000mg  bid   off glipizde to 5 mg bid d/t hypoglycemia  Now add glipizide 2.5mg  QAC  DM coordinator consulted - should glucoses remain elevated. May benefit from SGLT-2inhibitior if he gets insurance   SSI - on 0-9 scale  Glucoses 170-200s   Likely hypercoagulable state associated with uncontrolled  DM  Dysphagia Malnutrition  . Secondary to stroke . Speech on board . On D2 nectar thick . Encourage po intake . Off IVF, lab stable . On glucerna shakes tid  UTI with fever, resolved  UA showed WBC > 50  Urine culture enterobactor and staph coag neg  Sensitive to cipro  Finished cipro 500mg  bid course (ends 9/8)  Fever resolved   Other Stroke Risk Factors   hx of stroke - small remote appearing infarcts in the left frontal lobe by CT and MRI  Other Active Problems  Microcytic anemia - iron 38, Ferritin 115 - put on iron tab bid.  Pt found on floor 04/23/19 by nursing staff. No obvious injury, complaints of pain, or neurologic changes.   Hospital day # 8638   Marvel PlanJindong Azzam Mehra, MD PhD Stroke Neurology 05/22/2019 1:50 PM   To contact Stroke Continuity provider, please refer to [email or web address]. After hours, contact General Neurology

## 2019-05-23 DIAGNOSIS — I6312 Cerebral infarction due to embolism of basilar artery: Secondary | ICD-10-CM

## 2019-05-23 LAB — GLUCOSE, CAPILLARY
Glucose-Capillary: 123 mg/dL — ABNORMAL HIGH (ref 70–99)
Glucose-Capillary: 184 mg/dL — ABNORMAL HIGH (ref 70–99)
Glucose-Capillary: 217 mg/dL — ABNORMAL HIGH (ref 70–99)
Glucose-Capillary: 101 mg/dL — ABNORMAL HIGH (ref 70–99)
Glucose-Capillary: 143 mg/dL — ABNORMAL HIGH (ref 70–99)

## 2019-05-23 MED ORDER — INSULIN ASPART 100 UNIT/ML ~~LOC~~ SOLN
0.0000 [IU] | Freq: Three times a day (TID) | SUBCUTANEOUS | Status: DC
  Administered 2019-05-24 (×2): 2 [IU] via SUBCUTANEOUS
  Administered 2019-05-25: 3 [IU] via SUBCUTANEOUS
  Administered 2019-05-26 – 2019-05-30 (×8): 2 [IU] via SUBCUTANEOUS
  Administered 2019-05-31: 13:00:00 5 [IU] via SUBCUTANEOUS
  Administered 2019-06-01: 2 [IU] via SUBCUTANEOUS
  Administered 2019-06-01: 3 [IU] via SUBCUTANEOUS
  Administered 2019-06-02: 5 [IU] via SUBCUTANEOUS
  Administered 2019-06-02 – 2019-06-04 (×3): 2 [IU] via SUBCUTANEOUS
  Administered 2019-06-06: 5 [IU] via SUBCUTANEOUS
  Administered 2019-06-07: 18:00:00 3 [IU] via SUBCUTANEOUS
  Administered 2019-06-07: 2 [IU] via SUBCUTANEOUS
  Administered 2019-06-08: 3 [IU] via SUBCUTANEOUS
  Administered 2019-06-09: 17:00:00 2 [IU] via SUBCUTANEOUS
  Administered 2019-06-10: 5 [IU] via SUBCUTANEOUS
  Administered 2019-06-11: 3 [IU] via SUBCUTANEOUS
  Administered 2019-06-12: 07:00:00 2 [IU] via SUBCUTANEOUS
  Administered 2019-06-18: 5 [IU] via SUBCUTANEOUS
  Administered 2019-06-19: 2 [IU] via SUBCUTANEOUS
  Administered 2019-06-22: 15 [IU] via SUBCUTANEOUS
  Administered 2019-06-24: 8 [IU] via SUBCUTANEOUS

## 2019-05-23 MED ORDER — GLIPIZIDE 5 MG PO TABS
2.5000 mg | ORAL_TABLET | Freq: Two times a day (BID) | ORAL | Status: DC
  Administered 2019-05-24 – 2019-05-25 (×3): 2.5 mg via ORAL
  Filled 2019-05-23 (×3): qty 1

## 2019-05-23 MED ORDER — INSULIN ASPART 100 UNIT/ML ~~LOC~~ SOLN
0.0000 [IU] | Freq: Every day | SUBCUTANEOUS | Status: DC
  Administered 2019-06-14: 22:00:00 3 [IU] via SUBCUTANEOUS

## 2019-05-23 NOTE — Progress Notes (Signed)
STROKE TEAM PROGRESS NOTE   INTERVAL HISTORY Patient sitting in chair, PT is taking patient out of unit for some sunshine.  Neuro stable, pending SNF.  Vitals:   05/23/19 0005 05/23/19 0437 05/23/19 0737 05/23/19 1214  BP: 106/80 109/85 116/82 112/82  Pulse: 79 88 80 88  Resp: 18 16 12 16   Temp: 98.9 F (37.2 C) 98.2 F (36.8 C) 97.9 F (36.6 C) 97.9 F (36.6 C)  TempSrc:  Oral Oral Oral  SpO2: 100% 100% 100% 100%  Weight:      Height:        CBC:  Recent Labs  Lab 05/21/19 0448  WBC 5.5  HGB 12.3*  HCT 40.0  MCV 73.3*  PLT 740    Basic Metabolic Panel:  Recent Labs  Lab 05/21/19 0448  NA 136  K 4.3  CL 102  CO2 24  GLUCOSE 163*  BUN 11  CREATININE 0.79  CALCIUM 9.6    IMAGING CT Head WO Contrast 04/16/2019 Areas of acute infarction are showing low-density and swelling but no macroscopic hemorrhage by CT. As shown by previous MRI, there is involvement of the cerebellum, left cerebral peduncle and thalamus and left posteromedial temporal lobe and occipital lobe. Tiny focus of infarction in the right occipital lobe as well.  [name] 04/15/2019 1. Acute infarction throughout the left PCA territory (including most of the left thalamus), left more than right cerebellum, and right pons. Petechial hemorrhage is seen at the level of the pons.  2. Prior left frontal infarcts and microvascular ischemic gliosis.   Cerebral angiogram 04/14/2019 4 vessel cerebral arteriogram followed by complete revascularization of occluded Lt PCA  With x 2 passes with 30mm x 31mm embotrap retriever  device achieving a TICI 3 revascularization.  Ct Code Stroke Cta Head W/wo Contrast Ct Code Stroke Cta Neck W/wo Contrast Ct Code Stroke Cta Cerebral Perfusion W/wo Contrast 04/14/2019 1. Left P2 occlusion with downstream reconstitution. Qualitative assessment of CT perfusion maps shows ischemia without visible core infarct in the left occipital lobe.  2. Mild atherosclerosis  in the neck without flow limiting stenosis or embolic source seen.  3. No large vessel occlusion in the anterior circulation.  4. Mild mid basilar narrowing.   Ct Head Code Stroke Wo Contrast 04/14/2019 1. No acute finding.  2. Small remote appearing infarcts in the left frontal lobe.    LE Doppler 04/15/2019 Right: There is no evidence of deep vein thrombosis in the lower extremity. No cystic structure found in the popliteal fossa. Left: There is no evidence of deep vein thrombosis in the lower extremity. No cystic structure found in the popliteal fossa.  2D Echocardiogram 04/15/2019  1. The left ventricle has normal systolic function, with an ejection fraction of 55-60%. The cavity size was normal. Left ventricular diastolic parameters were normal. No evidence of left ventricular regional wall motion abnormalities.  2. The right ventricle has normal systolic function. The cavity was normal. There is no increase in right ventricular wall thickness.  3. The mitral valve is abnormal. Mild thickening of the mitral valve leaflet. The MR jet is posteriorly-directed.  4. The tricuspid valve is grossly normal.  5. The aortic valve is tricuspid. No stenosis of the aortic valve.  6. The aorta is normal unless otherwise noted.  7. The inferior vena cava was normal in size with <50% respiratory variability. SUMMARY LVEF 55-60%, normal wall thickness, normal wall motion, normal diastolic function, mild posteriorly directed MR, normal size IVC that  does not collapse, no PFO by color doppler  FINDINGS  Left Ventricle: The left ventricle has normal systolic function, with an ejection fraction of 55-60%. The cavity size was normal. There is no increase in left ventricular wall thickness. Left ventricular diastolic parameters were normal. Normal left ventricular filling pressures No evidence of left ventricular regional wall motion abnormalities..  EEG  04/15/2019 This study is suggestive of cortical  dysfunction in the left hemisphere secondary to underlying stroke as well as mild diffuse encephalopathy. No seizures or definite epileptiform discharges were seen throughout the recording.  TCD w/ bubble 04/18/2019  No HTIS heard during rest. No HITS heard during valsalva.  TEE 04/19/2019 LVEF 55-60% No LA/LAA thrombus or mass No ASD or PFO by color flow Doppler or saline microcavitation study. Mild AR, mild MR.   PHYSICAL EXAM   Frail middle aged african [place] male not in distress. . Afebrile. Head is nontraumatic. Neck is supple without bruit.    Cardiac exam no murmur or gallop. Lungs are clear to auscultation. Distal pulses are well felt. Neurological Exam: awake, alert, eyes open, following simple commands. Hypophonia improving, able to have some words spoken out.  Slight dysconjugate eyes, left eye mild INO, right eye CN VI incomplete palsy, up and downward gaze palsy. PERRL. Not blinking to visual threat on the right. Right facial droop, tongue midline, LUE 5/5 but significant ataxia. LLE 4/5 at least. On pain stimulation, slight withdraw of RLE but no movement of RUE. Increased muscle tone on the RUE. Positive triple reflex on the right. Sensation and gait not tested.   ASSESSMENT/PLAN [name] with no significant past medical history presenting with right-sided weakness, decreased sensation on the right, diplopia and headache. Complete revascularization of occluded L PCA w/ mechanical thrombectomy.  Stroke: embolic multifocal posterior circulation infarcts with left P2 occlusion s/p IR with TICI3 reperfusion - likely embolic   Code Stroke CT head No acute abnormality. Old infarct L frontal lobe.   CTA head & neck L P2 occlusion. Mild neck atherosclerosis . No LVO anterior circulation. Mild mid BA narrowing.  CT perfusion ischemia w/o visible core  CT Head - 04/16/19 - Areas of acute infarction are showing low-density and swelling but no macroscopic  hemorrhage by CT.    Cerebral angio TICI3 reperfusion of occluded L PCA  MRI  L PCA (including thalamus), L>R cerebellum, R pontine and left midbrain infarct. Petechial hemorrhage seen in pons. Old L frontal infarcts.   LE Doppler no DVT - no evidence of deep vein thrombosis   2D Echo - EF 55-60%.  No cardiac source of emboli identified.   TCD bubble no HITS  TEE neg  UDS - pos for benzos - UDS done on 9/1  EEG - cortical dysfunction but no seizures.  LDL 88  HgbA1c 12.3  Lovenox 40 mg sq daily  for VTE prophylaxis  No antithrombotic prior to admission, finished DAPT 3-week course, now on aspirin alone   Therapy recommendations:  CIR -> D/t lack of post hospitalization family support - will pursue SNF - will need rapid covid testing prior to D/C  Medically ready for d/c when bed found  Disposition:  pending (SW trying to help family w/ placement, MA MCD and transfer back to [place])  Patient remains stable  Possible seizure  Full body rigid shaking hours post IR, L>R  Loaded with keppra  EEG - cortical dysfunction but no seizures.  Continue keppra 500mg  bid  Hypertension  No hx of HTN, on no home meds  Placed On cleviprex gtt for BP control post IR, now off . On amlodipine 10 . Now on lotensin to 10mg  bid  . BP100-110s . BP goal normotensive  Hyperlipidemia  Home meds:  No statin  LDL 88, goal < 70  HDL 39  On lipitor 20  Continue statin at discharge  Diabetes type II, new diagnosis, Uncontrolled Hyperglycemia, improving  HgbA1c 12.3, goal < 7.0  CBGs  continue metformin 1000mg  bid   off glipizde to 5 mg bid d/t hypoglycemia  restart glipizide 2.5mg  QAC -> 2.5mg  bid  DM coordinator has seen - should glucoses remain elevated. May benefit from SGLT-2inhibitior if he gets insurance   SSI upgrade to 0-15 scale  Glucoses 200s   Likely hypercoagulable state associated with uncontrolled DM  Dysphagia Malnutrition  . Secondary to  stroke . Speech on board . Diet increased to D3 thin liquids . Encourage po intake . Off IVF, lab stable . On glucerna shakes tid  UTI with fever, resolved  UA showed WBC > 50  Urine culture enterobactor and staph coag neg  Sensitive to cipro  Finished cipro 500mg  bid course (ends 9/8)  Fever resolved   Other Stroke Risk Factors   hx of stroke - small remote appearing infarcts in the left frontal lobe by CT and MRI  Other Active Problems  Microcytic anemia - iron 38, Ferritin 115 - put on iron tab bid.  Pt found on floor 04/23/19 by nursing staff. No obvious injury, complaints of pain, or neurologic changes.   Hospital day # [id number]   Marvel PlanJindong Brittony Billick, MD PhD Stroke Neurology 05/23/2019 12:53 PM   To contact Stroke Continuity provider, please refer to [email or web address]. After hours, contact General Neurology

## 2019-05-23 NOTE — Progress Notes (Addendum)
Speech Language Pathology Treatment: Dysphagia  Patient Details Name: Rodney Collier MRN: [id number] DOB: 1965/11/16 Today's Date: 05/23/2019 Time: 0800-0850 SLP Time Calculation (min) (ACUTE ONLY): 50 min  Assessment / Plan / Recommendation Clinical Impression  Today's session focused on consumption of breakfast with reinforcement for effective compensation strategies.  In addition, language treatment conducted to maximize functional communication.  Pt able to feed himself using left hand well although he takes very large boluses - both solid and liquids requiring max cues to minimally slow rate.  Pt did NOT demonstrate any indications of aspiration with po *audible aspiration on MBS 04/18/2019 and currently his voice is very strong *suspect baseline.  Significant oral pocketing with all solids was cleared with brushing teeth after meal as pt did not conduct finger sweep despite verbal/visual cues.  He consumed an entire [place] sandwich, 8 ounces water and diet gingerale.  Use of straw prevents right anterior loss of liquids.  Recommend to advance diet to dys3/thin with full supervision.  MD secure chatted with recommendations.    Functional language treatment conducted during session with pt benefiting from verbal choice of items to meet his needs with 90% effectiveness.  His novel expressive communication is difficult to comprehend - he advises he speaks [place] and learned English at age 20.  SLP cleaned pt's glasses for him to use - however he tends to wince and removes them.  Portions of pt's expressive language was comprehensible with context including "pleasure" - indicating pleasure to work with you - per verbal confirmation.   Pt also stated "comasa" stating it was [place] - SLP looked up word and pt confirmed it meant "so so". Pt was able to follow one step directions consistently with mod I during testing most notably in functional setting.   Pt appeared happy to participate in session today  especially as he taught SLP a french word.  Using context facilitates comprehension and likely encourages pt's participation.  Further treatment recommended.      HPI HPI: Pt is a 53 y.o. M with no known PMH who presents with right sided weakness, decreased sensation on right, diplopia and headache. CT showing remote L frontal infarcts, CTA head/neck with L P2 occlusion, MRI with acute L PCA infarct including much of L thalamus, L > R cerebellum, R pons. Now s/p complete revascularization of occluded L PCA with mechanical thrombectomy. ETT 8/27-8/28; self extubated 8/28.      SLP Plan  Continue with current plan of care       Recommendations  Diet recommendations: Dysphagia 3 (mechanical soft);Thin liquid Liquids provided via: Cup;Straw Medication Administration: Whole meds with puree Supervision: Patient able to self feed;Full supervision/cueing for compensatory strategies Compensations: Minimize environmental distractions;Small sips/bites;Slow rate;Lingual sweep for clearance of pocketing(brush teeth to clear orally pocketed material from right side of oral cavity that he does not clear nor sense, used mirror to increase awareness to rediual without success) Postural Changes and/or Swallow Maneuvers: Seated upright 90 degrees;Upright 30-60 min after meal                Oral Care Recommendations: Other (Comment)(brush teeth after meals to assure oral clearance) Follow up Recommendations: 24 hour supervision/assistance;Skilled Nursing facility SLP Visit Diagnosis: Aphasia (R47.01);Apraxia (R48.2);Dysphagia, oropharyngeal phase (R13.12) Plan: Continue with current plan of care       [place], Montoya Watkin Ann 05/23/2019, 9:07 AM   Luanna Salk, MS [facility] SLP Acute Rehab  Services Pager [phone] Office [phone]

## 2019-05-23 NOTE — Progress Notes (Signed)
Nutrition Follow-up  DOCUMENTATION CODES:   Not applicable  INTERVENTION:  Continue Glucerna Shake po TID, each supplement provides 220 kcal and 10 grams of protein.  Encourage adequate PO intake.   NUTRITION DIAGNOSIS:   Inadequate oral intake related to inability to eat as evidenced by NPO status; diet advanced; improving  GOAL:   Patient will meet greater than or equal to 90% of their needs; progressing  MONITOR:   PO intake, Supplement acceptance, Diet advancement, Skin, Weight trends, Labs, I & O's  REASON FOR ASSESSMENT:   Ventilator    ASSESSMENT:   Rodney Collier is a 53 y.o. male with no significant past medical history presenting with right-sided weakness, decreased sensation on the right, diplopia and headache. Complete revascularization of occluded L PCA w/ mechanical thrombectomy. Self extubated 8/28.TEE9/1which showed no PFO/clot or cardiac source of aneurysm.  Diet has been advanced to a dysphagia 3 diet with thin liquids. Meal completion 10-75% prior to recent advancement. Pt currently has Glucerna shake ordered and has been consuming them. RD continue with current orders to aid in caloric and protein needs. Labs and medications reviewed.   Diet Order:   Diet Order            DIET DYS 3 Room service appropriate? Yes; Fluid consistency: Thin  Diet effective now              EDUCATION NEEDS:   Not appropriate for education at this time  Skin:  Skin Assessment: Reviewed RN Assessment Skin Integrity Issues:: Other (Comment) Other: N/A  Last BM:  9/30  Height:   Ht Readings from Last 1 Encounters:  04/19/19 6' (1.829 m)    Weight:   Wt Readings from Last 1 Encounters:  04/19/19 77 kg    Ideal Body Weight:  80.9 kg  BMI:  Body mass index is 23.02 kg/m.  Estimated Nutritional Needs:   Kcal:  2000-2200  Protein:  100-115 grams  Fluid:  > 2.0 L    [name], MS, RD, LDN Pager # [phone] After hours/ weekend pager #  [phone]

## 2019-05-23 NOTE — Progress Notes (Signed)
Physical Therapy Treatment Patient Details Name: Rodney Collier MRN: [id number] DOB: 1966/04/03 Today's Date: 05/23/2019    History of Present Illness Pt is a 53 y.o. M with no known PMH who presents with right sided weakness, decreased sensation on right, diplopia and headache. CT showing remote L frontal infarcts, CTA head/neck with L P2 occlusion, MRI with acute L PCA infarct including much of L thalamus, L > R cerebellum, R pons. Now s/p complete revascularization of occluded L PCA with mechanical thrombectomy. ETT 8/27-8/28; self extubated 8/28.    PT Comments    Treatment focused on wheelchair mobility to outside sitting area where we worked on a cognitive activity with OT. Pt appeared to enjoy being outside and at end of session asking when we were going to work with therapy again, and if we could go outside again. Speaking [place] intermittently throughout session. Noted pt tearful at times, reporting frustration with not being able to communicate well. Will continue to follow and progress as able per PT POC.     Follow Up Recommendations  Supervision/Assistance - 24 hour;SNF(rehab denied)     Equipment Recommendations  Wheelchair (measurements PT);Wheelchair cushion (measurements PT);3in1 (PT);Hospital bed    Recommendations for Other Services Rehab consult     Precautions / Restrictions Precautions Precautions: Fall Precaution Comments: R hemiparesis and inattention. R shoulder subluxation. Restrictions Weight Bearing Restrictions: No    Mobility  Bed Mobility Overal bed mobility: Needs Assistance Bed Mobility: Rolling;Sidelying to Sit;Sit to Supine Rolling: Min assist Sidelying to sit: Mod assist   Sit to supine: Mod assist;+2 for safety/equipment   General bed mobility comments: Mod assist to support trunk and provide hand held assist to pull up to sitting EOB.  Assist to progress right leg to EOB.   Transfers Overall transfer level: Needs assistance Equipment  used: 2 person hand held assist Transfers: Sit to/from [employer] Sit to Stand: Mod assist;+2 safety/equipment Stand pivot transfers: Mod assist;+2 safety/equipment       General transfer comment: Second person mainly for safety. Pt with good reach for arm rest of wheelchair to get himself pivoted around. VC's and increased time to scoot himself all the way back in the wheelchair.   Ambulation/Gait                 Psychologist, counselling mobility: Yes Wheelchair propulsion: Left upper extremity;Left lower extremity Wheelchair parts: Needs assistance Distance: 200 Wheelchair Assistance Details (indicate cue type and reason): Assist for L hand placement on rail of wheel to push. Coordination decreased at times but overall did well to advance wheelchair forward. Assist to steer - increased assist towards end as pt fatigued.   Modified Rankin (Stroke Patients Only) Modified Rankin (Stroke Patients Only) Pre-Morbid Rankin Score: No symptoms Modified Rankin: Severe disability     Balance Overall balance assessment: Needs assistance Sitting-balance support: Feet supported;Single extremity supported Sitting balance-Leahy Scale: Fair Sitting balance - Comments: Poor initially but progressed to fair. Reliant on L UE to stabilize against foot board for balance.  Postural control: Right lateral lean;Posterior lean                                  Cognition Arousal/Alertness: Awake/alert Behavior During Therapy: Flat affect;Impulsive Overall Cognitive Status: Difficult to assess Area of Impairment: Safety/judgement;Awareness  Orientation Level: Place;Time;Situation;Disoriented to Current Attention Level: Selective Memory: Decreased recall of precautions Following Commands: Follows one step commands with increased time;Follows multi-step commands  inconsistently Safety/Judgement: Decreased awareness of safety;Decreased awareness of deficits Awareness: Emergent Problem Solving: Difficulty sequencing;Requires verbal cues;Requires tactile cues General Comments: Speaking [place] for a majority of the session. Expressing frustration with not being able to communicate.       Exercises      General Comments        Pertinent Vitals/Pain Pain Assessment: No/denies pain Pain Intervention(s): Monitored during session    Home Living                      Prior Function            PT Goals (current goals can now be found in the care plan section) Acute Rehab PT Goals Patient Stated Goal: To communicate better PT Goal Formulation: Patient unable to participate in goal setting Time For Goal Achievement: 06/03/19 Potential to Achieve Goals: Good Progress towards PT goals: Progressing toward goals    Frequency    Min 3X/week      PT Plan Current plan remains appropriate    Co-evaluation PT/OT/SLP Co-Evaluation/Treatment: Yes Reason for Co-Treatment: Complexity of the patient's impairments (multi-system involvement);Necessary to address cognition/behavior during functional activity;For patient/therapist safety;To address functional/ADL transfers PT goals addressed during session: Mobility/safety with mobility;Balance;Proper use of DME        AM-PAC PT "6 Clicks" Mobility   Outcome Measure  Help needed turning from your back to your side while in a flat bed without using bedrails?: A Lot Help needed moving from lying on your back to sitting on the side of a flat bed without using bedrails?: A Lot Help needed moving to and from a bed to a chair (including a wheelchair)?: A Lot Help needed standing up from a chair using your arms (e.g., wheelchair or bedside chair)?: A Lot Help needed to walk in hospital room?: Total Help needed climbing 3-5 steps with a railing? : Total 6 Click Score: 10    End of Session  Equipment Utilized During Treatment: Gait belt Activity Tolerance: Patient tolerated treatment well Patient left: in bed;with call bell/phone within reach;with bed alarm set Nurse Communication: Mobility status PT Visit Diagnosis: Unsteadiness on feet (R26.81);Muscle weakness (generalized) (M62.81);Difficulty in walking, not elsewhere classified (R26.2);Hemiplegia and hemiparesis Hemiplegia - Right/Left: Right Hemiplegia - dominant/non-dominant: Non-dominant Hemiplegia - caused by: Cerebral infarction     Time: 1308-1400 PT Time Calculation (min) (ACUTE ONLY): 52 min  Charges:  $Wheel Chair Management: 23-37 mins                     Rolinda Roan, PT, DPT Acute Rehabilitation Services Pager: [phone] Office: [phone]    Thelma Comp 05/23/2019, 2:53 PM

## 2019-05-23 NOTE — Progress Notes (Signed)
Occupational Therapy Treatment Patient Details Name: Rodney Collier MRN: [id number] DOB: [birth year] Today's Date: 05/23/2019    History of present illness Pt is a 53 y.o. M with no known PMH who presents with right sided weakness, decreased sensation on right, diplopia and headache. CT showing remote L frontal infarcts, CTA head/neck with L P2 occlusion, MRI with acute L PCA infarct including much of L thalamus, L > R cerebellum, R pons. Now s/p complete revascularization of occluded L PCA with mechanical thrombectomy. ETT 8/27-8/28; self extubated 8/28.   OT comments  Pt continues to present with high motivation and desire to participate in therapy. Pt requiring Mod A +2 for stand pivot to w/c with blocking of right knee. Performing session with PT and going outside. While outside, pt participating in visual scanning activity to challenge right inattention, problem solving, following of commands, and crossing of midline by LUE. Pt requiring Mod cues for location of items in right visual field; also noting pt closing his left eye for decreasing diplopia. Pt speaking [place] intermittently throughout session. Continue to recommend dc to post-acute rehab and will continue to follow acutely as admitted.    Follow Up Recommendations  SNF    Equipment Recommendations  None recommended by OT    Recommendations for Other Services      Precautions / Restrictions Precautions Precautions: Fall Precaution Comments: R hemiparesis and inattention. R shoulder subluxation. Restrictions Weight Bearing Restrictions: No       Mobility Bed Mobility Overal bed mobility: Needs Assistance Bed Mobility: Rolling;Sidelying to Sit;Sit to Supine Rolling: Min assist Sidelying to sit: Mod assist   Sit to supine: Mod assist;+2 for safety/equipment   General bed mobility comments: Mod assist to support trunk and provide hand held assist to pull up to sitting EOB.  Assist to progress right leg to EOB.    Transfers Overall transfer level: Needs assistance Equipment used: 2 person hand held assist Transfers: Sit to/from [facility] Sit to Stand: Mod assist;+2 safety/equipment Stand pivot transfers: Mod assist;+2 safety/equipment       General transfer comment: Second person mainly for safety. Pt with good reach for arm rest of wheelchair to get himself pivoted around. VC's and increased time to scoot himself all the way back in the wheelchair.     Balance Overall balance assessment: Needs assistance Sitting-balance support: Feet supported;Single extremity supported Sitting balance-Leahy Scale: Fair Sitting balance - Comments: Poor initially but progressed to fair. Reliant on L UE to stabilize against foot board for balance.  Postural control: Right lateral lean;Posterior lean Standing balance support: Single extremity supported Standing balance-Leahy Scale: Poor Standing balance comment: Requiring physical A                           ADL either performed or assessed with clinical judgement   ADL Overall ADL's : Needs assistance/impaired                         Toilet Transfer: Moderate assistance;+2 for safety/equipment;Stand-pivot Toilet Transfer Details (indicate cue type and reason): Simulated to w/c. Mod A for blocking rightk nee, power up into standing, and maintain standing balance         Functional mobility during ADLs: Moderate assistance;+2 for safety/equipment(stand pivot) General ADL Comments: Focused session on attention to right side, functional mobility, and theraputic activity. Pt requiring Mod cues for attention to right side of environement during w/c mobility and theraputic activity  for visual scanning     Vision   Vision Assessment?: Vision impaired- to be further tested in functional context Additional Comments: Continues to close with left eye for improving diplopia. When reaching out for objects with both eyes open,  pt undershooting.    Perception     Praxis      Cognition Arousal/Alertness: Awake/alert Behavior During Therapy: Flat affect;Impulsive Overall Cognitive Status: Difficult to assess Area of Impairment: Safety/judgement;Awareness;Problem solving;Following commands                 Orientation Level: Place;Time;Situation;Disoriented to Current Attention Level: Selective Memory: Decreased recall of precautions Following Commands: Follows one step commands with increased time;Follows multi-step commands inconsistently Safety/Judgement: Decreased awareness of safety;Decreased awareness of deficits Awareness: Emergent Problem Solving: Difficulty sequencing;Requires verbal cues;Requires tactile cues General Comments: Speaking [place] for a majority of the session. Expressing frustration with not being able to communicate. Focused session on cognition and attention to right side.        Exercises Exercises: Other exercises Other Exercises Other Exercises: Theraputic activity with patient performing visual scanning task to identify sticky notes with circles vs squares.Challenging attention to right side, visual scanning, problem solving, following commands, and crossing midline with LUE. Pt requiring Mod VCs for locating sticky notes on edge of right visual field   Shoulder Instructions       General Comments      Pertinent Vitals/ Pain       Pain Assessment: No/denies pain Pain Intervention(s): Monitored during session  Home Living                                          Prior Functioning/Environment              Frequency  Min 2X/week        Progress Toward Goals  OT Goals(current goals can now be found in the care plan section)  Progress towards OT goals: Progressing toward goals  Acute Rehab OT Goals Patient Stated Goal: To communicate better OT Goal Formulation: Patient unable to participate in goal setting Time For Goal Achievement:  05/19/19 Potential to Achieve Goals: Good ADL Goals Pt Will Perform Grooming: with min assist;sitting Pt Will Transfer to Toilet: with max assist;bedside commode;stand pivot transfer Additional ADL Goal #1: pt will complete bed mobility mod (A) as precursor to adls Additional ADL Goal #2: pt will complete 2 step command  Plan Discharge plan remains appropriate    Co-evaluation    PT/OT/SLP Co-Evaluation/Treatment: Yes Reason for Co-Treatment: Complexity of the patient's impairments (multi-system involvement);For patient/therapist safety;To address functional/ADL transfers PT goals addressed during session: Mobility/safety with mobility;Balance;Proper use of DME OT goals addressed during session: ADL's and self-care      AM-PAC OT "6 Clicks" Daily Activity     Outcome Measure   Help from another person eating meals?: A Lot Help from another person taking care of personal grooming?: A Lot Help from another person toileting, which includes using toliet, bedpan, or urinal?: A Lot Help from another person bathing (including washing, rinsing, drying)?: A Lot Help from another person to put on and taking off regular upper body clothing?: A Lot Help from another person to put on and taking off regular lower body clothing?: A Lot 6 Click Score: 12    End of Session Equipment Utilized During Treatment: Gait belt  OT Visit Diagnosis: Unsteadiness on feet (R26.81);Hemiplegia and  hemiparesis Hemiplegia - Right/Left: Right Hemiplegia - dominant/non-dominant: Dominant Hemiplegia - caused by: Cerebral infarction   Activity Tolerance Patient tolerated treatment well   Patient Left with call bell/phone within reach;in bed;with bed alarm set   Nurse Communication Mobility status        Time: 1313-1400 OT Time Calculation (min): 47 min  Charges: OT General Charges $OT Visit: 1 Visit OT Treatments $Therapeutic Activity: 8-22 mins  Brace Welte MSOT, OTR/L Acute Rehab Pager:  [phone] Office: [phone]   Theodoro Grist Jaymie Misch 05/23/2019, 3:21 PM

## 2019-05-24 LAB — GLUCOSE, CAPILLARY
Glucose-Capillary: 131 mg/dL — ABNORMAL HIGH (ref 70–99)
Glucose-Capillary: 93 mg/dL (ref 70–99)
Glucose-Capillary: 122 mg/dL — ABNORMAL HIGH (ref 70–99)
Glucose-Capillary: 50 mg/dL — ABNORMAL LOW (ref 70–99)
Glucose-Capillary: 128 mg/dL — ABNORMAL HIGH (ref 70–99)

## 2019-05-24 MED ORDER — LORAZEPAM BOLUS VIA INFUSION: 2.0000 mg | Freq: Once | INTRAVENOUS | Status: DC

## 2019-05-24 MED ORDER — LORAZEPAM 2 MG/ML IJ SOLN
2.0000 mg | Freq: Once | INTRAMUSCULAR | Status: AC
  Administered 2019-05-24: 2 mg via INTRAVENOUS
  Filled 2019-05-24: qty 1

## 2019-05-24 MED ORDER — GLUCAGON HCL RDNA (DIAGNOSTIC) 1 MG IJ SOLR
INTRAMUSCULAR | Status: AC
  Administered 2019-05-24: 22:00:00 1 mg
  Filled 2019-05-24: qty 1

## 2019-05-24 NOTE — Progress Notes (Signed)
Pt CBG 50. Hypoglycemia protocol initiated.This nurse administered 1mg  glucagon. Will re-check CBG @2146 .

## 2019-05-24 NOTE — Progress Notes (Signed)
  Speech Language Pathology Treatment: Dysphagia  Patient Details Name: Rodney Collier MRN: [id number] DOB: [birth year] Today's Date: 05/24/2019 Time: 5726-2035 SLP Time Calculation (min) (ACUTE ONLY): 17 min  Assessment / Plan / Recommendation Clinical Impression  Pt moved rooms and his swallow precaution signs did not follow him unfortunately.  Today unfortunately his voice is weak and cough is congested and his CVA effects (motor planning deficits) prevents him from conducting dry swallows and adequate voice/cough on command.   SLP provided him with gingerale via straw and observed pt with very congested coughing post=swallow with apprx 40% of opportunities even when trying to take small boluses. Modifying liquids to nectar did was tolerated with only a single cough after sequential intake.  Improved tolerance of thicker liquids noted today and given pt's congested cough  - will modify to nectar for maximal airway protection.  Pt and RN educated to plan and need to continue full supervision.  RN does state pt only coughed x1 during meal this am.  Pt's impulsivity will be better accommodated by liquid thickened slightly.  Max cues for small boluses needed.    HPI HPI: Pt is a 53 y.o. M with no known PMH who presents with right sided weakness, decreased sensation on right, diplopia and headache. CT showing remote L frontal infarcts, CTA head/neck with L P2 occlusion, MRI with acute L PCA infarct including much of L thalamus, L > R cerebellum, R pons. Now s/p complete revascularization of occluded L PCA with mechanical thrombectomy. ETT 8/27-8/28; self extubated 8/28.      SLP Plan  Continue with current plan of care       Recommendations  Diet recommendations: Dysphagia 3 (mechanical soft);Nectar-thick liquid Liquids provided via: Cup;Straw Medication Administration: Whole meds with puree Supervision: Patient able to self feed;Full supervision/cueing for compensatory strategies Compensations:  Minimize environmental distractions;Small sips/bites;Slow rate;Lingual sweep for clearance of pocketing(brush teeth to clear orally pocketed material from right side of oral cavity that he does not clear nor sense, used mirror to increase awareness to rediual without success) Postural Changes and/or Swallow Maneuvers: Seated upright 90 degrees;Upright 30-60 min after meal                Oral Care Recommendations: Other (Comment)(brush teeth after meals to assure oral clearance) Follow up Recommendations: 24 hour supervision/assistance;Skilled Nursing facility SLP Visit Diagnosis: Aphasia (R47.01);Apraxia (R48.2);Dysphagia, oropharyngeal phase (R13.12) Plan: Continue with current plan of care       Branch, Elisa Sorlie Ann 05/24/2019, 11:34 AM   Luanna Salk, MS [facility] SLP Acute Rehab Services Pager [phone] Office [phone]

## 2019-05-24 NOTE — Progress Notes (Signed)
At 2123 patient's CBG was 50. Hypoglycemia protocol was initiated. This nurse administered 1mg  glucagon via IV. Pt CBG at 2149 was 128.

## 2019-05-24 NOTE — Progress Notes (Signed)
STROKE TEAM PROGRESS NOTE   INTERVAL HISTORY Pt now moved to room 13 because he had 2-3 times slip off chair while sitting in chair. No fall, no injury. Neuro stable.    Vitals:   05/23/19 2335 05/24/19 0100 05/24/19 0415 05/24/19 0821  BP: 121/80 126/88 (!) 132/97 136/84  Pulse: 88 95 75 86  Resp: 18 18 18 17   Temp: 98.7 F (37.1 C)  98.3 F (36.8 C) 97.6 F (36.4 C)  TempSrc: Axillary  Oral Oral  SpO2: 100% 98% 100% 100%  Weight:      Height:        CBC:  Recent Labs  Lab 05/21/19 0448  WBC 5.5  HGB 12.3*  HCT 40.0  MCV 73.3*  PLT 210    Basic Metabolic Panel:  Recent Labs  Lab 05/21/19 0448  NA 136  K 4.3  CL 102  CO2 24  GLUCOSE 163*  BUN 11  CREATININE 0.79  CALCIUM 9.6    IMAGING CT Head WO Contrast 04/16/2019 Areas of acute infarction are showing low-density and swelling but no macroscopic hemorrhage by CT. As shown by previous MRI, there is involvement of the cerebellum, left cerebral peduncle and thalamus and left posteromedial temporal lobe and occipital lobe. Tiny focus of infarction in the right occipital lobe as well.  [name] 04/15/2019 1. Acute infarction throughout the left PCA territory (including most of the left thalamus), left more than right cerebellum, and right pons. Petechial hemorrhage is seen at the level of the pons.  2. Prior left frontal infarcts and microvascular ischemic gliosis.   Cerebral angiogram 04/14/2019 4 vessel cerebral arteriogram followed by complete revascularization of occluded Lt PCA  With x 2 passes with 5mm x 84mm embotrap retriever  device achieving a TICI 3 revascularization.  Ct Code Stroke Cta Head W/wo Contrast Ct Code Stroke Cta Neck W/wo Contrast Ct Code Stroke Cta Cerebral Perfusion W/wo Contrast 04/14/2019 1. Left P2 occlusion with downstream reconstitution. Qualitative assessment of CT perfusion maps shows ischemia without visible core infarct in the left occipital lobe.  2. Mild  atherosclerosis in the neck without flow limiting stenosis or embolic source seen.  3. No large vessel occlusion in the anterior circulation.  4. Mild mid basilar narrowing.   Ct Head Code Stroke Wo Contrast 04/14/2019 1. No acute finding.  2. Small remote appearing infarcts in the left frontal lobe.    LE Doppler 04/15/2019 Right: There is no evidence of deep vein thrombosis in the lower extremity. No cystic structure found in the popliteal fossa. Left: There is no evidence of deep vein thrombosis in the lower extremity. No cystic structure found in the popliteal fossa.  2D Echocardiogram 04/15/2019  1. The left ventricle has normal systolic function, with an ejection fraction of 55-60%. The cavity size was normal. Left ventricular diastolic parameters were normal. No evidence of left ventricular regional wall motion abnormalities.  2. The right ventricle has normal systolic function. The cavity was normal. There is no increase in right ventricular wall thickness.  3. The mitral valve is abnormal. Mild thickening of the mitral valve leaflet. The MR jet is posteriorly-directed.  4. The tricuspid valve is grossly normal.  5. The aortic valve is tricuspid. No stenosis of the aortic valve.  6. The aorta is normal unless otherwise noted.  7. The inferior vena cava was normal in size with <50% respiratory variability. SUMMARY LVEF 55-60%, normal wall thickness, normal wall motion, normal diastolic function, mild posteriorly directed  MR, normal size IVC that does not collapse, no PFO by color doppler  FINDINGS  Left Ventricle: The left ventricle has normal systolic function, with an ejection fraction of 55-60%. The cavity size was normal. There is no increase in left ventricular wall thickness. Left ventricular diastolic parameters were normal. Normal left ventricular filling pressures No evidence of left ventricular regional wall motion abnormalities..  EEG  04/15/2019 This study is suggestive  of cortical dysfunction in the left hemisphere secondary to underlying stroke as well as mild diffuse encephalopathy. No seizures or definite epileptiform discharges were seen throughout the recording.  TCD w/ bubble 04/18/2019  No HTIS heard during rest. No HITS heard during valsalva.  TEE 04/19/2019 LVEF 55-60% No LA/LAA thrombus or mass No ASD or PFO by color flow Doppler or saline microcavitation study. Mild AR, mild MR.   PHYSICAL EXAM Frail middle aged african [place] male not in distress. . Afebrile. Head is nontraumatic. Neck is supple without bruit.    Cardiac exam no murmur or gallop. Lungs are clear to auscultation. Distal pulses are well felt. Neurological Exam: awake, alert, eyes open, following simple commands. Hypophonia improving, able to have some words spoken out.  Slight dysconjugate eyes, left eye mild INO, right eye CN VI incomplete palsy, up and downward gaze palsy. PERRL. Not blinking to visual threat on the right. Right facial droop, tongue midline, LUE 5/5 but significant ataxia. LLE 4/5 at least. On pain stimulation, slight withdraw of RLE but no movement of RUE. Increased muscle tone on the RUE. Positive triple reflex on the right. Sensation and gait not tested.   ASSESSMENT/PLAN Rodney Collier is a 53 y.o. male with no significant past medical history presenting with right-sided weakness, decreased sensation on the right, diplopia and headache. Complete revascularization of occluded L PCA w/ mechanical thrombectomy.  Stroke: embolic multifocal posterior circulation infarcts with left P2 occlusion s/p IR with TICI3 reperfusion - likely embolic   Code Stroke CT head No acute abnormality. Old infarct L frontal lobe.   CTA head & neck L P2 occlusion. Mild neck atherosclerosis . No LVO anterior circulation. Mild mid BA narrowing.  CT perfusion ischemia w/o visible core  CT Head - 04/16/19 - Areas of acute infarction are showing low-density and swelling but no  macroscopic hemorrhage by CT.    Cerebral angio TICI3 reperfusion of occluded L PCA  MRI  L PCA (including thalamus), L>R cerebellum, R pontine and left midbrain infarct. Petechial hemorrhage seen in pons. Old L frontal infarcts.   LE Doppler no DVT - no evidence of deep vein thrombosis   2D Echo - EF 55-60%.  No cardiac source of emboli identified.   TCD bubble no HITS  TEE neg  UDS - positive for benzos - UDS done on 9/1  EEG - cortical dysfunction but no seizures.  LDL 88  HgbA1c 12.3  Lovenox 40 mg sq daily  for VTE prophylaxis  No antithrombotic prior to admission, finished DAPT 3-week course, now on aspirin alone   Therapy recommendations:  CIR -> D/t lack of post hospitalization family support - will pursue SNF - will need rapid covid testing prior to D/C  Medically ready for d/c when bed found  Disposition:  pending (SW trying to help family w/ placement, [place] MCD and [place] back to [place])  Patient remains stable  Possible seizure  Full body rigid shaking hours post IR, L>R  Loaded with keppra  EEG - cortical dysfunction but no seizures.  Continue keppra  500mg  bid  Hypertension  No hx of HTN, on no home meds  Placed On cleviprex gtt for BP control post IR, now off . On amlodipine 10 . Now on lotensin to 10mg  bid  . BP120-130s . BP goal normotensive  Hyperlipidemia  Home meds:  No statin  LDL 88, goal < 70  HDL 39  On lipitor 20  Continue statin at discharge  Diabetes type II, new diagnosis, Uncontrolled Hyperglycemia, improving  HgbA1c 12.3, goal < 7.0  CBGs  continue metformin 1000mg  bid   off glipizde to 5 mg bid d/t hypoglycemia  restart glipizide 2.5mg  QAC -> 2.5mg  bid  DM coordinator has seen - should glucoses remain elevated. May benefit from SGLT-2inhibitior if he gets insurance   SSI upgrade to 0-15 scale, 0-5 hs  Glucoses 100s   Likely hypercoagulable state associated with uncontrolled DM  Dysphagia Malnutrition   . Secondary to stroke . Speech on board . Diet downgraded to D3 nectar thick liquids . Encourage po intake . Off IVF, lab stable . On glucerna shakes tid  UTI with fever, resolved  UA showed WBC > 50  Urine culture enterobactor and staph coag neg  Sensitive to cipro  Finished cipro 500mg  bid course (ends 9/8)  Fever resolved   Other Stroke Risk Factors   hx of stroke - small remote appearing infarcts in the left frontal lobe by CT and MRI  Other Active Problems  Microcytic anemia - iron 38, Ferritin 115 - put on iron tab bid.  Pt found on floor 04/23/19 by nursing staff. No obvious injury, complaints of pain, or neurologic changes.   Pt had 2-3 times slip off chair while sitting in chair. No fall, no injury. - now move to room 13 closer to nurse station.    Hospital day # 40   Rosalin Hawking, MD PhD Stroke Neurology 05/24/2019 11:37 AM   To contact Stroke Continuity provider, please refer to [email or web address] After hours, contact General Neurology

## 2019-05-24 NOTE — TOC Progression Note (Signed)
Transition of Care [facility]) - Progression Note    Patient Details  Name: Rodney Collier MRN: [id number] Date of Birth: [birth year]  Transition of Care [facility]) CM/SW [place], [place] Phone Number: 05/24/2019, 2:04 PM  Clinical Narrative:   CSW spoke with patient's daughter Rodney Collier that there were never any phone calls received back from [facility], [place], or [place], and CSW has tried to call several times. Daughter indicated understanding, and said that she would review the list of options and get back to CSW with other facilities to call. Samalie also asked about completing health care proxy; CSW to follow up with facilities that are able to offer on the patient to see if they can assist the daughter to complete guardianship for the patient.   CSW to await call back from daughter on other SNF choices to reach out to. CSW to follow. Patient has no bed offers at this time.    Expected Discharge Plan: [place] Barriers to Discharge: SNF Pending bed offer, Homeless with medical needs, Other (comment)(needs placement in [place])  Expected Discharge Plan and Services Expected Discharge Plan: [place] Choice: [place]                                         Social Determinants of Health (SDOH) Interventions    Readmission Risk Interventions No flowsheet data found.

## 2019-05-25 LAB — CBC
HCT: 42.9 % (ref 39.0–52.0)
Hemoglobin: 13.3 g/dL (ref 13.0–17.0)
MCH: 22.7 pg — ABNORMAL LOW (ref 26.0–34.0)
MCHC: 31 g/dL (ref 30.0–36.0)
MCV: 73.1 fL — ABNORMAL LOW (ref 80.0–100.0)
Platelets: 220 10*3/uL (ref 150–400)
RBC: 5.87 MIL/uL — ABNORMAL HIGH (ref 4.22–5.81)
RDW: 14.2 % (ref 11.5–15.5)
WBC: 5.8 10*3/uL (ref 4.0–10.5)
nRBC: 0 % (ref 0.0–0.2)

## 2019-05-25 LAB — COMPREHENSIVE METABOLIC PANEL
ALT: 51 U/L — ABNORMAL HIGH (ref 0–44)
AST: 27 U/L (ref 15–41)
Albumin: 3.3 g/dL — ABNORMAL LOW (ref 3.5–5.0)
Alkaline Phosphatase: 65 U/L (ref 38–126)
Anion gap: 14 (ref 5–15)
BUN: 12 mg/dL (ref 6–20)
CO2: 21 mmol/L — ABNORMAL LOW (ref 22–32)
Calcium: 9.7 mg/dL (ref 8.9–10.3)
Chloride: 102 mmol/L (ref 98–111)
Creatinine, Ser: 0.82 mg/dL (ref 0.61–1.24)
GFR calc Af Amer: >60 mL/min (ref >60)
GFR calc non Af Amer: >60 mL/min (ref >60)
Glucose, Bld: 91 mg/dL (ref 70–99)
Potassium: 4.4 mmol/L (ref 3.5–5.1)
Sodium: 137 mmol/L (ref 135–145)
Total Bilirubin: 0.9 mg/dL (ref 0.3–1.2)
Total Protein: 6.8 g/dL (ref 6.5–8.1)

## 2019-05-25 LAB — GLUCOSE, CAPILLARY
Glucose-Capillary: 82 mg/dL (ref 70–99)
Glucose-Capillary: 107 mg/dL — ABNORMAL HIGH (ref 70–99)
Glucose-Capillary: 200 mg/dL — ABNORMAL HIGH (ref 70–99)
Glucose-Capillary: 129 mg/dL — ABNORMAL HIGH (ref 70–99)

## 2019-05-25 MED ORDER — GLIPIZIDE 5 MG PO TABS
2.5000 mg | ORAL_TABLET | Freq: Every day | ORAL | Status: DC
  Administered 2019-05-26 – 2019-06-05 (×10): 2.5 mg via ORAL
  Filled 2019-05-25 (×11): qty 1

## 2019-05-25 NOTE — Progress Notes (Signed)
STROKE TEAM PROGRESS NOTE   INTERVAL HISTORY Pt lying in bed, no neuro changes. Later, pt was in wheelchair to be taken out off unit to enjoy sunshine. Had hypoglycemia reading last night, glipized back to daily dosing.   Vitals:   05/24/19 2339 05/25/19 0355 05/25/19 0839 05/25/19 1211  BP: 123/70 136/78 115/75 116/74  Pulse: 91 97 (!) 53 96  Resp: 18 18 16 16   Temp: 98.5 F (36.9 C) 98.3 F (36.8 C) 98.4 F (36.9 C) 98.1 F (36.7 C)  TempSrc: Oral Oral Oral Oral  SpO2: 100% 100%  100%  Weight:      Height:        CBC:  Recent Labs  Lab 05/21/19 0448 05/25/19 0538  WBC 5.5 5.8  HGB 12.3* 13.3  HCT 40.0 42.9  MCV 73.3* 73.1*  PLT 210 220    Basic Metabolic Panel:  Recent Labs  Lab 05/21/19 0448 05/25/19 0538  NA 136 137  K 4.3 4.4  CL 102 102  CO2 24 21*  GLUCOSE 163* 91  BUN 11 12  CREATININE 0.79 0.82  CALCIUM 9.6 9.7    IMAGING CT Head WO Contrast 04/16/2019 Areas of acute infarction are showing low-density and swelling but no macroscopic hemorrhage by CT. As shown by previous MRI, there is involvement of the cerebellum, left cerebral peduncle and thalamus and left posteromedial temporal lobe and occipital lobe. Tiny focus of infarction in the right occipital lobe as well.  [name] 04/15/2019 1. Acute infarction throughout the left PCA territory (including most of the left thalamus), left more than right cerebellum, and right pons. Petechial hemorrhage is seen at the level of the pons.  2. Prior left frontal infarcts and microvascular ischemic gliosis.   Cerebral angiogram 04/14/2019 4 vessel cerebral arteriogram followed by complete revascularization of occluded Lt PCA  With x 2 passes with 33mm x 26mm embotrap retriever  device achieving a TICI 3 revascularization.  Ct Code Stroke Cta Head W/wo Contrast Ct Code Stroke Cta Neck W/wo Contrast Ct Code Stroke Cta Cerebral Perfusion W/wo Contrast 04/14/2019 1. Left P2 occlusion with  downstream reconstitution. Qualitative assessment of CT perfusion maps shows ischemia without visible core infarct in the left occipital lobe.  2. Mild atherosclerosis in the neck without flow limiting stenosis or embolic source seen.  3. No large vessel occlusion in the anterior circulation.  4. Mild mid basilar narrowing.   Ct Head Code Stroke Wo Contrast 04/14/2019 1. No acute finding.  2. Small remote appearing infarcts in the left frontal lobe.    LE Doppler 04/15/2019 Right: There is no evidence of deep vein thrombosis in the lower extremity. No cystic structure found in the popliteal fossa. Left: There is no evidence of deep vein thrombosis in the lower extremity. No cystic structure found in the popliteal fossa.  2D Echocardiogram 04/15/2019  1. The left ventricle has normal systolic function, with an ejection fraction of 55-60%. The cavity size was normal. Left ventricular diastolic parameters were normal. No evidence of left ventricular regional wall motion abnormalities.  2. The right ventricle has normal systolic function. The cavity was normal. There is no increase in right ventricular wall thickness.  3. The mitral valve is abnormal. Mild thickening of the mitral valve leaflet. The MR jet is posteriorly-directed.  4. The tricuspid valve is grossly normal.  5. The aortic valve is tricuspid. No stenosis of the aortic valve.  6. The aorta is normal unless otherwise noted.  7. The  inferior vena cava was normal in size with <50% respiratory variability. SUMMARY LVEF 55-60%, normal wall thickness, normal wall motion, normal diastolic function, mild posteriorly directed MR, normal size IVC that does not collapse, no PFO by color doppler  FINDINGS  Left Ventricle: The left ventricle has normal systolic function, with an ejection fraction of 55-60%. The cavity size was normal. There is no increase in left ventricular wall thickness. Left ventricular diastolic parameters were normal.  Normal left ventricular filling pressures No evidence of left ventricular regional wall motion abnormalities..  EEG  04/15/2019 This study is suggestive of cortical dysfunction in the left hemisphere secondary to underlying stroke as well as mild diffuse encephalopathy. No seizures or definite epileptiform discharges were seen throughout the recording.  TCD w/ bubble 04/18/2019  No HTIS heard during rest. No HITS heard during valsalva.  TEE 04/19/2019 LVEF 55-60% No LA/LAA thrombus or mass No ASD or PFO by color flow Doppler or saline microcavitation study. Mild AR, mild MR.   PHYSICAL EXAM   Frail middle aged african [place] male not in distress. . Afebrile. Head is nontraumatic. Neck is supple without bruit.    Cardiac exam no murmur or gallop. Lungs are clear to auscultation. Distal pulses are well felt. Neurological Exam: awake, alert, eyes open, following simple commands. Hypophonia improving, able to have some words spoken out.  Slight dysconjugate eyes, left eye mild INO, right eye CN VI incomplete palsy, up and downward gaze palsy. PERRL. Not blinking to visual threat on the right. Right facial droop, tongue midline, LUE 5/5 but significant ataxia. LLE 4/5 at least. On pain stimulation, slight withdraw of RLE but no movement of RUE. Increased muscle tone on the RUE. Positive triple reflex on the right. Sensation and gait not tested.   ASSESSMENT/PLAN [name] with no significant past medical history presenting with right-sided weakness, decreased sensation on the right, diplopia and headache. Complete revascularization of occluded L PCA w/ mechanical thrombectomy.  Stroke: embolic multifocal posterior circulation infarcts with left P2 occlusion s/p IR with TICI3 reperfusion - likely embolic   Code Stroke CT head No acute abnormality. Old infarct L frontal lobe.   CTA head & neck L P2 occlusion. Mild neck atherosclerosis . No LVO anterior circulation. Mild  mid BA narrowing.  CT perfusion ischemia w/o visible core  CT Head - 04/16/19 - Areas of acute infarction are showing low-density and swelling but no macroscopic hemorrhage by CT.    Cerebral angio TICI3 reperfusion of occluded L PCA  MRI  L PCA (including thalamus), L>R cerebellum, R pontine and left midbrain infarct. Petechial hemorrhage seen in pons. Old L frontal infarcts.   LE Doppler no DVT - no evidence of deep vein thrombosis   2D Echo - EF 55-60%.  No cardiac source of emboli identified.   TCD bubble no HITS  TEE neg  UDS - positive for benzos - UDS done on 9/1  EEG - cortical dysfunction but no seizures.  LDL 88  HgbA1c 12.3  Lovenox 40 mg sq daily  for VTE prophylaxis  No antithrombotic prior to admission, finished DAPT 3-week course, now on aspirin alone   Therapy recommendations:  CIR -> D/t lack of post hospitalization family support - will pursue SNF - will need rapid covid testing prior to D/C  Medically ready for d/c when bed found  Disposition:  pending (SW trying to help family w/ placement, MA MCD and transfer back to [place])  Patient remains  stable  Possible seizure  Full body rigid shaking hours post IR, L>R  Loaded with keppra  EEG - cortical dysfunction but no seizures.  Continue keppra 500mg  bid  Hypertension  No hx of HTN, on no home meds  Placed On cleviprex gtt for BP control post IR, now off . On amlodipine 10 . Now on lotensin to 10mg  bid  . BP120-130s, stable . BP goal normotensive  Hyperlipidemia  Home meds:  No statin  LDL 88, goal < 70  HDL 39  On lipitor 20  Continue statin at discharge  Diabetes type II, new diagnosis, Uncontrolled Hyperglycemia, improving  HgbA1c 12.3, goal < 7.0  CBGs  continue metformin 1000mg  bid   off glipizde to 5 mg bid d/t hypoglycemia  restart glipizide 2.5mg  QAC -> 2.5mg  bid -> Glucagon 10/6 night for glu 50 just after 2100 -> glipizide back to 2.5 mg qacb  DM coordinator  following - should glucoses remain elevated. May benefit from SGLT-2inhibitior if he gets insurance   SSI 0-15 scale, 0-5 hs  Likely hypercoagulable state associated with uncontrolled DM  Dysphagia Malnutrition  . Secondary to stroke . Speech on board . Diet downgraded to D3 nectar thick liquids . Encourage po intake . Off IVF, lab stable . On glucerna shakes tid  UTI with fever, resolved  UA showed WBC > 50  Urine culture enterobactor and staph coag neg  Sensitive to cipro  Finished cipro 500mg  bid course (ends 9/8)  Fever resolved   Other Stroke Risk Factors   hx of stroke - small remote appearing infarcts in the left frontal lobe by CT and MRI  Other Active Problems  Microcytic anemia - iron 38, Ferritin 115 - put on iron tab bid.  Pt found on floor 04/23/19 by nursing staff. No obvious injury, complaints of pain, or neurologic changes.   Pt had 2-3 times slip off chair while sitting in chair. No fall, no injury. - now move to room 13 closer to nurse station.    Hospital day # [id number]    Marvel PlanJindong Zuriyah Shatz, MD PhD Stroke Neurology 05/25/2019 2:11 PM   To contact Stroke Continuity provider, please refer to [email or web address]. After hours, contact General Neurology

## 2019-05-25 NOTE — Progress Notes (Signed)
Inpatient Diabetes Program Recommendations  AACE/ADA: New Consensus Statement on Inpatient Glycemic Control (2015)  Target Ranges:  Prepandial:   less than 140 mg/dL      Peak postprandial:   less than 180 mg/dL (1-2 hours)      Critically ill patients:  140 - 180 mg/dL   Lab Results  Component Value Date   GLUCAP 82 05/25/2019   HGBA1C 12.3 (H) 04/15/2019    Review of Glycemic Control Results for Rodney Collier, Rodney Collier (MRN [id number]) as of 05/25/2019 11:19  Ref. Range 05/24/2019 06:44 05/24/2019 12:57 05/24/2019 17:19 05/24/2019 21:23 05/24/2019 21:49 05/25/2019 06:07  Glucose-Capillary Latest Ref Range: 70 - 99 mg/dL 131 (H) 93 122 (H) 50 (L) 128 (H) 82   Diabetes history: DM 2 Current orders for Inpatient glycemic control:  Glipizide 2.5 mg BID Novolog 0-15 units + hs Metformin 1000 mg bid  Inpatient Diabetes Program Recommendations:    Hypoglycemia last night. Consider decreasing frequency of glipizide to 2.5 mg Daily.  Thanks, Tama Headings RN, MSN, BC-ADM Inpatient Diabetes Coordinator Team Pager [phone] (8a-5p)

## 2019-05-25 NOTE — Progress Notes (Signed)
Occupational Therapy Treatment Patient Details Name: Rodney Collier MRN: [id number] DOB: 1966-05-01 Today's Date: 05/25/2019    History of present illness Pt is a 53 y.o. M with no known PMH who presents with right sided weakness, decreased sensation on right, diplopia and headache. CT showing remote L frontal infarcts, CTA head/neck with L P2 occlusion, MRI with acute L PCA infarct including much of L thalamus, L > R cerebellum, R pons. Now s/p complete revascularization of occluded L PCA with mechanical thrombectomy. ETT 8/27-8/28; self extubated 8/28.   OT comments  Pt's goals met and updated. Pt currently presents with high motivation and desire to participate in therapy. Pt currently requires modA+2 for stand pivot transfer to w/c. Session occurred outside with MD orders and PT present. Pt requires modcues for attending and scanning the right side of environment. He demonstrates increased consistency with crossing midline during table top activity. Pt wore occlusion glasses during activity and did not require closing his left eye. Pt continues to speak [place] during session. Pt will continue to benefit from skilled OT services to maximize safety and independence with ADL/IADL and functional mobility. Will continue to follow acutely and progress as tolerated.    Follow Up Recommendations  SNF    Equipment Recommendations  None recommended by OT    Recommendations for Other Services      Precautions / Restrictions Precautions Precautions: Fall Precaution Comments: R hemiparesis and inattention. R shoulder subluxation. Restrictions Weight Bearing Restrictions: No       Mobility Bed Mobility Overal bed mobility: Needs Assistance Bed Mobility: Supine to Sit;Sit to Supine     Supine to sit: Mod assist Sit to supine: Mod assist;+2 for safety/equipment   General bed mobility comments: Mod assist to support trunk and provide hand held assist to pull up to sitting EOB.  Assist to  progress right leg to EOB.   Transfers Overall transfer level: Needs assistance Equipment used: 1 person hand held assist Transfers: Sit to/from [facility] Sit to Stand: Mod assist;+2 safety/equipment Stand pivot transfers: Mod assist;+2 safety/equipment       General transfer comment: second person mainly for safety;pt with good reach for arm rest of designated surface    Balance Overall balance assessment: Needs assistance Sitting-balance support: Feet supported;Single extremity supported Sitting balance-Leahy Scale: Fair Sitting balance - Comments: fair initially, poor following propelling w/c outside  Postural control: Right lateral lean;Posterior lean Standing balance support: Single extremity supported Standing balance-Leahy Scale: Poor Standing balance comment: Requiring physical A                           ADL either performed or assessed with clinical judgement   ADL Overall ADL's : Needs assistance/impaired Eating/Feeding: Sitting;Minimal assistance Eating/Feeding Details (indicate cue type and reason): minA for appropriate sized bites and to clear mouth  Grooming: Wash/dry face;Sitting;Minimal assistance Grooming Details (indicate cue type and reason): minA for thorough wiping food off mouth                 Toilet Transfer: Moderate assistance;Stand-pivot Toilet Transfer Details (indicate cue type and reason): simulated to/from w/c         Functional mobility during ADLs: Moderate assistance;+2 for safety/equipment;Wheelchair General ADL Comments: focused session on attending to the right side, required max vc for scanning strategies     Vision   Vision Assessment?: Vision impaired- to be further tested in functional context Visual Fields: Right visual field deficit;Other (comment)(R innatention )  Diplopia Assessment: Disappears with one eye closed;Objects split side to side;Present all the time/all directions Depth  Perception: Undershoots Additional Comments: pt completed activity to locate sticky notes with shape and pass them to therapist sitting on R side of pt;pt required mod vc to scan environment and locate therapist;pt demonstrated good ability to cross midline, unshoots depth perception;pt was wearing occlusion glasses, able to keep both eyes open (as opposed to squinting without them on ) when looking at image   Perception     Praxis      Cognition Arousal/Alertness: Awake/alert Behavior During Therapy: Flat affect;Impulsive Overall Cognitive Status: Difficult to assess Area of Impairment: Safety/judgement;Awareness;Problem solving;Following commands                       Following Commands: Follows one step commands with increased time;Follows multi-step commands inconsistently Safety/Judgement: Decreased awareness of safety;Decreased awareness of deficits Awareness: Emergent Problem Solving: Difficulty sequencing;Requires verbal cues;Requires tactile cues General Comments: Speaking french majoirty of session;required consistent vc during eating (clear mouth and take small bites);pt demonstrated decreased awareness of deficits required max cues to scan Right        Exercises     Shoulder Instructions       General Comments took pt outside with permission from MD;pt appeared to enjoy his time outside    Pertinent Vitals/ Pain       Pain Assessment: No/denies pain  Home Living                                          Prior Functioning/Environment              Frequency  Min 2X/week        Progress Toward Goals  OT Goals(current goals can now be found in the care plan section)  Progress towards OT goals: Progressing toward goals;Goals met and updated - see care plan  Acute Rehab OT Goals Patient Stated Goal: To communicate better OT Goal Formulation: Patient unable to participate in goal setting Time For Goal Achievement:  05/19/19 Potential to Achieve Goals: Good ADL Goals Pt Will Perform Grooming: with min assist;sitting Pt Will Transfer to Toilet: with min assist;stand pivot transfer;bedside commode Additional ADL Goal #1: Pt will complete bed mobility with supervision as precursor to ADL Additional ADL Goal #2: Pt will consistently follow 2 step command  Plan Discharge plan remains appropriate    Co-evaluation    PT/OT/SLP Co-Evaluation/Treatment: Yes Reason for Co-Treatment: Complexity of the patient's impairments (multi-system involvement);For patient/therapist safety;To address functional/ADL transfers   OT goals addressed during session: ADL's and self-care      AM-PAC OT "6 Clicks" Daily Activity     Outcome Measure   Help from another person eating meals?: A Little Help from another person taking care of personal grooming?: A Lot Help from another person toileting, which includes using toliet, bedpan, or urinal?: A Lot Help from another person bathing (including washing, rinsing, drying)?: A Lot Help from another person to put on and taking off regular upper body clothing?: A Lot Help from another person to put on and taking off regular lower body clothing?: A Lot 6 Click Score: 13    End of Session Equipment Utilized During Treatment: Gait belt(wheelchair)  OT Visit Diagnosis: Unsteadiness on feet (R26.81);Hemiplegia and hemiparesis Hemiplegia - Right/Left: Right Hemiplegia - dominant/non-dominant: Dominant Hemiplegia - caused by: Cerebral infarction  Activity Tolerance Patient tolerated treatment well   Patient Left in bed;with call bell/phone within reach;with bed alarm set   Nurse Communication Mobility status        Time: 1655-3748 OT Time Calculation (min): 65 min  Charges: OT General Charges $OT Visit: 1 Visit OT Treatments $Self Care/Home Management : 23-37 mins  [place] Office: [place] 05/25/2019, 3:21 PM

## 2019-05-25 NOTE — Progress Notes (Signed)
Physical Therapy Treatment Patient Details Name: Rodney Collier MRN: [id number] DOB: 1965-12-25 Today's Date: 05/25/2019    History of Present Illness Pt is a 53 y.o. M with no known PMH who presents with right sided weakness, decreased sensation on right, diplopia and headache. CT showing remote L frontal infarcts, CTA head/neck with L P2 occlusion, MRI with acute L PCA infarct including much of L thalamus, L > R cerebellum, R pons. Now s/p complete revascularization of occluded L PCA with mechanical thrombectomy. ETT 8/27-8/28; self extubated 8/28.    PT Comments    Pt progressing towards physical therapy goals. Pt able to self-propel in the wheelchair with up to mod/max assist for turns and min assist for steering on a straight path. Focus of session was w/c mobility outside for lunch and cognitive activity. Pt requiring less time to complete task of identifying objects with glasses donned (taped on medial edge of R lens), and required up to max cues for continued scanning to R side. Will continue to follow and progress as able per POC.     Follow Up Recommendations  Supervision/Assistance - 24 hour;SNF(rehab denied)     Equipment Recommendations  Wheelchair (measurements PT);Wheelchair cushion (measurements PT);3in1 (PT);Hospital bed    Recommendations for Other Services Rehab consult     Precautions / Restrictions Precautions Precautions: Fall Precaution Comments: R hemiparesis and inattention. R shoulder subluxation. Restrictions Weight Bearing Restrictions: No    Mobility  Bed Mobility Overal bed mobility: Needs Assistance Bed Mobility: Supine to Sit;Sit to Supine Rolling: Min assist Sidelying to sit: Mod assist Supine to sit: Mod assist Sit to supine: Mod assist;+2 for safety/equipment   General bed mobility comments: Mod assist to support trunk and provide hand held assist to pull up to sitting EOB.  Assist to progress right leg to EOB.   Transfers Overall transfer  level: Needs assistance Equipment used: 1 person hand held assist Transfers: Sit to/from [facility] Sit to Stand: Mod assist;+2 safety/equipment Stand pivot transfers: Mod assist;+2 safety/equipment       General transfer comment: second person mainly for safety;pt with good reach for arm rest of designated surface  Ambulation/Gait                 Theme park manager mobility: Yes Wheelchair propulsion: Left upper extremity;Left lower extremity Wheelchair parts: Needs assistance Distance: 500 Wheelchair Assistance Details (indicate cue type and reason): Assist for L hand placement on rail of wheel to push. Coordination decreased at times but overall did well to advance wheelchair forward. Assist to steer - increased assist towards end as pt fatigued.   Modified Rankin (Stroke Patients Only) Modified Rankin (Stroke Patients Only) Pre-Morbid Rankin Score: No symptoms Modified Rankin: Severe disability     Balance Overall balance assessment: Needs assistance Sitting-balance support: Feet supported;Single extremity supported Sitting balance-Leahy Scale: Fair Sitting balance - Comments: fair initially, poor following propelling w/c outside  Postural control: Right lateral lean;Posterior lean Standing balance support: Single extremity supported Standing balance-Leahy Scale: Poor Standing balance comment: Requiring physical A                            Cognition Arousal/Alertness: Awake/alert Behavior During Therapy: Flat affect;Impulsive Overall Cognitive Status: Difficult to assess Area of Impairment: Safety/judgement;Awareness;Problem solving;Following commands                 Orientation  Level: Place;Time;Situation;Disoriented to Current Attention Level: Selective Memory: Decreased recall of precautions Following Commands: Follows one step commands with increased  time;Follows multi-step commands inconsistently Safety/Judgement: Decreased awareness of safety;Decreased awareness of deficits Awareness: Emergent Problem Solving: Difficulty sequencing;Requires verbal cues;Requires tactile cues General Comments: Speaking [place] and [place]. Appeared improved with scanning to the R but overall still requires max cues to fully attend to the R during an activity.       Exercises Other Exercises Other Exercises: Theraputic activity with patient performing visual scanning task to identify sticky notes with circles vs squares.Challenging attention to right side, visual scanning, problem solving, following commands, and crossing midline with LUE. Pt requiring Mod VCs for locating sticky notes on edge of right visual field    General Comments General comments (skin integrity, edema, etc.): took pt outside with permission from MD;pt appeared to enjoy his time outside      Pertinent Vitals/Pain Pain Assessment: No/denies pain    Home Living                      Prior Function            PT Goals (current goals can now be found in the care plan section) Acute Rehab PT Goals Patient Stated Goal: To communicate better PT Goal Formulation: Patient unable to participate in goal setting Time For Goal Achievement: 06/03/19 Potential to Achieve Goals: Good Progress towards PT goals: Progressing toward goals    Frequency    Min 3X/week      PT Plan Current plan remains appropriate    Co-evaluation PT/OT/SLP Co-Evaluation/Treatment: Yes Reason for Co-Treatment: Complexity of the patient's impairments (multi-system involvement);Necessary to address cognition/behavior during functional activity;For patient/therapist safety;To address functional/ADL transfers PT goals addressed during session: Mobility/safety with mobility;Balance;Proper use of DME OT goals addressed during session: ADL's and self-care      AM-PAC PT "6 Clicks" Mobility    Outcome Measure  Help needed turning from your back to your side while in a flat bed without using bedrails?: A Lot Help needed moving from lying on your back to sitting on the side of a flat bed without using bedrails?: A Lot Help needed moving to and from a bed to a chair (including a wheelchair)?: A Lot Help needed standing up from a chair using your arms (e.g., wheelchair or bedside chair)?: A Lot Help needed to walk in hospital room?: Total Help needed climbing 3-5 steps with a railing? : Total 6 Click Score: 10    End of Session Equipment Utilized During Treatment: Gait belt Activity Tolerance: Patient tolerated treatment well Patient left: in bed;with call bell/phone within reach;with bed alarm set Nurse Communication: Mobility status PT Visit Diagnosis: Unsteadiness on feet (R26.81);Muscle weakness (generalized) (M62.81);Difficulty in walking, not elsewhere classified (R26.2);Hemiplegia and hemiparesis Hemiplegia - Right/Left: Right Hemiplegia - dominant/non-dominant: Non-dominant Hemiplegia - caused by: Cerebral infarction     Time: 8185-6314 PT Time Calculation (min) (ACUTE ONLY): 65 min  Charges:  $Gait Training: 8-22 mins $Therapeutic Activity: 8-22 mins                     Conni Slipper, PT, DPT Acute Rehabilitation Services Pager: [phone] Office: [phone]    Marylynn Pearson 05/25/2019, 3:45 PM

## 2019-05-26 LAB — GLUCOSE, CAPILLARY
Glucose-Capillary: 136 mg/dL — ABNORMAL HIGH (ref 70–99)
Glucose-Capillary: 75 mg/dL (ref 70–99)
Glucose-Capillary: 127 mg/dL — ABNORMAL HIGH (ref 70–99)

## 2019-05-26 NOTE — Progress Notes (Signed)
STROKE TEAM PROGRESS NOTE   INTERVAL HISTORY Pt was sleeping when I entered room but easily arousable. No neuro changes. Pending SNF.    Vitals:   05/26/19 0336 05/26/19 0852 05/26/19 1140 05/26/19 1535  BP: 120/64 (!) 106/94 111/79 102/67  Pulse: 90 95 (!) 102 98  Resp: 16 16 16 16   Temp: 98 F (36.7 C) 98.5 F (36.9 C) 98.8 F (37.1 C) 98 F (36.7 C)  TempSrc: Oral Oral Oral Axillary  SpO2: 99% 98% 100% 98%  Weight:      Height:        CBC:  Recent Labs  Lab 05/21/19 0448 05/25/19 0538  WBC 5.5 5.8  HGB 12.3* 13.3  HCT 40.0 42.9  MCV 73.3* 73.1*  PLT 210 220    Basic Metabolic Panel:  Recent Labs  Lab 05/21/19 0448 05/25/19 0538  NA 136 137  K 4.3 4.4  CL 102 102  CO2 24 21*  GLUCOSE 163* 91  BUN 11 12  CREATININE 0.79 0.82  CALCIUM 9.6 9.7    IMAGING CT Head WO Contrast 04/16/2019 Areas of acute infarction are showing low-density and swelling but no macroscopic hemorrhage by CT. As shown by previous MRI, there is involvement of the cerebellum, left cerebral peduncle and thalamus and left posteromedial temporal lobe and occipital lobe. Tiny focus of infarction in the right occipital lobe as well.  [name] 04/15/2019 1. Acute infarction throughout the left PCA territory (including most of the left thalamus), left more than right cerebellum, and right pons. Petechial hemorrhage is seen at the level of the pons.  2. Prior left frontal infarcts and microvascular ischemic gliosis.   Cerebral angiogram 04/14/2019 4 vessel cerebral arteriogram followed by complete revascularization of occluded Lt PCA  With x 2 passes with 39mm x 56mm embotrap retriever  device achieving a TICI 3 revascularization.  Ct Code Stroke Cta Head W/wo Contrast Ct Code Stroke Cta Neck W/wo Contrast Ct Code Stroke Cta Cerebral Perfusion W/wo Contrast 04/14/2019 1. Left P2 occlusion with downstream reconstitution. Qualitative assessment of CT perfusion maps shows ischemia  without visible core infarct in the left occipital lobe.  2. Mild atherosclerosis in the neck without flow limiting stenosis or embolic source seen.  3. No large vessel occlusion in the anterior circulation.  4. Mild mid basilar narrowing.   Ct Head Code Stroke Wo Contrast 04/14/2019 1. No acute finding.  2. Small remote appearing infarcts in the left frontal lobe.    LE Doppler 04/15/2019 Right: There is no evidence of deep vein thrombosis in the lower extremity. No cystic structure found in the popliteal fossa. Left: There is no evidence of deep vein thrombosis in the lower extremity. No cystic structure found in the popliteal fossa.  2D Echocardiogram 04/15/2019  1. The left ventricle has normal systolic function, with an ejection fraction of 55-60%. The cavity size was normal. Left ventricular diastolic parameters were normal. No evidence of left ventricular regional wall motion abnormalities.  2. The right ventricle has normal systolic function. The cavity was normal. There is no increase in right ventricular wall thickness.  3. The mitral valve is abnormal. Mild thickening of the mitral valve leaflet. The MR jet is posteriorly-directed.  4. The tricuspid valve is grossly normal.  5. The aortic valve is tricuspid. No stenosis of the aortic valve.  6. The aorta is normal unless otherwise noted.  7. The inferior vena cava was normal in size with <50% respiratory variability. SUMMARY LVEF 55-60%,  normal wall thickness, normal wall motion, normal diastolic function, mild posteriorly directed MR, normal size IVC that does not collapse, no PFO by color doppler  FINDINGS  Left Ventricle: The left ventricle has normal systolic function, with an ejection fraction of 55-60%. The cavity size was normal. There is no increase in left ventricular wall thickness. Left ventricular diastolic parameters were normal. Normal left ventricular filling pressures No evidence of left ventricular regional wall  motion abnormalities..  EEG  04/15/2019 This study is suggestive of cortical dysfunction in the left hemisphere secondary to underlying stroke as well as mild diffuse encephalopathy. No seizures or definite epileptiform discharges were seen throughout the recording.  TCD w/ bubble 04/18/2019  No HTIS heard during rest. No HITS heard during valsalva.  TEE 04/19/2019 LVEF 55-60% No LA/LAA thrombus or mass No ASD or PFO by color flow Doppler or saline microcavitation study. Mild AR, mild MR.   PHYSICAL EXAM   Frail middle aged african [place] male not in distress. . Afebrile. Head is nontraumatic. Neck is supple without bruit.    Cardiac exam no murmur or gallop. Lungs are clear to auscultation. Distal pulses are well felt. Neurological Exam: awake, alert, eyes open, following simple commands. Hypophonia improving, able to have some words spoken out.  Slight dysconjugate eyes, left eye mild INO, right eye CN VI incomplete palsy, up and downward gaze palsy. PERRL. Not blinking to visual threat on the right. Right facial droop, tongue midline, LUE 5/5 but significant ataxia. LLE 4/5 at least. On pain stimulation, slight withdraw of RLE but no movement of RUE. Increased muscle tone on the RUE. Positive triple reflex on the right. Sensation and gait not tested.   ASSESSMENT/PLAN Mr. Starr SinclairSamuel Weltman is a 53 y.o. male with no significant past medical history presenting with right-sided weakness, decreased sensation on the right, diplopia and headache. Complete revascularization of occluded L PCA w/ mechanical thrombectomy.  Stroke: embolic multifocal posterior circulation infarcts with left P2 occlusion s/p IR with TICI3 reperfusion - likely embolic   Code Stroke CT head No acute abnormality. Old infarct L frontal lobe.   CTA head & neck L P2 occlusion. Mild neck atherosclerosis . No LVO anterior circulation. Mild mid BA narrowing.  CT perfusion ischemia w/o visible core  CT Head - 04/16/19 -  Areas of acute infarction are showing low-density and swelling but no macroscopic hemorrhage by CT.    Cerebral angio TICI3 reperfusion of occluded L PCA  MRI  L PCA (including thalamus), L>R cerebellum, R pontine and left midbrain infarct. Petechial hemorrhage seen in pons. Old L frontal infarcts.   LE Doppler no DVT - no evidence of deep vein thrombosis   2D Echo - EF 55-60%.  No cardiac source of emboli identified.   TCD bubble no HITS  TEE neg  UDS - positive for benzos - UDS done on 9/1  EEG - cortical dysfunction but no seizures.  LDL 88  HgbA1c 12.3  Lovenox 40 mg sq daily  for VTE prophylaxis  No antithrombotic prior to admission, finished DAPT 3-week course, now on aspirin alone   Therapy recommendations:  CIR -> D/t lack of post hospitalization family support - will pursue SNF - will need rapid covid testing prior to D/C  Medically ready for d/c when bed found  Disposition:  pending (SW trying to help family w/ placement, [place] MCD and [place])  Patient remains stable  Possible seizure  Full body rigid shaking hours post IR, L>R  Loaded with keppra  EEG - cortical dysfunction but no seizures.  Continue keppra 500mg  bid  Hypertension  No hx of HTN, on no home meds  Placed On cleviprex gtt for BP control post IR, now off . On amlodipine 10 . Now on lotensin to 10mg  bid  . BP100-130s, stable . BP goal normotensive  Hyperlipidemia  Home meds:  No statin  LDL 88, goal < 70  HDL 39  On lipitor 20  Continue statin at discharge  Diabetes type II, new diagnosis, Uncontrolled Hyperglycemia, improving  HgbA1c 12.3, goal < 7.0  CBGs  continue metformin 1000mg  bid   off glipizde to 5 mg bid d/t hypoglycemia  glipizide 2.5mg  QAC -> 2.5mg  bid -> hypoglycemia 10/6 -> back to 2.5 mg qac  DM coordinator following - should glucoses remain elevated. May benefit from SGLT-2inhibitior if he gets insurance   SSI 0-15 scale, 0-5  hs  Glucoses improved  Likely hypercoagulable state associated with uncontrolled DM  Dysphagia Malnutrition  . Secondary to stroke . Speech on board . D3 nectar thick liquids . Encourage po intake . Off IVF, lab stable . On glucerna shakes tid  UTI with fever, resolved  UA showed WBC > 50  Urine culture enterobactor and staph coag neg  Sensitive to cipro  Finished cipro 500mg  bid course (ends 9/8)  Fever resolved   Other Stroke Risk Factors   hx of stroke - small remote appearing infarcts in the left frontal lobe by CT and MRI  Other Active Problems  Microcytic anemia - iron 38, Ferritin 115 - put on iron tab bid.  Pt found on floor 04/23/19 by nursing staff. No obvious injury, complaints of pain, or neurologic changes.   Pt had 2-3 times slip off chair while sitting in chair. No fall, no injury. - now move to room 13 closer to nurse station.    Hospital day # 27    Rosalin Hawking, MD PhD Stroke Neurology 05/26/2019 4:18 PM   To contact Stroke Continuity provider, please refer to [email or web address] After hours, contact General Neurology

## 2019-05-27 LAB — GLUCOSE, CAPILLARY
Glucose-Capillary: 100 mg/dL — ABNORMAL HIGH (ref 70–99)
Glucose-Capillary: 111 mg/dL — ABNORMAL HIGH (ref 70–99)
Glucose-Capillary: 126 mg/dL — ABNORMAL HIGH (ref 70–99)
Glucose-Capillary: 110 mg/dL — ABNORMAL HIGH (ref 70–99)
Glucose-Capillary: 142 mg/dL — ABNORMAL HIGH (ref 70–99)

## 2019-05-27 MED ORDER — SODIUM CHLORIDE 0.9 % IV SOLN
INTRAVENOUS | Status: AC
  Administered 2019-05-27 (×2): via INTRAVENOUS

## 2019-05-27 NOTE — Progress Notes (Signed)
Physical Therapy Treatment Patient Details Name: Rodney SinclairSamuel Collier MRN: [id number] DOB: Apr 16, 1966 Today's Date: 05/27/2019    History of Present Illness Pt is a 53 y.o. M with no known PMH who presents with right sided weakness, decreased sensation on right, diplopia and headache. CT showing remote L frontal infarcts, CTA head/neck with L P2 occlusion, MRI with acute L PCA infarct including much of L thalamus, L > R cerebellum, R pons. Now s/p complete revascularization of occluded L PCA with mechanical thrombectomy. ETT 8/27-8/28; self extubated 8/28.    PT Comments    We were able to get pt to walk a short distance in the hallway using the hallway rail on his left, donned KI and ankle DF assist wrap for R LE stability and close chair to follow for safety.  I re-checked his R shoulder and it does not appear to be subluxed, so sometime in the future once he has better control, we could likely try some form of platform walker to assist with gait.  He continues to be appropriate for post acute rehab.  PT will continue to follow acutely for safe mobility progression  Follow Up Recommendations  SNF     Equipment Recommendations  Wheelchair (measurements PT);Wheelchair cushion (measurements PT);3in1 (PT);Other (comment)(one arm drive [id number] WC)    Recommendations for Other Services   NA     Precautions / Restrictions Precautions Precautions: Fall Precaution Comments: R hemiparesis and inattention.    Mobility  Bed Mobility Overal bed mobility: Needs Assistance Bed Mobility: Supine to Sit Rolling: Min assist   Supine to sit: Mod assist     General bed mobility comments: Min assist to roll to remove wet undergarment, mod assist to stabilize bil LEs so pt can bridge to donn new undergarment, mod assist mostly to support trunk to come to sitting EOB and assist in progressing R LE over EOB.  Taught pt later to hook R LE to help move it, would be worth trying for bed mobility next session.    Transfers Overall transfer level: Needs assistance Equipment used: None Transfers: Sit to/from [employer] CorporationStand;Stand Pivot Transfers Sit to Stand: +2 physical assistance;Mod assist Stand pivot transfers: +2 physical assistance;Mod assist       General transfer comment: Two person mod assist to stand and pivot to the recliner chair on his left.  Pt reaching impulsively to the back of the chair and dove over to sitting surface.   Ambulation/Gait Ambulation/Gait assistance: +2 safety/equipment;Mod assist Gait Distance (Feet): 15 Feet Assistive device: (Hallway rail) Gait Pattern/deviations: Step-to pattern;Decreased dorsiflexion - right;Decreased weight shift to right Gait velocity: Decreased Gait velocity interpretation: <1.31 ft/sec, indicative of household ambulator General Gait Details: Donned ankle DF assist wrap and KI on R LE, wheeled recliner to the hallway and used hallway railing and close chair to follow to assist pt in gait activities.  Pt with difficulty progressing his right foot forward, so therapist had to manually assist with this and still stabilize the right knee during stance.  Step by step instruction given to pt, narrow base of support, which he had difficulty fixing with cues.         Modified Rankin (Stroke Patients Only) Modified Rankin (Stroke Patients Only) Pre-Morbid Rankin Score: No symptoms Modified Rankin: Moderately severe disability     Balance Overall balance assessment: Needs assistance Sitting-balance support: Feet supported;Single extremity supported Sitting balance-Leahy Scale: Poor Sitting balance - Comments: at times min assist needed for trunk balance when he would release his left hand from  the bed and reach for something.   Postural control: Left lateral lean(because he was reaching left. ) Standing balance support: Single extremity supported Standing balance-Leahy Scale: Poor Standing balance comment: needs mod two person physical assist in  standing, practiced multiple standing trials, weight shfits left and right with right knee blocked and transitions up/down.                             Cognition Arousal/Alertness: Awake/alert Behavior During Therapy: Impulsive Overall Cognitive Status: Impaired/Different from baseline Area of Impairment: Safety/judgement;Awareness;Problem solving;Following commands                   Current Attention Level: Selective   Following Commands: Follows one step commands consistently(on his left side) Safety/Judgement: Decreased awareness of safety;Decreased awareness of deficits Awareness: Emergent Problem Solving: Difficulty sequencing;Requires verbal cues;Requires tactile cues General Comments: Speaks [place] and [place] throughout session (he is from [place] and speaks [place]), remains impulsive, quick to move and fall over while attempting something (sitting, reaching) without warning.  Assist needed with safety and to remember safety precautions while eating (he had not had breakfast yet).          General Comments General comments (skin integrity, edema, etc.): Pt had not eaten breakfast yet and we were waiting on the KI to arrive from ortho tech, so we assisted pt in eating breakfast following SLP posted precautions.  He needed most assist with ensuring that his bites were small, we checked regularly for pocketing, and he did have some coughing at the end of eating (maybe the grits ?).  He was able to self feet with L hand with spoon for most items with safety cues.        Pertinent Vitals/Pain Pain Assessment: No/denies pain           PT Goals (current goals can now be found in the care plan section) Acute Rehab PT Goals Patient Stated Goal: to walk, speak Progress towards PT goals: Progressing toward goals    Frequency    Min 3X/week      PT Plan Current plan remains appropriate       AM-PAC PT "6 Clicks" Mobility   Outcome Measure  Help  needed turning from your back to your side while in a flat bed without using bedrails?: A Little Help needed moving from lying on your back to sitting on the side of a flat bed without using bedrails?: A Lot Help needed moving to and from a bed to a chair (including a wheelchair)?: A Lot Help needed standing up from a chair using your arms (e.g., wheelchair or bedside chair)?: A Lot Help needed to walk in hospital room?: A Lot Help needed climbing 3-5 steps with a railing? : Total 6 Click Score: 12    End of Session Equipment Utilized During Treatment: Gait belt Activity Tolerance: Patient tolerated treatment well Patient left: in chair;with call bell/phone within reach;with chair alarm set Nurse Communication: Mobility status PT Visit Diagnosis: Unsteadiness on feet (R26.81);Muscle weakness (generalized) (M62.81);Difficulty in walking, not elsewhere classified (R26.2);Hemiplegia and hemiparesis Hemiplegia - Right/Left: Right Hemiplegia - dominant/non-dominant: Non-dominant Hemiplegia - caused by: Cerebral infarction     Time: 0935-1041(less two units for eating and waiting on KI) PT Time Calculation (min) (ACUTE ONLY): 66 min  Charges:  $Gait Training: 8-22 mins $Therapeutic Activity: 8-22 mins            Terryl Molinelli B. Samarra Ridgely, PT,  DPT  Acute Rehabilitation #[phone] pager #[phone] office  @ [facility]: [phone]            05/27/2019, 11:10 AM

## 2019-05-27 NOTE — Progress Notes (Addendum)
STROKE TEAM PROGRESS NOTE   INTERVAL HISTORY Pt lying in bed, awake alert and interactive. Has been going out of unit for the last 2 days with PT/OT. No neuro changes. Pending SNF. Had decreased UOP overnight, he was put on IVF @ 100 for 24 hours. This am he has adequate UOP.   Vitals:   05/27/19 0015 05/27/19 0328 05/27/19 0727 05/27/19 1309  BP: 120/76 104/70 109/79 116/70  Pulse: 89 96 91 93  Resp: 18 18 16 17   Temp: 98.2 F (36.8 C) 98.1 F (36.7 C) 97.7 F (36.5 C) 97.8 F (36.6 C)  TempSrc: Oral Oral Oral Oral  SpO2: 99% 99% 100% 100%  Weight:      Height:        CBC:  Recent Labs  Lab 05/21/19 0448 05/25/19 0538  WBC 5.5 5.8  HGB 12.3* 13.3  HCT 40.0 42.9  MCV 73.3* 73.1*  PLT 210 220    Basic Metabolic Panel:  Recent Labs  Lab 05/21/19 0448 05/25/19 0538  NA 136 137  K 4.3 4.4  CL 102 102  CO2 24 21*  GLUCOSE 163* 91  BUN 11 12  CREATININE 0.79 0.82  CALCIUM 9.6 9.7    IMAGING CT Head WO Contrast 04/16/2019 Areas of acute infarction are showing low-density and swelling but no macroscopic hemorrhage by CT. As shown by previous MRI, there is involvement of the cerebellum, left cerebral peduncle and thalamus and left posteromedial temporal lobe and occipital lobe. Tiny focus of infarction in the right occipital lobe as well.  [name] 04/15/2019 1. Acute infarction throughout the left PCA territory (including most of the left thalamus), left more than right cerebellum, and right pons. Petechial hemorrhage is seen at the level of the pons.  2. Prior left frontal infarcts and microvascular ischemic gliosis.   Cerebral angiogram 04/14/2019 4 vessel cerebral arteriogram followed by complete revascularization of occluded Lt PCA  With x 2 passes with 5mm x 33mm embotrap retriever  device achieving a TICI 3 revascularization.  Ct Code Stroke Cta Head W/wo Contrast Ct Code Stroke Cta Neck W/wo Contrast Ct Code Stroke Cta Cerebral Perfusion W/wo  Contrast 04/14/2019 1. Left P2 occlusion with downstream reconstitution. Qualitative assessment of CT perfusion maps shows ischemia without visible core infarct in the left occipital lobe.  2. Mild atherosclerosis in the neck without flow limiting stenosis or embolic source seen.  3. No large vessel occlusion in the anterior circulation.  4. Mild mid basilar narrowing.   Ct Head Code Stroke Wo Contrast 04/14/2019 1. No acute finding.  2. Small remote appearing infarcts in the left frontal lobe.    LE Doppler 04/15/2019 Right: There is no evidence of deep vein thrombosis in the lower extremity. No cystic structure found in the popliteal fossa. Left: There is no evidence of deep vein thrombosis in the lower extremity. No cystic structure found in the popliteal fossa.  2D Echocardiogram 04/15/2019  1. The left ventricle has normal systolic function, with an ejection fraction of 55-60%. The cavity size was normal. Left ventricular diastolic parameters were normal. No evidence of left ventricular regional wall motion abnormalities.  2. The right ventricle has normal systolic function. The cavity was normal. There is no increase in right ventricular wall thickness.  3. The mitral valve is abnormal. Mild thickening of the mitral valve leaflet. The MR jet is posteriorly-directed.  4. The tricuspid valve is grossly normal.  5. The aortic valve is tricuspid. No stenosis of the  aortic valve.  6. The aorta is normal unless otherwise noted.  7. The inferior vena cava was normal in size with <50% respiratory variability. SUMMARY LVEF 55-60%, normal wall thickness, normal wall motion, normal diastolic function, mild posteriorly directed MR, normal size IVC that does not collapse, no PFO by color doppler  FINDINGS  Left Ventricle: The left ventricle has normal systolic function, with an ejection fraction of 55-60%. The cavity size was normal. There is no increase in left ventricular wall thickness. Left  ventricular diastolic parameters were normal. Normal left ventricular filling pressures No evidence of left ventricular regional wall motion abnormalities..  EEG  04/15/2019 This study is suggestive of cortical dysfunction in the left hemisphere secondary to underlying stroke as well as mild diffuse encephalopathy. No seizures or definite epileptiform discharges were seen throughout the recording.  TCD w/ bubble 04/18/2019  No HTIS heard during rest. No HITS heard during valsalva.  TEE 04/19/2019 LVEF 55-60% No LA/LAA thrombus or mass No ASD or PFO by color flow Doppler or saline microcavitation study. Mild AR, mild MR.   PHYSICAL EXAM   Frail middle aged african [place] male not in distress. . Afebrile. Head is nontraumatic. Neck is supple without bruit.    Cardiac exam no murmur or gallop. Lungs are clear to auscultation. Distal pulses are well felt. Neurological Exam: awake, alert, eyes open, following simple commands. nonfluent language but able to have some words articulated, hypophonia much improved.  Slight dysconjugate eyes, left eye mild INO, right eye CN VI incomplete palsy, up and downward gaze palsy. PERRL. Not blinking to visual threat on the right. Right facial droop, tongue midline, LUE 5/5 but significant ataxia. LLE 4/5 at least. On pain stimulation, slight withdraw of RLE but no movement of RUE. Increased muscle tone on the RUE. Positive triple reflex on the right. Sensation and gait not tested.   ASSESSMENT/PLAN Rodney Collier is a 53 y.o. male with no significant past medical history presenting with right-sided weakness, decreased sensation on the right, diplopia and headache. Complete revascularization of occluded L PCA w/ mechanical thrombectomy.  Stroke: embolic multifocal posterior circulation infarcts with left P2 occlusion s/p IR with TICI3 reperfusion - likely embolic   Code Stroke CT head No acute abnormality. Old infarct L frontal lobe.   CTA head & neck L  P2 occlusion. Mild neck atherosclerosis . No LVO anterior circulation. Mild mid BA narrowing.  CT perfusion ischemia w/o visible core  CT Head - 04/16/19 - Areas of acute infarction are showing low-density and swelling but no macroscopic hemorrhage by CT.    Cerebral angio TICI3 reperfusion of occluded L PCA  MRI  L PCA (including thalamus), L>R cerebellum, R pontine and left midbrain infarct. Petechial hemorrhage seen in pons. Old L frontal infarcts.   LE Doppler no DVT - no evidence of deep vein thrombosis   2D Echo - EF 55-60%.  No cardiac source of emboli identified.   TCD bubble no HITS  TEE neg  UDS - positive for benzos - UDS done on 9/1  EEG - cortical dysfunction but no seizures.  LDL 88  HgbA1c 12.3  Lovenox 40 mg sq daily  for VTE prophylaxis  No antithrombotic prior to admission, finished DAPT 3-week course, now on aspirin alone   Therapy recommendations:  CIR -> D/t lack of post hospitalization family support - will pursue SNF - will need rapid covid testing prior to D/C  Medically ready for d/c when bed found  Disposition:  pending (  SW trying to help family w/ placement, MA MCD and transfer back to [place])  Patient remains stable  Possible seizure  Full body rigid shaking hours post IR, L>R  Loaded with keppra  EEG - cortical dysfunction but no seizures.  Continue keppra 500mg  bid  Hypertension  No hx of HTN, on no home meds  Placed On cleviprex gtt for BP control post IR, now off . Discontinue amlodipine 10 . Now on lotensin to 10mg  bid  . BP stable . BP goal normotensive  Hyperlipidemia  Home meds:  No statin  LDL 88, goal < 70  HDL 39  On lipitor 20  Continue statin at discharge  Diabetes type II, new diagnosis, Uncontrolled Hyperglycemia, improving  HgbA1c 12.3, goal < 7.0  CBGs  continue metformin 1000mg  bid   off glipizde to 5 mg bid d/t hypoglycemia  glipizide 2.5mg  QAC -> 2.5mg  bid -> hypoglycemia 10/6 -> back to  2.5 mg qac  DM coordinator following - should glucoses remain elevated. May benefit from SGLT-2inhibitior if he gets insurance   SSI 0-15 scale, 0-5 hs  Likely hypercoagulable state associated with uncontrolled DM  Dysphagia Malnutrition  . Secondary to stroke . Speech on board . D3 nectar thick liquids . Encourage po intake . Off IVF, lab stable . On glucerna shakes tid  UTI with fever, resolved  UA showed WBC > 50  Urine culture enterobactor and staph coag neg  Sensitive to cipro  Finished cipro 500mg  bid course (ends 9/8)  Fever resolved   Other Stroke Risk Factors   hx of stroke - small remote appearing infarcts in the left frontal lobe by CT and MRI  Other Active Problems  Microcytic anemia - iron 38, Ferritin 115 - put on iron tab bid.  Pt found on floor 04/23/19 by nursing staff. No obvious injury, complaints of pain, or neurologic changes.   Pt had 2-3 times slip off chair while sitting in chair. No fall, no injury. - now move to room 13 closer to nurse station.    Pt had decreased UOP 10/8, put on IVF @ 100 for 24 hours. - lab pending in am  Hospital day # [place], MD PhD Stroke Neurology 05/27/2019 1:30 PM   To contact Stroke Continuity provider, please refer to [email or web address] After hours, contact General Neurology

## 2019-05-27 NOTE — Progress Notes (Signed)
Orthopedic Tech Progress Note Patient Details:  Rodney Collier 07/03/1966 [id number]  Ortho Devices Type of Ortho Device: Knee Immobilizer Ortho Device/Splint Location: replacement knee Immobilizer Ortho Device/Splint Interventions: Other (comment)   Post Interventions Patient Tolerated: Well Instructions Provided: Care of device   [name] 05/27/2019, 10:47 AM

## 2019-05-27 NOTE — Progress Notes (Signed)
  Speech Language Pathology Treatment: Dysphagia  Patient Details Name: Rodney Collier MRN: [id number] DOB: October 15, 1965 Today's Date: 05/27/2019 Time: 1218-1226 SLP Time Calculation (min) (ACUTE ONLY): 8 min  Assessment / Plan / Recommendation Clinical Impression  Skilled treatment session focused on dysphagia. SLP provided skilled observation of pt consuming nectar thick liquids via cup. Pt with mild anterior spillage but with Min A support, he was able to position cup to prevent spillage. Pt declined further trials and was perseverative on call bell. Pt is appropriate for ST services 1 x week. Education provided to pt's nurse who is in agreement with POC.    HPI HPI: Pt is a 53 y.o. M with no known PMH who presents with right sided weakness, decreased sensation on right, diplopia and headache. CT showing remote L frontal infarcts, CTA head/neck with L P2 occlusion, MRI with acute L PCA infarct including much of L thalamus, L > R cerebellum, R pons. Now s/p complete revascularization of occluded L PCA with mechanical thrombectomy. ETT 8/27-8/28; self extubated 8/28.      SLP Plan  Continue with current plan of care       Recommendations  Diet recommendations: Dysphagia 3 (mechanical soft);Nectar-thick liquid Liquids provided via: Cup;Straw Medication Administration: Whole meds with puree Supervision: Patient able to self feed;Full supervision/cueing for compensatory strategies Compensations: Minimize environmental distractions;Small sips/bites;Slow rate;Lingual sweep for clearance of pocketing Postural Changes and/or Swallow Maneuvers: Seated upright 90 degrees;Upright 30-60 min after meal                Oral Care Recommendations: (brush teeth after PO intake) Follow up Recommendations: Skilled Nursing facility SLP Visit Diagnosis: Aphasia (R47.01);Apraxia (R48.2);Dysphagia, oropharyngeal phase (R13.12) Plan: Continue with current plan of care       [place] 05/27/2019, 3:39 PM

## 2019-05-28 LAB — CBC
HCT: 36 % — ABNORMAL LOW (ref 39.0–52.0)
Hemoglobin: 11.6 g/dL — ABNORMAL LOW (ref 13.0–17.0)
MCH: 23.4 pg — ABNORMAL LOW (ref 26.0–34.0)
MCHC: 32.2 g/dL (ref 30.0–36.0)
MCV: 72.6 fL — ABNORMAL LOW (ref 80.0–100.0)
Platelets: 236 10*3/uL (ref 150–400)
RBC: 4.96 MIL/uL (ref 4.22–5.81)
RDW: 14 % (ref 11.5–15.5)
WBC: 5.6 10*3/uL (ref 4.0–10.5)
nRBC: 0 % (ref 0.0–0.2)

## 2019-05-28 LAB — GLUCOSE, CAPILLARY
Glucose-Capillary: 114 mg/dL — ABNORMAL HIGH (ref 70–99)
Glucose-Capillary: 144 mg/dL — ABNORMAL HIGH (ref 70–99)
Glucose-Capillary: 132 mg/dL — ABNORMAL HIGH (ref 70–99)
Glucose-Capillary: 131 mg/dL — ABNORMAL HIGH (ref 70–99)

## 2019-05-28 LAB — COMPREHENSIVE METABOLIC PANEL
ALT: 29 U/L (ref 0–44)
AST: 20 U/L (ref 15–41)
Albumin: 3.2 g/dL — ABNORMAL LOW (ref 3.5–5.0)
Alkaline Phosphatase: 59 U/L (ref 38–126)
Anion gap: 9 (ref 5–15)
BUN: 9 mg/dL (ref 6–20)
CO2: 24 mmol/L (ref 22–32)
Calcium: 9.5 mg/dL (ref 8.9–10.3)
Chloride: 103 mmol/L (ref 98–111)
Creatinine, Ser: 0.73 mg/dL (ref 0.61–1.24)
GFR calc Af Amer: >60 mL/min (ref >60)
GFR calc non Af Amer: >60 mL/min (ref >60)
Glucose, Bld: 133 mg/dL — ABNORMAL HIGH (ref 70–99)
Potassium: 4.3 mmol/L (ref 3.5–5.1)
Sodium: 136 mmol/L (ref 135–145)
Total Bilirubin: 0.6 mg/dL (ref 0.3–1.2)
Total Protein: 6.5 g/dL (ref 6.5–8.1)

## 2019-05-28 NOTE — Progress Notes (Signed)
STROKE TEAM PROGRESS NOTE   INTERVAL HISTORY Pt lying in bed, awake alert and interactive. . No neuro changes. Pending SNF. Vitals stable. CBC, CMP  ok.l  Vitals:   05/27/19 1737 05/27/19 2023 05/28/19 0004 05/28/19 0318  BP: 130/79 119/74 127/85 (!) 127/93  Pulse: 93 96 84 86  Resp: 18 20 18    Temp: 98 F (36.7 C) 97.9 F (36.6 C) 97.7 F (36.5 C) (!) 97.5 F (36.4 C)  TempSrc: Oral Oral Oral Oral  SpO2: 98% 99% 100% 100%  Weight:      Height:        CBC:  Recent Labs  Lab 05/25/19 0538 05/28/19 0554  WBC 5.8 5.6  HGB 13.3 11.6*  HCT 42.9 36.0*  MCV 73.1* 72.6*  PLT 220 696    Basic Metabolic Panel:  Recent Labs  Lab 05/25/19 0538 05/28/19 0554  NA 137 136  K 4.4 4.3  CL 102 103  CO2 21* 24  GLUCOSE 91 133*  BUN 12 9  CREATININE 0.82 0.73  CALCIUM 9.7 9.5    IMAGING CT Head WO Contrast 04/16/2019 Areas of acute infarction are showing low-density and swelling but no macroscopic hemorrhage by CT. As shown by previous MRI, there is involvement of the cerebellum, left cerebral peduncle and thalamus and left posteromedial temporal lobe and occipital lobe. Tiny focus of infarction in the right occipital lobe as well.  [name] 04/15/2019 1. Acute infarction throughout the left PCA territory (including most of the left thalamus), left more than right cerebellum, and right pons. Petechial hemorrhage is seen at the level of the pons.  2. Prior left frontal infarcts and microvascular ischemic gliosis.   Cerebral angiogram 04/14/2019 4 vessel cerebral arteriogram followed by complete revascularization of occluded Lt PCA  With x 2 passes with 3mm x 14mm embotrap retriever  device achieving a TICI 3 revascularization.  Ct Code Stroke Cta Head W/wo Contrast Ct Code Stroke Cta Neck W/wo Contrast Ct Code Stroke Cta Cerebral Perfusion W/wo Contrast 04/14/2019 1. Left P2 occlusion with downstream reconstitution. Qualitative assessment of CT perfusion maps  shows ischemia without visible core infarct in the left occipital lobe.  2. Mild atherosclerosis in the neck without flow limiting stenosis or embolic source seen.  3. No large vessel occlusion in the anterior circulation.  4. Mild mid basilar narrowing.   Ct Head Code Stroke Wo Contrast 04/14/2019 1. No acute finding.  2. Small remote appearing infarcts in the left frontal lobe.    LE Doppler 04/15/2019 Right: There is no evidence of deep vein thrombosis in the lower extremity. No cystic structure found in the popliteal fossa. Left: There is no evidence of deep vein thrombosis in the lower extremity. No cystic structure found in the popliteal fossa.  2D Echocardiogram 04/15/2019  1. The left ventricle has normal systolic function, with an ejection fraction of 55-60%. The cavity size was normal. Left ventricular diastolic parameters were normal. No evidence of left ventricular regional wall motion abnormalities.  2. The right ventricle has normal systolic function. The cavity was normal. There is no increase in right ventricular wall thickness.  3. The mitral valve is abnormal. Mild thickening of the mitral valve leaflet. The MR jet is posteriorly-directed.  4. The tricuspid valve is grossly normal.  5. The aortic valve is tricuspid. No stenosis of the aortic valve.  6. The aorta is normal unless otherwise noted.  7. The inferior vena cava was normal in size with <50% respiratory variability.  SUMMARY LVEF 55-60%, normal wall thickness, normal wall motion, normal diastolic function, mild posteriorly directed MR, normal size IVC that does not collapse, no PFO by color doppler  FINDINGS  Left Ventricle: The left ventricle has normal systolic function, with an ejection fraction of 55-60%. The cavity size was normal. There is no increase in left ventricular wall thickness. Left ventricular diastolic parameters were normal. Normal left ventricular filling pressures No evidence of left ventricular  regional wall motion abnormalities..  EEG  04/15/2019 This study is suggestive of cortical dysfunction in the left hemisphere secondary to underlying stroke as well as mild diffuse encephalopathy. No seizures or definite epileptiform discharges were seen throughout the recording.  TCD w/ bubble 04/18/2019  No HTIS heard during rest. No HITS heard during valsalva.  TEE 04/19/2019 LVEF 55-60% No LA/LAA thrombus or mass No ASD or PFO by color flow Doppler or saline microcavitation study. Mild AR, mild MR.   PHYSICAL EXAM   Frail middle aged african [place] male not in distress. . Afebrile. Head is nontraumatic. Neck is supple without bruit.    Cardiac exam no murmur or gallop. Lungs are clear to auscultation. Distal pulses are well felt. Neurological Exam: awake, alert, eyes open, following simple commands. nonfluent language but able to have some words articulated, hypophonia much improved.  Slight dysconjugate eyes, left eye mild INO, right eye CN VI incomplete palsy, up and downward gaze palsy. PERRL. Not blinking to visual threat on the right.Rt hemianopia. Right facial droop, tongue midline, LUE 5/5 but significant ataxia. LLE 4/5 at least. On pain stimulation, slight withdraw of RLE but no movement of RUE. Increased muscle tone on the RUE. Positive triple reflex on the right. Sensation and gait not tested.   ASSESSMENT/PLAN [name] with no significant past medical history presenting with right-sided weakness, decreased sensation on the right, diplopia and headache. Complete revascularization of occluded L PCA w/ mechanical thrombectomy.  Stroke: embolic multifocal posterior circulation infarcts with left P2 occlusion s/p IR with TICI3 reperfusion - likely embolic   Code Stroke CT head No acute abnormality. Old infarct L frontal lobe.   CTA head & neck L P2 occlusion. Mild neck atherosclerosis . No LVO anterior circulation. Mild mid BA narrowing.  CT  perfusion ischemia w/o visible core  CT Head - 04/16/19 - Areas of acute infarction are showing low-density and swelling but no macroscopic hemorrhage by CT.    Cerebral angio TICI3 reperfusion of occluded L PCA  MRI  L PCA (including thalamus), L>R cerebellum, R pontine and left midbrain infarct. Petechial hemorrhage seen in pons. Old L frontal infarcts.   LE Doppler no DVT - no evidence of deep vein thrombosis   2D Echo - EF 55-60%.  No cardiac source of emboli identified.   TCD bubble no HITS  TEE neg  UDS - positive for benzos - UDS done on 9/1  EEG - cortical dysfunction but no seizures.  LDL 88  HgbA1c 12.3  Lovenox 40 mg sq daily  for VTE prophylaxis  No antithrombotic prior to admission, finished DAPT 3-week course, now on aspirin alone   Therapy recommendations:  CIR -> D/t lack of post hospitalization family support - will pursue SNF - will need rapid covid testing prior to D/C  Medically ready for d/c when bed found  Disposition:  pending (SW trying to help family w/ placement, MA MCD and transfer back to [place])  Patient remains stable  Possible seizure  Full body  rigid shaking hours post IR, L>R  Loaded with keppra  EEG - cortical dysfunction but no seizures.  Continue keppra 500mg  bid  Hypertension  No hx of HTN, on no home meds  Placed On cleviprex gtt for BP control post IR, now off . Discontinue amlodipine 10 . Now on lotensin to 10mg  bid  . BP stable . BP goal normotensive  Hyperlipidemia  Home meds:  No statin  LDL 88, goal < 70  HDL 39  On lipitor 20  Continue statin at discharge  Diabetes type II, new diagnosis, Uncontrolled Hyperglycemia, improving  HgbA1c 12.3, goal < 7.0  CBGs  continue metformin 1000mg  bid   off glipizde to 5 mg bid d/t hypoglycemia  glipizide 2.5mg  QAC -> 2.5mg  bid -> hypoglycemia 10/6 -> back to 2.5 mg qac  DM coordinator following - should glucoses remain elevated. May benefit from  SGLT-2inhibitior if he gets insurance   SSI 0-15 scale, 0-5 hs  Likely hypercoagulable state associated with uncontrolled DM  Dysphagia Malnutrition  . Secondary to stroke . Speech on board . D3 nectar thick liquids . Encourage po intake . Off IVF, lab stable . On glucerna shakes tid  UTI with fever, resolved  UA showed WBC > 50  Urine culture enterobactor and staph coag neg  Sensitive to cipro  Finished cipro 500mg  bid course (ends 9/8)  Fever resolved   Other Stroke Risk Factors   hx of stroke - small remote appearing infarcts in the left frontal lobe by CT and MRI  Other Active Problems  Microcytic anemia - iron 38, Ferritin 115 - put on iron tab bid.  Pt found on floor 04/23/19 by nursing staff. No obvious injury, complaints of pain, or neurologic changes.   Pt had 2-3 times slip off chair while sitting in chair. No fall, no injury. - now move to room 13 closer to nurse station.    Pt had decreased UOP 10/8, put on IVF @ 100 for 24 hours. - labs ok 10/10/20n am  Hospital day # [id number]  Continue present management. Await rehab bed. Delia HeadyPramod Wanda Rideout, MD  To contact Stroke Continuity provider, please refer to [email or web address]. After hours, contact General Neurology

## 2019-05-29 LAB — GLUCOSE, CAPILLARY
Glucose-Capillary: 142 mg/dL — ABNORMAL HIGH (ref 70–99)
Glucose-Capillary: 64 mg/dL — ABNORMAL LOW (ref 70–99)
Glucose-Capillary: 131 mg/dL — ABNORMAL HIGH (ref 70–99)
Glucose-Capillary: 142 mg/dL — ABNORMAL HIGH (ref 70–99)

## 2019-05-29 NOTE — Progress Notes (Signed)
STROKE TEAM PROGRESS NOTE   INTERVAL HISTORY Pt lying in bed, awake alert and interactive. . No neuro changes. Pending SNF. Vitals stable.    Vitals:   05/28/19 2326 05/29/19 0330 05/29/19 0739 05/29/19 1201  BP: 133/81 118/87 115/78 122/78  Pulse: 98 86 81 87  Resp: 15 15 16 20   Temp: 98.3 F (36.8 C) 97.8 F (36.6 C) 98.2 F (36.8 C) 98.8 F (37.1 C)  TempSrc: Oral Oral Oral Oral  SpO2: 99% 100% 98% 100%  Weight:      Height:        CBC:  Recent Labs  Lab 05/25/19 0538 05/28/19 0554  WBC 5.8 5.6  HGB 13.3 11.6*  HCT 42.9 36.0*  MCV 73.1* 72.6*  PLT 220 921    Basic Metabolic Panel:  Recent Labs  Lab 05/25/19 0538 05/28/19 0554  NA 137 136  K 4.4 4.3  CL 102 103  CO2 21* 24  GLUCOSE 91 133*  BUN 12 9  CREATININE 0.82 0.73  CALCIUM 9.7 9.5    IMAGING CT Head WO Contrast 04/16/2019 Areas of acute infarction are showing low-density and swelling but no macroscopic hemorrhage by CT. As shown by previous MRI, there is involvement of the cerebellum, left cerebral peduncle and thalamus and left posteromedial temporal lobe and occipital lobe. Tiny focus of infarction in the right occipital lobe as well.  [name] 04/15/2019 1. Acute infarction throughout the left PCA territory (including most of the left thalamus), left more than right cerebellum, and right pons. Petechial hemorrhage is seen at the level of the pons.  2. Prior left frontal infarcts and microvascular ischemic gliosis.   Cerebral angiogram 04/14/2019 4 vessel cerebral arteriogram followed by complete revascularization of occluded Lt PCA  With x 2 passes with 44mm x 72mm embotrap retriever  device achieving a TICI 3 revascularization.  Ct Code Stroke Cta Head W/wo Contrast Ct Code Stroke Cta Neck W/wo Contrast Ct Code Stroke Cta Cerebral Perfusion W/wo Contrast 04/14/2019 1. Left P2 occlusion with downstream reconstitution. Qualitative assessment of CT perfusion maps shows ischemia  without visible core infarct in the left occipital lobe.  2. Mild atherosclerosis in the neck without flow limiting stenosis or embolic source seen.  3. No large vessel occlusion in the anterior circulation.  4. Mild mid basilar narrowing.   Ct Head Code Stroke Wo Contrast 04/14/2019 1. No acute finding.  2. Small remote appearing infarcts in the left frontal lobe.    LE Doppler 04/15/2019 Right: There is no evidence of deep vein thrombosis in the lower extremity. No cystic structure found in the popliteal fossa. Left: There is no evidence of deep vein thrombosis in the lower extremity. No cystic structure found in the popliteal fossa.  2D Echocardiogram 04/15/2019  1. The left ventricle has normal systolic function, with an ejection fraction of 55-60%. The cavity size was normal. Left ventricular diastolic parameters were normal. No evidence of left ventricular regional wall motion abnormalities.  2. The right ventricle has normal systolic function. The cavity was normal. There is no increase in right ventricular wall thickness.  3. The mitral valve is abnormal. Mild thickening of the mitral valve leaflet. The MR jet is posteriorly-directed.  4. The tricuspid valve is grossly normal.  5. The aortic valve is tricuspid. No stenosis of the aortic valve.  6. The aorta is normal unless otherwise noted.  7. The inferior vena cava was normal in size with <50% respiratory variability. SUMMARY LVEF 55-60%, normal  wall thickness, normal wall motion, normal diastolic function, mild posteriorly directed MR, normal size IVC that does not collapse, no PFO by color doppler  FINDINGS  Left Ventricle: The left ventricle has normal systolic function, with an ejection fraction of 55-60%. The cavity size was normal. There is no increase in left ventricular wall thickness. Left ventricular diastolic parameters were normal. Normal left ventricular filling pressures No evidence of left ventricular regional wall  motion abnormalities..  EEG  04/15/2019 This study is suggestive of cortical dysfunction in the left hemisphere secondary to underlying stroke as well as mild diffuse encephalopathy. No seizures or definite epileptiform discharges were seen throughout the recording.  TCD w/ bubble 04/18/2019  No HTIS heard during rest. No HITS heard during valsalva.  TEE 04/19/2019 LVEF 55-60% No LA/LAA thrombus or mass No ASD or PFO by color flow Doppler or saline microcavitation study. Mild AR, mild MR.   PHYSICAL EXAM   Frail middle aged african [place] male not in distress. . Afebrile. Head is nontraumatic. Neck is supple without bruit.    Cardiac exam no murmur or gallop. Lungs are clear to auscultation. Distal pulses are well felt. Neurological Exam: awake, alert, eyes open, following simple commands. nonfluent language but able to have some words articulated, hypophonia much improved.  Slight dysconjugate eyes, left eye mild INO, right eye CN VI incomplete palsy, up and downward gaze palsy. PERRL. Not blinking to visual threat on the right.Rt hemianopia. Right facial droop, tongue midline, LUE 5/5 but significant ataxia. LLE 4/5 at least. On pain stimulation, slight withdraw of RLE but no movement of RUE. Increased muscle tone on the RUE. Positive triple reflex on the right. Sensation and gait not tested.   ASSESSMENT/PLAN [name] with no significant past medical history presenting with right-sided weakness, decreased sensation on the right, diplopia and headache. Complete revascularization of occluded L PCA w/ mechanical thrombectomy.  Stroke: embolic multifocal posterior circulation infarcts with left P2 occlusion s/p IR with TICI3 reperfusion - likely embolic   Code Stroke CT head No acute abnormality. Old infarct L frontal lobe.   CTA head & neck L P2 occlusion. Mild neck atherosclerosis . No LVO anterior circulation. Mild mid BA narrowing.  CT perfusion ischemia  w/o visible core  CT Head - 04/16/19 - Areas of acute infarction are showing low-density and swelling but no macroscopic hemorrhage by CT.    Cerebral angio TICI3 reperfusion of occluded L PCA  MRI  L PCA (including thalamus), L>R cerebellum, R pontine and left midbrain infarct. Petechial hemorrhage seen in pons. Old L frontal infarcts.   LE Doppler no DVT - no evidence of deep vein thrombosis   2D Echo - EF 55-60%.  No cardiac source of emboli identified.   TCD bubble no HITS  TEE neg  UDS - positive for benzos - UDS done on 9/1  EEG - cortical dysfunction but no seizures.  LDL 88  HgbA1c 12.3  Lovenox 40 mg sq daily  for VTE prophylaxis  No antithrombotic prior to admission, finished DAPT 3-week course, now on aspirin alone   Therapy recommendations:  CIR -> D/t lack of post hospitalization family support - will pursue SNF - will need rapid covid testing prior to D/C  Medically ready for d/c when bed found  Disposition:  pending (SW trying to help family w/ placement, MA MCD and transfer back to [place])  Patient remains stable  Possible seizure  Full body rigid shaking hours post  IR, L>R  Loaded with keppra  EEG - cortical dysfunction but no seizures.  Continue keppra 500mg  bid  Hypertension  No hx of HTN, on no home meds  Placed On cleviprex gtt for BP control post IR, now off . Discontinue amlodipine 10 . Now on lotensin to 10mg  bid  . BP stable . BP goal normotensive  Hyperlipidemia  Home meds:  No statin  LDL 88, goal < 70  HDL 39  On lipitor 20  Continue statin at discharge  Diabetes type II, new diagnosis, Uncontrolled Hyperglycemia, improving  HgbA1c 12.3, goal < 7.0  CBGs  continue metformin 1000mg  bid   off glipizde to 5 mg bid d/t hypoglycemia  glipizide 2.5mg  QA C -> 2.5mg  bid -> hypoglycemia 10/6 -> back to 2.5 mg qac  DM coordinator following - should glucoses remain elevated. May benefit from SGLT-2inhibitior if he gets  insurance   SSI 0-15 scale, 0-5 hs  Likely hypercoagulable state associated with uncontrolled DM  Dysphagia Malnutrition  . Secondary to stroke . Speech on board . D3 nectar thick liquids . Encourage po intake . Off IVF, lab stable . On glucerna shakes tid  UTI with fever, resolved  UA showed WBC > 50  Urine culture enterobactor and staph coag neg  Sensitive to cipro  Finished cipro 500mg  bid course (ends 9/8)  Fever resolved   Other Stroke Risk Factors   hx of stroke - small remote appearing infarcts in the left frontal lobe by CT and MRI  Other Active Problems  Microcytic anemia - iron 38, Ferritin 115 - put on iron tab bid.  Pt found on floor 04/23/19 by nursing staff. No obvious injury, complaints of pain, or neurologic changes.   Pt had 2-3 times slip off chair while sitting in chair. No fall, no injury. - now move to room 13 closer to nurse station.    Pt had decreased UOP 10/8, put on IVF @ 100 for 24 hours. - labs ok 10/10/20n am  Hospital day # 45  Continue present management. Await rehab bed. Delia HeadyPramod [place], MD  To contact Stroke Continuity provider, please refer to [email or web address]. After hours, contact General Neurology

## 2019-05-29 NOTE — Plan of Care (Signed)
Patient stable, discussed POC with patient and daughter Rodney Collier, agreeable with plan, denies question/concerns at this time.

## 2019-05-30 LAB — GLUCOSE, CAPILLARY
Glucose-Capillary: 132 mg/dL — ABNORMAL HIGH (ref 70–99)
Glucose-Capillary: 139 mg/dL — ABNORMAL HIGH (ref 70–99)
Glucose-Capillary: 97 mg/dL (ref 70–99)

## 2019-05-30 NOTE — Progress Notes (Signed)
Nutrition Follow-up  DOCUMENTATION CODES:   Not applicable  INTERVENTION:  Continue Glucerna Shake po TID, each supplement provides 220 kcal and 10 grams of protein  Encourage adequate PO intake.   NUTRITION DIAGNOSIS:   Inadequate oral intake related to inability to eat as evidenced by NPO status; diet advanced; improved  GOAL:   Patient will meet greater than or equal to 90% of their needs; met  MONITOR:   PO intake, Supplement acceptance, Diet advancement, Skin, Weight trends, Labs, I & O's  REASON FOR ASSESSMENT:   Ventilator    ASSESSMENT:   Rodney Collier is a 53 y.o. male with no significant past medical history presenting with right-sided weakness, decreased sensation on the right, diplopia and headache. Complete revascularization of occluded L PCA w/ mechanical thrombectomy. Self extubated 8/28.TEE9/1which showed no PFO/clot or cardiac source of aneurysm.  Pt is currently on a dysphagia 3 diet with nectar thick liquids. Meal completion has been 75-100%. Intake has improved. Pt currently has Glucerna shake ordered and has been consuming them. RD to continue with current orders to aid in caloric and protein needs. Pt encouraged to eat his food at meals and to drink his supplements. SNF placement pending.   Labs and medications reviewed.   Diet Order:   Diet Order            DIET DYS 3 Room service appropriate? Yes; Fluid consistency: Nectar Thick  Diet effective now              EDUCATION NEEDS:   Not appropriate for education at this time  Skin:  Skin Assessment: Reviewed RN Assessment Skin Integrity Issues:: Other (Comment) Other: N/A  Last BM:  10/11  Height:   Ht Readings from Last 1 Encounters:  04/19/19 6' (1.829 m)    Weight:   Wt Readings from Last 1 Encounters:  04/19/19 77 kg    Ideal Body Weight:  80.9 kg  BMI:  Body mass index is 23.02 kg/m.  Estimated Nutritional Needs:   Kcal:  2000-2200  Protein:  100-115  grams  Fluid:  > 2.0 L    [name], MS, RD, LDN Pager # [phone] After hours/ weekend pager # [phone]

## 2019-05-30 NOTE — Plan of Care (Signed)
Patient stable, NAD noted this shift.  Problem: Nutrition: Goal: Risk of aspiration will decrease Outcome: Progressing, PO diet tol well. Goal: Dietary intake will improve Outcome: Progressing, encouraged to eat 50-75% of meals, tolerates well. Problem: Self-Care: Goal: Ability to communicate needs accurately will improve Outcome: Progressing, pt able verbalize personal care needs.   Problem: Self-Care: Goal: Ability to participate in self-care as condition permits will improve Outcome: Progressing, OOB to [facility] for BM tol well.

## 2019-05-30 NOTE — Progress Notes (Signed)
STROKE TEAM PROGRESS NOTE   INTERVAL HISTORY Patient is lying in bed comfortably.  He is awake alert and follows simple commands.  Continues to have persistent dense right hemiplegia and right visual field defect.  Vitals:   05/29/19 1544 05/29/19 1933 05/29/19 2345 05/30/19 0337  BP: (!) 143/92 123/81 115/85 105/74  Pulse: 98 95 92 78  Resp: 18 18 18    Temp: 99.4 F (37.4 C) 98.4 F (36.9 C) 98.6 F (37 C) 98.1 F (36.7 C)  TempSrc: Oral Oral Oral Oral  SpO2: 100% 99% 100% 99%  Weight:      Height:        CBC:  Recent Labs  Lab 05/25/19 0538 05/28/19 0554  WBC 5.8 5.6  HGB 13.3 11.6*  HCT 42.9 36.0*  MCV 73.1* 72.6*  PLT 220 236    Basic Metabolic Panel:  Recent Labs  Lab 05/25/19 0538 05/28/19 0554  NA 137 136  K 4.4 4.3  CL 102 103  CO2 21* 24  GLUCOSE 91 133*  BUN 12 9  CREATININE 0.82 0.73  CALCIUM 9.7 9.5    IMAGING CT Head WO Contrast 04/16/2019 Areas of acute infarction are showing low-density and swelling but no macroscopic hemorrhage by CT. As shown by previous MRI, there is involvement of the cerebellum, left cerebral peduncle and thalamus and left posteromedial temporal lobe and occipital lobe. Tiny focus of infarction in the right occipital lobe as well.  Rodney Collier Wo Contrast 04/15/2019 1. Acute infarction throughout the left PCA territory (including most of the left thalamus), left more than right cerebellum, and right pons. Petechial hemorrhage is seen at the level of the pons.  2. Prior left frontal infarcts and microvascular ischemic gliosis.   Cerebral angiogram 04/14/2019 4 vessel cerebral arteriogram followed by complete revascularization of occluded Lt PCA  With x 2 passes with 65mm x 58mm embotrap retriever  device achieving a TICI 3 revascularization.  Ct Code Stroke Cta Head W/wo Contrast Ct Code Stroke Cta Neck W/wo Contrast Ct Code Stroke Cta Cerebral Perfusion W/wo Contrast 04/14/2019 1. Left P2 occlusion with downstream  reconstitution. Qualitative assessment of CT perfusion maps shows ischemia without visible core infarct in the left occipital lobe.  2. Mild atherosclerosis in the neck without flow limiting stenosis or embolic source seen.  3. No large vessel occlusion in the anterior circulation.  4. Mild mid basilar narrowing.   Ct Head Code Stroke Wo Contrast 04/14/2019 1. No acute finding.  2. Small remote appearing infarcts in the left frontal lobe.    LE Doppler 04/15/2019 Right: There is no evidence of deep vein thrombosis in the lower extremity. No cystic structure found in the popliteal fossa. Left: There is no evidence of deep vein thrombosis in the lower extremity. No cystic structure found in the popliteal fossa.  2D Echocardiogram 04/15/2019  1. The left ventricle has normal systolic function, with an ejection fraction of 55-60%. The cavity size was normal. Left ventricular diastolic parameters were normal. No evidence of left ventricular regional wall motion abnormalities.  2. The right ventricle has normal systolic function. The cavity was normal. There is no increase in right ventricular wall thickness.  3. The mitral valve is abnormal. Mild thickening of the mitral valve leaflet. The Rodney jet is posteriorly-directed.  4. The tricuspid valve is grossly normal.  5. The aortic valve is tricuspid. No stenosis of the aortic valve.  6. The aorta is normal unless otherwise noted.  7. The inferior vena cava was  normal in size with <50% respiratory variability. SUMMARY LVEF 55-60%, normal wall thickness, normal wall motion, normal diastolic function, mild posteriorly directed Rodney, normal size IVC that does not collapse, no PFO by color doppler  FINDINGS  Left Ventricle: The left ventricle has normal systolic function, with an ejection fraction of 55-60%. The cavity size was normal. There is no increase in left ventricular wall thickness. Left ventricular diastolic parameters were normal. Normal left  ventricular filling pressures No evidence of left ventricular regional wall motion abnormalities..  EEG  04/15/2019 This study is suggestive of cortical dysfunction in the left hemisphere secondary to underlying stroke as well as mild diffuse encephalopathy. No seizures or definite epileptiform discharges were seen throughout the recording.  TCD w/ bubble 04/18/2019  No HTIS heard during rest. No HITS heard during valsalva.  TEE 04/19/2019 LVEF 55-60% No LA/LAA thrombus or mass No ASD or PFO by color flow Doppler or saline microcavitation study. Mild AR, mild Rodney.   PHYSICAL EXAM     Frail middle aged african [place] male not in distress. . Afebrile. Head is nontraumatic. Neck is supple without bruit.    Cardiac exam no murmur or gallop. Lungs are clear to auscultation. Distal pulses are well felt. Neurological Exam: awake, alert, eyes open, following simple commands. nonfluent language but able to have some words articulated, hypophonia much improved.  Slight dysconjugate eyes, left eye mild INO, right eye CN VI incomplete palsy, up and downward gaze palsy. PERRL. Not blinking to visual threat on the right.Rt hemianopia. Right facial droop, tongue midline, LUE 5/5 but significant ataxia. LLE 4/5 at least. On pain stimulation, slight withdraw of RLE but no movement of RUE. Increased muscle tone on the RUE. Positive triple reflex on the right. Sensation and gait not tested.   ASSESSMENT/PLAN Rodney. Rodney Collier is a 53 y.o. male with no significant past medical history presenting with right-sided weakness, decreased sensation on the right, diplopia and headache. Complete revascularization of occluded L PCA w/ mechanical thrombectomy.  Stroke: embolic multifocal posterior circulation infarcts with left P2 occlusion s/p IR with TICI3 reperfusion - likely embolic   Code Stroke CT head No acute abnormality. Old infarct L frontal lobe.   CTA head & neck L P2 occlusion. Mild neck atherosclerosis .  No LVO anterior circulation. Mild mid BA narrowing.  CT perfusion ischemia w/o visible core  CT Head - 04/16/19 - Areas of acute infarction are showing low-density and swelling but no macroscopic hemorrhage by CT.    Cerebral angio TICI3 reperfusion of occluded L PCA  MRI  L PCA (including thalamus), L>R cerebellum, R pontine and left midbrain infarct. Petechial hemorrhage seen in pons. Old L frontal infarcts.   LE Doppler no DVT - no evidence of deep vein thrombosis   2D Echo - EF 55-60%.  No cardiac source of emboli identified.   TCD bubble no HITS  TEE neg  UDS - positive for benzos - UDS done on 9/1  EEG - cortical dysfunction but no seizures.  LDL 88  HgbA1c 12.3  Lovenox 40 mg sq daily  for VTE prophylaxis  No antithrombotic prior to admission, finished DAPT 3-week course, now on aspirin alone   Therapy recommendations:  CIR -> D/t lack of post hospitalization family support - will pursue SNF - will need rapid covid testing prior to D/C  Disposition:  pending (SW trying to help family w/ placement, MA MCD and transfer back to [place])  Medically ready for d/c when bed found  Patient remains stable  Possible seizure  Full body rigid shaking hours post IR, L>R  Loaded with keppra  EEG - cortical dysfunction but no seizures.  Continue keppra 500mg  bid  Hypertension  No hx of HTN, on no home meds  Placed On cleviprex gtt for BP control post IR, now off . Discontinue amlodipine 10 . Now on lotensin to 10mg  bid  . BP stable . BP goal normotensive  Hyperlipidemia  Home meds:  No statin  LDL 88, goal < 70  HDL 39  On lipitor 20  Continue statin at discharge  Diabetes type II, new diagnosis, Uncontrolled Hyperglycemia, improving  HgbA1c 12.3, goal < 7.0  CBGs  continue metformin 1000mg  bid   off glipizde to 5 mg bid d/t hypoglycemia  glipizide 2.5mg  QA C -> 2.5mg  bid -> hypoglycemia 10/6 -> back to 2.5 mg qac  DM coordinator following -  should glucoses remain elevated. May benefit from SGLT-2inhibitior if he gets insurance   SSI 0-15 scale, 0-5 hs  Likely hypercoagulable state associated with uncontrolled DM  Dysphagia Malnutrition  . Secondary to stroke . Speech on board . D3 nectar thick liquids . Encourage po intake . Off IVF, lab stable . On glucerna shakes tid  UTI with fever, resolved  UA showed WBC > 50  Urine culture enterobactor and staph coag neg  Sensitive to cipro  Finished cipro 500mg  bid course (ends 9/8)  Fever resolved   Other Stroke Risk Factors   hx of stroke - small remote appearing infarcts in the left frontal lobe by CT and MRI  Other Active Problems  Microcytic anemia - iron 38, Ferritin 115 - put on iron tab bid.  Pt found on floor 04/23/19 by nursing staff. No obvious injury, complaints of pain, or neurologic changes.   Pt had 2-3 times slip off chair while sitting in chair. No fall, no injury. - now move to room 13 closer to nurse station.    Pt had decreased UOP 10/8, put on IVF @ 100 for 24 hours. - labs ok 10/10/20n am  Hospital day # 5046   Patient continues to be difficult to place in the nursing home.  Discussed with case manager still no image of solution to problem to find him and nursing home bed.  Continue present management  Delia HeadyPramod Petr [place], MD  To contact Stroke Continuity provider, please refer to [email or web address]. After hours, contact General Neurology

## 2019-05-30 NOTE — Progress Notes (Signed)
PT Cancellation Note  Patient Details Name: Rodney Collier MRN: [id number] DOB: 1966/02/03   Cancelled Treatment:    Reason Eval/Treat Not Completed: Patient declined, no reason specified. PT attempted to see patient for treatment however pt declining therapy at this time, reporting it has not been a good day and he doesn't feel like it. PT attempts to encourage pt but pt continues to decline. PT will attempt to follow up as time allows.   Zenaida Niece 05/30/2019, 3:36 PM

## 2019-05-30 NOTE — TOC Progression Note (Signed)
Transition of Care [facility]) - Progression Note    Patient Details  Name: Rodney Collier MRN: [id number] Date of Birth: 02-Mar-1966  [facility] of [facility] CM/SW [place], [place] Phone Number: 05/30/2019, 2:27 PM  Clinical Narrative:  CSW reached out to patient's daughter, [name], as she never got back to CSW with other choices for SNF vs CIR placement.  Samalie apologized that she has been very busy with work and has not had time, but Samalie asked for CSW to try the places again and also just try to find somewhere for him to go, and update Samalie when someone offers patient a place.   Samalie also asked about healthcare proxy, and CSW again explained that he doesn't have capacity to indicate a healthcare proxy. Samalie still doesn't understand, as she says that he answers all questions appropriately when she talks to him, but CSW indicated that the doctors do not feel that he is fully capable of making his own decisions at this point. CSW hopeful that when a facility offers a bed for patient, the facility can walk the daughter through obtaining guardianship, if that's needed at that point.  CSW to make phone calls to try to locate a bed for the patient. No bed offers received at this time.    Expected Discharge Plan: [place] Barriers to Discharge: SNF Pending bed offer, Homeless with medical needs, Other (comment)(needs placement in [place])  Expected Discharge Plan and Services Expected Discharge Plan: [place] Choice: [place]                                         Social Determinants of Health (SDOH) Interventions    Readmission Risk Interventions No flowsheet data found.

## 2019-05-31 LAB — GLUCOSE, CAPILLARY
Glucose-Capillary: 123 mg/dL — ABNORMAL HIGH (ref 70–99)
Glucose-Capillary: 206 mg/dL — ABNORMAL HIGH (ref 70–99)
Glucose-Capillary: 50 mg/dL — ABNORMAL LOW (ref 70–99)
Glucose-Capillary: 84 mg/dL (ref 70–99)
Glucose-Capillary: 116 mg/dL — ABNORMAL HIGH (ref 70–99)

## 2019-05-31 MED ORDER — BUPROPION HCL 75 MG PO TABS
75.0000 mg | ORAL_TABLET | Freq: Two times a day (BID) | ORAL | Status: DC
  Administered 2019-05-31 – 2019-06-11 (×20): 75 mg via ORAL
  Filled 2019-05-31 (×37): qty 1

## 2019-05-31 NOTE — Progress Notes (Signed)
Physical Therapy Treatment Patient Details Name: Rodney Collier MRN: [id number] DOB: 19-Jul-1966 Today's Date: 05/31/2019    History of Present Illness Pt is a 53 y.o. M with no known PMH who presents with right sided weakness, decreased sensation on right, diplopia and headache. CT showing remote L frontal infarcts, CTA head/neck with L P2 occlusion, MRI with acute L PCA infarct including much of L thalamus, L > R cerebellum, R pons. Now s/p complete revascularization of occluded L PCA with mechanical thrombectomy. ETT 8/27-8/28; self extubated 8/28.    PT Comments    Pt tolerance for activity limited by reports of dizziness and feeling bad. Pt performs bed mobility with limited assistance, and demonstrates improved technique in sit to stands. PT attempted to initiate gait training however pt with buckling and onset of dizziness. Pt will continue to benefit from PT POC to improve ambulation and reduce falls risk.    Follow Up Recommendations  SNF     Equipment Recommendations  Wheelchair (measurements PT);Wheelchair cushion (measurements PT);3in1 (PT);Other (comment)    Recommendations for Other Services       Precautions / Restrictions Precautions Precautions: Fall Precaution Comments: R hemiparesis and inattention. Required Braces or Orthoses: Knee Immobilizer - Right Knee Immobilizer - Right: Other (comment)(not required but utilized for gait training) Restrictions Weight Bearing Restrictions: No    Mobility  Bed Mobility Overal bed mobility: Needs Assistance Bed Mobility: Supine to Sit     Supine to sit: Min assist(RLE management)        Transfers Overall transfer level: Needs assistance Equipment used: None Transfers: Stand Pivot Transfers;Sit to/from Stand Sit to Stand: Min assist Stand pivot transfers: Mod assist       General transfer comment: minA sit to stand with R knee block and R knee immobilizer  Ambulation/Gait Ambulation/Gait assistance: Max  assist Gait Distance (Feet): 1 Feet Assistive device: (L railing) Gait Pattern/deviations: Step-to pattern Gait velocity: Decreased Gait velocity interpretation: <1.31 ft/sec, indicative of household ambulator General Gait Details: pt taking one step with R foot with PT maxA, pt attempts to take step with L foot however buckles requiring maxA to correct. Pt then becomes dizzy and sits.   Stairs             Wheelchair Mobility    Modified Rankin (Stroke Patients Only) Modified Rankin (Stroke Patients Only) Pre-Morbid Rankin Score: No symptoms Modified Rankin: Moderately severe disability     Balance Overall balance assessment: Needs assistance Sitting-balance support: Feet supported;Single extremity supported Sitting balance-Leahy Scale: Fair Sitting balance - Comments: close supervision with support of LUE on rail and feet supported   Standing balance support: Single extremity supported(LUE support of rail and R knee block) Standing balance-Leahy Scale: Poor Standing balance comment: modA with LUE rail support and R knee block                            Cognition Arousal/Alertness: Awake/alert Behavior During Therapy: Impulsive Overall Cognitive Status: Impaired/Different from baseline Area of Impairment: Safety/judgement;Awareness;Problem solving;Following commands                       Following Commands: Follows one step commands consistently Safety/Judgement: Decreased awareness of safety;Decreased awareness of deficits   Problem Solving: Slow processing        Exercises      General Comments        Pertinent Vitals/Pain Pain Assessment: No/denies pain    Home Living  Prior Function            PT Goals (current goals can now be found in the care plan section) Acute Rehab PT Goals Patient Stated Goal: to walk Progress towards PT goals: Progressing toward goals    Frequency    Min  3X/week      PT Plan Current plan remains appropriate    Co-evaluation              AM-PAC PT "6 Clicks" Mobility   Outcome Measure  Help needed turning from your back to your side while in a flat bed without using bedrails?: A Little Help needed moving from lying on your back to sitting on the side of a flat bed without using bedrails?: A Little Help needed moving to and from a bed to a chair (including a wheelchair)?: A Lot Help needed standing up from a chair using your arms (e.g., wheelchair or bedside chair)?: A Lot Help needed to walk in hospital room?: Total Help needed climbing 3-5 steps with a railing? : Total 6 Click Score: 12    End of Session Equipment Utilized During Treatment: Gait belt;Right knee immobilizer;Other (comment)(Ace wrap DF assist) Activity Tolerance: Other (comment)(limited 2/2 dizziness and feeling unwell) Patient left: in chair;with call bell/phone within reach;with chair alarm set Nurse Communication: Mobility status PT Visit Diagnosis: Unsteadiness on feet (R26.81);Muscle weakness (generalized) (M62.81);Difficulty in walking, not elsewhere classified (R26.2);Hemiplegia and hemiparesis Hemiplegia - Right/Left: Right Hemiplegia - dominant/non-dominant: Non-dominant Hemiplegia - caused by: Cerebral infarction     Time: 1151-1220 PT Time Calculation (min) (ACUTE ONLY): 29 min  Charges:  $Therapeutic Activity: 23-37 mins                     Arlyss Gandy, PT, DPT Acute Rehabilitation Pager: [phone]    Arlyss Gandy 05/31/2019, 3:14 PM

## 2019-05-31 NOTE — Progress Notes (Signed)
STROKE TEAM PROGRESS NOTE   INTERVAL HISTORY Patient is lying in bed comfortably.  He is awake alert and follows simple commands.  Continues to have persistent dense right hemiplegia and right visual field defect.No changes  Vitals:   05/30/19 2352 05/31/19 0403 05/31/19 0846 05/31/19 1155  BP: 117/76 115/74 110/72 127/83  Pulse: 89 86 75 89  Resp: 17 17 16 16   Temp: 98.6 F (37 C) 98 F (36.7 C) 98.2 F (36.8 C) 98.4 F (36.9 C)  TempSrc: Oral Oral Oral Oral  SpO2: 98% 100% 99% 100%  Weight:      Height:        CBC:  Recent Labs  Lab 05/25/19 0538 05/28/19 0554  WBC 5.8 5.6  HGB 13.3 11.6*  HCT 42.9 36.0*  MCV 73.1* 72.6*  PLT 220 236    Basic Metabolic Panel:  Recent Labs  Lab 05/25/19 0538 05/28/19 0554  NA 137 136  K 4.4 4.3  CL 102 103  CO2 21* 24  GLUCOSE 91 133*  BUN 12 9  CREATININE 0.82 0.73  CALCIUM 9.7 9.5    IMAGING CT Head WO Contrast 04/16/2019 Areas of acute infarction are showing low-density and swelling but no macroscopic hemorrhage by CT. As shown by previous MRI, there is involvement of the cerebellum, left cerebral peduncle and thalamus and left posteromedial temporal lobe and occipital lobe. Tiny focus of infarction in the right occipital lobe as well.  [name] 04/15/2019 1. Acute infarction throughout the left PCA territory (including most of the left thalamus), left more than right cerebellum, and right pons. Petechial hemorrhage is seen at the level of the pons.  2. Prior left frontal infarcts and microvascular ischemic gliosis.   Cerebral angiogram 04/14/2019 4 vessel cerebral arteriogram followed by complete revascularization of occluded Lt PCA  With x 2 passes with 80mm x 16mm embotrap retriever  device achieving a TICI 3 revascularization.  Ct Code Stroke Cta Head W/wo Contrast Ct Code Stroke Cta Neck W/wo Contrast Ct Code Stroke Cta Cerebral Perfusion W/wo Contrast 04/14/2019 1. Left P2 occlusion with downstream  reconstitution. Qualitative assessment of CT perfusion maps shows ischemia without visible core infarct in the left occipital lobe.  2. Mild atherosclerosis in the neck without flow limiting stenosis or embolic source seen.  3. No large vessel occlusion in the anterior circulation.  4. Mild mid basilar narrowing.   Ct Head Code Stroke Wo Contrast 04/14/2019 1. No acute finding.  2. Small remote appearing infarcts in the left frontal lobe.    LE Doppler 04/15/2019 Right: There is no evidence of deep vein thrombosis in the lower extremity. No cystic structure found in the popliteal fossa. Left: There is no evidence of deep vein thrombosis in the lower extremity. No cystic structure found in the popliteal fossa.  2D Echocardiogram 04/15/2019  1. The left ventricle has normal systolic function, with an ejection fraction of 55-60%. The cavity size was normal. Left ventricular diastolic parameters were normal. No evidence of left ventricular regional wall motion abnormalities.  2. The right ventricle has normal systolic function. The cavity was normal. There is no increase in right ventricular wall thickness.  3. The mitral valve is abnormal. Mild thickening of the mitral valve leaflet. The MR jet is posteriorly-directed.  4. The tricuspid valve is grossly normal.  5. The aortic valve is tricuspid. No stenosis of the aortic valve.  6. The aorta is normal unless otherwise noted.  7. The inferior vena cava was  normal in size with <50% respiratory variability. SUMMARY LVEF 55-60%, normal wall thickness, normal wall motion, normal diastolic function, mild posteriorly directed MR, normal size IVC that does not collapse, no PFO by color doppler  FINDINGS  Left Ventricle: The left ventricle has normal systolic function, with an ejection fraction of 55-60%. The cavity size was normal. There is no increase in left ventricular wall thickness. Left ventricular diastolic parameters were normal. Normal left  ventricular filling pressures No evidence of left ventricular regional wall motion abnormalities..  EEG  04/15/2019 This study is suggestive of cortical dysfunction in the left hemisphere secondary to underlying stroke as well as mild diffuse encephalopathy. No seizures or definite epileptiform discharges were seen throughout the recording.  TCD w/ bubble 04/18/2019  No HTIS heard during rest. No HITS heard during valsalva.  TEE 04/19/2019 LVEF 55-60% No LA/LAA thrombus or mass No ASD or PFO by color flow Doppler or saline microcavitation study. Mild AR, mild MR.   PHYSICAL EXAM     Frail middle aged african [place] male not in distress. . Afebrile. Head is nontraumatic. Neck is supple without bruit.    Cardiac exam no murmur or gallop. Lungs are clear to auscultation. Distal pulses are well felt. Neurological Exam: awake, alert, eyes open, following simple commands. nonfluent language but able to have some words articulated, hypophonia much improved.  Slight dysconjugate eyes, left eye mild INO, right eye CN VI incomplete palsy, up and downward gaze palsy. PERRL. Not blinking to visual threat on the right.Rt hemianopia. Right facial droop, tongue midline, LUE 5/5 but significant ataxia. LLE 4/5 at least. On pain stimulation, slight withdraw of RLE but no movement of RUE. Increased muscle tone on the RUE. Positive triple reflex on the right. Sensation and gait not tested.   ASSESSMENT/PLAN [name] with no significant past medical history presenting with right-sided weakness, decreased sensation on the right, diplopia and headache. Complete revascularization of occluded L PCA w/ mechanical thrombectomy.  Stroke: embolic multifocal posterior circulation infarcts with left P2 occlusion s/p IR with TICI3 reperfusion - likely embolic   Code Stroke CT head No acute abnormality. Old infarct L frontal lobe.   CTA head & neck L P2 occlusion. Mild neck atherosclerosis .  No LVO anterior circulation. Mild mid BA narrowing.  CT perfusion ischemia w/o visible core  CT Head - 04/16/19 - Areas of acute infarction are showing low-density and swelling but no macroscopic hemorrhage by CT.    Cerebral angio TICI3 reperfusion of occluded L PCA  MRI  L PCA (including thalamus), L>R cerebellum, R pontine and left midbrain infarct. Petechial hemorrhage seen in pons. Old L frontal infarcts.   LE Doppler no DVT - no evidence of deep vein thrombosis   2D Echo - EF 55-60%.  No cardiac source of emboli identified.   TCD bubble no HITS  TEE neg  UDS - positive for benzos - UDS done on 9/1  EEG - cortical dysfunction but no seizures.  LDL 88  HgbA1c 12.3  Lovenox 40 mg sq daily  for VTE prophylaxis  No antithrombotic prior to admission, finished DAPT 3-week course, now on aspirin alone   Therapy recommendations:  CIR -> D/t lack of post hospitalization family support - will pursue SNF - will need rapid covid testing prior to D/C  Disposition:  pending (SW trying to help family w/ placement, MA MCD and transfer back to [place])  Medically ready for d/c when bed found  Patient remains stable  Possible seizure  Full body rigid shaking hours post IR, L>R  Loaded with keppra  EEG - cortical dysfunction but no seizures.  Continue keppra 500mg  bid  Hypertension  No hx of HTN, on no home meds  Placed On cleviprex gtt for BP control post IR, now off . Discontinue amlodipine 10 . Now on lotensin to 10mg  bid  . BP stable . BP goal normotensive  Hyperlipidemia  Home meds:  No statin  LDL 88, goal < 70  HDL 39  On lipitor 20  Continue statin at discharge  Diabetes type II, new diagnosis, Uncontrolled Hyperglycemia, improving  HgbA1c 12.3, goal < 7.0  CBGs  continue metformin 1000mg  bid   off glipizde to 5 mg bid d/t hypoglycemia  glipizide 2.5mg  QA C -> 2.5mg  bid -> hypoglycemia 10/6 -> back to 2.5 mg qac  DM coordinator following -  should glucoses remain elevated. May benefit from SGLT-2inhibitior if he gets insurance   SSI 0-15 scale, 0-5 hs  Likely hypercoagulable state associated with uncontrolled DM  Dysphagia Malnutrition  . Secondary to stroke . Speech on board . D3 nectar thick liquids . Encourage po intake . Off IVF, lab stable . On glucerna shakes tid  UTI with fever, resolved  UA showed WBC > 50  Urine culture enterobactor and staph coag neg  Sensitive to cipro  Finished cipro 500mg  bid course (ends 9/8)  Fever resolved   Other Stroke Risk Factors   hx of stroke - small remote appearing infarcts in the left frontal lobe by CT and MRI  Other Active Problems  Microcytic anemia - iron 38, Ferritin 115 - put on iron tab bid.  Pt found on floor 04/23/19 by nursing staff. No obvious injury, complaints of pain, or neurologic changes.   Pt had 2-3 times slip off chair while sitting in chair. No fall, no injury. - now move to room 13 closer to nurse station.    Pt had decreased UOP 10/8, put on IVF @ 100 for 24 hours. - labs ok 10/10/20n am  Hospital day # [id number]   Patient continues to be difficult to place in the nursing home.  Discussed with case Production designer, theatre/television/filmmanager  .  Continue present management  Delia HeadyPramod Garrie Elenes, MD  To contact Stroke Continuity provider, please refer to [email or web address]. After hours, contact General Neurology

## 2019-05-31 NOTE — Progress Notes (Signed)
  Speech Language Pathology Treatment: Cognitive-Linquistic;Dysphagia  Patient Details Name: General Wearing MRN: [id number] DOB: 1966/06/09 Today's Date: 05/31/2019 Time: 1500-1540 SLP Time Calculation (min) (ACUTE ONLY): 40 min  Assessment / Plan / Recommendation Clinical Impression   Pt seen at bedside for skilled ST intervention targeting goals for improved communication effectiveness and swallow safety. Pt was observed with nectar thick liquids and thin liquid trials. Pt is impulsive with drinking, but did not exhibit overt s/s aspiration with either nectar thick or thin liquids. Will consider repeating MBS in the near future to assess readiness for advanced textures.  Pt was noted to be tearful during this session, communicating verbally more than I have witnessed. His voice quality is stronger - no cues required to use voicing when speaking. Pt's intelligibility is significantly reduced, due to dysarthria, low volume, [place] accent, and use of [place] AND [place] words. Receptively, Pt continues to have difficulty following one-step verbal directions consistently. Mod cues are repetition are needed to improve accuracy. Pt is quite perseverative as well.  Responses to yes/no questions are 80% accurate. Expressively, Pt requires mod+ cues for success with automatic sequences and naming tasks. This could be exacerbated by difficulty switching between [place] and [place].   Pt became tearful at the end of the session, saying "everything has changed - why?", "I don't know", and several other unintelligible sentences that were made more unclear due to crying. MD and RN were sent a secure chat to recommend consideration of something for depression.  SLP will continue to follow acutely. Continued ST intervention is recommended at next venue.    HPI HPI: Pt is a 53 y.o. M with no known PMH who presents with right sided weakness, decreased sensation on right, diplopia and headache. CT showing remote L  frontal infarcts, CTA head/neck with L P2 occlusion, MRI with acute L PCA infarct including much of L thalamus, L > R cerebellum, R pons. Now s/p complete revascularization of occluded L PCA with mechanical thrombectomy. ETT 8/27-8/28; self extubated 8/28.      SLP Plan  Goals updated;Continue with current plan of care       Recommendations  Diet recommendations: Dysphagia 3 (mechanical soft);Nectar-thick liquid Liquids provided via: Cup;Straw Medication Administration: Whole meds with puree Supervision: Patient able to self feed;Full supervision/cueing for compensatory strategies Compensations: Minimize environmental distractions;Small sips/bites;Slow rate;Lingual sweep for clearance of pocketing Postural Changes and/or Swallow Maneuvers: Seated upright 90 degrees;Upright 30-60 min after meal                Oral Care Recommendations: Oral care QID Follow up Recommendations: Skilled Nursing facility;24 hour supervision/assistance SLP Visit Diagnosis: Aphasia (R47.01);Apraxia (R48.2);Dysphagia, oropharyngeal phase (R13.12) Plan: Goals updated;Continue with current plan of care       [place] B. Quentin Ore, [facility], [place] Speech Language Pathologist Office: [phone] Pager: [phone]  [name] 05/31/2019, 3:54 PM

## 2019-06-01 LAB — COMPREHENSIVE METABOLIC PANEL
ALT: 29 U/L (ref 0–44)
AST: 18 U/L (ref 15–41)
Albumin: 3 g/dL — ABNORMAL LOW (ref 3.5–5.0)
Alkaline Phosphatase: 64 U/L (ref 38–126)
Anion gap: 10 (ref 5–15)
BUN: 8 mg/dL (ref 6–20)
CO2: 24 mmol/L (ref 22–32)
Calcium: 9.6 mg/dL (ref 8.9–10.3)
Chloride: 103 mmol/L (ref 98–111)
Creatinine, Ser: 0.74 mg/dL (ref 0.61–1.24)
GFR calc Af Amer: >60 mL/min (ref >60)
GFR calc non Af Amer: >60 mL/min (ref >60)
Glucose, Bld: 123 mg/dL — ABNORMAL HIGH (ref 70–99)
Potassium: 4.1 mmol/L (ref 3.5–5.1)
Sodium: 137 mmol/L (ref 135–145)
Total Bilirubin: 0.5 mg/dL (ref 0.3–1.2)
Total Protein: 6.4 g/dL — ABNORMAL LOW (ref 6.5–8.1)

## 2019-06-01 LAB — GLUCOSE, CAPILLARY
Glucose-Capillary: 79 mg/dL (ref 70–99)
Glucose-Capillary: 121 mg/dL — ABNORMAL HIGH (ref 70–99)
Glucose-Capillary: 109 mg/dL — ABNORMAL HIGH (ref 70–99)
Glucose-Capillary: 157 mg/dL — ABNORMAL HIGH (ref 70–99)
Glucose-Capillary: 139 mg/dL — ABNORMAL HIGH (ref 70–99)

## 2019-06-01 LAB — CBC
HCT: 36.6 % — ABNORMAL LOW (ref 39.0–52.0)
Hemoglobin: 11.8 g/dL — ABNORMAL LOW (ref 13.0–17.0)
MCH: 23.1 pg — ABNORMAL LOW (ref 26.0–34.0)
MCHC: 32.2 g/dL (ref 30.0–36.0)
MCV: 71.8 fL — ABNORMAL LOW (ref 80.0–100.0)
Platelets: 283 10*3/uL (ref 150–400)
RBC: 5.1 MIL/uL (ref 4.22–5.81)
RDW: 14.1 % (ref 11.5–15.5)
WBC: 6.2 10*3/uL (ref 4.0–10.5)
nRBC: 0 % (ref 0.0–0.2)

## 2019-06-01 NOTE — Progress Notes (Addendum)
Inpatient Diabetes Program Recommendations  AACE/ADA: New Consensus Statement on Inpatient Glycemic Control (2015)  Target Ranges:  Prepandial:   less than 140 mg/dL      Peak postprandial:   less than 180 mg/dL (1-2 hours)      Critically ill patients:  140 - 180 mg/dL   Lab Results  Component Value Date   GLUCAP 121 (H) 06/01/2019   HGBA1C 12.3 (H) 04/15/2019    Review of Glycemic Control Results for BECKEM, TOMBERLIN (MRN [id number]) as of 06/01/2019 11:13  Ref. Range 05/31/2019 11:53 05/31/2019 15:48 05/31/2019 16:29 05/31/2019 21:09 06/01/2019 06:06  Glucose-Capillary Latest Ref Range: 70 - 99 mg/dL 206 (H) 50 (L) 84 116 (H) 121 (H)   Diabetes history: DM 2 Current orders for Inpatient glycemic control:  Glipizide 2.5 mg QD Novolog 0-15 units + hs Metformin 1000 mg bid  Inpatient Diabetes Program Recommendations:    Patient experience hypoglycemia of 50 mg/dL yesterday following Novolog 5 units for correction. Appears 206 mg/dL was during meal time. If to remain inpatient, consider reducing correction to Novolog 0-9 units TID.   Thanks, Bronson Curb, MSN, RNC-OB Diabetes Coordinator [phone] (8a-5p)

## 2019-06-01 NOTE — Progress Notes (Signed)
Occupational Therapy Treatment Patient Details Name: Rodney Collier MRN: [id number] DOB: 03-17-1966 Today's Date: 06/01/2019    History of present illness Pt is a 53 y.o. M with no known PMH who presents with right sided weakness, decreased sensation on right, diplopia and headache. CT showing remote L frontal infarcts, CTA head/neck with L P2 occlusion, MRI with acute L PCA infarct including much of L thalamus, L > R cerebellum, R pons. Now s/p complete revascularization of occluded L PCA with mechanical thrombectomy. ETT 8/27-8/28; self extubated 8/28.   OT comments  Pt progressing this session. Pt minA for bed mobility mostly using L side only requiring assist for R side to continue. Pt minA sit to stand with R knee block. Pt would not wear glasses continuously. OT focused session on attending to the right side, required max vc for scanning strategies. RUE PROM to [facility] performed. Pt continues to use LUE for all functional tasks. Pt performing minA to modA for ADL tasks sitting EOB. Pt would benefit from continued OT skilled services for ADL, mobility and safety. OT following acutely.    Follow Up Recommendations  SNF    Equipment Recommendations  None recommended by OT    Recommendations for Other Services      Precautions / Restrictions Precautions Precautions: Fall Precaution Comments: R hemiparesis and inattention. Knee Immobilizer - Right: Other (comment)(no longer has) Restrictions Weight Bearing Restrictions: No       Mobility Bed Mobility Overal bed mobility: Needs Assistance Bed Mobility: Supine to Sit     Supine to sit: Min assist     General bed mobility comments: using bed rail and LUE/LLE mostly. Pt requiring assist for RLE.  Transfers Overall transfer level: Needs assistance Equipment used: None Transfers: Sit to/from [facility] Sit to Stand: Min assist Stand pivot transfers: Mod assist       General transfer comment: minA sit to  stand with R knee block    Balance Overall balance assessment: Needs assistance Sitting-balance support: Feet supported;Single extremity supported Sitting balance-Leahy Scale: Fair Sitting balance - Comments: close supervision with support of LUE on rail and feet supported   Standing balance support: Single extremity supported Standing balance-Leahy Scale: Poor Standing balance comment: modA with LUE rail support and R knee block                           ADL either performed or assessed with clinical judgement   ADL Overall ADL's : Needs assistance/impaired     Grooming: Wash/dry face;Sitting;Minimal assistance                               Functional mobility during ADLs: Moderate assistance;Cueing for safety General ADL Comments: focused session on attending to the right side, required max vc for scanning strategies     Vision   Vision Assessment?: Vision impaired- to be further tested in functional context Ocular Range of Motion: Restricted on the right Additional Comments: Pt would not wear glasses continuously.   Perception     Praxis      Cognition Arousal/Alertness: Awake/alert Behavior During Therapy: Impulsive Overall Cognitive Status: Impaired/Different from baseline Area of Impairment: Safety/judgement;Awareness;Problem solving;Following commands                       Following Commands: Follows one step commands consistently Safety/Judgement: Decreased awareness of safety;Decreased awareness of deficits  Problem Solving: Slow processing General Comments: Possibly speaking [place] throughout session, continues to be slurred.        Exercises General Exercises - Upper Extremity Shoulder Flexion: PROM;5 reps;Supine;Right Shoulder ABduction: PROM;5 reps;Right;Seated Shoulder Horizontal ABduction: PROM;5 reps;Right;Supine Elbow Flexion: AAROM;Right;5 reps;Supine Elbow Extension: AAROM;Right;5 reps;Supine Wrist Flexion:  AAROM;Right;Supine Wrist Extension: Right;5 reps;AAROM;Supine Digit Composite Flexion: PROM;5 reps;Right;Seated Composite Extension: PROM;5 reps;Right;Seated   Shoulder Instructions       General Comments      Pertinent Vitals/ Pain       Pain Assessment: No/denies pain  Home Living                                          Prior Functioning/Environment              Frequency  Min 2X/week        Progress Toward Goals  OT Goals(current goals can now be found in the care plan section)  Progress towards OT goals: Progressing toward goals  Acute Rehab OT Goals Patient Stated Goal: to walk OT Goal Formulation: Patient unable to participate in goal setting Time For Goal Achievement: 06/15/19 Potential to Achieve Goals: Good ADL Goals Pt Will Perform Grooming: with min guard assist;sitting Pt Will Transfer to Toilet: with min assist;stand pivot transfer;bedside commode Additional ADL Goal #1: Pt will complete bed mobility with supervision as precursor to ADL Additional ADL Goal #2: Pt will consistently follow 2 step command  Plan Discharge plan remains appropriate    Co-evaluation                 AM-PAC OT "6 Clicks" Daily Activity     Outcome Measure   Help from another person eating meals?: A Little Help from another person taking care of personal grooming?: A Lot Help from another person toileting, which includes using toliet, bedpan, or urinal?: A Lot Help from another person bathing (including washing, rinsing, drying)?: A Lot Help from another person to put on and taking off regular upper body clothing?: A Lot Help from another person to put on and taking off regular lower body clothing?: A Lot 6 Click Score: 13    End of Session Equipment Utilized During Treatment: Gait belt  OT Visit Diagnosis: Unsteadiness on feet (R26.81);Hemiplegia and hemiparesis Hemiplegia - Right/Left: Right Hemiplegia - dominant/non-dominant:  Dominant Hemiplegia - caused by: Cerebral infarction   Activity Tolerance Patient tolerated treatment well   Patient Left in chair;with call bell/phone within reach;with chair alarm set   Nurse Communication Mobility status        Time: 1478-2956 OT Time Calculation (min): 31 min  Charges: OT General Charges $OT Visit: 1 Visit OT Treatments $Self Care/Home Management : 23-37 mins  Revonda Standard Cecil Cranker) Glendell Docker OTR/L Acute Rehabilitation Services Pager: [phone] Office: [phone]    Lonzo Cloud 06/01/2019, 5:41 PM

## 2019-06-01 NOTE — Plan of Care (Signed)
Progressing towards goals

## 2019-06-01 NOTE — Progress Notes (Signed)
SLP Cancellation Note  Patient Details Name: Rodney Collier MRN: [id number] DOB: 02-Oct-1965   Cancelled treatment:       Reason Eval/Treat Not Completed: Other (comment)(will plan repeat MBS tomorrow am, messaged Md and informed RN and pt of plan)  Although pt is impulsive with po intake, he may be able to tolerate thin liquids independently between meals.     Macario Golds 06/01/2019, 9:06 AM  Luanna Salk, MS [facility] SLP Acute Rehab Services Pager [phone] Office [phone]

## 2019-06-01 NOTE — Progress Notes (Signed)
STROKE TEAM PROGRESS NOTE   INTERVAL HISTORY He is lying comfortably in bed.  Speech therapy want to do modified barium to see if they can upgrade his diet.  He has no complaints.  Vitals:   05/31/19 2319 06/01/19 0339 06/01/19 0929 06/01/19 1143  BP: 137/88 123/79 113/72 113/73  Pulse: 91 89 84 89  Resp: 16 16 16 16   Temp: 98 F (36.7 C) 98 F (36.7 C) 98.8 F (37.1 C) 98.8 F (37.1 C)  TempSrc:  Oral Oral Oral  SpO2: 100% 99% 100% 99%  Weight:      Height:        CBC:  Recent Labs  Lab 05/28/19 0554 06/01/19 0416  WBC 5.6 6.2  HGB 11.6* 11.8*  HCT 36.0* 36.6*  MCV 72.6* 71.8*  PLT 236 283    Basic Metabolic Panel:  Recent Labs  Lab 05/28/19 0554 06/01/19 0416  NA 136 137  K 4.3 4.1  CL 103 103  CO2 24 24  GLUCOSE 133* 123*  BUN 9 8  CREATININE 0.73 0.74  CALCIUM 9.5 9.6    IMAGING CT Head WO Contrast 04/16/2019 Areas of acute infarction are showing low-density and swelling but no macroscopic hemorrhage by CT. As shown by previous MRI, there is involvement of the cerebellum, left cerebral peduncle and thalamus and left posteromedial temporal lobe and occipital lobe. Tiny focus of infarction in the right occipital lobe as well.  [name] 04/15/2019 1. Acute infarction throughout the left PCA territory (including most of the left thalamus), left more than right cerebellum, and right pons. Petechial hemorrhage is seen at the level of the pons.  2. Prior left frontal infarcts and microvascular ischemic gliosis.   Cerebral angiogram 04/14/2019 4 vessel cerebral arteriogram followed by complete revascularization of occluded Lt PCA  With x 2 passes with 73mm x 43mm embotrap retriever  device achieving a TICI 3 revascularization.  Ct Code Stroke Cta Head W/wo Contrast Ct Code Stroke Cta Neck W/wo Contrast Ct Code Stroke Cta Cerebral Perfusion W/wo Contrast 04/14/2019 1. Left P2 occlusion with downstream reconstitution. Qualitative assessment of CT  perfusion maps shows ischemia without visible core infarct in the left occipital lobe.  2. Mild atherosclerosis in the neck without flow limiting stenosis or embolic source seen.  3. No large vessel occlusion in the anterior circulation.  4. Mild mid basilar narrowing.   Ct Head Code Stroke Wo Contrast 04/14/2019 1. No acute finding.  2. Small remote appearing infarcts in the left frontal lobe.    LE Doppler 04/15/2019 Right: There is no evidence of deep vein thrombosis in the lower extremity. No cystic structure found in the popliteal fossa. Left: There is no evidence of deep vein thrombosis in the lower extremity. No cystic structure found in the popliteal fossa.  2D Echocardiogram 04/15/2019  1. The left ventricle has normal systolic function, with an ejection fraction of 55-60%. The cavity size was normal. Left ventricular diastolic parameters were normal. No evidence of left ventricular regional wall motion abnormalities.  2. The right ventricle has normal systolic function. The cavity was normal. There is no increase in right ventricular wall thickness.  3. The mitral valve is abnormal. Mild thickening of the mitral valve leaflet. The MR jet is posteriorly-directed.  4. The tricuspid valve is grossly normal.  5. The aortic valve is tricuspid. No stenosis of the aortic valve.  6. The aorta is normal unless otherwise noted.  7. The inferior vena cava was normal in  size with <50% respiratory variability. SUMMARY LVEF 55-60%, normal wall thickness, normal wall motion, normal diastolic function, mild posteriorly directed MR, normal size IVC that does not collapse, no PFO by color doppler  FINDINGS  Left Ventricle: The left ventricle has normal systolic function, with an ejection fraction of 55-60%. The cavity size was normal. There is no increase in left ventricular wall thickness. Left ventricular diastolic parameters were normal. Normal left ventricular filling pressures No evidence of  left ventricular regional wall motion abnormalities..  EEG  04/15/2019 This study is suggestive of cortical dysfunction in the left hemisphere secondary to underlying stroke as well as mild diffuse encephalopathy. No seizures or definite epileptiform discharges were seen throughout the recording.  TCD w/ bubble 04/18/2019  No HTIS heard during rest. No HITS heard during valsalva.  TEE 04/19/2019 LVEF 55-60% No LA/LAA thrombus or mass No ASD or PFO by color flow Doppler or saline microcavitation study. Mild AR, mild MR.   PHYSICAL EXAM       Frail middle aged african [place] male not in distress. . Afebrile. Head is nontraumatic. Neck is supple without bruit.    Cardiac exam no murmur or gallop. Lungs are clear to auscultation. Distal pulses are well felt. Neurological Exam: awake, alert, eyes open, following simple commands. nonfluent language but able to have some words articulated, hypophonia much improved.  Slight dysconjugate eyes, left eye mild INO, right eye CN VI incomplete palsy, up and downward gaze palsy. PERRL. Not blinking to visual threat on the right.Rt hemianopia. Right facial droop, tongue midline, LUE 5/5 but significant ataxia. LLE 4/5 at least. On pain stimulation, slight withdraw of RLE but no movement of RUE. Increased muscle tone on the RUE. Positive triple reflex on the right. Sensation and gait not tested.   ASSESSMENT/PLAN Rodney Collier is a 53 y.o. male with no significant past medical history presenting with right-sided weakness, decreased sensation on the right, diplopia and headache. Complete revascularization of occluded L PCA w/ mechanical thrombectomy.  Stroke: embolic multifocal posterior circulation infarcts with left P2 occlusion s/p IR with TICI3 reperfusion - likely embolic   Code Stroke CT head No acute abnormality. Old infarct L frontal lobe.   CTA head & neck L P2 occlusion. Mild neck atherosclerosis . No LVO anterior circulation. Mild mid BA  narrowing.  CT perfusion ischemia w/o visible core  CT Head - 04/16/19 - Areas of acute infarction are showing low-density and swelling but no macroscopic hemorrhage by CT.    Cerebral angio TICI3 reperfusion of occluded L PCA  MRI  L PCA (including thalamus), L>R cerebellum, R pontine and left midbrain infarct. Petechial hemorrhage seen in pons. Old L frontal infarcts.   LE Doppler no DVT - no evidence of deep vein thrombosis   2D Echo - EF 55-60%.  No cardiac source of emboli identified.   TCD bubble no HITS  TEE neg  UDS - positive for benzos - UDS done on 9/1  EEG - cortical dysfunction but no seizures.  LDL 88  HgbA1c 12.3  Lovenox 40 mg sq daily  for VTE prophylaxis  No antithrombotic prior to admission, finished DAPT 3-week course, now on aspirin alone   Therapy recommendations:  CIR -> D/t lack of post hospitalization family support - will pursue SNF - will need rapid covid testing prior to D/C  Disposition:  pending (SW trying to help family w/ placement, [place] MCD and [place])  Medically ready for d/c when bed found  Patient remains stable  Possible seizure  Full body rigid shaking hours post IR, L>R  Loaded with keppra  EEG - cortical dysfunction but no seizures.  Continue keppra 500mg  bid  Hypertension  No hx of HTN, on no home meds  Placed On cleviprex gtt for BP control post IR, now off . Discontinue amlodipine 10 . Now on lotensin to 10mg  bid  . BP stable . BP goal normotensive  Hyperlipidemia  Home meds:  No statin  LDL 88, goal < 70  HDL 39  On lipitor 20  Continue statin at discharge  Diabetes type II, new diagnosis, Uncontrolled Hyperglycemia, improving  HgbA1c 12.3, goal < 7.0  CBGs  continue metformin 1000mg  bid   off glipizde to 5 mg bid d/t hypoglycemia  glipizide 2.5mg  QA C -> 2.5mg  bid -> hypoglycemia 10/6 -> back to 2.5 mg qac  DM coordinator following - should glucoses remain elevated. May  benefit from SGLT-2inhibitior if he gets insurance   SSI 0-15 scale, 0-5 hs  Likely hypercoagulable state associated with uncontrolled DM  Dysphagia Malnutrition  . Secondary to stroke . Speech on board . D3 nectar thick liquids . Encourage po intake . Off IVF, lab stable . On glucerna shakes tid  UTI with fever, resolved  UA showed WBC > 50  Urine culture enterobactor and staph coag neg  Sensitive to cipro  Finished cipro 500mg  bid course (ends 9/8)  Fever resolved   Other Stroke Risk Factors   hx of stroke - small remote appearing infarcts in the left frontal lobe by CT and MRI  Other Active Problems  Microcytic anemia - iron 38, Ferritin 115 - put on iron tab bid.  Pt found on floor 04/23/19 by nursing staff. No obvious injury, complaints of pain, or neurologic changes.   Pt had 2-3 times slip off chair while sitting in chair. No fall, no injury. - now move to room 13 closer to nurse station.    Pt had decreased UOP 10/8, put on IVF @ 100 for 24 hours. - labs ok 06/01/19 am  Hospital day # 48   No change in status. Continue present treatment.  Await nursing home bed.  Antony Contras, MD  To contact Stroke Continuity provider, please refer to [email or web address] After hours, contact General Neurology

## 2019-06-02 ENCOUNTER — Inpatient Hospital Stay (HOSPITAL_COMMUNITY): Payer: Medicaid - Out of State

## 2019-06-02 LAB — GLUCOSE, CAPILLARY
Glucose-Capillary: 139 mg/dL — ABNORMAL HIGH (ref 70–99)
Glucose-Capillary: 236 mg/dL — ABNORMAL HIGH (ref 70–99)
Glucose-Capillary: 180 mg/dL — ABNORMAL HIGH (ref 70–99)
Glucose-Capillary: 192 mg/dL — ABNORMAL HIGH (ref 70–99)

## 2019-06-02 NOTE — Progress Notes (Signed)
STROKE TEAM PROGRESS NOTE   INTERVAL HISTORY Patient neurologically remains unchanged.  He did better on modified barium swallow today and diet has been slightly abraded but he is still not able to take thin liquids with a straw  Vitals:   06/02/19 0040 06/02/19 0447 06/02/19 0810 06/02/19 1126  BP: 115/80 (!) 120/91 116/73 113/73  Pulse: 77 83 78 76  Resp: 18 17 18 18   Temp: 98.6 F (37 C) 97.8 F (36.6 C) 98.7 F (37.1 C) 99 F (37.2 C)  TempSrc: Oral Oral Oral Oral  SpO2: 99% 99% 100% 98%  Weight:      Height:        CBC:  Recent Labs  Lab 05/28/19 0554 06/01/19 0416  WBC 5.6 6.2  HGB 11.6* 11.8*  HCT 36.0* 36.6*  MCV 72.6* 71.8*  PLT 236 283    Basic Metabolic Panel:  Recent Labs  Lab 05/28/19 0554 06/01/19 0416  NA 136 137  K 4.3 4.1  CL 103 103  CO2 24 24  GLUCOSE 133* 123*  BUN 9 8  CREATININE 0.73 0.74  CALCIUM 9.5 9.6    IMAGING last 24h Dg Swallowing Func-speech Pathology  Result Date: 06/02/2019 Objective Swallowing Evaluation: Type of Study: MBS-Modified Barium Swallow Study  Patient Details Name: Rodney Collier MRN: [id number] Date of Birth: 1966-07-12 Today's Date: 06/02/2019 Time: SLP Start Time (ACUTE ONLY): 0940 -SLP Stop Time (ACUTE ONLY): 1000 SLP Time Calculation (min) (ACUTE ONLY): 20 min Past Medical History: No past medical history on file. Past Surgical History: Past Surgical History: Procedure Laterality Date . BUBBLE STUDY  04/19/2019  Procedure: BUBBLE STUDY;  Surgeon: Chilton Siandolph, Tiffany, MD;  [facility];  Service: Cardiovascular;; . IR ANGIO EXTRACRAN SEL COM CAROTID INNOMINATE UNI R MOD SED  04/14/2019 . IR ANGIO INTRA EXTRACRAN SEL COM CAROTID INNOMINATE UNI L MOD SED  04/14/2019 . IR ANGIO VERTEBRAL SEL VERTEBRAL UNI L MOD SED  04/14/2019 . IR CT HEAD LTD  04/14/2019 . IR PERCUTANEOUS ART THROMBECTOMY/INFUSION INTRACRANIAL INC DIAG ANGIO  04/14/2019 . RADIOLOGY WITH ANESTHESIA N/A 04/14/2019  Procedure: IR WITH ANESTHESIA;  Surgeon:  Radiologist, Medication, MD;  Location: [facility] OR;  Service: Radiology;  Laterality: N/A; . TEE WITHOUT CARDIOVERSION N/A 04/19/2019  Procedure: TRANSESOPHAGEAL ECHOCARDIOGRAM (TEE);  Surgeon: Chilton Siandolph, Tiffany, MD;  [facility];  Service: Cardiovascular;  Laterality: N/A; HPI: Pt is a 53 y.o. M with no known PMH who presents with right sided weakness, decreased sensation on right, diplopia and headache. CT showing remote L frontal infarcts, CTA head/neck with L P2 occlusion, MRI with acute L PCA infarct including much of L thalamus, L > R cerebellum, R pons. Now s/p complete revascularization of occluded L PCA with mechanical thrombectomy. ETT 8/27-8/28; self extubated 8/28.  Subjective: Pt seen in radiology for MBS Assessment / Plan / Recommendation CHL IP CLINICAL IMPRESSIONS 06/02/2019 Clinical Impression Pt exhibits improvement in swallow function since MBS completed 04/18/2019. Pt continues to demonstrate right facial paresis, with anterior leakage of multiple textures. Swallow reflex continues to be triggered at the pyriform sinus on thin and nectar thick liquids, but triggers at the vallecular sinus on puree and solid textures. Pt exhibits very trace flash penetration of thin and nectar thick liquids via cup intermittently. SILENT ASPIRATION of thin liquids VIA STRAW was noted intermittently as well. Pt was able to swallow barium tablet with water without significant difficulty. Softer textures (dys3) continue to be recommended due to oral weakness, concern for pocketing and decreased bolus awareness.  Thin liquids advanced to thin liquid VIA CUP SIP ONLY. Meds may be given whole with either liquid or puree. Safe swallow precautions were sent to pt room with transport. SLP will continue to follow for assessment of advanced diet tolerance and education. SLP Visit Diagnosis Dysphagia, oropharyngeal phase (R13.12);Aphasia (R47.01) Impact on safety and function Mild aspiration risk   CHL IP TREATMENT  RECOMMENDATION 06/02/2019 Treatment Recommendations Therapy as outlined in treatment plan below   Prognosis 06/02/2019 Prognosis for Safe Diet Advancement Good Barriers to Reach Goals Language deficits;Severity of deficits Barriers/Prognosis Comment -- CHL IP DIET RECOMMENDATION 06/02/2019 SLP Diet Recommendations Dysphagia 3 (Mech soft) solids;Thin liquid Liquid Administration via Cup Medication Administration Whole meds with liquid Compensations Minimize environmental distractions;Small sips/bites;Slow rate;Lingual sweep for clearance of pocketing Postural Changes Seated upright at 90 degrees   CHL IP OTHER RECOMMENDATIONS 06/02/2019   Oral Care Recommendations Oral care BID     CHL IP FOLLOW UP RECOMMENDATIONS 06/02/2019 Follow up Recommendations Skilled Nursing facility;24 hour supervision/assistance   CHL IP FREQUENCY AND DURATION 06/02/2019 Speech Therapy Frequency (ACUTE ONLY) min 1 x/week Treatment Duration 2 weeks      CHL IP ORAL PHASE 06/02/2019 Oral Phase Impaired Oral - Nectar Cup Right anterior bolus loss Oral - Nectar Straw NT   Oral - Thin Cup Right anterior bolus loss   Oral - Puree WFL   Oral - Regular Right anterior bolus loss   Oral - Pill Delayed oral transit    CHL IP PHARYNGEAL PHASE 06/02/2019 Pharyngeal Phase Impaired Pharyngeal- Nectar Cup Delayed swallow initiation-pyriform sinuses;Penetration/Aspiration during swallow Pharyngeal Material does not enter airway;Material enters airway, remains ABOVE vocal cords then ejected out Pharyngeal- Nectar Straw NT   Pharyngeal- Thin Cup Delayed swallow initiation-pyriform sinuses;Penetration/Aspiration during swallow Pharyngeal Material does not enter airway Pharyngeal- Thin Straw Delayed swallow initiation-pyriform sinuses;Penetration/Aspiration during swallow Pharyngeal Material enters airway, passes BELOW cords without attempt by patient to eject out (silent aspiration) Pharyngeal- Puree Delayed swallow initiation-vallecula   Pharyngeal- Regular  Delayed swallow initiation-vallecula   Pharyngeal- Pill Delayed swallow initiation-pyriform sinuses      CHL IP CERVICAL ESOPHAGEAL PHASE 06/02/2019 Cervical Esophageal Phase [facility]. Quentin [facility], [facility], [place] Speech Language Pathologist Office: [phone] Pager: [phone] [name] 06/02/2019, 10:34 AM                PHYSICAL EXAM         Frail middle aged african [place] male not in distress. . Afebrile. Head is nontraumatic. Neck is supple without bruit.    Cardiac exam no murmur or gallop. Lungs are clear to auscultation. Distal pulses are well felt. Neurological Exam: awake, alert, eyes open, following simple commands. nonfluent language but able to have some words articulated, hypophonia much improved.  Slight dysconjugate eyes, left eye mild INO, right eye CN VI incomplete palsy, up and downward gaze palsy. PERRL. Not blinking to visual threat on the right.Rt hemianopia. Right facial droop, tongue midline, LUE 5/5 but significant ataxia. LLE 4/5 at least. On pain stimulation, slight withdraw of RLE but no movement of RUE. Increased muscle tone on the RUE. Positive triple reflex on the right. Sensation and gait not tested.   ASSESSMENT/PLAN Rodney Collier is a 53 y.o. male with no significant past medical history presenting with right-sided weakness, decreased sensation on the right, diplopia and headache. Complete revascularization of occluded L PCA w/ mechanical thrombectomy.  Stroke: embolic multifocal posterior circulation infarcts with left P2 occlusion s/p IR with TICI3 reperfusion - likely embolic  Code Stroke CT head No acute abnormality. Old infarct L frontal lobe.   CTA head & neck L P2 occlusion. Mild neck atherosclerosis . No LVO anterior circulation. Mild mid BA narrowing.  CT perfusion ischemia w/o visible core  CT Head - 04/16/19 - Areas of acute infarction are showing low-density and swelling but no macroscopic hemorrhage by CT.    Cerebral angio TICI3  reperfusion of occluded L PCA  MRI  L PCA (including thalamus), L>R cerebellum, R pontine and left midbrain infarct. Petechial hemorrhage seen in pons. Old L frontal infarcts.   LE Doppler no DVT - no evidence of deep vein thrombosis   2D Echo - EF 55-60%.  No cardiac source of emboli identified.   TCD bubble no HITS  TEE neg  UDS - positive for benzos - UDS done on 9/1  EEG - cortical dysfunction but no seizures.  LDL 88  HgbA1c 12.3  Lovenox 40 mg sq daily  for VTE prophylaxis  No antithrombotic prior to admission, finished DAPT 3-week course, now on aspirin alone   Therapy recommendations:  CIR -> D/t lack of post hospitalization family support - will pursue SNF - will need rapid covid testing prior to D/C  Disposition:  pending (SW trying to help family w/ placement, MA MCD and transfer back to [place])  Medically ready for d/c when bed found  Patient remains stable  Possible seizure  Full body rigid shaking hours post IR, L>R  Loaded with keppra  EEG - cortical dysfunction but no seizures.  Continue keppra 500mg  bid  Hypertension  No hx of HTN, on no home meds  Placed On cleviprex gtt for BP control post IR, now off . Discontinue amlodipine 10 . Now on lotensin to 10mg  bid  . BP stable . BP goal normotensive  Hyperlipidemia  Home meds:  No statin  LDL 88, goal < 70  HDL 39  On lipitor 20  Continue statin at discharge  Diabetes type II, new diagnosis, Uncontrolled Hyperglycemia, improving  HgbA1c 12.3, goal < 7.0  CBGs  continue metformin 1000mg  bid   off glipizde to 5 mg bid d/t hypoglycemia  glipizide 2.5mg  QA C -> 2.5mg  bid -> hypoglycemia 10/6 -> back to 2.5 mg qac  DM coordinator following - should glucoses remain elevated. May benefit from SGLT-2inhibitior if he gets insurance   SSI 0-15 scale, 0-5 hs  Likely hypercoagulable state associated with uncontrolled DM  Dysphagia Malnutrition  . Secondary to stroke . Speech on  board . Encourage po intake . Off IVF, lab stable . On glucerna shakes tid . Now cleared for D3 thin liquids in cup only, meds whole w/ liquids in cup  UTI with fever, resolved  UA showed WBC > 50  Urine culture enterobactor and staph coag neg  Sensitive to cipro  Finished cipro 500mg  bid course (ends 9/8)  Fever resolved   Other Stroke Risk Factors   hx of stroke - small remote appearing infarcts in the left frontal lobe by CT and MRI  Other Active Problems  Microcytic anemia - iron 38, Ferritin 115 - put on iron tab bid.  Pt found on floor 04/23/19 by nursing staff. No obvious injury, complaints of pain, or neurologic changes.   Pt had 2-3 times slip off chair while sitting in chair. No fall, no injury. - now move to room 13 closer to nurse station.    Pt had decreased UOP 10/8, put on IVF @ 100 for 24 hours. - labs  ok 06/01/19 am  Hospital day # 49    Slight improvement in his dysphagia.  I returned call from patient's brother asking for an update on his condition.  They want social worker to call them and give an update about efforts to move him to [place] Continue present treatment.  Await nursing home bed. Greater than 50% time during this 25-minute visit was spent on counseling and coordination of care and discussion with family and social worker and answering questions Delia Heady, MD  To contact Stroke Continuity provider, please refer to [email or web address]. After hours, contact General Neurology

## 2019-06-02 NOTE — TOC Progression Note (Signed)
Transition of Care [facility]) - Progression Note    Patient Details  Name: Rodney Collier MRN: [id number] Date of Birth: Apr 11, 1966  Transition [facility] CM/SW [place], [place] Phone Number: 06/02/2019, 4:56 PM  Clinical Narrative:   CSW made phone calls to follow up on placement for patient:  [place] [phone]) - Left voicemail for [facility], awaiting a call back.  [facility] [phone]) - Left voicemail for Apolonio Schneiders. Fax on voicemail is [phone], CSW to fax referral.   [facility] [phone]) - Left a voicemail for Butch Penny. Fax on voicemail is [phone], CSW to fax referral.  [place] [facility] [phone]) - Left a voicemail for Katie, awaiting a call back.  [place] [phone]) - Left a voicemail for Admissions, awaiting a call back.  No bed offers at this time, CSW To follow.      Expected Discharge Plan: [place] Barriers to Discharge: SNF Pending bed offer, Homeless with medical needs, Other (comment)(needs placement in [place])  Expected Discharge Plan and Services Expected Discharge Plan: [place] Choice: [place]                                         Social Determinants of Health (SDOH) Interventions    Readmission Risk Interventions No flowsheet data found.

## 2019-06-02 NOTE — Progress Notes (Signed)
PT Cancellation Note  Patient Details Name: Rodney Collier MRN: [id number] DOB: 04/03/1966   Cancelled Treatment:    Reason Eval/Treat Not Completed: Patient declined, no reason specified. Upon arrival to room PT finds that patient had removed condom catheter with pee covering floor near bed. PT assists in cleaning up floor and attempts to encourage pt to mobilize however pt refuses. Pt speaking in french but clearly responds "no" when asked if he would like to mobilize with PT. PT attempts to provide further encouragement however pt continues to refuse. PT will attempt to follow up as time allows.   Zenaida Niece 06/02/2019, 2:20 PM

## 2019-06-02 NOTE — Progress Notes (Signed)
Modified Barium Swallow Progress Note  Patient Details  Name: Rodney Collier MRN: [id number] Date of Birth: 11-07-1965  Today's Date: 06/02/2019  Modified Barium Swallow completed.  Full report located under Chart Review in the Imaging Section.  Brief recommendations include the following:  Clinical Impression Pt exhibits improvement in swallow function since MBS completed 04/18/2019. Pt continues to demonstrate right facial paresis, with anterior leakage of multiple textures. Swallow reflex continues to be triggered at the pyriform sinus on thin and nectar thick liquids, but triggers at the vallecular sinus on puree and solid textures. Pt exhibits very trace flash penetration of thin and nectar thick liquids via cup intermittently. SILENT ASPIRATION of thin liquids VIA STRAW was noted intermittently as well. Pt was able to swallow barium tablet with water without significant difficulty. Softer textures (dys3) continue to be recommended due to oral weakness, concern for pocketing and decreased bolus awareness. Thin liquids advanced to thin liquid VIA CUP SIP ONLY. Meds may be given whole with either liquid or puree. Safe swallow precautions were sent to pt room with transport. SLP will continue to follow for assessment of advanced diet tolerance and education.   Swallow Evaluation Recommendations  SLP Diet Recommendations: Dysphagia 3 (Mech soft) solids;Thin liquid   Liquid Administration via: Cup   Medication Administration: Whole meds with liquid   Supervision: Staff to assist with self feeding;Patient able to self feed;Full supervision/cueing for compensatory strategies   Compensations: Minimize environmental distractions;Small sips/bites;Slow rate;Lingual sweep for clearance of pocketing   Postural Changes: Seated upright at 90 degrees   Oral Care Recommendations: Oral care BID   Rodney Collier, [facility], [place] Speech Language Pathologist Office: [phone] Pager:  [phone]  [name] 06/02/2019,10:37 AM

## 2019-06-03 LAB — GLUCOSE, CAPILLARY
Glucose-Capillary: 130 mg/dL — ABNORMAL HIGH (ref 70–99)
Glucose-Capillary: 105 mg/dL — ABNORMAL HIGH (ref 70–99)
Glucose-Capillary: 105 mg/dL — ABNORMAL HIGH (ref 70–99)

## 2019-06-03 NOTE — Progress Notes (Signed)
Physical Therapy Treatment Patient Details Name: Rodney Collier MRN: [id number] DOB: April 09, 1966 Today's Date: 06/03/2019    History of Present Illness Pt is a 53 y.o. M with no known PMH who presents with right sided weakness, decreased sensation on right, diplopia and headache. CT showing remote L frontal infarcts, CTA head/neck with L P2 occlusion, MRI with acute L PCA infarct including much of L thalamus, L > R cerebellum, R pons. Now s/p complete revascularization of occluded L PCA with mechanical thrombectomy. ETT 8/27-8/28; self extubated 8/28.    PT Comments    Pt tolerated treatment well, performing transfer training, weight shifting and RLE exercise to preserve ROM.  Pt is impulsive and requires PT verbal and tactile cues to slow mobility and instruction to improve mobility technique and reduce falls risk, especially during transfers. Pt will continue to benefit from PT POC to reduce falls risk and caregiver burden.  Follow Up Recommendations  SNF     Equipment Recommendations  Wheelchair (measurements PT);Wheelchair cushion (measurements PT);3in1 (PT);Other (comment)    Recommendations for Other Services       Precautions / Restrictions Precautions Precautions: Fall Precaution Comments: R hemiparesis and inattention. Restrictions Weight Bearing Restrictions: No    Mobility  Bed Mobility Overal bed mobility: Needs Assistance Bed Mobility: Supine to Sit Rolling: Min assist     Sit to supine: Mod assist   General bed mobility comments: pt requiring physical assistance for RLE management  Transfers Overall transfer level: Needs assistance Equipment used: None Transfers: Stand Pivot Transfers Sit to Stand: Mod assist Stand pivot transfers: Mod assist       General transfer comment: pt performing 4 sit to stand reps from recliner and practicing weight shifting with R knee block.   Ambulation/Gait                 Stairs             Wheelchair  Mobility    Modified Rankin (Stroke Patients Only) Modified Rankin (Stroke Patients Only) Pre-Morbid Rankin Score: No symptoms Modified Rankin: Moderately severe disability     Balance Overall balance assessment: Needs assistance Sitting-balance support: Feet supported;Single extremity supported Sitting balance-Leahy Scale: Fair Sitting balance - Comments: close supervision   Standing balance support: Single extremity supported Standing balance-Leahy Scale: Poor Standing balance comment: modA and R knee block for standing balance                            Cognition Arousal/Alertness: Awake/alert Behavior During Therapy: Impulsive Overall Cognitive Status: Impaired/Different from baseline Area of Impairment: Safety/judgement;Awareness;Problem solving;Following commands                     Memory: Decreased recall of precautions Following Commands: Follows one step commands consistently Safety/Judgement: Decreased awareness of safety;Decreased awareness of deficits            Exercises Other Exercises Other Exercises: self PROM R knee extension from sitting position utilizing LLE to move RLE- 10 reps Other Exercises: PROM R hip and knee flexion and extension utilizing towel around R ankle and LUE to pull and push RLE-10 reps    General Comments        Pertinent Vitals/Pain Pain Assessment: Faces Faces Pain Scale: Hurts even more Pain Location: RLE Pain Descriptors / Indicators: Grimacing Pain Intervention(s): Patient requesting pain meds-RN notified    Home Living  Prior Function            PT Goals (current goals can now be found in the care plan section) Acute Rehab PT Goals Patient Stated Goal: to walk Progress towards PT goals: Progressing toward goals    Frequency    Min 3X/week      PT Plan Current plan remains appropriate    Co-evaluation              AM-PAC PT "6 Clicks" Mobility    Outcome Measure  Help needed turning from your back to your side while in a flat bed without using bedrails?: A Little Help needed moving from lying on your back to sitting on the side of a flat bed without using bedrails?: A Little Help needed moving to and from a bed to a chair (including a wheelchair)?: A Lot Help needed standing up from a chair using your arms (e.g., wheelchair or bedside chair)?: A Lot Help needed to walk in hospital room?: Total Help needed climbing 3-5 steps with a railing? : Total 6 Click Score: 12    End of Session Equipment Utilized During Treatment: Gait belt Activity Tolerance: Patient tolerated treatment well Patient left: in bed;with call bell/phone within reach;with bed alarm set Nurse Communication: Mobility status;Patient requests pain meds PT Visit Diagnosis: Unsteadiness on feet (R26.81);Muscle weakness (generalized) (M62.81);Difficulty in walking, not elsewhere classified (R26.2);Hemiplegia and hemiparesis Hemiplegia - Right/Left: Right Hemiplegia - dominant/non-dominant: Non-dominant Hemiplegia - caused by: Cerebral infarction     Time: 7408-1448 PT Time Calculation (min) (ACUTE ONLY): 27 min  Charges:  $Therapeutic Exercise: 8-22 mins $Therapeutic Activity: 8-22 mins                     Arlyss Gandy, PT, DPT Acute Rehabilitation Pager: [phone]    Arlyss Gandy 06/03/2019, 11:17 AM

## 2019-06-03 NOTE — Progress Notes (Signed)
STROKE TEAM PROGRESS NOTE   INTERVAL HISTORY Patient neurologically remains unchanged.  He is lying in bed. Yet no SNF bed available.  Vitals:   06/02/19 2018 06/03/19 0004 06/03/19 0442 06/03/19 1219  BP: 122/66 108/72 109/78 111/71  Pulse: 88 83 77 91  Resp: 18 18 18 16   Temp: 97.6 F (36.4 C) 98 F (36.7 C) (!) 97.5 F (36.4 C) 98.2 F (36.8 C)  TempSrc: Oral Oral Oral Oral  SpO2: 98% 100% 99% 98%  Weight:      Height:        CBC:  Recent Labs  Lab 05/28/19 0554 06/01/19 0416  WBC 5.6 6.2  HGB 11.6* 11.8*  HCT 36.0* 36.6*  MCV 72.6* 71.8*  PLT 236 240    Basic Metabolic Panel:  Recent Labs  Lab 05/28/19 0554 06/01/19 0416  NA 136 137  K 4.3 4.1  CL 103 103  CO2 24 24  GLUCOSE 133* 123*  BUN 9 8  CREATININE 0.73 0.74  CALCIUM 9.5 9.6    IMAGING last 24h No results found.   PHYSICAL EXAM         Frail middle aged african [place] male not in distress. . Afebrile. Head is nontraumatic. Neck is supple without bruit.    Cardiac exam no murmur or gallop. Lungs are clear to auscultation. Distal pulses are well felt. Neurological Exam: awake, alert, eyes open, following simple commands. nonfluent language but able to have some words articulated, hypophonia much improved.  Slight dysconjugate eyes, left eye mild INO, right eye CN VI incomplete palsy, up and downward gaze palsy. PERRL. Not blinking to visual threat on the right.Rt hemianopia. Right facial droop, tongue midline, LUE 5/5 but significant ataxia. LLE 4/5 at least. On pain stimulation, slight withdraw of RLE but no movement of RUE. Increased muscle tone on the RUE. Positive triple reflex on the right. Sensation and gait not tested.   ASSESSMENT/PLAN Rodney Collier is a 53 y.o. male with no significant past medical history presenting with right-sided weakness, decreased sensation on the right, diplopia and headache. Complete revascularization of occluded L PCA w/ mechanical thrombectomy.  Stroke:  embolic multifocal posterior circulation infarcts with left P2 occlusion s/p IR with TICI3 reperfusion - likely embolic   Code Stroke CT head No acute abnormality. Old infarct L frontal lobe.   CTA head & neck L P2 occlusion. Mild neck atherosclerosis . No LVO anterior circulation. Mild mid BA narrowing.  CT perfusion ischemia w/o visible core  CT Head - 04/16/19 - Areas of acute infarction are showing low-density and swelling but no macroscopic hemorrhage by CT.    Cerebral angio TICI3 reperfusion of occluded L PCA  MRI  L PCA (including thalamus), L>R cerebellum, R pontine and left midbrain infarct. Petechial hemorrhage seen in pons. Old L frontal infarcts.   LE Doppler no DVT - no evidence of deep vein thrombosis   2D Echo - EF 55-60%.  No cardiac source of emboli identified.   TCD bubble no HITS  TEE neg  UDS - positive for benzos - UDS done on 9/1  EEG - cortical dysfunction but no seizures.  LDL 88  HgbA1c 12.3  Lovenox 40 mg sq daily  for VTE prophylaxis  No antithrombotic prior to admission, finished DAPT 3-week course, now on aspirin alone   Therapy recommendations:  CIR -> D/t lack of post hospitalization family support - will pursue SNF - will need rapid covid testing prior to D/C  Disposition:  pending (  SW trying to help family w/ placement, MA MCD and transfer back to [place])  Medically ready for d/c when bed found  Patient remains stable  Possible seizure  Full body rigid shaking hours post IR, L>R  Loaded with keppra  EEG - cortical dysfunction but no seizures.  Continue keppra 500mg  bid  Hypertension  No hx of HTN, on no home meds  Placed On cleviprex gtt for BP control post IR, now off . Discontinue amlodipine 10 . Now on lotensin to 10mg  bid  . BP stable . BP goal normotensive  Hyperlipidemia  Home meds:  No statin  LDL 88, goal < 70  HDL 39  On lipitor 20  Continue statin at discharge  Diabetes type II, new diagnosis,  Uncontrolled Hyperglycemia, improving  HgbA1c 12.3, goal < 7.0  CBGs  continue metformin 1000mg  bid   off glipizde to 5 mg bid d/t hypoglycemia  glipizide 2.5mg  QA C -> 2.5mg  bid -> hypoglycemia 10/6 -> back to 2.5 mg qac  DM coordinator following - should glucoses remain elevated. May benefit from SGLT-2inhibitior if he gets insurance   SSI 0-15 scale, 0-5 hs  Likely hypercoagulable state associated with uncontrolled DM  Dysphagia Malnutrition  . Secondary to stroke . Speech on board . Encourage po intake . Off IVF, lab stable . On glucerna shakes tid . Now cleared for D3 thin liquids in cup only, meds whole w/ liquids in cup  UTI with fever, resolved  UA showed WBC > 50  Urine culture enterobactor and staph coag neg  Sensitive to cipro  Finished cipro 500mg  bid course (ends 9/8)  Fever resolved   Other Stroke Risk Factors   hx of stroke - small remote appearing infarcts in the left frontal lobe by CT and MRI  Other Active Problems  Microcytic anemia - iron 38, Ferritin 115 - put on iron tab bid.  Pt found on floor 04/23/19 by nursing staff. No obvious injury, complaints of pain, or neurologic changes.   Pt had 2-3 times slip off chair while sitting in chair. No fall, no injury. - now move to room 13 closer to nurse station.    Pt had decreased UOP 10/8, put on IVF @ 100 for 24 hours. - labs ok 06/01/19 am  Hospital day # 41   D/w social worker yet no possibibilty of SNF transfer in [place]. Continue present treatment.Social worker will call pt`s brother for an update as requested. 12/8, MD  To contact Stroke Continuity provider, please refer to 06/03/19. After hours, contact General Neurology

## 2019-06-03 NOTE — Progress Notes (Signed)
Patient tearful. When asked what's wrong patient said "look at me." Support given to patient, continuing to assess.

## 2019-06-04 LAB — COMPREHENSIVE METABOLIC PANEL
ALT: 25 U/L (ref 0–44)
AST: 20 U/L (ref 15–41)
Albumin: 3.1 g/dL — ABNORMAL LOW (ref 3.5–5.0)
Alkaline Phosphatase: 68 U/L (ref 38–126)
Anion gap: 10 (ref 5–15)
BUN: 9 mg/dL (ref 6–20)
CO2: 27 mmol/L (ref 22–32)
Calcium: 9.8 mg/dL (ref 8.9–10.3)
Chloride: 102 mmol/L (ref 98–111)
Creatinine, Ser: 0.81 mg/dL (ref 0.61–1.24)
GFR calc Af Amer: >60 mL/min (ref >60)
GFR calc non Af Amer: >60 mL/min (ref >60)
Glucose, Bld: 114 mg/dL — ABNORMAL HIGH (ref 70–99)
Potassium: 3.9 mmol/L (ref 3.5–5.1)
Sodium: 139 mmol/L (ref 135–145)
Total Bilirubin: 0.8 mg/dL (ref 0.3–1.2)
Total Protein: 6.6 g/dL (ref 6.5–8.1)

## 2019-06-04 LAB — CBC
HCT: 38.6 % — ABNORMAL LOW (ref 39.0–52.0)
Hemoglobin: 11.9 g/dL — ABNORMAL LOW (ref 13.0–17.0)
MCH: 22.7 pg — ABNORMAL LOW (ref 26.0–34.0)
MCHC: 30.8 g/dL (ref 30.0–36.0)
MCV: 73.5 fL — ABNORMAL LOW (ref 80.0–100.0)
Platelets: 325 10*3/uL (ref 150–400)
RBC: 5.25 MIL/uL (ref 4.22–5.81)
RDW: 14.4 % (ref 11.5–15.5)
WBC: 5.7 10*3/uL (ref 4.0–10.5)
nRBC: 0 % (ref 0.0–0.2)

## 2019-06-04 LAB — GLUCOSE, CAPILLARY
Glucose-Capillary: 94 mg/dL (ref 70–99)
Glucose-Capillary: 113 mg/dL — ABNORMAL HIGH (ref 70–99)
Glucose-Capillary: 138 mg/dL — ABNORMAL HIGH (ref 70–99)
Glucose-Capillary: 72 mg/dL (ref 70–99)
Glucose-Capillary: 111 mg/dL — ABNORMAL HIGH (ref 70–99)

## 2019-06-04 NOTE — Progress Notes (Signed)
STROKE TEAM PROGRESS NOTE   INTERVAL HISTORY Patient neurologically remains unchanged.  He is lying in bed.  Still has right hemiplegia.  Lab today unremarkable.  Still pending SNF bed.  Vitals:   06/04/19 0353 06/04/19 0806 06/04/19 1155 06/04/19 1607  BP: 132/86 109/71 116/74 119/81  Pulse: 88 83 92 85  Resp: 16 19 18 18   Temp: 98.1 F (36.7 C) 97.7 F (36.5 C) 99 F (37.2 C) 98 F (36.7 C)  TempSrc:  Oral Oral Oral  SpO2: 99% 99% 97% 100%  Weight:      Height:        CBC:  Recent Labs  Lab 06/01/19 0416 06/04/19 0530  WBC 6.2 5.7  HGB 11.8* 11.9*  HCT 36.6* 38.6*  MCV 71.8* 73.5*  PLT 283 325    Basic Metabolic Panel:  Recent Labs  Lab 06/01/19 0416 06/04/19 0530  NA 137 139  K 4.1 3.9  CL 103 102  CO2 24 27  GLUCOSE 123* 114*  BUN 8 9  CREATININE 0.74 0.81  CALCIUM 9.6 9.8    IMAGING last 24h No results found.   PHYSICAL EXAM         Frail middle aged african 06/06/19 male not in distress. . Afebrile. Head is nontraumatic. Neck is supple without bruit.    Cardiac exam no murmur or gallop. Lungs are clear to auscultation. Distal pulses are well felt. Neurological Exam: awake, alert, eyes open, following simple commands. nonfluent language but able to have some words articulated, hypophonia much improved.  Slight dysconjugate eyes, left eye mild INO, right eye CN VI incomplete palsy, up and downward gaze palsy. PERRL. Not blinking to visual threat on the right.Rt hemianopia. Right facial droop, tongue midline, LUE 5/5 but significant ataxia. LLE 4/5 at least. On pain stimulation, slight withdraw of RLE but no movement of RUE. Increased muscle tone on the RUE. Positive triple reflex on the right. Sensation and gait not tested.   ASSESSMENT/PLAN [name] with no significant past medical history presenting with right-sided weakness, decreased sensation on the right, diplopia and headache. Complete revascularization of occluded L PCA  w/ mechanical thrombectomy.  Stroke: embolic multifocal posterior circulation infarcts with left P2 occlusion s/p IR with TICI3 reperfusion - likely embolic   Code Stroke CT head No acute abnormality. Old infarct L frontal lobe.   CTA head & neck L P2 occlusion. Mild neck atherosclerosis . No LVO anterior circulation. Mild mid BA narrowing.  CT perfusion ischemia w/o visible core  CT Head - 04/16/19 - Areas of acute infarction are showing low-density and swelling but no macroscopic hemorrhage by CT.    Cerebral angio TICI3 reperfusion of occluded L PCA  MRI  L PCA (including thalamus), L>R cerebellum, R pontine and left midbrain infarct. Petechial hemorrhage seen in pons. Old L frontal infarcts.   LE Doppler no DVT - no evidence of deep vein thrombosis   2D Echo - EF 55-60%.  No cardiac source of emboli identified.   TCD bubble no HITS  TEE neg  UDS - positive for benzos - UDS done on 9/1  EEG - cortical dysfunction but no seizures.  LDL 88  HgbA1c 12.3  Lovenox 40 mg sq daily  for VTE prophylaxis  No antithrombotic prior to admission, finished DAPT 3-week course, now on aspirin alone   Therapy recommendations:  CIR -> D/t lack of post hospitalization family support - will pursue SNF - will need rapid covid  testing prior to D/C  Disposition:  pending (SW trying to help family w/ placement, MA MCD and transfer back to [place])  Medically ready for d/c when bed found  Patient remains stable  Possible seizure  Full body rigid shaking hours post IR, L>R  Loaded with keppra  EEG - cortical dysfunction but no seizures.  Continue keppra 500mg  bid  Hypertension  No hx of HTN, on no home meds  Placed On cleviprex gtt for BP control post IR, now off . Discontinue amlodipine 10 . Now on lotensin to 10mg  bid  . BP stable . BP goal normotensive  Hyperlipidemia  Home meds:  No statin  LDL 88, goal < 70  HDL 39  On lipitor 20  Continue statin at  discharge  Diabetes type II, new diagnosis, Uncontrolled Hyperglycemia, improving  HgbA1c 12.3, goal < 7.0  CBGs  continue metformin 1000mg  bid   off glipizde to 5 mg bid d/t hypoglycemia  glipizide 2.5mg  QA C -> 2.5mg  bid -> hypoglycemia 10/6 -> back to 2.5 mg qac  DM coordinator following - should glucoses remain elevated. May benefit from SGLT-2inhibitior if he gets insurance   SSI 0-15 scale, 0-5 qhs  Likely hypercoagulable state associated with uncontrolled DM  Dysphagia Malnutrition  . Secondary to stroke . Speech on board . Encourage po intake . Off IVF, lab stable . On glucerna shakes tid . Now cleared for D3 thin liquids in cup only, meds whole w/ liquids in cup  UTI with fever, resolved  UA showed WBC > 50  Urine culture enterobactor and staph coag neg  Sensitive to cipro  Finished cipro 500mg  bid course (ends 9/8)  Fever resolved   Other Stroke Risk Factors   hx of stroke - small remote appearing infarcts in the left frontal lobe by CT and MRI  Other Active Problems  Microcytic anemia - iron 38, Ferritin 115 - put on iron tab bid.  Pt found on floor 04/23/19 by nursing staff. No obvious injury, complaints of pain, or neurologic changes.   Pt had 2-3 times slip off chair while sitting in chair. No fall, no injury. - now move to room 13 closer to nurse station.    Pt had decreased UOP 10/8, put on IVF @ 100 for 24 hours.    Hospital day # 81   Rosalin Hawking, MD PhD Stroke Neurology 06/04/2019 6:09 PM   To contact Stroke Continuity provider, please refer to [email or web address] After hours, contact General Neurology

## 2019-06-05 LAB — GLUCOSE, CAPILLARY
Glucose-Capillary: 106 mg/dL — ABNORMAL HIGH (ref 70–99)
Glucose-Capillary: 37 mg/dL — CL (ref 70–99)
Glucose-Capillary: 55 mg/dL — ABNORMAL LOW (ref 70–99)
Glucose-Capillary: 97 mg/dL (ref 70–99)
Glucose-Capillary: 119 mg/dL — ABNORMAL HIGH (ref 70–99)
Glucose-Capillary: 106 mg/dL — ABNORMAL HIGH (ref 70–99)

## 2019-06-05 MED ORDER — GLUCOSE 40 % PO GEL
ORAL | Status: AC
  Administered 2019-06-05: 37.5 g
  Filled 2019-06-05: qty 1

## 2019-06-05 NOTE — Progress Notes (Signed)
STROKE TEAM PROGRESS NOTE   INTERVAL HISTORY Patient neurologically remains unchanged.  He is lying in bed.  Still has right hemiplegia.  He is very frustrated about his not able to move on the right. Still pending SNF bed.  Vitals:   06/04/19 1607 06/04/19 1928 06/05/19 0052 06/05/19 0401  BP: 119/81 124/79 131/88 115/80  Pulse: 85 89 88 87  Resp: 18 18 18 16   Temp: 98 F (36.7 C) 98.3 F (36.8 C) 98 F (36.7 C) 97.7 F (36.5 C)  TempSrc: Oral Oral Axillary Oral  SpO2: 100% 100% 100% 100%  Weight:      Height:        CBC:  Recent Labs  Lab 06/01/19 0416 06/04/19 0530  WBC 6.2 5.7  HGB 11.8* 11.9*  HCT 36.6* 38.6*  MCV 71.8* 73.5*  PLT 283 527    Basic Metabolic Panel:  Recent Labs  Lab 06/01/19 0416 06/04/19 0530  NA 137 139  K 4.1 3.9  CL 103 102  CO2 24 27  GLUCOSE 123* 114*  BUN 8 9  CREATININE 0.74 0.81  CALCIUM 9.6 9.8    IMAGING last 24h No results found.   PHYSICAL EXAM          Temp:  [97.7 F (36.5 C)-98.6 F (37 C)] 98.6 F (37 C) (10/18 1154) Pulse Rate:  [85-89] 86 (10/18 1154) Resp:  [16-18] 18 (10/18 1154) BP: (102-131)/(77-88) 116/77 (10/18 1154) SpO2:  [99 %-100 %] 100 % (10/18 1154)  General - Well nourished, well developed, in no apparent distress.  Ophthalmologic - fundi not visualized due to noncooperation.  Cardiovascular - Regular rhythm and rate.  Neuro - awake, alert, eyes open, following simple commands. Able to have some words articulated, hypophonia much improved, still no sentence yet.  Incomplete left gaze bilaterally, right gaze complete, up and downward gaze palsy. PERRL. Not blinking to visual threat on the right. Rt hemianopia. Right facial droop, tongue midline, LUE 5/5 but significant ataxia. LLE 4/5 at least. On pain stimulation, slight withdraw of RLE but no movement of RUE. Diminished tone on the right. Positive triple reflex on the right. Sensation decreased on the left, and gait not  tested.   ASSESSMENT/PLAN [name] with no significant past medical history presenting with right-sided weakness, decreased sensation on the right, diplopia and headache. Complete revascularization of occluded L PCA w/ mechanical thrombectomy.  Stroke: embolic multifocal posterior circulation infarcts with left P2 occlusion s/p IR with TICI3 reperfusion - likely embolic   Code Stroke CT head No acute abnormality. Old infarct L frontal lobe.   CTA head & neck L P2 occlusion. Mild neck atherosclerosis . No LVO anterior circulation. Mild mid BA narrowing.  CT perfusion ischemia w/o visible core  CT Head - 04/16/19 - Areas of acute infarction are showing low-density and swelling but no macroscopic hemorrhage by CT.    Cerebral angio TICI3 reperfusion of occluded L PCA  MRI  L PCA (including thalamus), L>R cerebellum, R pontine and left midbrain infarct. Petechial hemorrhage seen in pons. Old L frontal infarcts.   LE Doppler no DVT - no evidence of deep vein thrombosis   2D Echo - EF 55-60%.  No cardiac source of emboli identified.   TCD bubble no HITS  TEE neg  UDS - positive for benzos - UDS done on 9/1  EEG - cortical dysfunction but no seizures.  LDL 88  HgbA1c 12.3  Lovenox 40 mg sq daily  for VTE prophylaxis  No antithrombotic prior to admission, finished DAPT 3-week course, now on aspirin alone   Therapy recommendations:  CIR -> D/t lack of post hospitalization family support - will pursue SNF - will need rapid covid testing prior to D/C  Disposition:  pending (SW trying to help family w/ placement, MA MCD and transfer back to [place])  Medically ready for d/c when bed found  Patient remains stable  Possible seizure  Full body rigid shaking hours post IR, L>R  Loaded with keppra  EEG - cortical dysfunction but no seizures.  Continue keppra 500mg  bid  Hypertension  No hx of HTN, on no home meds  Placed On cleviprex gtt for BP  control post IR, now off . Discontinue amlodipine 10 . Now on lotensin to 10mg  bid  . BP stable . BP goal normotensive  Hyperlipidemia  Home meds:  No statin  LDL 88, goal < 70  HDL 39  On lipitor 20  Continue statin at discharge  Diabetes type II, new diagnosis, Uncontrolled Hyperglycemia, improving  HgbA1c 12.3, goal < 7.0  CBGs  continue metformin 1000mg  bid   off glipizde to 5 mg bid d/t hypoglycemia  glipizide 2.5mg  QA C -> 2.5mg  bid -> hypoglycemia 10/6 -> back to 2.5 mg qac ->glucose lowish 10/18 -> glipizide discontinued  DM coordinator following - should glucoses remain elevated. May benefit from SGLT-2inhibitior if he gets insurance   SSI 0-15 scale, 0-5 qhs  Dysphagia Malnutrition  . Secondary to stroke . Speech on board . Encourage po intake . Off IVF, lab stable . On glucerna shakes tid . Now cleared for D3 thin liquids in cup only, meds whole w/ liquids in cup  UTI with fever, resolved  UA showed WBC > 50  Urine culture enterobactor and staph coag neg  Sensitive to cipro  Finished cipro 500mg  bid course (ends 9/8)  Fever resolved   Other Stroke Risk Factors   hx of stroke - small remote appearing infarcts in the left frontal lobe by CT and MRI  Other Active Problems  Microcytic anemia - iron 38, Ferritin 115 - put on iron tab bid.  Pt found on floor 04/23/19 by nursing staff. No obvious injury, complaints of pain, or neurologic changes.   Pt had 2-3 times slip off chair while sitting in chair. No fall, no injury. - now move to room 13 closer to nurse station.    Pt had decreased UOP 10/8, put on IVF @ 100 for 24 hours.    Hospital day # 26   11/8, MD PhD Stroke Neurology 06/05/2019 12:38 PM    To contact Stroke Continuity provider, please refer to 12/8. After hours, contact General Neurology

## 2019-06-05 NOTE — Progress Notes (Signed)
Patient is feeling upset,  Saying that he is taking too many medications. Refused his urecholine,  Metformin and lipitor --hiis evening medicines.

## 2019-06-06 LAB — GLUCOSE, CAPILLARY
Glucose-Capillary: 116 mg/dL — ABNORMAL HIGH (ref 70–99)
Glucose-Capillary: 249 mg/dL — ABNORMAL HIGH (ref 70–99)
Glucose-Capillary: 102 mg/dL — ABNORMAL HIGH (ref 70–99)
Glucose-Capillary: 178 mg/dL — ABNORMAL HIGH (ref 70–99)

## 2019-06-06 NOTE — Progress Notes (Signed)
Nutrition Follow-up  DOCUMENTATION CODES:   Not applicable  INTERVENTION:  Continue with Glucerna Shake po TID, each supplement provides 220 kcal and 10 grams of protein  NUTRITION DIAGNOSIS:   Inadequate oral intake related to inability to eat as evidenced by NPO status. Improved; diet advanced  GOAL:   Patient will meet greater than or equal to 90% of their needs ; progressing   MONITOR:   PO intake, Supplement acceptance, Diet advancement, Skin, Weight trends, Labs, I & O's  REASON FOR ASSESSMENT:   Ventilator    ASSESSMENT:   Rodney Collier is a 53 y.o. male with no significant past medical history presenting with right-sided weakness, decreased sensation on the right, diplopia and headache. Complete revascularization of occluded L PCA w/ mechanical thrombectomy.  Patient currently on dysphagia 3 diet with thin liquids. Meal completion has been  50-100% of most meals; noted 25% x 1 meal over the past 8 recorded intakes. Patient reports consuming Glucerna nutrition supplement and likes the [employer]. RD to continue with current orders to aid in caloric and protein needs. Patient encouraged to continue with good po intake of meals and supplements. SNF placement pending  Medications reviewed and include: SSI, metformin, keppra  Labs: CBGS 106-119  Diet Order:   Diet Order            DIET DYS 3 Room service appropriate? Yes; Fluid consistency: Thin  Diet effective now              EDUCATION NEEDS:   Not appropriate for education at this time  Skin:  Skin Assessment: Reviewed RN Assessment Skin Integrity Issues:: Other (Comment) Other: N/A  Last BM:  10/17; type 4; large;brown  Height:   Ht Readings from Last 1 Encounters:  04/19/19 6' (1.829 m)    Weight:   Wt Readings from Last 1 Encounters:  04/19/19 77 kg    Ideal Body Weight:  80.9 kg  BMI:  Body mass index is 23.02 kg/m.  Estimated Nutritional Needs:   Kcal:   2000-2200  Protein:  100-115 grams  Fluid:  > 2.0 L   Lajuan Lines, RD, LDN Clinical Nutrition [facility] Telephone [phone] After Hours/Weekend Pager: [phone]

## 2019-06-06 NOTE — Progress Notes (Signed)
STROKE TEAM PROGRESS NOTE   INTERVAL HISTORY No acute event overnight. Speaking spanish to me in a dysarthric voice. Apparently upset about not able to moving on the right side of body.   Vitals:   06/05/19 2043 06/06/19 0021 06/06/19 0456 06/06/19 0800  BP: 121/83 117/76 116/71 122/74  Pulse: 88 82 88 90  Resp: 18 18 18 16   Temp: 98 F (36.7 C) 98 F (36.7 C) 97.6 F (36.4 C) 98.1 F (36.7 C)  TempSrc: Oral Oral Oral Oral  SpO2: 100% 99% 100% 100%  Weight:      Height:        CBC:  Recent Labs  Lab 06/01/19 0416 06/04/19 0530  WBC 6.2 5.7  HGB 11.8* 11.9*  HCT 36.6* 38.6*  MCV 71.8* 73.5*  PLT 283 325    Basic Metabolic Panel:  Recent Labs  Lab 06/01/19 0416 06/04/19 0530  NA 137 139  K 4.1 3.9  CL 103 102  CO2 24 27  GLUCOSE 123* 114*  BUN 8 9  CREATININE 0.74 0.81  CALCIUM 9.6 9.8    IMAGING last 24h No results found.   PHYSICAL EXAM   Temp:  [97.6 F (36.4 C)-98.9 F (37.2 C)] 98.1 F (36.7 C) (10/19 0800) Pulse Rate:  [82-90] 90 (10/19 0800) Resp:  [16-18] 16 (10/19 0800) BP: (116-137)/(71-83) 122/74 (10/19 0800) SpO2:  [99 %-100 %] 100 % (10/19 0800)  General - Well nourished, well developed, in no apparent distress.  Ophthalmologic - fundi not visualized due to noncooperation.  Cardiovascular - Regular rhythm and rate.  Neuro - awake, alert, eyes open, following simple commands. Able to have some words articulated, hypophonia much improved, still no sentence yet.  Incomplete left gaze bilaterally, right gaze complete, up and downward gaze palsy. PERRL. Not blinking to visual threat on the right. Rt hemianopia. Right facial droop, tongue midline, LUE 5/5 but significant ataxia. LLE 4/5 at least. On pain stimulation, slight withdraw of RLE but no movement of RUE. Diminished tone on the right. Positive triple reflex on the right. Sensation decreased on the left, and gait not tested.   ASSESSMENT/PLAN Mr. Edilberto Roosevelt is a 53 y.o. male with  no significant past medical history presenting with right-sided weakness, decreased sensation on the right, diplopia and headache. Complete revascularization of occluded L PCA w/ mechanical thrombectomy.  Stroke: embolic multifocal posterior circulation infarcts with left P2 occlusion s/p IR with TICI3 reperfusion - likely embolic   Code Stroke CT head No acute abnormality. Old infarct L frontal lobe.   CTA head & neck L P2 occlusion. Mild neck atherosclerosis . No LVO anterior circulation. Mild mid BA narrowing.  CT perfusion ischemia w/o visible core  CT Head - 04/16/19 - Areas of acute infarction are showing low-density and swelling but no macroscopic hemorrhage by CT.    Cerebral angio TICI3 reperfusion of occluded L PCA  MRI  L PCA (including thalamus), L>R cerebellum, R pontine and left midbrain infarct. Petechial hemorrhage seen in pons. Old L frontal infarcts.   LE Doppler no DVT - no evidence of deep vein thrombosis   2D Echo - EF 55-60%.  No cardiac source of emboli identified.   TCD bubble no HITS  TEE neg  UDS - positive for benzos - UDS done on 9/1  EEG - cortical dysfunction but no seizures.  LDL 88  HgbA1c 12.3  Lovenox 40 mg sq daily  for VTE prophylaxis  No antithrombotic prior to admission, finished DAPT 3-week course,  now on aspirin alone   Therapy recommendations:  CIR -> D/t lack of post hospitalization family support - will pursue SNF - will need rapid covid testing prior to D/C  Disposition:  pending (SW trying to help family w/ placement, MA MCD and transfer back to [place])  Medically ready for d/c when bed found  Patient remains stable  Possible seizure  Full body rigid shaking hours post IR, L>R  Loaded with keppra  EEG - cortical dysfunction but no seizures.  Continue keppra 500mg  bid  Hypertension  No hx of HTN, on no home meds  Placed On cleviprex gtt for BP control post IR, now off . Discontinue amlodipine 10 . Now on lotensin  to 10mg  bid  . BP stable . BP goal normotensive  Hyperlipidemia  Home meds:  No statin  LDL 88, goal < 70  HDL 39  On lipitor 20  Continue statin at discharge  Diabetes type II, new diagnosis, Uncontrolled Hyperglycemia, improving  HgbA1c 12.3, goal < 7.0  CBGs  continue metformin 1000mg  bid   off glipizde to 5 mg bid d/t hypoglycemia  glipizide 2.5mg  QA C -> 2.5mg  bid -> hypoglycemia 10/6 -> back to 2.5 mg qac ->glucose lowish 10/18 -> glipizide discontinued  DM coordinator following - should glucoses remain elevated. May benefit from SGLT-2inhibitior if he gets insurance   SSI 0-15 scale, 0-5 qhs  Dysphagia Malnutrition  . Secondary to stroke . Speech on board . Encourage po intake . Off IVF, lab stable . On glucerna shakes tid . Now cleared for D3 thin liquids in cup only, meds whole w/ liquids in cup  UTI with fever, resolved  UA showed WBC > 50  Urine culture enterobactor and staph coag neg  Sensitive to cipro  Finished cipro 500mg  bid course (ends 9/8)  Fever resolved   Other Stroke Risk Factors   hx of stroke - small remote appearing infarcts in the left frontal lobe by CT and MRI  Other Active Problems  Microcytic anemia - iron 38, Ferritin 115 - put on iron tab bid.  Pt found on floor 04/23/19 by nursing staff. No obvious injury, complaints of pain, or neurologic changes.   Pt had 2-3 times slip off chair while sitting in chair. No fall, no injury. - now move to room 13 closer to nurse station.    Pt had decreased UOP 10/8, put on IVF @ 100 for 24 hours.    Hospital day # 34   Rosalin Hawking, MD PhD Stroke Neurology 06/06/2019 11:38 AM    To contact Stroke Continuity provider, please refer to [email or web address] After hours, contact General Neurology

## 2019-06-07 LAB — GLUCOSE, CAPILLARY
Glucose-Capillary: 118 mg/dL — ABNORMAL HIGH (ref 70–99)
Glucose-Capillary: 139 mg/dL — ABNORMAL HIGH (ref 70–99)
Glucose-Capillary: 151 mg/dL — ABNORMAL HIGH (ref 70–99)
Glucose-Capillary: 130 mg/dL — ABNORMAL HIGH (ref 70–99)

## 2019-06-07 MED ORDER — LEVETIRACETAM 100 MG/ML PO SOLN
500.0000 mg | Freq: Every day | ORAL | Status: AC
  Administered 2019-06-08 – 2019-06-10 (×3): 500 mg via ORAL
  Filled 2019-06-07 (×3): qty 5

## 2019-06-07 NOTE — Progress Notes (Signed)
STROKE TEAM PROGRESS NOTE   INTERVAL HISTORY Pt sleeping in bed, but easily arousable. No acute event overnight. Glucose stable. Pending SNF.   Vitals:   06/07/19 0032 06/07/19 0439 06/07/19 0915 06/07/19 1137  BP: 109/76 105/65 120/72 110/78  Pulse: 88 76 86 84  Resp: 18 18 17 17   Temp: 97.7 F (36.5 C) 98.2 F (36.8 C) 97.6 F (36.4 C) 98.2 F (36.8 C)  TempSrc: Oral Oral Oral Oral  SpO2: 99% 100% 100% 100%  Weight:      Height:        CBC:  Recent Labs  Lab 06/01/19 0416 06/04/19 0530  WBC 6.2 5.7  HGB 11.8* 11.9*  HCT 36.6* 38.6*  MCV 71.8* 73.5*  PLT 283 423    Basic Metabolic Panel:  Recent Labs  Lab 06/01/19 0416 06/04/19 0530  NA 137 139  K 4.1 3.9  CL 103 102  CO2 24 27  GLUCOSE 123* 114*  BUN 8 9  CREATININE 0.74 0.81  CALCIUM 9.6 9.8    IMAGING last 24h No results found.   PHYSICAL EXAM    Temp:  [97.6 F (36.4 C)-98.2 F (36.8 C)] 98.2 F (36.8 C) (10/20 1137) Pulse Rate:  [76-88] 84 (10/20 1137) Resp:  [17-18] 17 (10/20 1137) BP: (105-122)/(65-82) 110/78 (10/20 1137) SpO2:  [99 %-100 %] 100 % (10/20 1137)  General - Well nourished, well developed, in no apparent distress.  Ophthalmologic - fundi not visualized due to noncooperation.  Cardiovascular - Regular rhythm and rate.  Neuro - awake, alert, eyes open, following simple commands. Able to have some words articulated, hypophonia much improved, still no sentence yet.  Incomplete left gaze bilaterally, right gaze complete, up and downward gaze palsy. PERRL. Not blinking to visual threat on the right. Rt hemianopia. Right facial droop, tongue midline, LUE 5/5 but significant ataxia. LLE 4/5 at least. On pain stimulation, slight withdraw of RLE but no movement of RUE. Diminished tone on the right. Positive triple reflex on the right. Sensation decreased on the left, and gait not tested.   ASSESSMENT/PLAN [name] with no significant past medical history  presenting with right-sided weakness, decreased sensation on the right, diplopia and headache. Complete revascularization of occluded L PCA w/ mechanical thrombectomy.  Stroke: embolic multifocal posterior circulation infarcts with left P2 occlusion s/p IR with TICI3 reperfusion - likely embolic   Code Stroke CT head No acute abnormality. Old infarct L frontal lobe.   CTA head & neck L P2 occlusion. Mild neck atherosclerosis . No LVO anterior circulation. Mild mid BA narrowing.  CT perfusion ischemia w/o visible core  CT Head - 04/16/19 - Areas of acute infarction are showing low-density and swelling but no macroscopic hemorrhage by CT.    Cerebral angio TICI3 reperfusion of occluded L PCA  MRI  L PCA (including thalamus), L>R cerebellum, R pontine and left midbrain infarct. Petechial hemorrhage seen in pons. Old L frontal infarcts.   LE Doppler no DVT - no evidence of deep vein thrombosis   2D Echo - EF 55-60%.  No cardiac source of emboli identified.   TCD bubble no HITS  TEE neg  UDS - positive for benzos - UDS done on 9/1  EEG - cortical dysfunction but no seizures.  LDL 88  HgbA1c 12.3  Lovenox 40 mg sq daily  for VTE prophylaxis  No antithrombotic prior to admission, finished DAPT 3-week course, now on aspirin alone   Therapy recommendations:  CIR -> D/t lack of post hospitalization family support - will pursue SNF - will need rapid covid testing prior to D/C  Disposition:  pending (SW trying to help family w/ placement, MA MCD and transfer back to [place])  Medically ready for d/c when bed found  Patient remains stable  Possible seizure  Full body rigid shaking hours post IR, L>R  Loaded with keppra  EEG - cortical dysfunction but no seizures.  On keppra nearly 2 months now, given no more seizure activity, will tape off.   Close monitoring  Seizure precautions  Hypertension  No hx of HTN, on no home meds  Placed On cleviprex gtt for BP control post  IR, now off . Discontinue amlodipine 10 . Now on lotensin to 10mg  bid  . BP stable . BP goal normotensive  Hyperlipidemia  Home meds:  No statin  LDL 88, goal < 70  HDL 39  On lipitor 20  Continue statin at discharge  Diabetes type II, new diagnosis, Uncontrolled Hyperglycemia, improving  HgbA1c 12.3, goal < 7.0  continue metformin 1000mg  bid   off glipizde to 5 mg bid d/t hypoglycemia  glipizide 2.5mg  QA C -> 2.5mg  bid -> hypoglycemia 10/6 -> back to 2.5 mg qac ->glucose lowish 10/18 -> glipizide discontinued  DM coordinator following - should glucoses remain elevated. May benefit from SGLT-2inhibitior if he gets insurance   SSI 0-15 scale, 0-5 qhs  CBG stable so far  Dysphagia Malnutrition  . Secondary to stroke . Speech on board . Encourage po intake . Off IVF, lab stable . On glucerna shakes tid . Now cleared for D3 thin liquids in cup only, meds whole w/ liquids in cup  UTI with fever, resolved  UA showed WBC > 50  Urine culture enterobactor and staph coag neg  Sensitive to cipro  Finished cipro 500mg  bid course (ends 9/8)  Fever resolved   Other Stroke Risk Factors   hx of stroke - small remote appearing infarcts in the left frontal lobe by CT and MRI  Other Active Problems  Microcytic anemia - iron 38, Ferritin 115 - put on iron tab bid.  Pt found on floor 04/23/19 by nursing staff. No obvious injury, complaints of pain, or neurologic changes.   Pt had 2-3 times slip off chair while sitting in chair. No fall, no injury. - now move to room 13 closer to nurse station.    Pt had decreased UOP 10/8, put on IVF @ 100 for 24 hours.    Hospital day # 33   06/23/19, MD PhD Stroke Neurology 06/07/2019 2:47 PM    To contact Stroke Continuity provider, please refer to 57. After hours, contact General Neurology

## 2019-06-07 NOTE — Progress Notes (Signed)
PT Cancellation Note  Patient Details Name: Rodney Collier MRN: [id number] DOB: [birth year]   Cancelled Treatment:    Reason Eval/Treat Not Completed: Patient declined, no reason specified.  Pt refusing OOB to chair, EOB, to WC (to go outside) or attempting gait today.  Despite significant expressive difficulties, he was very clear "no" for any mobility.  He expressed with mixed language that he has "given up" and doesn't want to be "like this".  He was agreeable for PT to check back another day.       Thanks,  Barbarann Ehlers. Emrys Mceachron, PT, DPT  Acute Rehabilitation [phone] pager [phone] office  @ [facility]: [phone]     [name] 06/07/2019, 4:14 PM

## 2019-06-08 LAB — GLUCOSE, CAPILLARY
Glucose-Capillary: 100 mg/dL — ABNORMAL HIGH (ref 70–99)
Glucose-Capillary: 140 mg/dL — ABNORMAL HIGH (ref 70–99)
Glucose-Capillary: 170 mg/dL — ABNORMAL HIGH (ref 70–99)
Glucose-Capillary: 109 mg/dL — ABNORMAL HIGH (ref 70–99)
Glucose-Capillary: 145 mg/dL — ABNORMAL HIGH (ref 70–99)

## 2019-06-08 LAB — CBC
HCT: 36.8 % — ABNORMAL LOW (ref 39.0–52.0)
Hemoglobin: 11.7 g/dL — ABNORMAL LOW (ref 13.0–17.0)
MCH: 22.9 pg — ABNORMAL LOW (ref 26.0–34.0)
MCHC: 31.8 g/dL (ref 30.0–36.0)
MCV: 72.2 fL — ABNORMAL LOW (ref 80.0–100.0)
Platelets: 339 10*3/uL (ref 150–400)
RBC: 5.1 MIL/uL (ref 4.22–5.81)
RDW: 14.4 % (ref 11.5–15.5)
WBC: 5.9 10*3/uL (ref 4.0–10.5)
nRBC: 0 % (ref 0.0–0.2)

## 2019-06-08 LAB — COMPREHENSIVE METABOLIC PANEL
ALT: 25 U/L (ref 0–44)
AST: 20 U/L (ref 15–41)
Albumin: 3.1 g/dL — ABNORMAL LOW (ref 3.5–5.0)
Alkaline Phosphatase: 58 U/L (ref 38–126)
Anion gap: 10 (ref 5–15)
BUN: 9 mg/dL (ref 6–20)
CO2: 26 mmol/L (ref 22–32)
Calcium: 9.7 mg/dL (ref 8.9–10.3)
Chloride: 101 mmol/L (ref 98–111)
Creatinine, Ser: 0.81 mg/dL (ref 0.61–1.24)
GFR calc Af Amer: >60 mL/min (ref >60)
GFR calc non Af Amer: >60 mL/min (ref >60)
Glucose, Bld: 107 mg/dL — ABNORMAL HIGH (ref 70–99)
Potassium: 4.1 mmol/L (ref 3.5–5.1)
Sodium: 137 mmol/L (ref 135–145)
Total Bilirubin: 0.5 mg/dL (ref 0.3–1.2)
Total Protein: 6.4 g/dL — ABNORMAL LOW (ref 6.5–8.1)

## 2019-06-08 NOTE — Progress Notes (Signed)
Physical Therapy Treatment Patient Details Name: Rodney Collier MRN: [id number] DOB: [birth year] Today's Date: 06/08/2019    History of Present Illness Pt is a 53 y.o. M with no known PMH who presents with right sided weakness, decreased sensation on right, diplopia and headache. CT showing remote L frontal infarcts, CTA head/neck with L P2 occlusion, MRI with acute L PCA infarct including much of L thalamus, L > R cerebellum, R pons. Now s/p complete revascularization of occluded L PCA with mechanical thrombectomy. ETT 8/27-8/28; self extubated 8/28.    PT Comments    On-site [place] interpreter, Taous, used today to see if having a [place] interpreter would help increase communication between therapist and pt (Rodney Collier) given that many of the words that come out when he tries to express himself are [place] and some are [place].  Rodney Collier seemed to respond positively to having the interpreter there and she reported that some of the [place] words he is saying are coming out as "half" words.  I do believe it helped.  I got more detailed information about his daughters, he reported R leg pain that was limiting his motivation to move today (although he did not want any medication to help with the leg pain, heat or ice).  He was re-evaluated at a bed level and goals were updated.  He is agreeable to get up to [facility] and go outside tomorrow with this therapist and the same [place].  She is scheduled to be back tomorrow at 11:30 am.     Follow Up Recommendations  SNF     Equipment Recommendations  Wheelchair (measurements PT);Wheelchair cushion (measurements PT);3in1 (PT);Other (comment)    Recommendations for Other Services   NA     Precautions / Restrictions Precautions Precautions: Fall Precaution Comments: R hemiparesis and inattention. Required Braces or Orthoses: Knee Immobilizer - Right(for attempts at gait)       Balance                                             Cognition Arousal/Alertness: Awake/alert Behavior During Therapy: Flat affect(crying) Overall Cognitive Status: Impaired/Different from baseline Area of Impairment: Safety/judgement;Awareness;Problem solving;Following commands;Attention                   Current Attention Level: Selective   Following Commands: Follows one step commands consistently(on L side) Safety/Judgement: Decreased awareness of deficits Awareness: Emergent Problem Solving: Difficulty sequencing           General Comments General comments (skin integrity, edema, etc.): We spent a significant amount of time with the [place] Interpreter attempting to get to the root of his refusal to mobilize.  Considering that he does speak some [place] (his native language) mixed with English I thought it would be appropriate to attempt communication with him with the interpreter present.  The on site interpreter, Taous, reports that he is saying half words in [place], some of what he says in [place] is unitelligible, and some is understandable.  She often had to re-word what she was asking or re-ask a more basic yes/no question, but it seemed to be successful.  I learned that he has 3 daughters, all live in [place], he has been in [place] for 21 days before admission and he was looking for "physical machine work".  He reported today that he was having pain in his left leg from his hip  to his toes, painful to move and painful to touch.  He had generalized tenderness to palpation and to PROM at each joint.  He could not describe they type of pain.  I asked if he wanted the RN to see if he could have something for pain and he said "no".  I offered up ice or heat and he said "no".  There was no obvious swelling, bruising, or crepitus upon inspection of his R leg and he only has trace hip movement (0/5knee, 0/5 ankle).  We talked about working with PT and why he did not want to get up (has been refusing more lately and he reported via the  interpreter that "I can't" and inidcated he was weak on his right side.  I told him we would help him move.  He continued to not want to get up today siting that his left leg pain was limiting his motivation to move today.  He wa agreeable to get up tomorrow into the [facility] and go outside with the same interpreter and this therapist.        Pertinent Vitals/Pain Pain Assessment: Faces Faces Pain Scale: Hurts even more Pain Location: right leg (entire leg) Pain Descriptors / Indicators: Grimacing;Crying Pain Intervention(s): Limited activity within patient's tolerance;Monitored during session;Repositioned;Other (comment)(pt declined wanting RN to bring pain meds)           PT Goals (current goals can now be found in the care plan section) Acute Rehab PT Goals PT Goal Formulation: With patient Time For Goal Achievement: 06/22/19 Potential to Achieve Goals: Good Progress towards PT goals: Progressing toward goals    Frequency    Min 3X/week      PT Plan Current plan remains appropriate       AM-PAC PT "6 Clicks" Mobility   Outcome Measure  Help needed turning from your back to your side while in a flat bed without using bedrails?: A Little Help needed moving from lying on your back to sitting on the side of a flat bed without using bedrails?: A Little Help needed moving to and from a bed to a chair (including a wheelchair)?: A Lot Help needed standing up from a chair using your arms (e.g., wheelchair or bedside chair)?: A Lot Help needed to walk in hospital room?: Total Help needed climbing 3-5 steps with a railing? : Total 6 Click Score: 12    End of Session   Activity Tolerance: Patient limited by pain Patient left: in bed;with call bell/phone within reach Nurse Communication: Other (comment)(asked if she needed to relay anything via the interpreter) PT Visit Diagnosis: Unsteadiness on feet (R26.81);Muscle weakness (generalized) (M62.81);Difficulty in walking, not elsewhere  classified (R26.2);Hemiplegia and hemiparesis Hemiplegia - Right/Left: Right Hemiplegia - dominant/non-dominant: Non-dominant Hemiplegia - caused by: Cerebral infarction     Time: 1455-1535 PT Time Calculation (min) (ACUTE ONLY): 40 min  Charges:    1- re eval Joscelynn Brutus B. Ericka Marcellus, PT, DPT  Acute Rehabilitation [phone] pager [phone] office  @ [facility]: [phone]                      06/08/2019, 4:45 PM

## 2019-06-08 NOTE — Progress Notes (Signed)
STROKE TEAM PROGRESS NOTE   INTERVAL HISTORY Pt lying in bed, no acute event overnight. Still has right hemiplegia. Labs this am unremarkable. Glucose improving  Vitals:   06/07/19 2031 06/07/19 2304 06/08/19 0408 06/08/19 0828  BP: 130/86 132/89 133/86 113/86  Pulse: 86 85 89 85  Resp: 19 16 16 16   Temp: 97.7 F (36.5 C) 98.3 F (36.8 C) 98 F (36.7 C) 98.8 F (37.1 C)  TempSrc: Oral  Oral Oral  SpO2: 100% 99% 99% 100%  Weight:      Height:        CBC:  Recent Labs  Lab 06/04/19 0530 06/08/19 0604  WBC 5.7 5.9  HGB 11.9* 11.7*  HCT 38.6* 36.8*  MCV 73.5* 72.2*  PLT 325 339    Basic Metabolic Panel:  Recent Labs  Lab 06/04/19 0530 06/08/19 0604  NA 139 137  K 3.9 4.1  CL 102 101  CO2 27 26  GLUCOSE 114* 107*  BUN 9 9  CREATININE 0.81 0.81  CALCIUM 9.8 9.7    IMAGING last 24h No results found.   PHYSICAL EXAM  Temp:  [97.7 F (36.5 C)-98.8 F (37.1 C)] 98.8 F (37.1 C) (10/21 0828) Pulse Rate:  [84-91] 85 (10/21 0828) Resp:  [16-19] 16 (10/21 0828) BP: (110-133)/(78-89) 113/86 (10/21 0828) SpO2:  [99 %-100 %] 100 % (10/21 0828)  General - Well nourished, well developed, in no apparent distress.  Ophthalmologic - fundi not visualized due to noncooperation.  Cardiovascular - Regular rhythm and rate.  Neuro - awake, alert, eyes open, following simple commands. Able to have some words articulated, hypophonia much improved, still no sentence yet.  Incomplete left gaze bilaterally, right gaze complete, up and downward gaze palsy. PERRL. Not blinking to visual threat on the right. Rt hemianopia. Right facial droop, tongue midline, LUE 5/5 but significant ataxia. LLE 4/5 at least. On pain stimulation, slight withdraw of RLE but no movement of RUE. Diminished tone on the right. Positive triple reflex on the right. Sensation decreased on the left, and gait not tested.   ASSESSMENT/PLAN Mr. Elam Ellis is a 53 y.o. male with no significant past medical  history presenting with right-sided weakness, decreased sensation on the right, diplopia and headache. Complete revascularization of occluded L PCA w/ mechanical thrombectomy.  Stroke: embolic multifocal posterior circulation infarcts with left P2 occlusion s/p IR with TICI3 reperfusion - likely embolic   Code Stroke CT head No acute abnormality. Old infarct L frontal lobe.   CTA head & neck L P2 occlusion. Mild neck atherosclerosis . No LVO anterior circulation. Mild mid BA narrowing.  CT perfusion ischemia w/o visible core  CT Head - 04/16/19 - Areas of acute infarction are showing low-density and swelling but no macroscopic hemorrhage by CT.    Cerebral angio TICI3 reperfusion of occluded L PCA  MRI  L PCA (including thalamus), L>R cerebellum, R pontine and left midbrain infarct. Petechial hemorrhage seen in pons. Old L frontal infarcts.   LE Doppler no DVT - no evidence of deep vein thrombosis   2D Echo - EF 55-60%.  No cardiac source of emboli identified.   TCD bubble no HITS  TEE neg  UDS - positive for benzos - UDS done on 9/1  EEG - cortical dysfunction but no seizures.  LDL 88  HgbA1c 12.3  Lovenox 40 mg sq daily  for VTE prophylaxis  No antithrombotic prior to admission, finished DAPT 3-week course, now on aspirin alone   Therapy recommendations:  CIR -> D/t lack of post hospitalization family support - will pursue SNF - will need rapid covid testing prior to D/C  Disposition:  pending (SW trying to help family w/ placement, MA MCD and transfer back to [place])  Medically ready for d/c when bed found  Patient remains stable  Possible seizure  Full body rigid shaking hours post IR, L>R  Loaded with keppra  EEG - cortical dysfunction but no seizures.  Tapering off keppra  Seizure precautions  Hypertension  No hx of HTN, on no home meds  Placed On cleviprex gtt for BP control post IR, now off . Discontinue amlodipine 10 . Now on lotensin to 10mg  bid   . BP stable . BP goal normotensive  Hyperlipidemia  Home meds:  No statin  LDL 88, goal < 70  HDL 39  On lipitor 20  Continue statin at discharge  Diabetes type II, new diagnosis, Uncontrolled Hyperglycemia, improving  HgbA1c 12.3, goal < 7.0  continue metformin 1000mg  bid   off glipizde to 5 mg bid d/t hypoglycemia  glipizide 2.5mg  QA C -> 2.5mg  bid -> hypoglycemia 10/6 -> back to 2.5 mg qac ->glucose lowish 10/18 -> glipizide discontinued  DM coordinator following - should glucoses remain elevated. May benefit from SGLT-2inhibitior if he gets insurance   SSI 0-15 scale, 0-5 qhs  CBG stable so far  Dysphagia Malnutrition  . Secondary to stroke . Speech on board . Encourage po intake . Off IVF, lab stable . On glucerna shakes tid . Now cleared for D3 thin liquids in cup only, meds whole w/ liquids in cup  UTI with fever, resolved  UA showed WBC > 50  Urine culture enterobactor and staph coag neg  Sensitive to cipro  Finished cipro 500mg  bid course (ends 9/8)  Fever resolved   Other Stroke Risk Factors   hx of stroke - small remote appearing infarcts in the left frontal lobe by CT and MRI  Other Active Problems  Microcytic anemia - iron 38, Ferritin 115 - put on iron tab bid.  Pt found on floor 04/23/19 by nursing staff. No obvious injury, complaints of pain, or neurologic changes.   Pt had 2-3 times slip off chair while sitting in chair. No fall, no injury. - now move to room 13 closer to nurse station.    Pt had decreased UOP 10/8, put on IVF @ 100 for 24 hours.   Hospital day # 61   Rosalin Hawking, MD PhD Stroke Neurology 06/08/2019 11:25 AM    To contact Stroke Continuity provider, please refer to [email or web address] After hours, contact General Neurology

## 2019-06-08 NOTE — Progress Notes (Signed)
  Speech Language Pathology Treatment: Cognitive-Linquistic;Dysphagia  Patient Details Name: Rodney Collier MRN: [id number] DOB: [birth year] Today's Date: 06/08/2019 Time: 1200-1240 SLP Time Calculation (min) (ACUTE ONLY): 40 min  Assessment / Plan / Recommendation Clinical Impression  Pt was seen at bedside for skilled ST intervention targeting goals for swallowing, speech, and language. Pt was seen during lunch of Dys3 solids and thin liquids via cup (due to silent aspiration of thin liquid via straw per MBS). Pt has a good appetite, and is able to self feed after set up. He requires cues to decrease bolus size and rate due to impulsivity. Cues also given to check right oral cavity for pocketing. No overt s/s aspiration observed during the meal. Safe swallow precautions posted at [facility]. Pt is able to follow simple directions with 90% accuracy. He is able to answer simple yes/no questions as well. Pt is perseverative with verbal responses when naming items on lunch tray. He frequently responds in [place]. Vocal volume continues to be very low with increased speech rate and minimal oral movement, all of which adversely affect intelligibility.  RN updated. Continued ST intervention is recommended.    HPI HPI: Pt is a 53 y.o. M with no known PMH who presents with right sided weakness, decreased sensation on right, diplopia and headache. CT showing remote L frontal infarcts, CTA head/neck with L P2 occlusion, MRI with acute L PCA infarct including much of L thalamus, L > R cerebellum, R pons. Now s/p complete revascularization of occluded L PCA with mechanical thrombectomy. ETT 8/27-8/28; self extubated 8/28.      SLP Plan  Continue with current plan of care       Recommendations  Diet recommendations: Dysphagia 3 (mechanical soft);Thin liquid Liquids provided via: Cup;No straw Medication Administration: Whole meds with liquid Supervision: Patient able to self feed;Full supervision/cueing for  compensatory strategies Compensations: Minimize environmental distractions;Small sips/bites;Slow rate;Lingual sweep for clearance of pocketing Postural Changes and/or Swallow Maneuvers: Seated upright 90 degrees;Upright 30-60 min after meal                Oral Care Recommendations: Oral care QID Follow up Recommendations: Skilled Nursing facility;24 hour supervision/assistance SLP Visit Diagnosis: Dysphagia, oropharyngeal phase (R13.12);Aphasia (R47.01) Plan: Continue with current plan of care       GO              Cree Kunert B. Quentin Ore, [facility], [place] Speech Language Pathologist Office: [phone] Pager: [phone]  [name] 06/08/2019, 12:42 PM

## 2019-06-08 NOTE — TOC Progression Note (Signed)
Transition of Care [facility]) - Progression Note    Patient Details  Name: Rodney Collier MRN: [id number] Date of Birth: 1965-11-20  Transition of Care [facility]) CM/SW [place], [place] Phone Number: 06/08/2019, 4:33 PM  Clinical Narrative:   CSW contacted patient's brother Linna Hoff, with daughter's permission, to see if the brother is able to assist with finding placement for the patient. CSW left him a voicemail. CSW to follow.    Expected Discharge Plan: [place] Barriers to Discharge: SNF Pending bed offer, Homeless with medical needs, Other (comment)(needs placement in [place])  Expected Discharge Plan and Services Expected Discharge Plan: [place] Choice: [place]                                         Social Determinants of Health (SDOH) Interventions    Readmission Risk Interventions No flowsheet data found.

## 2019-06-09 LAB — GLUCOSE, CAPILLARY
Glucose-Capillary: 112 mg/dL — ABNORMAL HIGH (ref 70–99)
Glucose-Capillary: 119 mg/dL — ABNORMAL HIGH (ref 70–99)
Glucose-Capillary: 288 mg/dL — ABNORMAL HIGH (ref 70–99)
Glucose-Capillary: 146 mg/dL — ABNORMAL HIGH (ref 70–99)
Glucose-Capillary: 166 mg/dL — ABNORMAL HIGH (ref 70–99)

## 2019-06-09 NOTE — TOC Progression Note (Signed)
Transition of Care [facility]) - Progression Note    Patient Details  Name: Rodney Collier MRN: [id number] Date of Birth: 04-03-1966  Transition of Care [facility]) CM/SW [place], [place] Phone Number: 06/09/2019, 4:22 PM  Clinical Narrative:  CSW following for discharge plans. CSW made phone calls to the following facilities to attempt to locate placement for the patient: [place]: left a voicemail for [facility]: spoke with Apolonio Schneiders, faxed over referral for patient [facility]: Left a voicemail for [employer] at [employer]: Left a Advertising account executive for [place]: Spoke with Jarrett Soho, faxed over referral for patient [place]: Spoke with Threasa Beards; they are unable to offer patient a bed, and provided some insight into why CSW may not be getting calls back on placement for him. Per Threasa Beards, there is a new order that has been put in place for [place] SNFs that they have to downsize their patient population due to [place] restrictions, so they are having to move long term care residents into other facilities and a lot of the SNFs are at full capacity right now. Per Internet search, there is also a travel ban on individuals enter [place] from [place], which could also be impeding patient's acceptance into SNF at this time. [facility]: Left a voicemail for Lujean Rave at [place]: Left a voicemail for [facility]: Left a voicemail for [facility]. Per receptionist who answered the phone, they are at capacity and have no beds available.  CSW to continue to follow.     Expected Discharge Plan: [place] Barriers to Discharge: SNF Pending bed offer, Homeless with medical needs, Other (comment)(needs placement in [place])  Expected Discharge Plan and Services Expected Discharge Plan: [place] Choice:  [place]                                         Social Determinants of Health (SDOH) Interventions    Readmission Risk Interventions No flowsheet data found.

## 2019-06-09 NOTE — Progress Notes (Signed)
STROKE TEAM PROGRESS NOTE   INTERVAL HISTORY Lying in bed, no neuro changes. Still has right hemiplegia.    Vitals:   06/08/19 1924 06/08/19 2318 06/09/19 0347 06/09/19 0920  BP: 136/88 112/74 129/81 129/79  Pulse: 90 77 79 69  Resp: 17 18 17 16   Temp: (!) 97.5 F (36.4 C) 97.8 F (36.6 C) 98 F (36.7 C) 98.2 F (36.8 C)  TempSrc: Oral Oral Oral Oral  SpO2: 100% 99% 100% 96%  Weight:      Height:        CBC:  Recent Labs  Lab 06/04/19 0530 06/08/19 0604  WBC 5.7 5.9  HGB 11.9* 11.7*  HCT 38.6* 36.8*  MCV 73.5* 72.2*  PLT 325 063    Basic Metabolic Panel:  Recent Labs  Lab 06/04/19 0530 06/08/19 0604  NA 139 137  K 3.9 4.1  CL 102 101  CO2 27 26  GLUCOSE 114* 107*  BUN 9 9  CREATININE 0.81 0.81  CALCIUM 9.8 9.7    IMAGING last 24h No results found.   PHYSICAL EXAM  Temp:  [97.5 F (36.4 C)-98.2 F (36.8 C)] 98.2 F (36.8 C) (10/22 0920) Pulse Rate:  [69-90] 69 (10/22 0920) Resp:  [16-18] 16 (10/22 0920) BP: (112-136)/(74-88) 129/79 (10/22 0920) SpO2:  [96 %-100 %] 96 % (10/22 0920)  General - Well nourished, well developed, in no apparent distress.  Ophthalmologic - fundi not visualized due to noncooperation.  Cardiovascular - Regular rhythm and rate.  Neuro - awake, alert, eyes open, following simple commands. Able to have some words articulated, hypophonia much improved, still no sentence yet.  Incomplete left gaze bilaterally, right gaze complete, up and downward gaze palsy. PERRL. Not blinking to visual threat on the right. Rt hemianopia. Right facial droop, tongue midline, LUE 5/5 but significant ataxia. LLE 4/5 at least. On pain stimulation, slight withdraw of RLE but no movement of RUE. Diminished tone on the right. Positive triple reflex on the right. Sensation decreased on the left, and gait not tested.   ASSESSMENT/PLAN [name] with no significant past medical history presenting with right-sided weakness,  decreased sensation on the right, diplopia and headache. Complete revascularization of occluded L PCA w/ mechanical thrombectomy.  Stroke: embolic multifocal posterior circulation infarcts with left P2 occlusion s/p IR with TICI3 reperfusion - likely embolic   Code Stroke CT head No acute abnormality. Old infarct L frontal lobe.   CTA head & neck L P2 occlusion. Mild neck atherosclerosis . No LVO anterior circulation. Mild mid BA narrowing.  CT perfusion ischemia w/o visible core  CT Head - 04/16/19 - Areas of acute infarction are showing low-density and swelling but no macroscopic hemorrhage by CT.    Cerebral angio TICI3 reperfusion of occluded L PCA  MRI  L PCA (including thalamus), L>R cerebellum, R pontine and left midbrain infarct. Petechial hemorrhage seen in pons. Old L frontal infarcts.   LE Doppler no DVT - no evidence of deep vein thrombosis   2D Echo - EF 55-60%.  No cardiac source of emboli identified.   TCD bubble no HITS  TEE neg  UDS - positive for benzos - UDS done on 9/1  EEG - cortical dysfunction but no seizures.  LDL 88  HgbA1c 12.3  Lovenox 40 mg sq daily  for VTE prophylaxis  No antithrombotic prior to admission, finished DAPT 3-week course, now on aspirin alone   Therapy recommendations:  CIR -> D/t lack of  post hospitalization family support - will pursue SNF - will need rapid covid testing prior to D/C  Disposition:  pending (SW trying to help family w/ placement, [place] MCD and transfer back to [place])  Medically ready for d/c when bed found  Patient remains stable  Possible seizure  Full body rigid shaking hours post IR, L>R  Loaded with keppra  EEG - cortical dysfunction but no seizures.  Tapering off keppra (tomorrow last dose)  Seizure precautions  Hypertension  No hx of HTN, on no home meds  Placed On cleviprex gtt for BP control post IR, now off . Discontinue amlodipine 10 . Now on lotensin to 10mg  bid  . BP stable . BP goal  normotensive  Hyperlipidemia  Home meds:  No statin  LDL 88, goal < 70  HDL 39  On lipitor 20  Continue statin at discharge  Diabetes type II, new diagnosis, Uncontrolled Hyperglycemia, improving  HgbA1c 12.3, goal < 7.0  continue metformin 1000mg  bid   off glipizde to 5 mg bid d/t hypoglycemia  glipizide 2.5mg  QA C -> 2.5mg  bid -> hypoglycemia 10/6 -> back to 2.5 mg qac ->glucose lowish 10/18 -> glipizide discontinued  DM coordinator following - should glucoses remain elevated. May benefit from SGLT-2inhibitior if he gets insurance   SSI 0-15 scale, 0-5 qhs  CBG stable so far  Dysphagia Malnutrition  . Secondary to stroke . Speech on board . Encourage po intake . Off IVF, lab stable . On glucerna shakes tid . Now cleared for D3 thin liquids in cup only, meds whole w/ liquids in cup  UTI with fever, resolved  UA showed WBC > 50  Urine culture enterobactor and staph coag neg  Sensitive to cipro  Finished cipro 500mg  bid course (ends 9/8)  Fever resolved   Other Stroke Risk Factors   hx of stroke - small remote appearing infarcts in the left frontal lobe by CT and MRI  Other Active Problems  Microcytic anemia - iron 38, Ferritin 115 - put on iron tab bid.  Pt found on floor 04/23/19 by nursing staff. No obvious injury, complaints of pain, or neurologic changes.   Pt had 2-3 times slip off chair while sitting in chair. No fall, no injury. - now move to room 13 closer to nurse station.    Pt had decreased UOP 10/8, put on IVF @ 100 for 24 hours.   Hospital day # 8   06/23/19, MD PhD Stroke Neurology 06/09/2019 11:43 AM    To contact Stroke Continuity provider, please refer to 59. After hours, contact General Neurology

## 2019-06-09 NOTE — Progress Notes (Signed)
Physical Therapy Treatment Patient Details Name: Rodney Collier MRN: [id number] DOB: 1966/03/17 Today's Date: 06/09/2019    History of Present Illness Pt is a 53 y.o. M with no known PMH who presents with right sided weakness, decreased sensation on right, diplopia and headache. CT showing remote L frontal infarcts, CTA head/neck with L P2 occlusion, MRI with acute L PCA infarct including much of L thalamus, L > R cerebellum, R pons. Now s/p complete revascularization of occluded L PCA with mechanical thrombectomy. ETT 8/27-8/28; self extubated 8/28.    PT Comments    Pt was able to get up to the [facility] today and use WC to get to elevator and outside for some much needed mental break from his hospital room.  Music was incorporated into the session (he likes Donn Pierini), and [place] interpreter was used to help increase PT's understanding of [place] expressions in mixed [place] and English expressive aphasia.  Pt smiled today, which I think is a good thing as he has typically been very flat.  We will continue to utilize the [place] interpreter and encourage mobility.  Consistent therapist would be helpful.    Follow Up Recommendations  SNF     Equipment Recommendations  Wheelchair (measurements PT);Wheelchair cushion (measurements PT);3in1 (PT);Other (comment)(hemi height, one arm drive)    Recommendations for Other Services   NA     Precautions / Restrictions Precautions Precautions: Fall Precaution Comments: R hemiparesis and inattention. Required Braces or Orthoses: Knee Immobilizer - Right(for attempts at gait)    Mobility  Bed Mobility Overal bed mobility: Needs Assistance Bed Mobility: Supine to Sit     Supine to sit: Min assist Sit to supine: Min assist   General bed mobility comments: Min assist mostly to help progress right leg to EOB and lightly support trunk as pt comes up to sitting EOB.   Transfers Overall transfer level: Needs assistance Equipment used:  None Transfers: Sit to/from [employer] Sit to Stand: Mod assist Stand pivot transfers: Mod assist       General transfer comment: Light mod assist to stand and get into WC to his left and to stand from his WC back to bed on his left.  Pt needed cues to wait until PT was ready and WC was stabilized before coming up to stand.             Merchant navy officer mobility: Yes Wheelchair propulsion: Left upper extremity;Left lower extremity Wheelchair parts: Needs assistance Distance: 100 Wheelchair Assistance Details (indicate cue type and reason): Up to mod assist for [facility] steering as pt tends to list to the right, cues to use his Left foot as he often holds it up in the air during WC mobility instead of using it to steer, encouraged pushing the elevator button and keeping WC in the middle of the hallway using the colored square tiles that run down the middle of the hallway.   Modified Rankin (Stroke Patients Only) Modified Rankin (Stroke Patients Only) Pre-Morbid Rankin Score: No symptoms Modified Rankin: Moderately severe disability     Balance Overall balance assessment: Needs assistance Sitting-balance support: Feet supported;Single extremity supported Sitting balance-Leahy Scale: Fair Sitting balance - Comments: close supervision for static sitting EOB   Standing balance support: Single extremity supported Standing balance-Leahy Scale: Poor Standing balance comment: up to mod assist in standing for balance and support at his left knee.  Cognition Arousal/Alertness: Awake/alert Behavior During Therapy: Impulsive(smiled today while listening to MJ) Overall Cognitive Status: Impaired/Different from baseline Area of Impairment: Safety/judgement;Awareness;Problem solving;Following commands;Attention                 Orientation Level: Person(could not recall his daughter's name (or say  it?)) Current Attention Level: Alternating Memory: Decreased short-term memory Following Commands: Follows one step commands consistently Safety/Judgement: Decreased awareness of safety;Decreased awareness of deficits(remains quite impulsive) Awareness: Emergent Problem Solving: Difficulty sequencing General Comments: Interpreter used again today to help decipher language.   Pt in much brighter spirits, smiling at times.  Remains quick to move without checking for safety first (WC locked, therapist ready).       Exercises General Exercises - Upper Extremity Shoulder Flexion: PROM;10 reps;Seated Elbow Flexion: PROM;Right;Seated Elbow Extension: PROM;Right;Seated    General Comments General comments (skin integrity, edema, etc.): Again, utilized the [place], Taous to increase understanding of some of the [place] words that he is saying due to expressive aphasia with mixed [place] and [place] responses.  Utilized music this session both for mood, dancing from a WC level and attempting more speech through singing.  He likes Roe Rutherford.       Pertinent Vitals/Pain Pain Assessment: No/denies pain           PT Goals (current goals can now be found in the care plan section) Acute Rehab PT Goals Patient Stated Goal: to continue to have visitors like PT and interpreter, Taous, because we are "fun". Progress towards PT goals: Progressing toward goals    Frequency    Min 3X/week      PT Plan Current plan remains appropriate       AM-PAC PT "6 Clicks" Mobility   Outcome Measure  Help needed turning from your back to your side while in a flat bed without using bedrails?: A Little Help needed moving from lying on your back to sitting on the side of a flat bed without using bedrails?: A Little Help needed moving to and from a bed to a chair (including a wheelchair)?: A Lot Help needed standing up from a chair using your arms (e.g., wheelchair or bedside chair)?: A  Lot Help needed to walk in hospital room?: Total Help needed climbing 3-5 steps with a railing? : Total 6 Click Score: 12    End of Session   Activity Tolerance: Patient limited by fatigue Patient left: in bed;with call bell/phone within reach;with bed alarm set;with nursing/sitter in room(RN tech)   PT Visit Diagnosis: Unsteadiness on feet (R26.81);Muscle weakness (generalized) (M62.81);Difficulty in walking, not elsewhere classified (R26.2);Hemiplegia and hemiparesis Hemiplegia - Right/Left: Right Hemiplegia - dominant/non-dominant: Non-dominant Hemiplegia - caused by: Cerebral infarction     Time: 1206-1330(did not charge 1 unit for seated time outside/music) PT Time Calculation (min) (ACUTE ONLY): 84 min  Charges:  $Therapeutic Exercise: 8-22 mins $Therapeutic Activity: 23-37 mins $Wheel Chair Management: 23-37 mins              Ajit Errico B. Eduin Friedel, PT, DPT  Acute Rehabilitation [phone] pager [phone] office  @ [place] [place]: [phone]           06/09/2019, 3:35 PM

## 2019-06-10 LAB — GLUCOSE, CAPILLARY
Glucose-Capillary: 120 mg/dL — ABNORMAL HIGH (ref 70–99)
Glucose-Capillary: 244 mg/dL — ABNORMAL HIGH (ref 70–99)

## 2019-06-10 NOTE — Progress Notes (Signed)
Occupational Therapy Treatment Patient Details Name: Rodney Collier MRN: [id number] DOB: Jul 25, 1966 Today's Date: 06/10/2019    History of present illness Pt is a 53 y.o. M with no known PMH who presents with right sided weakness, decreased sensation on right, diplopia and headache. CT showing remote L frontal infarcts, CTA head/neck with L P2 occlusion, MRI with acute L PCA infarct including much of L thalamus, L > R cerebellum, R pons. Now s/p complete revascularization of occluded L PCA with mechanical thrombectomy. ETT 8/27-8/28; self extubated 8/28.   OT comments  Pt. Seen for skilled OT session.  Able to complete bed mobility and stand pivot transfer to recliner min/mod a.  Remains impulsive with pursuits with cues required to slow pace and follow proper sequencing for safety. Motivated with participation throughout session, eager for next task.     Follow Up Recommendations  SNF    Equipment Recommendations  None recommended by OT    Recommendations for Other Services      Precautions / Restrictions Precautions Precautions: Fall Precaution Comments: R hemiparesis and inattention. Required Braces or Orthoses: Knee Immobilizer - Right(PT notes indicate on if attempting ambulation) Restrictions Weight Bearing Restrictions: No       Mobility Bed Mobility Overal bed mobility: Needs Assistance Bed Mobility: Supine to Sit Rolling: Min assist         General bed mobility comments: Min assist mostly to help progress right leg to EOB and lightly support trunk as pt comes up to sitting EOB.   Transfers Overall transfer level: Needs assistance Equipment used: None Transfers: Sit to/from [employer] Sit to Stand: Min guard Stand pivot transfers: Min assist;Mod assist       General transfer comment: assistance to slow pace and urge proper sequencing for safety. pt. making a "shoo away" gesture and wanted to get to the chair without assistance or followig the  instructions provided    Balance                                           ADL either performed or assessed with clinical judgement   ADL Overall ADL's : Needs assistance/impaired Eating/Feeding: Sitting;Minimal assistance Eating/Feeding Details (indicate cue type and reason): minA for appropriate sized sips and to clear mouth Grooming: Wash/dry face;Sitting;Minimal assistance Grooming Details (indicate cue type and reason): declined oral care                 Toilet Transfer: Minimal assistance;Stand-pivot Toilet Transfer Details (indicate cue type and reason): simulated eob to recliner-assistance mostly required to control impulsive tendancies and advise in sequencing for safety (pt. trying to pivot before he had brought RLE oob         Functional mobility during ADLs: Minimal assistance;Cueing for sequencing;Cueing for safety       Vision       Perception     Praxis      Cognition Arousal/Alertness: Awake/alert Behavior During Therapy: Impulsive Overall Cognitive Status: Impaired/Different from baseline                                          Exercises Other Exercises Other Exercises: R UE PROM, with AROM to L UE seated all planes to increase/maintain functional use of B UEs.   Shoulder Instructions  General Comments      Pertinent Vitals/ Pain       Pain Assessment: Faces Faces Pain Scale: Hurts a little bit Pain Location: right leg (entire leg) Pain Descriptors / Indicators: Grimacing Pain Intervention(s): Limited activity within patient's tolerance;Monitored during session;Repositioned  Home Living                                          Prior Functioning/Environment              Frequency  Min 2X/week        Progress Toward Goals  OT Goals(current goals can now be found in the care plan section)  Progress towards OT goals: Progressing toward goals     Plan Discharge  plan remains appropriate    Co-evaluation                 AM-PAC OT "6 Clicks" Daily Activity     Outcome Measure   Help from another person eating meals?: A Little Help from another person taking care of personal grooming?: A Lot Help from another person toileting, which includes using toliet, bedpan, or urinal?: A Lot Help from another person bathing (including washing, rinsing, drying)?: A Lot Help from another person to put on and taking off regular upper body clothing?: A Lot Help from another person to put on and taking off regular lower body clothing?: A Lot 6 Click Score: 13    End of Session    OT Visit Diagnosis: Unsteadiness on feet (R26.81);Hemiplegia and hemiparesis Hemiplegia - Right/Left: Right Hemiplegia - dominant/non-dominant: Dominant Hemiplegia - caused by: Cerebral infarction   Activity Tolerance Patient tolerated treatment well   Patient Left in chair;with call bell/phone within reach;with chair alarm set(seatbelt chair alarm, RN assisted with instructions for use)   Nurse Communication          Time: 0370-4888 OT Time Calculation (min): 20 min  Charges: OT General Charges $OT Visit: 1 Visit OT Treatments $Self Care/Home Management : 8-22 mins   Janice Coffin, COTA/L 06/10/2019, 10:28 AM

## 2019-06-10 NOTE — TOC Progression Note (Signed)
Transition of Care [facility]) - Progression Note    Patient Details  Name: Rodney Collier MRN: [id number] Date of Birth: 09-Jul-1966  Transition of Care [facility]) CM/SW [place], [place] Phone Number: 06/10/2019, 4:15 PM  Clinical Narrative:   CSW received call back from [facility] with [facility], and they are unable to offer a bed for the patient at this time. CSW received call back from [place] with [facility], and she asked for clinicals. CSW faxed information, will await a call back. CSW received call back from [place] with [facility] that they do not have any beds, they are full.   CSW also spoke with patient's sister-in-law, [name], with updates on the patient's placement. [name] was upset that she was not being given updates, and CSW apologized but indicated that there were no real updates to give, that all of the facilities that have been contacted either do not call back or do not have any beds available. [name] indicated that the family can help with making calls and finding where there are beds available. CSW emailed [name] the [employer] list with facility options, and she will reach out with any updates that she has. CSW will continue to follow.    Expected Discharge Plan: [place] Barriers to Discharge: SNF Pending bed offer, Homeless with medical needs, Other (comment)(needs placement in [place])  Expected Discharge Plan and Services Expected Discharge Plan: [place] Choice: [place]                                         Social Determinants of Health (SDOH) Interventions    Readmission Risk Interventions No flowsheet data found.

## 2019-06-10 NOTE — Progress Notes (Signed)
PT Cancellation Note  Patient Details Name: Rodney Collier MRN: [id number] DOB: Mar 19, 1966   Cancelled Treatment:    Reason Eval/Treat Not Completed: Pain limiting ability to participate.  Despite checking in with pt ~20 mins earlier and he was agreeable to work with PT, when I came back with the [facility] he refused OOB mobility.  I believe he was saying his right leg was hurting.  He did work with OTA earlier this AM.   Thanks,  Barbarann Ehlers. Chade Pitner, PT, DPT  Acute Rehabilitation [phone] pager [phone] office  @ [facility]: [phone]     [name] 06/10/2019, 2:12 PM

## 2019-06-10 NOTE — Progress Notes (Addendum)
STROKE TEAM PROGRESS NOTE   INTERVAL HISTORY No event overnight. Still pending SNF. Right hemiplegia.     Vitals:   06/09/19 1950 06/09/19 2344 06/10/19 0427 06/10/19 0823  BP: 125/80 131/83 126/85 106/72  Pulse: 81 95 81 84  Resp: 18 17 17 17   Temp: 98 F (36.7 C)  97.7 F (36.5 C) 98.6 F (37 C)  TempSrc: Oral  Oral Oral  SpO2: 100% 100% 100% 100%  Weight:      Height:        CBC:  Recent Labs  Lab 06/04/19 0530 06/08/19 0604  WBC 5.7 5.9  HGB 11.9* 11.7*  HCT 38.6* 36.8*  MCV 73.5* 72.2*  PLT 325 339    Basic Metabolic Panel:  Recent Labs  Lab 06/04/19 0530 06/08/19 0604  NA 139 137  K 3.9 4.1  CL 102 101  CO2 27 26  GLUCOSE 114* 107*  BUN 9 9  CREATININE 0.81 0.81  CALCIUM 9.8 9.7    IMAGING last 24h No results found.   PHYSICAL EXAM  Temp:  [97.7 F (36.5 C)-100.2 F (37.9 C)] 98.6 F (37 C) (10/23 0823) Pulse Rate:  [81-97] 84 (10/23 0823) Resp:  [16-18] 17 (10/23 0823) BP: (106-131)/(72-85) 106/72 (10/23 0823) SpO2:  [98 %-100 %] 100 % (10/23 0823)  General - Well nourished, well developed, in no apparent distress.  Ophthalmologic - fundi not visualized due to noncooperation.  Cardiovascular - Regular rhythm and rate.  Neuro - awake, alert, eyes open, following simple commands. Able to have some words articulated, hypophonia much improved, still no sentence yet.  Incomplete left gaze bilaterally, right gaze complete, up and downward gaze palsy. PERRL. Not blinking to visual threat on the right. Rt hemianopia. Right facial droop, tongue midline, LUE 5/5 but significant ataxia. LLE 4/5 at least. On pain stimulation, slight withdraw of RLE but no movement of RUE. Diminished tone on the right. Positive triple reflex on the right. Sensation decreased on the left, and gait not tested.   ASSESSMENT/PLAN [name] with no significant past medical history presenting with right-sided weakness, decreased sensation on the  right, diplopia and headache. Complete revascularization of occluded L PCA w/ mechanical thrombectomy.  Stroke: embolic multifocal posterior circulation infarcts with left P2 occlusion s/p IR with TICI3 reperfusion - likely embolic   Code Stroke CT head No acute abnormality. Old infarct L frontal lobe.   CTA head & neck L P2 occlusion. Mild neck atherosclerosis . No LVO anterior circulation. Mild mid BA narrowing.  CT perfusion ischemia w/o visible core  CT Head - 04/16/19 - Areas of acute infarction are showing low-density and swelling but no macroscopic hemorrhage by CT.    Cerebral angio TICI3 reperfusion of occluded L PCA  MRI  L PCA (including thalamus), L>R cerebellum, R pontine and left midbrain infarct. Petechial hemorrhage seen in pons. Old L frontal infarcts.   LE Doppler no DVT - no evidence of deep vein thrombosis   2D Echo - EF 55-60%.  No cardiac source of emboli identified.   TCD bubble no HITS  TEE neg  UDS - positive for benzos - UDS done on 9/1  EEG - cortical dysfunction but no seizures.  LDL 88  HgbA1c 12.3  Lovenox 40 mg sq daily  for VTE prophylaxis  No antithrombotic prior to admission, finished DAPT 3-week course, now on aspirin alone   Therapy recommendations:  CIR -> D/t lack of post hospitalization family support -  will pursue SNF - will need rapid covid testing prior to D/C  Disposition:  pending (SW trying to help family w/ placement, MA MCD and transfer back to [place])  Medically ready for d/c when bed found  Patient remains stable  Possible seizure  Full body rigid shaking hours post IR, L>R  Loaded with keppra  EEG - cortical dysfunction but no seizures.  Tapered off keppra 10/23  Seizure precautions  Hypertension  No hx of HTN, on no home meds  Placed On cleviprex gtt for BP control post IR, now off . Discontinue amlodipine 10 . Now on lotensin to 10mg  bid  . BP stable . BP goal normotensive  Hyperlipidemia  Home  meds:  No statin  LDL 88, goal < 70  HDL 39  On lipitor 20  Continue statin at discharge  Diabetes type II, new diagnosis, Uncontrolled Hyperglycemia, improving  HgbA1c 12.3, goal < 7.0  continue metformin 1000mg  bid   off glipizde to 5 mg bid d/t hypoglycemia  glipizide 2.5mg  QA C -> 2.5mg  bid -> hypoglycemia 10/6 -> back to 2.5 mg qac ->glucose lowish 10/18 -> glipizide discontinued  DM coordinator following - should glucoses remain elevated. May benefit from SGLT-2inhibitior if he gets insurance   SSI 0-15 scale, 0-5 qhs  CBG stable so far  Dysphagia Malnutrition  . Secondary to stroke . Speech on board . Encourage po intake . Off IVF, lab stable . On glucerna shakes tid . Now cleared for D3 thin liquids in cup only, meds whole w/ liquids in cup  UTI with fever, resolved  UA showed WBC > 50  Urine culture enterobactor and staph coag neg  Sensitive to cipro  Finished cipro 500mg  bid course (ends 9/8)  Fever resolved   Other Stroke Risk Factors   hx of stroke - small remote appearing infarcts in the left frontal lobe by CT and MRI  Other Active Problems  Microcytic anemia - iron 38, Ferritin 115 - put on iron tab bid.  Pt found on floor 04/23/19 by nursing staff. No obvious injury, complaints of pain, or neurologic changes.   Pt had 2-3 times slip off chair while sitting in chair. No fall, no injury. - now move to room 13 closer to nurse station.    Pt had decreased UOP 10/8, put on IVF @ 100 for 24 hours.   Hospital day # 89   Rodney Hawking, MD PhD Stroke Neurology 06/10/2019 11:33 AM    To contact Stroke Continuity provider, please refer to [email or web address] After hours, contact General Neurology

## 2019-06-11 LAB — CBC
HCT: 38.1 % — ABNORMAL LOW (ref 39.0–52.0)
Hemoglobin: 11.9 g/dL — ABNORMAL LOW (ref 13.0–17.0)
MCH: 22.8 pg — ABNORMAL LOW (ref 26.0–34.0)
MCHC: 31.2 g/dL (ref 30.0–36.0)
MCV: 72.8 fL — ABNORMAL LOW (ref 80.0–100.0)
Platelets: 333 10*3/uL (ref 150–400)
RBC: 5.23 MIL/uL (ref 4.22–5.81)
RDW: 14.6 % (ref 11.5–15.5)
WBC: 6.9 10*3/uL (ref 4.0–10.5)
nRBC: 0 % (ref 0.0–0.2)

## 2019-06-11 LAB — GLUCOSE, CAPILLARY
Glucose-Capillary: 157 mg/dL — ABNORMAL HIGH (ref 70–99)
Glucose-Capillary: 77 mg/dL (ref 70–99)
Glucose-Capillary: 126 mg/dL — ABNORMAL HIGH (ref 70–99)

## 2019-06-11 LAB — COMPREHENSIVE METABOLIC PANEL
ALT: 20 U/L (ref 0–44)
AST: 16 U/L (ref 15–41)
Albumin: 3.2 g/dL — ABNORMAL LOW (ref 3.5–5.0)
Alkaline Phosphatase: 61 U/L (ref 38–126)
Anion gap: 12 (ref 5–15)
BUN: 10 mg/dL (ref 6–20)
CO2: 24 mmol/L (ref 22–32)
Calcium: 9.9 mg/dL (ref 8.9–10.3)
Chloride: 100 mmol/L (ref 98–111)
Creatinine, Ser: 0.77 mg/dL (ref 0.61–1.24)
GFR calc Af Amer: >60 mL/min (ref >60)
GFR calc non Af Amer: >60 mL/min (ref >60)
Glucose, Bld: 107 mg/dL — ABNORMAL HIGH (ref 70–99)
Potassium: 4.3 mmol/L (ref 3.5–5.1)
Sodium: 136 mmol/L (ref 135–145)
Total Bilirubin: 0.5 mg/dL (ref 0.3–1.2)
Total Protein: 6.6 g/dL (ref 6.5–8.1)

## 2019-06-11 NOTE — Progress Notes (Signed)
STROKE TEAM PROGRESS NOTE   INTERVAL HISTORY No major complaints.  MRI - acute left PCA territory infarct involving left thalamus, left midbrain cerebral peduncle, and left medial temporal and occipital lobes, and left cerebellum.  There appear to be some right cerebellum and right pons small infarcts too.    He is s/p mechanical thrombectomy at left PCA.  TTE and TEE are negative.  Tele and ECG showed NSR so far. A1c was 12.      Vitals:   06/10/19 2315 06/11/19 0320 06/11/19 0747 06/11/19 1129  BP: 117/79 120/80 (!) 89/58 121/73  Pulse: 87 81 80 100  Resp: 18 18 16 16   Temp: 97.9 F (36.6 C) 98.3 F (36.8 C) 97.8 F (36.6 C) 97.7 F (36.5 C)  TempSrc: Oral Oral Oral Oral  SpO2: 100% 100% 98% 100%  Weight:      Height:        CBC:  Recent Labs  Lab 06/08/19 0604 06/11/19 0508  WBC 5.9 6.9  HGB 11.7* 11.9*  HCT 36.8* 38.1*  MCV 72.2* 72.8*  PLT 339 269    Basic Metabolic Panel:  Recent Labs  Lab 06/08/19 0604 06/11/19 0508  NA 137 136  K 4.1 4.3  CL 101 100  CO2 26 24  GLUCOSE 107* 107*  BUN 9 10  CREATININE 0.81 0.77  CALCIUM 9.7 9.9    IMAGING last 24h No results found.   PHYSICAL EXAM  Temp:  [97.6 F (36.4 C)-99.2 F (37.3 C)] 97.7 F (36.5 C) (10/24 1129) Pulse Rate:  [80-100] 100 (10/24 1129) Resp:  [16-18] 16 (10/24 1129) BP: (89-133)/(58-85) 121/73 (10/24 1129) SpO2:  [98 %-100 %] 100 % (10/24 1129)  Awake, alert, speaks mostly french. RUE and RLE 0/5  LUE and LLE 5/5 Tongue deviates to the right.  EOMI, PERL Sensory reduced on the RUE and RLE.    ASSESSMENT/PLAN [name] with no significant past medical history presenting with right-sided weakness, decreased sensation on the right, diplopia and headache. Complete revascularization of occluded L PCA w/ mechanical thrombectomy.  Stroke: embolic multifocal posterior circulation infarcts with left P2 occlusion s/p IR with TICI3 reperfusion - likely embolic    Code Stroke CT head No acute abnormality. Old infarct L frontal lobe.   CTA head & neck L P2 occlusion. Mild neck atherosclerosis . No LVO anterior circulation. Mild mid BA narrowing.  CT perfusion ischemia w/o visible core  CT Head - 04/16/19 - Areas of acute infarction are showing low-density and swelling but no macroscopic hemorrhage by CT.    Cerebral angio TICI3 reperfusion of occluded L PCA  MRI  L PCA (including thalamus), L>R cerebellum, R pontine and left midbrain infarct. Petechial hemorrhage seen in pons. Old L frontal infarcts.   LE Doppler no DVT - no evidence of deep vein thrombosis   2D Echo - EF 55-60%.  No cardiac source of emboli identified.   TCD bubble no HITS  TEE neg  UDS - positive for benzos - UDS done on 9/1  EEG - cortical dysfunction but no seizures.  LDL 88  HgbA1c 12.3  Lovenox 40 mg sq daily  for VTE prophylaxis  No antithrombotic prior to admission, finished DAPT 3-week course, now on aspirin alone   Therapy recommendations:  CIR -> D/t lack of post hospitalization family support - will pursue SNF - will need rapid covid testing prior to D/C  Disposition:  pending (SW trying to help family w/  placement, MA MCD and transfer back to [facility])  Medically ready for d/c when bed found  Patient remains stable  Possible seizure  Full body rigid shaking hours post IR, L>R  Loaded with keppra  EEG - cortical dysfunction but no seizures.  Tapered off keppra 10/23  Seizure precautions  Hypertension  No hx of HTN, on no home meds  Placed On cleviprex gtt for BP control post IR, now off . Discontinue amlodipine 10 . Now on lotensin to 10mg  bid  . BP stable . BP goal normotensive  Hyperlipidemia  Home meds:  No statin  LDL 88, goal < 70  HDL 39  On lipitor 20  Continue statin at discharge  Diabetes type II, new diagnosis, Uncontrolled Hyperglycemia, improving  HgbA1c 12.3, goal < 7.0  continue metformin 1000mg  bid    off glipizde to 5 mg bid d/t hypoglycemia  glipizide 2.5mg  QA C -> 2.5mg  bid -> hypoglycemia 10/6 -> back to 2.5 mg qac ->glucose lowish 10/18 -> glipizide discontinued  DM coordinator following - should glucoses remain elevated. May benefit from SGLT-2inhibitior if he gets insurance   SSI 0-15 scale, 0-5 qhs  CBG stable so far  Dysphagia Malnutrition  . Secondary to stroke . Speech on board . Encourage po intake . Off IVF, lab stable . On glucerna shakes tid . Now cleared for D3 thin liquids in cup only, meds whole w/ liquids in cup  UTI with fever, resolved  UA showed WBC > 50  Urine culture enterobactor and staph coag neg  Sensitive to cipro  Finished cipro 500mg  bid course (ends 9/8)  Fever resolved   Other Stroke Risk Factors   hx of stroke - small remote appearing infarcts in the left frontal lobe by CT and MRI  Other Active Problems  Microcytic anemia - iron 38, Ferritin 115 - put on iron tab bid. Hb 11.7 ->11.9 (stable) (Bmet stable)  Pt found on floor 04/23/19 by nursing staff. No obvious injury, complaints of pain, or neurologic changes.   Pt had 2-3 times slip off chair while sitting in chair. No fall, no injury. - now move to room 13 closer to nurse station.    Pt had decreased UOP 10/8, put on IVF @ 100 for 24 hours.   Occasional low BP - 89/58  Hospital day # 58   The left thalamic lesions is giving him right sided numbness and the left cerebral peduncle lesion in the midbrain is giving him right sided hemiplegia.  He has some mild incoordination involving left side from cerebellar lesions.  Since we see infarcts bilaterally, these may have been embolic but source not identified so far.  Continue risk factor modification and antiplatelet therapy.  He will need long term surveillance for atrial fibrillation, which can be arranged in his home state where he can have more close follow up.  Awaiting placement.  11/8, MS, MD 06/11/2019 3:25  PM    To contact Stroke Continuity provider, please refer to 12/8. After hours, contact General Neurology

## 2019-06-12 LAB — GLUCOSE, CAPILLARY
Glucose-Capillary: 135 mg/dL — ABNORMAL HIGH (ref 70–99)
Glucose-Capillary: 174 mg/dL — ABNORMAL HIGH (ref 70–99)
Glucose-Capillary: 290 mg/dL — ABNORMAL HIGH (ref 70–99)

## 2019-06-12 NOTE — Progress Notes (Signed)
Patient appeared upset. Nurse used translator to communicate patient needs. Patient stated (via translator) "of course I am upset about everything". When asked I there is anything nurse could do patient stated "no". Nurse attempted 3x to administer AM medications and patient refused each time, covering his mouth with his hand and stating "I don't want". Nurse will continue to monitor. Gwendolyn Grant, RN

## 2019-06-12 NOTE — Progress Notes (Signed)
STROKE TEAM PROGRESS NOTE   INTERVAL HISTORY He is crying a lot and upset that he is stuck here without SNF placement or rehab.  He is also worried about deportation.     MRI - acute left PCA territory infarct involving left thalamus, left midbrain cerebral peduncle, and left medial temporal and occipital lobes, and left cerebellum.  There appear to be some right cerebellum and right pons small infarcts too.    He is s/p mechanical thrombectomy at left PCA.  TTE and TEE are negative.  Tele and ECG showed NSR so far. A1c was 12.      Vitals:   06/11/19 1923 06/11/19 2311 06/12/19 0329 06/12/19 0807  BP: 123/81 105/78 128/78 (!) 147/95  Pulse: 93 96 93 98  Resp: 18 18 18 18   Temp: 98.6 F (37 C) 98.9 F (37.2 C) 98.7 F (37.1 C) 98.6 F (37 C)  TempSrc: Oral Oral Oral Oral  SpO2: 100% 100% 99% 100%  Weight:      Height:        CBC:  Recent Labs  Lab 06/08/19 0604 06/11/19 0508  WBC 5.9 6.9  HGB 11.7* 11.9*  HCT 36.8* 38.1*  MCV 72.2* 72.8*  PLT 339 333    Basic Metabolic Panel:  Recent Labs  Lab 06/08/19 0604 06/11/19 0508  NA 137 136  K 4.1 4.3  CL 101 100  CO2 26 24  GLUCOSE 107* 107*  BUN 9 10  CREATININE 0.81 0.77  CALCIUM 9.7 9.9    IMAGING last 24h No results found.   PHYSICAL EXAM  Temp:  [97.7 F (36.5 C)-98.9 F (37.2 C)] 98.6 F (37 C) (10/25 0807) Pulse Rate:  [91-100] 98 (10/25 0807) Resp:  [16-18] 18 (10/25 0807) BP: (105-147)/(73-95) 147/95 (10/25 0807) SpO2:  [99 %-100 %] 100 % (10/25 0807)  Awake, alert, speaks mostly french. RUE and RLE 0/5  LUE and LLE 5/5 Tongue deviates to the right.  EOMI, PERL Sensory reduced on the RUE and RLE.    ASSESSMENT/PLAN Mr. Trapper Meech is a 53 y.o. male with no significant past medical history presenting with right-sided weakness, decreased sensation on the right, diplopia and headache. Complete revascularization of occluded L PCA w/ mechanical thrombectomy.  Stroke: embolic multifocal  posterior circulation infarcts with left P2 occlusion s/p IR with TICI3 reperfusion - likely embolic   Code Stroke CT head No acute abnormality. Old infarct L frontal lobe.   CTA head & neck L P2 occlusion. Mild neck atherosclerosis . No LVO anterior circulation. Mild mid BA narrowing.  CT perfusion ischemia w/o visible core  CT Head - 04/16/19 - Areas of acute infarction are showing low-density and swelling but no macroscopic hemorrhage by CT.    Cerebral angio TICI3 reperfusion of occluded L PCA  MRI  L PCA (including thalamus), L>R cerebellum, R pontine and left midbrain infarct. Petechial hemorrhage seen in pons. Old L frontal infarcts.   LE Doppler no DVT - no evidence of deep vein thrombosis   2D Echo - EF 55-60%.  No cardiac source of emboli identified.   TCD bubble no HITS  TEE neg  UDS - positive for benzos - UDS done on 9/1  EEG - cortical dysfunction but no seizures.  LDL 88  HgbA1c 12.3  Lovenox 40 mg sq daily  for VTE prophylaxis  No antithrombotic prior to admission, finished DAPT 3-week course, now on aspirin alone   Therapy recommendations:  CIR -> D/t lack of post hospitalization family  support - will pursue SNF - will need rapid covid testing prior to D/C  Disposition:  pending (SW trying to help family w/ placement, MA MCD and transfer back to [place])  Medically ready for d/c when bed found  Patient remains stable  Possible seizure  Full body rigid shaking hours post IR, L>R  Loaded with keppra  EEG - cortical dysfunction but no seizures.  Tapered off keppra 10/23  Seizure precautions  Hypertension  No hx of HTN, on no home meds  Placed On cleviprex gtt for BP control post IR, now off . Discontinue amlodipine 10 . Now on lotensin to 10mg  bid  . BP stable . BP goal normotensive  Hyperlipidemia  Home meds:  No statin  LDL 88, goal < 70  HDL 39  On lipitor 20  Continue statin at discharge  Diabetes type II, new diagnosis,  Uncontrolled Hyperglycemia, improving  HgbA1c 12.3, goal < 7.0  continue metformin 1000mg  bid   off glipizde to 5 mg bid d/t hypoglycemia  glipizide 2.5mg  QA C -> 2.5mg  bid -> hypoglycemia 10/6 -> back to 2.5 mg qac ->glucose lowish 10/18 -> glipizide discontinued  DM coordinator following - should glucoses remain elevated. May benefit from SGLT-2inhibitior if he gets insurance   SSI 0-15 scale, 0-5 qhs  CBG stable so far  Dysphagia Malnutrition  . Secondary to stroke . Speech on board . Encourage po intake . Off IVF, lab stable . On glucerna shakes tid . Now cleared for D3 thin liquids in cup only, meds whole w/ liquids in cup  UTI with fever, resolved  UA showed WBC > 50  Urine culture enterobactor and staph coag neg  Sensitive to cipro  Finished cipro 500mg  bid course (ends 9/8)  Fever resolved   Other Stroke Risk Factors   hx of stroke - small remote appearing infarcts in the left frontal lobe by CT and MRI  Other Active Problems  Microcytic anemia - iron 38, Ferritin 115 - put on iron tab bid. Hb 11.7 ->11.9 (stable) (Bmet stable)  Pt found on floor 04/23/19 by nursing staff. No obvious injury, complaints of pain, or neurologic changes.   Pt had 2-3 times slip off chair while sitting in chair. No fall, no injury. - now move to room 13 closer to nurse station.    Pt had decreased UOP 10/8, put on IVF @ 100 for 24 hours.   Occasional low BP - 89/58 (06/11/19)  Hospital day # 32   The left thalamic lesions is giving him right sided numbness and the left cerebral peduncle lesion in the midbrain is giving him right sided hemiplegia.  He has some mild incoordination involving left side from cerebellar lesions.  Since we see infarcts bilaterally, these may have been embolic but source not identified so far.  Continue risk factor modification and antiplatelet therapy.  He will need long term surveillance for atrial fibrillation, which can be arranged in his home  state where he can have more close follow up.  Awaiting placement.  Rogue Jury, MS, MD 06/12/2019 10:46 AM    To contact Stroke Continuity provider, please refer to [email or web address] After hours, contact General Neurology

## 2019-06-12 NOTE — Progress Notes (Signed)
Pt refusing his medications even after several attempts, stated,"No", and turns away from the nurse, MD notified through page, will contuine to monitor

## 2019-06-13 LAB — GLUCOSE, CAPILLARY
Glucose-Capillary: 204 mg/dL — ABNORMAL HIGH (ref 70–99)
Glucose-Capillary: 321 mg/dL — ABNORMAL HIGH (ref 70–99)
Glucose-Capillary: 210 mg/dL — ABNORMAL HIGH (ref 70–99)

## 2019-06-13 NOTE — Progress Notes (Signed)
Offered Pt liquids and denied it. Asked Pt if he will take his medication and stated no. Asked Pt if he wanted to brush his teeth and he accepted it. Oral care was provided with minimal assistance. Offered to reposition Pt and he accepted. Pt is comfortable now and laughing in room. RN helped the Pt to feel a little better.

## 2019-06-13 NOTE — Progress Notes (Addendum)
Pt refusing all meds and supplements this morning, despite encouragement from RN.  Became tearful when talking to nurse and NT.

## 2019-06-13 NOTE — Progress Notes (Signed)
Physical Therapy Treatment Patient Details Name: Rodney Collier MRN: [id number] DOB: [birth year] Today's Date: 06/13/2019    History of Present Illness Pt is a 53 y.o. M with no known PMH who presents with right sided weakness, decreased sensation on right, diplopia and headache. CT showing remote L frontal infarcts, CTA head/neck with L P2 occlusion, MRI with acute L PCA infarct including much of L thalamus, L > R cerebellum, R pons. Now s/p complete revascularization of occluded L PCA with mechanical thrombectomy. ETT 8/27-8/28; self extubated 8/28.    PT Comments    Pt was seen for strengthening on R LE and work on STS along with standing stability.  Pt is demonstrating some reluctance to use RLE in standing due to pain of knee, and in chair is not having any difficulty with this same pain.  Talked with pt at length with interpreter as pt is declining to eat and to follow along with the expectations for his transfer to rehab.  Pt was being visited by SW at end of session to continue to discuss plans for discharge and recovery to get him to [place].  Follow Up Recommendations  SNF     Equipment Recommendations  Wheelchair (measurements PT);Wheelchair cushion (measurements PT);3in1 (PT);Other (comment)    Recommendations for Other Services Rehab consult     Precautions / Restrictions Precautions Precautions: Fall Precaution Comments: R hemiparesis and inattention. Required Braces or Orthoses: Knee Immobilizer - Right Knee Immobilizer - Right: (to use if attempting gait) Restrictions Weight Bearing Restrictions: No    Mobility  Bed Mobility Overal bed mobility: Needs Assistance Bed Mobility: Supine to Sit Rolling: Min assist   Supine to sit: Min assist     General bed mobility comments: assist R side due to weakness and trunk  Transfers Overall transfer level: Needs assistance Equipment used: 1 person hand held assist;Bilateral platform walker(Eva walker) Transfers:  Sit to/from [employer] Transfers Sit to Stand: Mod assist Stand pivot transfers: Mod assist       General transfer comment: supported R arm on walker with pt reporting R knee is too painful to stand intially  Ambulation/Gait                 Stairs             Wheelchair Mobility    Modified Rankin (Stroke Patients Only) Modified Rankin (Stroke Patients Only) Pre-Morbid Rankin Score: No symptoms Modified Rankin: Moderately severe disability     Balance Overall balance assessment: Needs assistance Sitting-balance support: Feet supported Sitting balance-Leahy Scale: Fair     Standing balance support: Bilateral upper extremity supported Standing balance-Leahy Scale: Poor                              Cognition Arousal/Alertness: Awake/alert Behavior During Therapy: Impulsive Overall Cognitive Status: Impaired/Different from baseline Area of Impairment: Problem solving;Awareness;Safety/judgement;Following commands;Attention                   Current Attention Level: Selective Memory: Decreased short-term memory Following Commands: Follows one step commands inconsistently;Follows one step commands with increased time Safety/Judgement: Decreased awareness of safety;Decreased awareness of deficits Awareness: Emergent;Intellectual Problem Solving: Decreased initiation;Slow processing;Difficulty sequencing;Requires verbal cues;Requires tactile cues General Comments: interpreter who is reporting his statements to PT and MD notes he is speaking more than [place] and [place]      Exercises Total Joint Exercises Ankle Circles/Pumps: AAROM;Both;5 reps Long Arc Quad: AAROM;Both;10 reps  General Comments General comments (skin integrity, edema, etc.): pt insisted on getting up to chair and was much more comfortable once up.  Elevated his legs, supported R arm on pillow      Pertinent Vitals/Pain Pain Assessment: Faces Faces Pain Scale:  Hurts even more Pain Location: right leg (entire leg) Pain Descriptors / Indicators: Grimacing Pain Intervention(s): Monitored during session;Repositioned    Home Living                      Prior Function            PT Goals (current goals can now be found in the care plan section) Acute Rehab PT Goals Patient Stated Goal: to get R knee to stop hurting Progress towards PT goals: Progressing toward goals    Frequency    Min 3X/week      PT Plan Current plan remains appropriate    Co-evaluation              AM-PAC PT "6 Clicks" Mobility   Outcome Measure  Help needed turning from your back to your side while in a flat bed without using bedrails?: A Little Help needed moving from lying on your back to sitting on the side of a flat bed without using bedrails?: A Little Help needed moving to and from a bed to a chair (including a wheelchair)?: A Lot Help needed standing up from a chair using your arms (e.g., wheelchair or bedside chair)?: A Lot Help needed to walk in hospital room?: A Lot Help needed climbing 3-5 steps with a railing? : Total 6 Click Score: 13    End of Session Equipment Utilized During Treatment: Gait belt Activity Tolerance: Patient limited by fatigue;Patient limited by pain Patient left: in chair;with call bell/phone within reach;with chair alarm set;with nursing/sitter in room Nurse Communication: Mobility status(MD and nursing in with pt and interpreter) PT Visit Diagnosis: Unsteadiness on feet (R26.81);Muscle weakness (generalized) (M62.81);Difficulty in walking, not elsewhere classified (R26.2);Hemiplegia and hemiparesis Hemiplegia - Right/Left: Right Hemiplegia - dominant/non-dominant: Non-dominant Hemiplegia - caused by: Cerebral infarction     Time: 9833-8250 PT Time Calculation (min) (ACUTE ONLY): 41 min  Charges:  $Therapeutic Exercise: 8-22 mins $Therapeutic Activity: 23-37 mins                 Ivar Drape 06/13/2019,  8:11 PM    Samul Dada, PT MS Acute Rehab Dept. Number: [facility] [id number] and [facility] [phone]

## 2019-06-13 NOTE — TOC Progression Note (Signed)
Transition of Care [facility]) - Progression Note    Patient Details  Name: Rodney Collier MRN: [id number] Date of Birth: [birth year]  Transition of Care [facility]) CM/SW [place], [place] Phone Number: 06/13/2019, 4:45 PM  Clinical Narrative:   CSW received calls back over the weekend from SNFs. Katie at [employer] at [employer] called back that the patient's Medicaid isn't the right kind to cover rehab in [place], something will need to be changed in order for his Medicaid to cover SNF. CSW also received a call back from [place] at [facility] that she doesn't work on Fridays, to call back to discuss the referral.  CSW alerted today by MD that interpreter was at the bedside and patient is crying, refusing to take meds, and refusing to eat, MD asked CSW to go to talk to patient with interpreter to explain barriers to SNF because he wasn't understanding. CSW met with patient with interpreter to try to explain to him the barriers to discharge, and patient began crying, talking about how his family has abandoned him and he doesn't understand because he has done so much for them and now they don't care about him anymore. CSW attempted to explain to patient that she was working with his family and that they want to get him home and everyone is trying very hard, but patient said he didn't believe CSW.   CSW got patient's daughter, Rodney Collier, on the phone to talk to the patient. Rodney Collier spoke with patient for a while, and then CSW talked with [facility] after she finished talking to patient. Patient was telling Rodney Collier that he feels like he should just die, that no one is trying for him and his life is just done now. Rodney Collier was crying during conversation, says she feels frustrated and overwhelmed and not sure what else to do to help. Rodney Collier asked about a therapist that could come and talk to him, and CSW indicated that there aren't therapists that come to the hospital. CSW asked Rodney Collier about a  chaplain visit, and Rodney Collier didn't think he would like that. CSW updated Rodney Collier on the calls back from facilities and that something will need to get fixed with his Medicaid, and Rodney Collier indicated that the admissions could reach out to her directly with what she needs to do to change it.  CSW also got patient's brother, Rodney Collier, on the phone per patient's request. Rodney Collier spoke with patient in his native language, attempted to discuss with patient how the family has been working hard to get him back home but the pandemic has been making it so much more difficult. Rodney Collier spoke with patient at length about what would make him feel better, trying to convince him to take his medications and eat some food. Rodney Collier asked CSW to bring the nurse in to see if the patient will take his medicine with his brother on the phone. CSW went to get the nurse, ask her to come in with medicine. Patient continued to refuse his meds from the nurse even with his brother on the phone. After a long duration of talking to the patient and trying to convince him to take care of himself, patient shut down, became frustrated, and pushed the phone away multiple times that he was done talking. CSW updated Rodney Collier on the current barriers, that his Medicaid doesn't cover SNF, and Rodney Collier asked about what needed to be done for that. CSW will follow back up with the SNF on how to update patient's Medicaid.  CSW also found a Games developer for patient's cell phone, as it had died. CSW charged patient's cell phone for him, and updated family that his cell phone should be charged and they could try to call him on his cell phone to talk to him. CSW also provided brother with the direct line to the patient's room so that they can call and reach him in his room without having to be redirected through the hospital.    Expected Discharge Plan: Skilled Nursing Facility Barriers to Discharge: SNF Pending bed offer, Homeless with medical needs, Other (comment)(needs placement in  [place])  Expected Discharge Plan and Services Expected Discharge Plan: [place] Choice: [place]                                         Social Determinants of Health (SDOH) Interventions    Readmission Risk Interventions No flowsheet data found.

## 2019-06-13 NOTE — Progress Notes (Signed)
Nutrition Follow-up  DOCUMENTATION CODES:   Not applicable  INTERVENTION:  Continue Glucerna Shake po TID, each supplement provides 220 kcal and 10 grams of protein.  Encourage adequate PO intake.   NUTRITION DIAGNOSIS:   Inadequate oral intake related to inability to eat as evidenced by NPO status; ongoing  GOAL:   Patient will meet greater than or equal to 90% of their needs; progressing  MONITOR:   PO intake, Supplement acceptance, Diet advancement, Skin, Weight trends, Labs, I & O's  REASON FOR ASSESSMENT:   Ventilator    ASSESSMENT:   [name] with no significant past medical history presenting with right-sided weakness, decreased sensation on the right, diplopia and headache. Complete revascularization of occluded L PCA w/ mechanical thrombectomy.  Pt continues on the dysphagia 3 diet with thin liquids. Meal completion has been 50-100% Pt currently has Glucerna shake ordered and has been consuming most of them. RD to continue with current orders to aid in caloric and protein needs. SNF placement pending.    Labs and medications reviewed.   Diet Order:   Diet Order            DIET DYS 3 Room service appropriate? Yes; Fluid consistency: Thin  Diet effective now              EDUCATION NEEDS:   Not appropriate for education at this time  Skin:  Skin Assessment: Reviewed RN Assessment Skin Integrity Issues:: Other (Comment) Other: N/A  Last BM:  10/25  Height:   Ht Readings from Last 1 Encounters:  04/19/19 6' (1.829 m)    Weight:   Wt Readings from Last 1 Encounters:  04/19/19 77 kg    Ideal Body Weight:  80.9 kg  BMI:  Body mass index is 23.02 kg/m.  Estimated Nutritional Needs:   Kcal:  2000-2200  Protein:  100-115 grams  Fluid:  > 2.0 L    [name], MS, RD, LDN Pager # [phone] After hours/ weekend pager # [phone]

## 2019-06-13 NOTE — Progress Notes (Signed)
STROKE TEAM PROGRESS NOTE   INTERVAL HISTORY Patient continues to refuse medications and food.  He is intermittently tearful.  He seems to understand [place] but speaks in [place] and at times his speech is gibberish.  I was unable to communicate well with him despite using a [place] language interpreter at the bedside.  His vital signs are stable.  Case manager has been in touch with his daughter as well as brother and sister-in-law but yet unable to find nursing home bed in [place] that will accept him    Vitals:   06/13/19 0308 06/13/19 0818 06/13/19 1100 06/13/19 1300  BP: (!) 132/94 120/79 123/78 111/66  Pulse: 80 85 70 81  Resp: 18 18 18 18   Temp: 98.2 F (36.8 C) 98.3 F (36.8 C) 98.2 F (36.8 C) 98.3 F (36.8 C)  TempSrc: Oral Oral Oral Oral  SpO2: 100% 99% 96% 98%  Weight:      Height:        CBC:  Recent Labs  Lab 06/08/19 0604 06/11/19 0508  WBC 5.9 6.9  HGB 11.7* 11.9*  HCT 36.8* 38.1*  MCV 72.2* 72.8*  PLT 339 102    Basic Metabolic Panel:  Recent Labs  Lab 06/08/19 0604 06/11/19 0508  NA 137 136  K 4.1 4.3  CL 101 100  CO2 26 24  GLUCOSE 107* 107*  BUN 9 10  CREATININE 0.81 0.77  CALCIUM 9.7 9.9    IMAGING last 24h No results found.   PHYSICAL EXAM  Temp:  [98 F (36.7 C)-98.3 F (36.8 C)] 98.3 F (36.8 C) (10/26 1300) Pulse Rate:  [70-101] 81 (10/26 1300) Resp:  [15-18] 18 (10/26 1300) BP: (111-142)/(66-95) 111/66 (10/26 1300) SpO2:  [96 %-100 %] 98 % (10/26 1300)        Frail middle aged african [place] male not in distress. . Afebrile. Head is nontraumatic. Neck is supple without bruit.    Cardiac exam no murmur or gallop. Lungs are clear to auscultation. Distal pulses are well felt. Neurological Exam: awake, alert, eyes open, following simple commands. nonfluent language but able to have some words articulated, hypophonia much improved.  Slight dysconjugate eyes, left eye mild INO, right eye CN VI incomplete palsy, up and downward  gaze palsy. PERRL. Not blinking to visual threat on the right.Rt hemianopia. Right facial droop, tongue midline, LUE 5/5 but significant ataxia. LLE 4/5 at least. On pain stimulation, slight withdraw of RLE but no movement of RUE. Increased muscle tone on the RUE. Positive triple reflex on the right. Sensation and gait not tested.      ASSESSMENT/PLAN [name] with no significant past medical history presenting with right-sided weakness, decreased sensation on the right, diplopia and headache. Complete revascularization of occluded L PCA w/ mechanical thrombectomy.  Stroke: embolic multifocal posterior circulation infarcts with left P2 occlusion s/p IR with TICI3 reperfusion - likely embolic   Code Stroke CT head No acute abnormality. Old infarct L frontal lobe.   CTA head & neck L P2 occlusion. Mild neck atherosclerosis . No LVO anterior circulation. Mild mid BA narrowing.  CT perfusion ischemia w/o visible core  CT Head - 04/16/19 - Areas of acute infarction are showing low-density and swelling but no macroscopic hemorrhage by CT.    Cerebral angio TICI3 reperfusion of occluded L PCA  MRI  L PCA (including thalamus), L>R cerebellum, R pontine and left midbrain infarct. Petechial hemorrhage seen in pons. Old L frontal infarcts.  LE Doppler no DVT - no evidence of deep vein thrombosis   2D Echo - EF 55-60%.  No cardiac source of emboli identified.   TCD bubble no HITS  TEE neg  UDS - positive for benzos - UDS done on 9/1  EEG - cortical dysfunction but no seizures.  LDL 88  HgbA1c 12.3  Lovenox 40 mg sq daily  for VTE prophylaxis  No antithrombotic prior to admission, finished DAPT 3-week course, now on aspirin alone   Therapy recommendations:  CIR -> D/t lack of post hospitalization family support - will pursue SNF - will need rapid covid testing prior to D/C  Disposition:  pending (SW trying to help family w/ placement, MA MCD and transfer  back to [place])  Medically ready for d/c when bed found  Patient remains stable  Possible seizure  Full body rigid shaking hours post IR, L>R  Loaded with keppra  EEG - cortical dysfunction but no seizures.  Tapered off keppra 10/23  Seizure precautions  Hypertension  No hx of HTN, on no home meds  Placed On cleviprex gtt for BP control post IR, now off . Discontinue amlodipine 10 . Now on lotensin to 10mg  bid  . BP stable . BP goal normotensive  Hyperlipidemia  Home meds:  No statin  LDL 88, goal < 70  HDL 39  On lipitor 20  Continue statin at discharge  Diabetes type II, new diagnosis, Uncontrolled Hyperglycemia, improving  HgbA1c 12.3, goal < 7.0  continue metformin 1000mg  bid   off glipizde to 5 mg bid d/t hypoglycemia  glipizide 2.5mg  QA C -> 2.5mg  bid -> hypoglycemia 10/6 -> back to 2.5 mg qac ->glucose lowish 10/18 -> glipizide discontinued  DM coordinator following - should glucoses remain elevated. May benefit from SGLT-2inhibitior if he gets insurance   SSI 0-15 scale, 0-5 qhs  CBG stable so far  Dysphagia Malnutrition  . Secondary to stroke . Speech on board . Encourage po intake . Off IVF, lab stable . On glucerna shakes tid . Now cleared for D3 thin liquids in cup only, meds whole w/ liquids in cup  UTI with fever, resolved  UA showed WBC > 50  Urine culture enterobactor and staph coag neg  Sensitive to cipro  Finished cipro 500mg  bid course (ends 9/8)  Fever resolved   Other Stroke Risk Factors   hx of stroke - small remote appearing infarcts in the left frontal lobe by CT and MRI  Other Active Problems  Microcytic anemia - iron 38, Ferritin 115 - put on iron tab bid. Hb 11.7 ->11.9 (stable) (Bmet stable)  Pt found on floor 04/23/19 by nursing staff. No obvious injury, complaints of pain, or neurologic changes.   Pt had 2-3 times slip off chair while sitting in chair. No fall, no injury. - now move to room 13 closer  to nurse station.    Pt had decreased UOP 10/8, put on IVF @ 100 for 24 hours.   Occasional low BP - 89/58 (06/11/19)  Hospital day # 60   Patient unfortunately is difficult to place and will remain here till skilled nursing facility but is available in 06/23/19.  Discussed with case manager and with the patient using 12/8 language interpreter at the bedside.  Greater than 50% time during this 25-minute visit was spent on counseling and coordination of care and discussion with care team 06/13/2019 3:34 PM    To contact Stroke Continuity provider, please refer to [place]. After hours, contact  General Neurology

## 2019-06-14 LAB — GLUCOSE, CAPILLARY
Glucose-Capillary: 313 mg/dL — ABNORMAL HIGH (ref 70–99)
Glucose-Capillary: 197 mg/dL — ABNORMAL HIGH (ref 70–99)
Glucose-Capillary: 262 mg/dL — ABNORMAL HIGH (ref 70–99)

## 2019-06-14 NOTE — Progress Notes (Signed)
STROKE TEAM PROGRESS NOTE   INTERVAL HISTORY Patient continues to refuse medications and food.  He was able to speak to his daughter as well as brother yesterday and social worker explained to him the difficulty in moving him to a nursing home bed in [place] despite ongoing efforts.   His vital signs are stable.  No neurological changes   Vitals:   06/13/19 2302 06/14/19 0338 06/14/19 0750 06/14/19 1139  BP: 131/81 132/86 125/77 131/81  Pulse: 84 81 97 95  Resp: 16 15 16 15   Temp: 98.3 F (36.8 C) 97.7 F (36.5 C) 98.7 F (37.1 C) 98.2 F (36.8 C)  TempSrc: Oral Oral Oral Oral  SpO2: 99% 98% 100% 99%  Weight:      Height:        CBC:  Recent Labs  Lab 06/08/19 0604 06/11/19 0508  WBC 5.9 6.9  HGB 11.7* 11.9*  HCT 36.8* 38.1*  MCV 72.2* 72.8*  PLT 339 333    Basic Metabolic Panel:  Recent Labs  Lab 06/08/19 0604 06/11/19 0508  NA 137 136  K 4.1 4.3  CL 101 100  CO2 26 24  GLUCOSE 107* 107*  BUN 9 10  CREATININE 0.81 0.77  CALCIUM 9.7 9.9    IMAGING last 24h No results found.   PHYSICAL EXAM  Temp:  [97.7 F (36.5 C)-98.7 F (37.1 C)] 98.2 F (36.8 C) (10/27 1139) Pulse Rate:  [81-98] 95 (10/27 1139) Resp:  [15-18] 15 (10/27 1139) BP: (125-138)/(77-97) 131/81 (10/27 1139) SpO2:  [98 %-100 %] 99 % (10/27 1139)        Frail middle aged african 06-07-1980 male not in distress. . Afebrile. Head is nontraumatic. Neck is supple without bruit.    Cardiac exam no murmur or gallop. Lungs are clear to auscultation. Distal pulses are well felt. Neurological Exam: awake, alert, eyes open, following simple commands. nonfluent language but able to have some words articulated, hypophonia much improved.  Slight dysconjugate eyes, left eye mild INO, right eye CN VI incomplete palsy, up and downward gaze palsy. PERRL. Not blinking to visual threat on the right.Rt hemianopia. Right facial droop, tongue midline, LUE 5/5 but significant ataxia. LLE 4/5 at least. On pain  stimulation, slight withdraw of RLE but no movement of RUE. Increased muscle tone on the RUE. Positive triple reflex on the right. Sensation and gait not tested.      ASSESSMENT/PLAN Mr. Marbin Olshefski is a 53 y.o. male with no significant past medical history presenting with right-sided weakness, decreased sensation on the right, diplopia and headache. Complete revascularization of occluded L PCA w/ mechanical thrombectomy.  Stroke: embolic multifocal posterior circulation infarcts with left P2 occlusion s/p IR with TICI3 reperfusion - likely embolic   Code Stroke CT head No acute abnormality. Old infarct L frontal lobe.   CTA head & neck L P2 occlusion. Mild neck atherosclerosis . No LVO anterior circulation. Mild mid BA narrowing.  CT perfusion ischemia w/o visible core  CT Head - 04/16/19 - Areas of acute infarction are showing low-density and swelling but no macroscopic hemorrhage by CT.    Cerebral angio TICI3 reperfusion of occluded L PCA  MRI  L PCA (including thalamus), L>R cerebellum, R pontine and left midbrain infarct. Petechial hemorrhage seen in pons. Old L frontal infarcts.   LE Doppler no DVT - no evidence of deep vein thrombosis   2D Echo - EF 55-60%.  No cardiac source of emboli identified.   TCD bubble no HITS  TEE neg  UDS - positive for benzos - UDS done on 9/1  EEG - cortical dysfunction but no seizures.  LDL 88  HgbA1c 12.3  Lovenox 40 mg sq daily  for VTE prophylaxis  No antithrombotic prior to admission, finished DAPT 3-week course, now on aspirin alone   Therapy recommendations:  CIR -> D/t lack of post hospitalization family support - will pursue SNF - will need rapid covid testing prior to D/C  Disposition:  pending (SW trying to help family w/ placement, [place] MCD and transfer back to [place])  Medically ready for d/c when bed found  Patient remains stable  Possible seizure  Full body rigid shaking hours post IR, L>R  Loaded with  keppra  EEG - cortical dysfunction but no seizures.  Tapered off keppra 10/23  Seizure precautions  Hypertension  No hx of HTN, on no home meds  Placed On cleviprex gtt for BP control post IR, now off . Discontinue amlodipine 10 . Now on lotensin to 10mg  bid  . BP stable . BP goal normotensive  Hyperlipidemia  Home meds:  No statin  LDL 88, goal < 70  HDL 39  On lipitor 20  Continue statin at discharge  Diabetes type II, new diagnosis, Uncontrolled Hyperglycemia, improving  HgbA1c 12.3, goal < 7.0  continue metformin 1000mg  bid   off glipizde to 5 mg bid d/t hypoglycemia  glipizide 2.5mg  QA C -> 2.5mg  bid -> hypoglycemia 10/6 -> back to 2.5 mg qac ->glucose lowish 10/18 -> glipizide discontinued  DM coordinator following - should glucoses remain elevated. May benefit from SGLT-2inhibitior if he gets insurance   SSI 0-15 scale, 0-5 qhs  CBG stable so far  Dysphagia Malnutrition  . Secondary to stroke . Speech on board . Encourage po intake . Off IVF, lab stable . On glucerna shakes tid . Now cleared for D3 thin liquids in cup only, meds whole w/ liquids in cup  UTI with fever, resolved  UA showed WBC > 50  Urine culture enterobactor and staph coag neg  Sensitive to cipro  Finished cipro 500mg  bid course (ends 9/8)  Fever resolved   Other Stroke Risk Factors   hx of stroke - small remote appearing infarcts in the left frontal lobe by CT and MRI  Other Active Problems  Microcytic anemia - iron 38, Ferritin 115 - put on iron tab bid. Hb 11.7 ->11.9 (stable) (Bmet stable)  Pt found on floor 04/23/19 by nursing staff. No obvious injury, complaints of pain, or neurologic changes.   Pt had 2-3 times slip off chair while sitting in chair. No fall, no injury. - now move to room 13 closer to nurse station.    Pt had decreased UOP 10/8, put on IVF @ 100 for 24 hours.   Occasional low BP - 89/58 (06/11/19)  Hospital day # 46   Patient  unfortunately is difficult to place and will remain here till skilled nursing facility but is available in [place].  Discussed with case manager and with the patient. 06/14/2019 4:09 PM    To contact Stroke Continuity provider, please refer to [email or web address] After hours, contact General Neurology

## 2019-06-14 NOTE — TOC Progression Note (Signed)
Transition of Care [facility]) - Progression Note    Patient Details  Name: Rodney Collier MRN: [id number] Date of Birth: 01-08-1966  Transition of Care [facility]) CM/SW [place], [place] Work Phone Number: 06/14/2019, 5:20 PM  Clinical Narrative:   MSW intern and MSW supervisor went to see pt and touch base with him. He was continuing to struggle with making his needs known in [place]. He seemed concerned about his cell phone and room phone. MSW Intern located the room phone on the floor that he was searching for and MSW supervisor assured him that the battery was fine on his cell phone. After attempting to communicate, Pt indicated that he would like to sit up and eat. A nurse was called to assist him. MSW supervisor contacted Joellen Jersey at [employer] in [place], left a voicemail. MSW supervisor will continue to follow.      Expected Discharge Plan: [place] Barriers to Discharge: SNF Pending bed offer, Homeless with medical needs, Other (comment)(needs placement in [place])  Expected Discharge Plan and Services Expected Discharge Plan: [place] Choice: [place]                                         Social Determinants of Health (SDOH) Interventions    Readmission Risk Interventions No flowsheet data found.

## 2019-06-15 LAB — COMPREHENSIVE METABOLIC PANEL
ALT: 19 U/L (ref 0–44)
AST: 17 U/L (ref 15–41)
Albumin: 3.3 g/dL — ABNORMAL LOW (ref 3.5–5.0)
Alkaline Phosphatase: 69 U/L (ref 38–126)
Anion gap: 10 (ref 5–15)
BUN: 14 mg/dL (ref 6–20)
CO2: 25 mmol/L (ref 22–32)
Calcium: 9.9 mg/dL (ref 8.9–10.3)
Chloride: 101 mmol/L (ref 98–111)
Creatinine, Ser: 0.83 mg/dL (ref 0.61–1.24)
GFR calc Af Amer: >60 mL/min (ref >60)
GFR calc non Af Amer: >60 mL/min (ref >60)
Glucose, Bld: 177 mg/dL — ABNORMAL HIGH (ref 70–99)
Potassium: 3.9 mmol/L (ref 3.5–5.1)
Sodium: 136 mmol/L (ref 135–145)
Total Bilirubin: 0.3 mg/dL (ref 0.3–1.2)
Total Protein: 6.9 g/dL (ref 6.5–8.1)

## 2019-06-15 LAB — CBC
HCT: 40.4 % (ref 39.0–52.0)
Hemoglobin: 12.8 g/dL — ABNORMAL LOW (ref 13.0–17.0)
MCH: 22.9 pg — ABNORMAL LOW (ref 26.0–34.0)
MCHC: 31.7 g/dL (ref 30.0–36.0)
MCV: 72.1 fL — ABNORMAL LOW (ref 80.0–100.0)
Platelets: 297 10*3/uL (ref 150–400)
RBC: 5.6 MIL/uL (ref 4.22–5.81)
RDW: 14.6 % (ref 11.5–15.5)
WBC: 6 10*3/uL (ref 4.0–10.5)
nRBC: 0 % (ref 0.0–0.2)

## 2019-06-15 LAB — GLUCOSE, CAPILLARY
Glucose-Capillary: 100 mg/dL — ABNORMAL HIGH (ref 70–99)
Glucose-Capillary: 122 mg/dL — ABNORMAL HIGH (ref 70–99)
Glucose-Capillary: 98 mg/dL (ref 70–99)
Glucose-Capillary: 195 mg/dL — ABNORMAL HIGH (ref 70–99)
Glucose-Capillary: 218 mg/dL — ABNORMAL HIGH (ref 70–99)
Glucose-Capillary: 191 mg/dL — ABNORMAL HIGH (ref 70–99)
Glucose-Capillary: 220 mg/dL — ABNORMAL HIGH (ref 70–99)
Glucose-Capillary: 252 mg/dL — ABNORMAL HIGH (ref 70–99)
Glucose-Capillary: 239 mg/dL — ABNORMAL HIGH (ref 70–99)

## 2019-06-15 NOTE — Plan of Care (Signed)
Patient progressing towards plan of care goals. 

## 2019-06-15 NOTE — Progress Notes (Signed)
  Speech Language Pathology Treatment: Dysphagia;Cognitive-Linquistic  Patient Details Name: Rodney Collier MRN: [id number] DOB: 1966-06-30 Today's Date: 06/15/2019 Time: 2197-5883 SLP Time Calculation (min) (ACUTE ONLY): 16 min  Assessment / Plan / Recommendation Clinical Impression  Pt was encountered awake/alert, lying reclined in bed.  Pt was seen for ST targeting dysphagia and aphasia.  Pt consumed trials of regular solids (entire cracker packet) given set up and he exhibited timely mastication and AP transport.  He was observed to be impulsive, taking large bites despite verbal cues.  No overt s/sx of aspiration were observed.  Pt refused all liquid trials, despite being offered a preferred drink.  Pt answered basic yes/no questions with 90% accuracy independently and complex yes/no questions with 7/10 (70%) accuracy independently, improving to 90% given min-mod verbal cues.  Pt followed basic 1-step commands with 100% accuracy independently.  He followed basic 2-step commands with 6/10 (60%) accuracy, improving to 100% given mod-max verbal, visual, and tactile cues.  Pt answered SLP questions in conversational speech with a mixture of [place] and [place].  Secondary to impulsivity, recommend continuation of Dysphagia 3 (mech soft) solids and thin liquid (no straws).  SLP will f/u for dysphagia and cognitive-linguistic treatment per POC.    HPI HPI: Pt is a 53 y.o. M with no known PMH who presents with right sided weakness, decreased sensation on right, diplopia and headache. CT showing remote L frontal infarcts, CTA head/neck with L P2 occlusion, MRI with acute L PCA infarct including much of L thalamus, L > R cerebellum, R pons. Now s/p complete revascularization of occluded L PCA with mechanical thrombectomy. ETT 8/27-8/28; self extubated 8/28.      SLP Plan  Continue with current plan of care       Recommendations  Diet recommendations: Dysphagia 3 (mechanical soft);Thin liquid Liquids  provided via: Cup;No straw Medication Administration: Whole meds with liquid Supervision: Patient able to self feed;Full supervision/cueing for compensatory strategies Compensations: Minimize environmental distractions;Small sips/bites;Slow rate;Lingual sweep for clearance of pocketing Postural Changes and/or Swallow Maneuvers: Seated upright 90 degrees;Upright 30-60 min after meal                Oral Care Recommendations: Oral care BID Follow up Recommendations: Skilled Nursing facility;24 hour supervision/assistance SLP Visit Diagnosis: Dysphagia, oropharyngeal phase (R13.12);Aphasia (R47.01) Plan: Continue with current plan of care       Bretta Bang, M.S., [place] Office: [phone]               [place] 06/15/2019, 2:43 PM

## 2019-06-15 NOTE — Progress Notes (Signed)
Physical Therapy Treatment Patient Details Name: Rodney Collier MRN: [id number] DOB: September 01, 1965 Today's Date: 06/15/2019    History of Present Illness Pt is a 53 y.o. M with no known PMH who presents with right sided weakness, decreased sensation on right, diplopia and headache. CT showing remote L frontal infarcts, CTA head/neck with L P2 occlusion, MRI with acute L PCA infarct including much of L thalamus, L > R cerebellum, R pons. Now s/p complete revascularization of occluded L PCA with mechanical thrombectomy. ETT 8/27-8/28; self extubated 8/28.    PT Comments    Pt agreeable to wheelchair mobility in the hallway this session. He was able to verbalize which side the wheelchair was to go on for transfer both to and from it, and required overall min guard assist to complete scoot transfer. Very motivated and moving quickly initially in hallway however after ~100 feet stopped and got very upset, not wanting to go any farther. It was difficult to discern what exactly happened as the interpreter did not show up to help interpret pt's [place], however it appeared that pt was possibly frustrated with how quickly he fatigued. Pt stated what sounded like "I feel like I lost all power" and was tearful. Attempted to engage pt in activity on the way back to the room to look for letters that PT had taped to the wall, however pt not interested. Will continue to follow and progress as able per POC. Message left for another interpreter session scheduled for 10/30 from 1-3pm.     Follow Up Recommendations  SNF     Equipment Recommendations  Wheelchair (measurements PT);Wheelchair cushion (measurements PT);3in1 (PT);Other (comment)    Recommendations for Other Services       Precautions / Restrictions Precautions Precautions: Fall Precaution Comments: R hemiparesis and inattention. Required Braces or Orthoses: Knee Immobilizer - Right(For ambulation attempts) Restrictions Weight Bearing Restrictions:  No    Mobility  Bed Mobility Overal bed mobility: Needs Assistance Bed Mobility: Supine to Sit;Sit to Supine     Supine to sit: Min guard Sit to supine: Min assist   General bed mobility comments: Pt was able to transition to sitting without assistance, however required min assist for LE elevation back into bed at end of session. VC's for RLE placement on floor prior to initiating scoot transfer to wheelchair.   Transfers Overall transfer level: Needs assistance Equipment used: None Transfers: Lateral/Scoot Transfers          Lateral/Scoot Transfers: Min guard General transfer comment: Pt was able to laterally scoot to the L for transition to wheelchair, and then back into the bed at end of session. Pt was able to verbalize which way he wanted the wheelchair positioned for a successful transfer. Hands-on guarding provided for safety but pt did not require any assistance.   Ambulation/Gait                 Psychologist, counselling mobility: Yes Wheelchair propulsion: Left upper extremity;Left lower extremity Wheelchair parts: Needs assistance Distance: 100 Wheelchair Assistance Details (indicate cue type and reason): Up to mod assist for [facility] steering as pt tends to list to the right, cues to use his Left foot as he often holds it up in the air during WC mobility instead of using it to steer  Modified Rankin (Stroke Patients Only) Modified Rankin (Stroke Patients Only) Pre-Morbid Rankin Score: No symptoms Modified Rankin: Moderately severe disability  Balance Overall balance assessment: Needs assistance Sitting-balance support: Feet supported Sitting balance-Leahy Scale: Fair     Standing balance support: Bilateral upper extremity supported Standing balance-Leahy Scale: Poor                              Cognition Arousal/Alertness: Awake/alert Behavior During Therapy: Impulsive Overall  Cognitive Status: Impaired/Different from baseline Area of Impairment: Problem solving;Awareness;Safety/judgement;Following commands;Attention                       Following Commands: Follows one step commands with increased time;Follows multi-step commands inconsistently Safety/Judgement: Decreased awareness of safety;Decreased awareness of deficits Awareness: Intellectual Problem Solving: Decreased initiation;Slow processing;Difficulty sequencing;Requires verbal cues;Requires tactile cues        Exercises      General Comments        Pertinent Vitals/Pain Pain Assessment: Faces Faces Pain Scale: No hurt Pain Intervention(s): Monitored during session;Repositioned    Home Living                      Prior Function            PT Goals (current goals can now be found in the care plan section) Acute Rehab PT Goals Patient Stated Goal: None stated PT Goal Formulation: With patient Time For Goal Achievement: 06/22/19 Potential to Achieve Goals: Good Progress towards PT goals: Progressing toward goals    Frequency    Min 3X/week      PT Plan Current plan remains appropriate    Co-evaluation              AM-PAC PT "6 Clicks" Mobility   Outcome Measure  Help needed turning from your back to your side while in a flat bed without using bedrails?: A Little Help needed moving from lying on your back to sitting on the side of a flat bed without using bedrails?: A Little Help needed moving to and from a bed to a chair (including a wheelchair)?: A Little Help needed standing up from a chair using your arms (e.g., wheelchair or bedside chair)?: A Lot Help needed to walk in hospital room?: A Lot Help needed climbing 3-5 steps with a railing? : Total 6 Click Score: 14    End of Session Equipment Utilized During Treatment: Administrator, [employer]) Activity Tolerance: Patient limited by fatigue Patient left: in bed;with call bell/phone within reach;with bed  alarm set Nurse Communication: Mobility status PT Visit Diagnosis: Unsteadiness on feet (R26.81);Other symptoms and signs involving the nervous system (R29.898);Pain Hemiplegia - Right/Left: Right Hemiplegia - dominant/non-dominant: Non-dominant Hemiplegia - caused by: Cerebral infarction     Time: 1310-1400 PT Time Calculation (min) (ACUTE ONLY): 50 min  Charges:  $Wheel Chair Management: 38-52 mins                     Rolinda Roan, PT, DPT Acute Rehabilitation Services Pager: [phone] Office: [phone]    Thelma Comp 06/15/2019, 2:55 PM

## 2019-06-15 NOTE — Progress Notes (Addendum)
STROKE TEAM PROGRESS NOTE   INTERVAL HISTORY Patient continues to refuse medications and food.  Rodney Collier  He is frustrated  . Case Mgt still working on SNF in [place]. No new issues or complaints otherwise. Neuro exam stable, no change.   Vitals:   06/14/19 2323 06/15/19 0328 06/15/19 0914 06/15/19 1626  BP: (!) 126/94 115/78 122/82 (!) 142/79  Pulse: 85 78 99 88  Resp: 15 15 16 14   Temp: 98.4 F (36.9 C) 97.7 F (36.5 C) 98.6 F (37 C) 98.2 F (36.8 C)  TempSrc: Oral Oral Oral Oral  SpO2: 100% 100% 99% 100%  Weight:      Height:        CBC:  Recent Labs  Lab 06/11/19 0508 06/15/19 0355  WBC 6.9 6.0  HGB 11.9* 12.8*  HCT 38.1* 40.4  MCV 72.8* 72.1*  PLT 333 938    Basic Metabolic Panel:  Recent Labs  Lab 06/11/19 0508 06/15/19 0355  NA 136 136  K 4.3 3.9  CL 100 101  CO2 24 25  GLUCOSE 107* 177*  BUN 10 14  CREATININE 0.77 0.83  CALCIUM 9.9 9.9    IMAGING last 24h No results found.   PHYSICAL EXAM  Temp:  [97.7 F (36.5 C)-98.7 F (37.1 C)] 98.2 F (36.8 C) (10/28 1626) Pulse Rate:  [78-99] 88 (10/28 1626) Resp:  [14-16] 14 (10/28 1626) BP: (110-142)/(78-94) 142/79 (10/28 1626) SpO2:  [98 %-100 %] 100 % (10/28 1626)        Frail middle aged african [place] male not in distress. Afebrile. Head is nontraumatic. Neck is supple without bruit. Cardiac exam no murmur or gallop. Lungs are clear to auscultation. Distal pulses are well felt. Neurological Exam: awake, alert, eyes open, following simple commands. She make paraphasic errors, but able to get some points across. Fluent aphasia, able to name 1/3 items. Initial dysconjugate gaze, left eye mild INO, right eye CN VI incomplete palsy, up and downward gaze palsy. PERRL. Not blinking to visual threat on the right. Rt hemianopia. Right facial droop, tongue midline, LUE 5/5 but significant ataxia. LLE 4-/5 at least. Decreased sensation on Right side. Increased muscle tone on the RUE. Positive triple reflex on the right.  gait not tested.  ASSESSMENT/PLAN [name] with no significant past medical history presenting with right-sided weakness, decreased sensation on the right, diplopia and headache. Complete revascularization of occluded L PCA w/ mechanical thrombectomy.  Stroke: embolic multifocal posterior circulation infarcts with left P2 occlusion s/p IR with TICI3 reperfusion - likely embolic   Code Stroke CT head No acute abnormality. Old infarct L frontal lobe.   CTA head & neck L P2 occlusion. Mild neck atherosclerosis . No LVO anterior circulation. Mild mid BA narrowing.  CT perfusion ischemia w/o visible core  CT Head - 04/16/19 - Areas of acute infarction are showing low-density and swelling but no macroscopic hemorrhage by CT.    Cerebral angio TICI3 reperfusion of occluded L PCA  MRI  L PCA (including thalamus), L>R cerebellum, R pontine and left midbrain infarct. Petechial hemorrhage seen in pons. Old L frontal infarcts.   LE Doppler no DVT - no evidence of deep vein thrombosis   2D Echo - EF 55-60%.  No cardiac source of emboli identified.   TCD bubble no HITS  TEE neg  UDS - positive for benzos - UDS done on 9/1  EEG - cortical dysfunction but no seizures.  LDL 88  HgbA1c 12.3  Lovenox 40 mg sq daily  for VTE prophylaxis  No antithrombotic prior to admission, finished DAPT 3-week course, now on aspirin alone   Therapy recommendations:  CIR -> D/t lack of post hospitalization family support - will pursue SNF - will need rapid covid testing prior to D/C  Disposition:  pending (SW trying to help family w/ placement in [place] MCD and transfer back to [facility])  Medically ready for d/c when bed found  Patient remains stable  Possible seizure  Full body rigid shaking hours post IR, L>R  Loaded with keppra  EEG - cortical dysfunction but no seizures.  Tapered off keppra 10/23  Seizure precautions  Hypertension  No hx of HTN, on no home  meds  Placed On cleviprex gtt for BP control post IR, now off . Discontinue amlodipine 10 . Now on lotensin to 10mg  bid  . BP stable . BP goal normotensive  Hyperlipidemia  Home meds:  No statin  LDL 88, goal < 70  HDL 39  On lipitor 20  Continue statin at discharge  Diabetes type II, new diagnosis, Uncontrolled Hyperglycemia, improving  HgbA1c 12.3, goal < 7.0  continue metformin 1000mg  bid   off glipizde to 5 mg bid d/t hypoglycemia  glipizide 2.5mg  QA C -> 2.5mg  bid -> hypoglycemia 10/6 -> back to 2.5 mg qac ->glucose lowish 10/18 -> glipizide discontinued  DM coordinator following - should glucoses remain elevated. May benefit from SGLT-2inhibitior if he gets insurance   SSI 0-15 scale, 0-5 qhs  CBG stable so far  Dysphagia Malnutrition  . Secondary to stroke . Speech on board . Encourage po intake . Off IVF, lab stable . On glucerna shakes tid . Now cleared for D3 thin liquids in cup only, meds whole w/ liquids in cup  UTI with fever, resolved  UA showed WBC > 50  Urine culture enterobactor and staph coag neg  Sensitive to cipro  Finished cipro 500mg  bid course (ended 9/8)  Fever resolved   Other Stroke Risk Factors   hx of stroke - small remote appearing infarcts in the left frontal lobe by CT and MRI  Other Active Problems  Microcytic anemia - iron 38, Ferritin 115 - put on iron tab bid. Hb 11.7 ->11.9 (stable; can monitor monthly)  Pt had 2-3 times slip off chair while sitting in chair. No fall, no injury. - now move to room 13 closer to nurse station.    Occasional low BP, asymptomatic   Hospital day # 60   Patient unfortunately is difficult to place and will remain here till skilled nursing facility but is available in .  Discussed with case manager and with the patient. 06/15/2019 5:24 PM Desiree Metzger-Cihelka, ARNP-C, ANVP-BC Pager: [phone]   To contact Stroke Continuity provider, please refer to  [place]. After hours, contact General Neurology

## 2019-06-16 LAB — GLUCOSE, CAPILLARY
Glucose-Capillary: 245 mg/dL — ABNORMAL HIGH (ref 70–99)
Glucose-Capillary: 264 mg/dL — ABNORMAL HIGH (ref 70–99)
Glucose-Capillary: 289 mg/dL — ABNORMAL HIGH (ref 70–99)
Glucose-Capillary: 214 mg/dL — ABNORMAL HIGH (ref 70–99)
Glucose-Capillary: 292 mg/dL — ABNORMAL HIGH (ref 70–99)

## 2019-06-16 NOTE — Progress Notes (Signed)
  Speech Language Pathology Treatment: Cognitive-Linquistic  Patient Details Name: Rodney Collier MRN: [id number] DOB: 1965-10-19 Today's Date: 06/16/2019 Time: 1135-1150 SLP Time Calculation (min) (ACUTE ONLY): 15 min  Assessment / Plan / Recommendation Clinical Impression  Pt was seen for skilled ST targeting communication goals.  Pt presented with complaints of pain in abdomen but declined any interventions.  Pt appeared frustrated with staff members and told therapist "I don't believe you" but was unable to clearly explain the nature of his frustration due to decreased speech intelligibility, switching back and forth between languages, and word finding difficulty.  Therapist attempted to rephrase pt's complaints and tell them back to pt to verify pt's intent/message and pt was able to validate message at times, stating "exactly."  Pt most clearly stated "I want my freedom."  And stated that he wished to return to [place].  Pt even asked therapist to tell the "head" of his case (presumably his Rodney) that he wished to return to [place].  I ensured pt that I would include information in my daily progress report; however, I discussed how it was unlikely that pt's Rodney would clear him for long distance travel of that nature.  Pt was left in bed with bed alarm set and call bell within reach.  Continue per current plan of care.     HPI HPI: Pt is a 53 y.o. M with no known PMH who presents with right sided weakness, decreased sensation on right, diplopia and headache. CT showing remote L frontal infarcts, CTA head/neck with L P2 occlusion, MRI with acute L PCA infarct including much of L thalamus, L > R cerebellum, R pons. Now s/p complete revascularization of occluded L PCA with mechanical thrombectomy. ETT 8/27-8/28; self extubated 8/28.      SLP Plan  Continue with current plan of care       Recommendations  Diet recommendations: Dysphagia 3 (mechanical soft);Thin liquid Liquids provided via: Cup;No  straw Medication Administration: Whole meds with liquid Supervision: Patient able to self feed;Full supervision/cueing for compensatory strategies Compensations: Minimize environmental distractions;Small sips/bites;Slow rate;Lingual sweep for clearance of pocketing Postural Changes and/or Swallow Maneuvers: Seated upright 90 degrees;Upright 30-60 min after meal                Oral Care Recommendations: Oral care BID Follow up Recommendations: Skilled Nursing facility;24 hour supervision/assistance SLP Visit Diagnosis: Dysphagia, oropharyngeal phase (R13.12);Aphasia (R47.01) Plan: Continue with current plan of care       GO                Rodney Collier, Rodney Collier 06/16/2019, 12:20 PM

## 2019-06-16 NOTE — Progress Notes (Signed)
STROKE TEAM PROGRESS NOTE   INTERVAL HISTORY No changes.Patient continues to refuse medications and food.  .     . Case Mgt still working on SNF in [place]. No new issues or complaints otherwise. Neuro exam stable, no change.   Vitals:   06/16/19 0000 06/16/19 0400 06/16/19 0944 06/16/19 1218  BP: 126/80 111/80 129/80 124/85  Pulse: 82 79 (!) 101 85  Resp: 16 16 15 15   Temp: 97.9 F (36.6 C) 98.7 F (37.1 C) 98.9 F (37.2 C) 99 F (37.2 C)  TempSrc: Axillary Oral Oral Oral  SpO2: 100% 100% 99% 100%  Weight:      Height:        CBC:  Recent Labs  Lab 06/11/19 0508 06/15/19 0355  WBC 6.9 6.0  HGB 11.9* 12.8*  HCT 38.1* 40.4  MCV 72.8* 72.1*  PLT 333 229    Basic Metabolic Panel:  Recent Labs  Lab 06/11/19 0508 06/15/19 0355  NA 136 136  K 4.3 3.9  CL 100 101  CO2 24 25  GLUCOSE 107* 177*  BUN 10 14  CREATININE 0.77 0.83  CALCIUM 9.9 9.9    IMAGING last 24h No results found.   PHYSICAL EXAM  Temp:  [97.9 F (36.6 C)-99 F (37.2 C)] 99 F (37.2 C) (10/29 1218) Pulse Rate:  [79-101] 85 (10/29 1218) Resp:  [14-16] 15 (10/29 1218) BP: (111-142)/(79-93) 124/85 (10/29 1218) SpO2:  [99 %-100 %] 100 % (10/29 1218)        Frail middle aged african [place] male not in distress. Afebrile. Head is nontraumatic. Neck is supple without bruit. Cardiac exam no murmur or gallop. Lungs are clear to auscultation. Distal pulses are well felt. Neurological Exam: awake, alert, eyes open, following simple commands. She make paraphasic errors, but able to get some points across. Fluent aphasia, able to name 1/3 items. Initial dysconjugate gaze, left eye mild INO, right eye CN VI incomplete palsy, up and downward gaze palsy. PERRL. Not blinking to visual threat on the right. Rt hemianopia. Right facial droop, tongue midline, LUE 5/5 but significant ataxia. LLE 4-/5 at least. Decreased sensation on Right side. Increased muscle tone on the RUE. Positive triple reflex on the right. gait  not tested.  ASSESSMENT/PLAN Mr. Leontae Bostock is a 53 y.o. male with no significant past medical history presenting with right-sided weakness, decreased sensation on the right, diplopia and headache. Complete revascularization of occluded L PCA w/ mechanical thrombectomy.  Stroke: embolic multifocal posterior circulation infarcts with left P2 occlusion s/p IR with TICI3 reperfusion - likely embolic   Code Stroke CT head No acute abnormality. Old infarct L frontal lobe.   CTA head & neck L P2 occlusion. Mild neck atherosclerosis . No LVO anterior circulation. Mild mid BA narrowing.  CT perfusion ischemia w/o visible core  CT Head - 04/16/19 - Areas of acute infarction are showing low-density and swelling but no macroscopic hemorrhage by CT.    Cerebral angio TICI3 reperfusion of occluded L PCA  MRI  L PCA (including thalamus), L>R cerebellum, R pontine and left midbrain infarct. Petechial hemorrhage seen in pons. Old L frontal infarcts.   LE Doppler no DVT - no evidence of deep vein thrombosis   2D Echo - EF 55-60%.  No cardiac source of emboli identified.   TCD bubble no HITS  TEE neg  UDS - positive for benzos - UDS done on 9/1  EEG - cortical dysfunction but no seizures.  LDL 88  HgbA1c 12.3  Lovenox 40 mg sq daily  for VTE prophylaxis  No antithrombotic prior to admission, finished DAPT 3-week course, now on aspirin alone   Therapy recommendations:  CIR -> D/t lack of post hospitalization family support - will pursue SNF - will need rapid covid testing prior to D/C  Disposition:  pending (SW trying to help family w/ placement in [place] and transfer back to [facility])  Medically ready for d/c when bed found  Patient remains stable  Possible seizure  Full body rigid shaking hours post IR, L>R  Loaded with keppra  EEG - cortical dysfunction but no seizures.  Tapered off keppra 10/23  Seizure precautions  Hypertension  No hx of HTN, on no home meds  Placed  On cleviprex gtt for BP control post IR, now off . Discontinue amlodipine 10 . Now on lotensin to 10mg  bid  . BP stable . BP goal normotensive  Hyperlipidemia  Home meds:  No statin  LDL 88, goal < 70  HDL 39  On lipitor 20  Continue statin at discharge  Diabetes type II, new diagnosis, Uncontrolled Hyperglycemia, improving  HgbA1c 12.3, goal < 7.0  continue metformin 1000mg  bid   off glipizde to 5 mg bid d/t hypoglycemia  glipizide 2.5mg  QA C -> 2.5mg  bid -> hypoglycemia 10/6 -> back to 2.5 mg qac ->glucose lowish 10/18 -> glipizide discontinued  DM coordinator following - should glucoses remain elevated. May benefit from SGLT-2inhibitior if he gets insurance   SSI 0-15 scale, 0-5 qhs  CBG stable so far  Dysphagia Malnutrition  . Secondary to stroke . Speech on board . Encourage po intake . Off IVF, lab stable . On glucerna shakes tid . Now cleared for D3 thin liquids in cup only, meds whole w/ liquids in cup  UTI with fever, resolved  UA showed WBC > 50  Urine culture enterobactor and staph coag neg  Sensitive to cipro  Finished cipro 500mg  bid course (ended 9/8)  Fever resolved   Other Stroke Risk Factors   hx of stroke - small remote appearing infarcts in the left frontal lobe by CT and MRI  Other Active Problems  Microcytic anemia - iron 38, Ferritin 115 - put on iron tab bid. Hb 11.7 ->11.9 (stable; can monitor monthly)  Pt had 2-3 times slip off chair while sitting in chair. No fall, no injury. - now move to room 13 closer to nurse station.    Occasional low BP, asymptomatic   Hospital day # 47   Patient unfortunately is difficult to place and will remain here till skilled nursing facility but is available in .  Discussed with case manager and with the patient.  Plan psychiatry consult to help treat his depression and address competency. 06/16/2019 2:56 PM Desiree Metzger-Cihelka, ARNP-C, ANVP-BC Pager: [phone]   To  contact Stroke Continuity provider, please refer to [place]. After hours, contact General Neurology

## 2019-06-16 NOTE — Progress Notes (Signed)
Patient did refuse all medications in this shift, this is not new, he has been refusing medications since few days, MD is awared., pt is stable, and will continue to monitor.

## 2019-06-17 LAB — GLUCOSE, CAPILLARY
Glucose-Capillary: 222 mg/dL — ABNORMAL HIGH (ref 70–99)
Glucose-Capillary: 255 mg/dL — ABNORMAL HIGH (ref 70–99)
Glucose-Capillary: 283 mg/dL — ABNORMAL HIGH (ref 70–99)
Glucose-Capillary: 188 mg/dL — ABNORMAL HIGH (ref 70–99)
Glucose-Capillary: 285 mg/dL — ABNORMAL HIGH (ref 70–99)

## 2019-06-17 NOTE — Progress Notes (Signed)
Physical Therapy Treatment Patient Details Name: Rodney Collier MRN: [id number] DOB: 03/05/1966 Today's Date: 06/17/2019    History of Present Illness Pt is a 53 y.o. M with no known PMH who presents with right sided weakness, decreased sensation on right, diplopia and headache. CT showing remote L frontal infarcts, CTA head/neck with L P2 occlusion, MRI with acute L PCA infarct including much of L thalamus, L > R cerebellum, R pons. Now s/p complete revascularization of occluded L PCA with mechanical thrombectomy. ETT 8/27-8/28; self extubated 8/28.    PT Comments    Pt is distraught, tearful, refusing medication and refusing to eat.  He only got OOB with me because he was having abdominal cramping and wanted to get to the [facility] (which he ended up not being able to have a BM).  On site interpreter used and pt speaking [place] today vs [place] that he uses with the interpreter on other sessions.  The interpreter tells me he thinks that I speak his language (confusion).  He is tearful and asking, "why did this happen to me, I am a good man".   He is agreeable to have [place] check on him Monday and I encouraged him to let us take him outside for a mental break from the hospital.  He was agreeable.  PT will continue to follow acutely for safe mobility progression.  Follow Up Recommendations  SNF     Equipment Recommendations  3in1 (PT);Wheelchair (measurements PT);Wheelchair cushion (measurements PT)(hemi height 18x18, one arm drive WC)    Recommendations for Other Services   NA     Precautions / Restrictions Precautions Precautions: Fall Precaution Comments: R hemiparesis and inattention. Required Braces or Orthoses: Knee Immobilizer - Right(for ambulation attempts)    Mobility  Bed Mobility Overal bed mobility: Needs Assistance Bed Mobility: Supine to Sit Rolling: Min assist     Sit to supine: Min assist   General bed mobility comments: Pt able to assist at trunk to pull up to  sitting with bed rail, min assist to bring R LE over the side of the bed as he often forgets it.   Transfers Overall transfer level: Needs assistance Equipment used: None Transfers: Sit to/from [employer] Sit to Stand: Mod assist Stand pivot transfers: Mod assist       General transfer comment: Mod assist to stand and pivot to the [facility] as pt was having abdominal cramping and needed to have a BM, but could not, even with some perianal stimulation to attempt to help him go he was unable.  He refused for me to ask the RN for something for the cramping and constipation.    Modified Rankin (Stroke Patients Only) Modified Rankin (Stroke Patients Only) Pre-Morbid Rankin Score: No symptoms Modified Rankin: Moderately severe disability        Cognition Arousal/Alertness: Awake/alert Behavior During Therapy: Impulsive(labile) Overall Cognitive Status: Impaired/Different from baseline Area of Impairment: Safety/judgement;Awareness;Problem solving                 Orientation Level: Disoriented to;Person(he keeps thinking that I can speak [place]) Current Attention Level: Selective Memory: Decreased short-term memory Following Commands: Follows multi-step commands inconsistently Safety/Judgement: Decreased awareness of safety;Decreased awareness of deficits Awareness: Emergent Problem Solving: Difficulty sequencing;Requires verbal cues;Requires tactile cues General Comments: Interpreter present, reporting increased use of [place] today, tells me that he thinks I can speak [place] too, seems more confused, doesn't want to take any medicine and has stopped eating.  General Comments General comments (skin integrity, edema, etc.): Pt refused OOB mobility. He is refusing to eat or take any of his mediciation today.  He seems to be mourning the loss of his function, independence, and reports being lonely.  He is tearful throughout, frustrated most he reports by  his communication deficit.       Pertinent Vitals/Pain Pain Assessment: Faces Faces Pain Scale: Hurts whole lot Pain Location: abdomen Pain Descriptors / Indicators: Cramping Pain Intervention(s): Limited activity within patient's tolerance;Monitored during session;Repositioned           PT Goals (current goals can now be found in the care plan section) Acute Rehab PT Goals Patient Stated Goal: None stated Progress towards PT goals: Not progressing toward goals - comment(self limiting)    Frequency    Min 3X/week      PT Plan Current plan remains appropriate       AM-PAC PT "6 Clicks" Mobility   Outcome Measure  Help needed turning from your back to your side while in a flat bed without using bedrails?: A Little Help needed moving from lying on your back to sitting on the side of a flat bed without using bedrails?: A Little Help needed moving to and from a bed to a chair (including a wheelchair)?: A Lot Help needed standing up from a chair using your arms (e.g., wheelchair or bedside chair)?: A Lot Help needed to walk in hospital room?: Total Help needed climbing 3-5 steps with a railing? : Total 6 Click Score: 12    End of Session Equipment Utilized During Treatment: Gait belt Activity Tolerance: Patient limited by pain;Patient limited by fatigue Patient left: in bed;with bed alarm set;with call bell/phone within reach   PT Visit Diagnosis: Unsteadiness on feet (R26.81);Other symptoms and signs involving the nervous system (R29.898);Pain Hemiplegia - Right/Left: Right Hemiplegia - dominant/non-dominant: Non-dominant Hemiplegia - caused by: Cerebral infarction Pain - Right/Left: Right Pain - part of body: Leg     Time: 1300-1426(time deducted for emotional support) PT Time Calculation (min) (ACUTE ONLY): 86 min  Charges:  $Therapeutic Activity: 23-37 mins                    Ashlay Altieri B. Kace Hartje, PT, DPT  Acute Rehabilitation [phone] pager [phone] office  @ [facility]: [phone]   06/17/2019, 3:22 PM

## 2019-06-17 NOTE — Progress Notes (Signed)
STROKE TEAM PROGRESS NOTE   INTERVAL HISTORY No changes.Patient continues to refuse medications and food. And remains tearful and depressed.    . Case Mgt  Has been in touch with dtr who wants family to pick him up and take him to [place]. No new issues   otherwise. Neuro exam stable, no change.   Vitals:   06/16/19 2245 06/17/19 0100 06/17/19 0400 06/17/19 0752  BP: (!) 138/96 (!) 129/94 114/78 118/86  Pulse: 78 88 79 82  Resp: 18 18 16 16   Temp: 97.7 F (36.5 C) 98.6 F (37 C) 98.7 F (37.1 C) 98.7 F (37.1 C)  TempSrc: Oral Oral Oral Oral  SpO2: 100% 100% 99% 98%  Weight:      Height:        CBC:  Recent Labs  Lab 06/11/19 0508 06/15/19 0355  WBC 6.9 6.0  HGB 11.9* 12.8*  HCT 38.1* 40.4  MCV 72.8* 72.1*  PLT 333 712    Basic Metabolic Panel:  Recent Labs  Lab 06/11/19 0508 06/15/19 0355  NA 136 136  K 4.3 3.9  CL 100 101  CO2 24 25  GLUCOSE 107* 177*  BUN 10 14  CREATININE 0.77 0.83  CALCIUM 9.9 9.9    IMAGING last 24h No results found.   PHYSICAL EXAM  Temp:  [97.7 F (36.5 C)-98.7 F (37.1 C)] 98.7 F (37.1 C) (10/30 0752) Pulse Rate:  [78-88] 82 (10/30 0752) Resp:  [16-18] 16 (10/30 0752) BP: (114-138)/(78-96) 118/86 (10/30 0752) SpO2:  [98 %-100 %] 98 % (10/30 0752)        Frail middle aged african [place] male not in distress. Afebrile. Head is nontraumatic. Neck is supple without bruit. Cardiac exam no murmur or gallop. Lungs are clear to auscultation. Distal pulses are well felt. Neurological Exam: awake, alert, eyes open, following simple commands. She make paraphasic errors, but able to get some points across. Fluent aphasia, able to name 1/3 items. Initial dysconjugate gaze, left eye mild INO, right eye CN VI incomplete palsy, up and downward gaze palsy. PERRL. Not blinking to visual threat on the right. Rt hemianopia. Right facial droop, tongue midline, LUE 5/5 but significant ataxia. LLE 4-/5 at least. Decreased sensation on Right  side. Increased muscle tone on the RUE. Positive triple reflex on the right. gait not tested.  ASSESSMENT/PLAN Rodney Collier is a 53 y.o. male with no significant past medical history presenting with right-sided weakness, decreased sensation on the right, diplopia and headache. Complete revascularization of occluded L PCA w/ mechanical thrombectomy.  Stroke: embolic multifocal posterior circulation infarcts with left P2 occlusion s/p IR with TICI3 reperfusion - likely embolic   Code Stroke CT head No acute abnormality. Old infarct L frontal lobe.   CTA head & neck L P2 occlusion. Mild neck atherosclerosis . No LVO anterior circulation. Mild mid BA narrowing.  CT perfusion ischemia w/o visible core  CT Head - 04/16/19 - Areas of acute infarction are showing low-density and swelling but no macroscopic hemorrhage by CT.    Cerebral angio TICI3 reperfusion of occluded L PCA  MRI  L PCA (including thalamus), L>R cerebellum, R pontine and left midbrain infarct. Petechial hemorrhage seen in pons. Old L frontal infarcts.   LE Doppler no DVT - no evidence of deep vein thrombosis   2D Echo - EF 55-60%.  No cardiac source of emboli identified.   TCD bubble no HITS  TEE neg  UDS - positive for benzos - UDS done on  9/1  EEG - cortical dysfunction but no seizures.  LDL 88  HgbA1c 12.3  Lovenox 40 mg sq daily  for VTE prophylaxis  No antithrombotic prior to admission, finished DAPT 3-week course, now on aspirin alone   Therapy recommendations:  CIR -> D/t lack of post hospitalization family support - will pursue SNF - will need rapid covid testing prior to D/C  Disposition:  pending (SW trying to help [place] w/ [place] [facility] and [facility])  Medically ready for d/c when bed found  Patient remains stable  Possible seizure  Full body rigid shaking hours post IR, L>R  Loaded with keppra  EEG - cortical dysfunction but no seizures.  Tapered off keppra  10/23  Seizure precautions  Hypertension  No hx of HTN, on no home meds  Placed On cleviprex gtt for BP control post IR, now off . Discontinue amlodipine 10 . Now on lotensin to 10mg  bid  . BP stable . BP goal normotensive  Hyperlipidemia  Home meds:  No statin  LDL 88, goal < 70  HDL 39  On lipitor 20  Continue statin at discharge  Diabetes type II, new diagnosis, Uncontrolled Hyperglycemia, improving  HgbA1c 12.3, goal < 7.0  continue metformin 1000mg  bid   off glipizde to 5 mg bid d/t hypoglycemia  glipizide 2.5mg  QA C -> 2.5mg  bid -> hypoglycemia 10/6 -> back to 2.5 mg qac ->glucose lowish 10/18 -> glipizide discontinued  DM coordinator following - should glucoses remain elevated. May benefit from SGLT-2inhibitior if he gets insurance   SSI 0-15 scale, 0-5 qhs  CBG stable so far  Dysphagia Malnutrition  . Secondary to stroke . Speech on board . Encourage po intake . Off IVF, lab stable . On glucerna shakes tid . Now cleared for D3 thin liquids in cup only, meds whole w/ liquids in cup  UTI with fever, resolved  UA showed WBC > 50  Urine culture enterobactor and staph coag neg  Sensitive to cipro  Finished cipro 500mg  bid course (ended 9/8)  Fever resolved   Other Stroke Risk Factors   hx of stroke - small remote appearing infarcts in the left frontal lobe by CT and MRI  Other Active Problems  Microcytic anemia - iron 38, Ferritin 115 - put on iron tab bid. Hb 11.7 ->11.9 (stable; can monitor monthly)  Pt had 2-3 times slip off chair while sitting in chair. No fall, no injury. - now move to room 13 closer to nurse station.    Occasional low BP, asymptomatic   Hospital day # 23   Patient unfortunately is difficult to place and and is quite depressed and refusing medications and missing his family.  Daughter has decided to send family to pick him up to take him to as it has been quite frustrating in getting a nursing home  there to accept him.  Discussed with case manager and with the patient.  Plan psychiatry consult to help treat his depression and address competency. 06/17/2019 1:49 PM Desiree Metzger-Cihelka, ARNP-C, ANVP-BC Pager: [phone]   To contact Stroke Continuity provider, please refer to [place]. After hours, contact General Neurology

## 2019-06-17 NOTE — Progress Notes (Signed)
Patient did refuse all medications in this shift, this is not new, MD is awared, pt is stable, and will continue to monitor.

## 2019-06-17 NOTE — TOC Progression Note (Signed)
Transition of Care [facility]) - Progression Note    Patient Details  Name: Woodward Klem MRN: [id number] Date of Birth: June 02, 1966  Transition of Care [place]) CM/SW Contact  Pollie Friar, RN Phone Number: 06/17/2019, 1:38 PM  Clinical Narrative:    CM spoke to patients daughter: [name] over the phone. They family has decided to come and take the patient back to [place]. She is working with her Biomedical scientist on timing. She will reach out to CM or CSW with the date.  TOC following.    Expected Discharge Plan: [place] Barriers to Discharge: SNF Pending bed offer, Homeless with medical needs, Other (comment)(needs placement in [place])  Expected Discharge Plan and Services Expected Discharge Plan: [place] Choice: [place]                                         Social Determinants of Health (SDOH) Interventions    Readmission Risk Interventions No flowsheet data found.

## 2019-06-17 NOTE — Progress Notes (Signed)
This provider attempted to evaluate the patient but no interpreter available.  RN reported she left earlier and an appointment is needed.  Patient speaks [place].  University

## 2019-06-18 DIAGNOSIS — F321 Major depressive disorder, single episode, moderate: Secondary | ICD-10-CM | POA: Diagnosis not present

## 2019-06-18 LAB — COMPREHENSIVE METABOLIC PANEL
ALT: 21 U/L (ref 0–44)
AST: 17 U/L (ref 15–41)
Albumin: 3.4 g/dL — ABNORMAL LOW (ref 3.5–5.0)
Alkaline Phosphatase: 76 U/L (ref 38–126)
Anion gap: 18 — ABNORMAL HIGH (ref 5–15)
BUN: 14 mg/dL (ref 6–20)
CO2: 18 mmol/L — ABNORMAL LOW (ref 22–32)
Calcium: 10.1 mg/dL (ref 8.9–10.3)
Chloride: 101 mmol/L (ref 98–111)
Creatinine, Ser: 0.87 mg/dL (ref 0.61–1.24)
GFR calc Af Amer: >60 mL/min (ref >60)
GFR calc non Af Amer: >60 mL/min (ref >60)
Glucose, Bld: 239 mg/dL — ABNORMAL HIGH (ref 70–99)
Potassium: 4.4 mmol/L (ref 3.5–5.1)
Sodium: 137 mmol/L (ref 135–145)
Total Bilirubin: 0.8 mg/dL (ref 0.3–1.2)
Total Protein: 7.3 g/dL (ref 6.5–8.1)

## 2019-06-18 LAB — CBC
HCT: 41.2 % (ref 39.0–52.0)
Hemoglobin: 12.9 g/dL — ABNORMAL LOW (ref 13.0–17.0)
MCH: 22.7 pg — ABNORMAL LOW (ref 26.0–34.0)
MCHC: 31.3 g/dL (ref 30.0–36.0)
MCV: 72.4 fL — ABNORMAL LOW (ref 80.0–100.0)
Platelets: 288 10*3/uL (ref 150–400)
RBC: 5.69 MIL/uL (ref 4.22–5.81)
RDW: 14.5 % (ref 11.5–15.5)
WBC: 5.1 10*3/uL (ref 4.0–10.5)
nRBC: 0 % (ref 0.0–0.2)

## 2019-06-18 LAB — GLUCOSE, CAPILLARY
Glucose-Capillary: 244 mg/dL — ABNORMAL HIGH (ref 70–99)
Glucose-Capillary: 268 mg/dL — ABNORMAL HIGH (ref 70–99)
Glucose-Capillary: 212 mg/dL — ABNORMAL HIGH (ref 70–99)
Glucose-Capillary: 280 mg/dL — ABNORMAL HIGH (ref 70–99)

## 2019-06-18 MED ORDER — BUPROPION HCL ER (XL) 150 MG PO TB24
150.0000 mg | ORAL_TABLET | Freq: Every day | ORAL | Status: DC
  Filled 2019-06-18: qty 1

## 2019-06-18 MED ORDER — MIRTAZAPINE 15 MG PO TABS
15.0000 mg | ORAL_TABLET | Freq: Every day | ORAL | Status: DC
  Administered 2019-06-18: 15 mg via ORAL
  Filled 2019-06-18: qty 1

## 2019-06-18 NOTE — Progress Notes (Signed)
STROKE TEAM PROGRESS NOTE   INTERVAL HISTORY No family at the bedside.  Patient lying in bed, depressed mood, crying and stated that "I miss my family".  Psychiatry on board, switch BuSpar to Wellbutrin and and mirtazapine for depression.  Vitals:   06/17/19 1953 06/17/19 2336 06/18/19 0406 06/18/19 1131  BP: 125/90 132/89 129/84 127/78  Pulse: 88 86 77   Resp: 18 17 17 16   Temp: 98.8 F (37.1 C) 99 F (37.2 C) 98.5 F (36.9 C) 98.2 F (36.8 C)  TempSrc: Oral Oral Oral Oral  SpO2: 98% 100% 100% 100%  Weight:      Height:        CBC:  Recent Labs  Lab 06/15/19 0355 06/18/19 0459  WBC 6.0 5.1  HGB 12.8* 12.9*  HCT 40.4 41.2  MCV 72.1* 72.4*  PLT 297 288    Basic Metabolic Panel:  Recent Labs  Lab 06/15/19 0355 06/18/19 0459  NA 136 137  K 3.9 4.4  CL 101 101  CO2 25 18*  GLUCOSE 177* 239*  BUN 14 14  CREATININE 0.83 0.87  CALCIUM 9.9 10.1    IMAGING last 24h No results found.   PHYSICAL EXAM  Temp:  [97.9 F (36.6 C)-99 F (37.2 C)] 98.2 F (36.8 C) (10/31 1131) Pulse Rate:  [77-88] 77 (10/31 0406) Resp:  [16-18] 16 (10/31 1131) BP: (125-132)/(78-90) 127/78 (10/31 1131) SpO2:  [98 %-100 %] 100 % (10/31 1131)        Frail middle aged african 05-28-1974 male not in distress. Afebrile. Head is nontraumatic. Neck is supple without bruit. Cardiac exam no murmur or gallop. Lungs are clear to auscultation. Distal pulses are well felt. Neurological Exam: awake, alert, eyes open, following simple commands. She make paraphasic errors, but able to get some points across. Fluent aphasia, able to name 1/3 items. Initial dysconjugate gaze, left eye mild INO, right eye CN VI incomplete palsy, up and downward gaze palsy. PERRL. Not blinking to visual threat on the right. Rt hemianopia. Right facial droop, tongue midline, LUE 5/5 but significant ataxia. LLE 4-/5 at least. Decreased sensation on Right side. Increased muscle tone on the RUE. Positive triple reflex on the right.  gait not tested.  ASSESSMENT/PLAN Mr. Burwell Bethel is a 53 y.o. male with no significant past medical history presenting with right-sided weakness, decreased sensation on the right, diplopia and headache. Complete revascularization of occluded L PCA w/ mechanical thrombectomy.  Stroke: embolic multifocal posterior circulation infarcts with left P2 occlusion s/p IR with TICI3 reperfusion - likely embolic   Code Stroke CT head No acute abnormality. Old infarct L frontal lobe.   CTA head & neck L P2 occlusion. Mild neck atherosclerosis . No LVO anterior circulation. Mild mid BA narrowing.  CT perfusion ischemia w/o visible core  CT Head - 04/16/19 - Areas of acute infarction are showing low-density and swelling but no macroscopic hemorrhage by CT.    Cerebral angio TICI3 reperfusion of occluded L PCA  MRI  L PCA (including thalamus), L>R cerebellum, R pontine and left midbrain infarct. Petechial hemorrhage seen in pons. Old L frontal infarcts.   LE Doppler no DVT - no evidence of deep vein thrombosis   2D Echo - EF 55-60%.  No cardiac source of emboli identified.   TCD bubble no HITS  TEE neg  UDS - positive for benzos - UDS done on 9/1  EEG - cortical dysfunction but no seizures.  LDL 88  HgbA1c 12.3  Lovenox 40 mg  sq daily  for VTE prophylaxis  No antithrombotic prior to admission, finished DAPT 3-week course, now on aspirin alone   Therapy recommendations:  CIR -> D/t lack of post hospitalization family support - will pursue SNF - will need rapid covid testing prior to D/C  Disposition:  pending (SW trying to help family w/ placement in [place] [facility] and transfer back to [place])  Medically ready for d/c when bed found  Patient remains stable  Depression  Patient developed depressed mood and crying  Psychiatry on board  Switch BuSpar to Wellbutrin  Add mirtazapine  Possible seizure  Full body rigid shaking hours post IR, L>R  Loaded with keppra  EEG - cortical  dysfunction but no seizures.  Tapered off keppra 10/23  Seizure precautions  Hypertension  No hx of HTN, on no home meds  Placed On cleviprex gtt for BP control post IR, now off . Discontinue amlodipine 10 . Now on lotensin to 10mg  bid  . BP stable . BP goal normotensive  Hyperlipidemia  Home meds:  No statin  LDL 88, goal < 70  HDL 39  On lipitor 20  Continue statin at discharge  Diabetes type II, new diagnosis, Uncontrolled Hyperglycemia, improving  HgbA1c 12.3, goal < 7.0  continue metformin 1000mg  bid   off glipizde to 5 mg bid d/t hypoglycemia  glipizide 2.5mg  QA C -> 2.5mg  bid -> hypoglycemia 10/6 -> back to 2.5 mg qac ->glucose lowish 10/18 -> glipizide discontinued  DM coordinator re-consulted.   SSI 0-15 scale, 0-5 qhs  CBG still elevated  Dysphagia Malnutrition  . Secondary to stroke . Speech on board . Encourage po intake . Off IVF, lab stable . On glucerna shakes tid . Now cleared for D3 thin liquids in cup only, meds whole w/ liquids in cup  UTI with fever, resolved  UA showed WBC > 50  Urine culture enterobactor and staph coag neg  Sensitive to cipro  Finished cipro 500mg  bid course (ended 9/8)  Fever resolved   Other Stroke Risk Factors   hx of stroke - small remote appearing infarcts in the left frontal lobe by CT and MRI  Other Active Problems  Microcytic anemia - iron 38, Ferritin 115 - put on iron tab bid. Hb 11.7 ->11.9->12.9 (stable; can monitor monthly)  Pt had 2-3 times slip off chair while sitting in chair. No fall, no injury. - now move to room 13 closer to nurse station.    Occasional low BP, asymptomatic -> BP better  Hospital day # 62   Rosalin Hawking, MD PhD Stroke Neurology 06/18/2019 6:56 PM     To contact Stroke Continuity provider, please refer to [email or web address] After hours, contact General Neurology

## 2019-06-18 NOTE — Progress Notes (Signed)
OT Cancellation Note  Patient Details Name: Michale Emmerich MRN: [id number] DOB: 06-24-1966   Cancelled Treatment:    Reason Eval/Treat Not Completed: Patient declined, no reason specified;Other (comment).  Pt. Seen for attempted skilled OT treatment session.  Pt. States "no" to every treatment option offered to him.  Continued to shake head no and would not engage in any of the options or suggestions.  Refusing his breakfast tray or any beverages.  Assisted with plugging in his personal phone to charge and reviewed with him he had missed calls.  Encouraged him to call and speak with these people as it may make him feel better.  He nodded "yes" and gestured that he knew the people that were showing up as missed calls.  Selected one of the names and initiated the call.  No other questions or concerns. Noted. Encouraged to participate in therapy next attempt.   Janice Coffin, COTA/L 06/18/2019, 11:23 AM

## 2019-06-18 NOTE — Consult Note (Signed)
Telepsych Consultation   Reason for Consult:  ''Depression in a patient with Stroke who has been hospitalized for 64 days.'' Referring Physician:  Dr.Sethi Janalyn Shy Location of Patient: [facility] Location of Provider: [facility]  Patient Identification: Rodney Collier MRN:  [id number] Principal Diagnosis: Depression, major, single episode, moderate (HCC) Diagnosis:  Principal Problem:   Depression, major, single episode, moderate (HCC) Active Problems:   Acute ischemic stroke (HCC)   Embolism of posterior cerebral artery, left   History of ETT   Total Time spent with patient: 45 minutes  Subjective:   Rodney Collier is a 53 y.o. male patient admitted with stroke like symptoms.  HPI:  Patient who denies prior history of mental illness but reports that he was admitted due to stroke like symptoms. He partially cooperative with psychiatric evaluation. Per chart review, he was admitted in August, 2020 following which he was diagnosed with stroke. He has been hospitalized for over 2 months, now medically stable but it has been difficult to place patient in a skilled nursing facility. Apparently, social worker has been able to contact patient's family who is making arrangement to transfer patient to [place] where his family lives. 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 He is endorsing hopelessness, low energy level, anhedonia, feeling worthless but denies suicidal thoughts, psychosis and delusions. Also, patient reports that he has been refusing medications prescribed because ''they are making me feel sick''. However, he agreed to give an antidepressant a trial but with caveat that he will stop if it makes him sick.  Past Psychiatric History: none reported  Risk to Self:  denies Risk to Others:  denies Prior Inpatient Therapy:   Prior Outpatient Therapy:     Past Medical History: History reviewed. No pertinent past medical history.  Past Surgical History:  Procedure Laterality Date  . BUBBLE STUDY  04/19/2019   Procedure: BUBBLE STUDY;  Surgeon: Chilton Si, MD;  Location: [facility] ENDOSCOPY;  Service: Cardiovascular;;  . IR ANGIO EXTRACRAN SEL COM CAROTID INNOMINATE UNI R MOD SED  04/14/2019  . IR ANGIO INTRA EXTRACRAN SEL COM CAROTID INNOMINATE UNI L MOD SED  04/14/2019  . IR ANGIO VERTEBRAL SEL VERTEBRAL UNI L MOD SED  04/14/2019  . IR CT HEAD LTD  04/14/2019  . IR PERCUTANEOUS ART THROMBECTOMY/INFUSION INTRACRANIAL INC DIAG ANGIO  04/14/2019  . RADIOLOGY WITH ANESTHESIA N/A 04/14/2019   Procedure: IR WITH ANESTHESIA;  Surgeon: Radiologist, Medication, MD;  Location: [facility] OR;  Service: Radiology;  Laterality: N/A;  . TEE WITHOUT CARDIOVERSION N/A 04/19/2019   Procedure: TRANSESOPHAGEAL ECHOCARDIOGRAM (TEE);  Surgeon: Chilton Si, MD;  Location: [facility] ENDOSCOPY;  Service: Cardiovascular;  Laterality: N/A;   Family History:  Family History  Problem Relation Age of Onset  . Hypertension Mother   . Hypertension Father    Family Psychiatric  History: unknown Social History:  Social History   Substance and Sexual Activity  Alcohol Use None     Social History   Substance and Sexual Activity  Drug Use Not on file    Social History   Socioeconomic History  . Marital status: Legally Separated    Spouse name: Not on file  . Number of children: Not on file  . Years of education: Not on file  . Highest education level: Not on file  Occupational History  . Not on file  Social Needs  . Financial resource strain:  Not on file  . Food insecurity    Worry: Not on file    Inability: Not on file  . Transportation needs    Medical: Not on file    Non-medical: Not on file  Tobacco Use  . Smoking status: Never Smoker  . Smokeless tobacco: Never Used  Substance and Sexual Activity  . Alcohol use: Not on file  . Drug use: Not on file  .  Sexual activity: Not on file  Lifestyle  . Physical activity    Days per week: Not on file    Minutes per session: Not on file  . Stress: Not on file  Relationships  . Social Musician on phone: Not on file    Gets together: Not on file    Attends religious service: Not on file    Active member of club or organization: Not on file    Attends meetings of clubs or organizations: Not on file    Relationship status: Not on file  Other Topics Concern  . Not on file  Social History Narrative  . Not on file   Additional Social History:    Allergies:  No Known Allergies  Labs:  Results for orders placed or performed during the hospital encounter of 04/14/19 (from the past 48 hour(s))  Glucose, capillary     Status: Abnormal   Collection Time: 06/16/19  5:32 PM  Result Value Ref Range   Glucose-Capillary 214 (H) 70 - 99 mg/dL   Comment 1 Notify RN    Comment 2 Document in Chart   Glucose, capillary     Status: Abnormal   Collection Time: 06/16/19  9:26 PM  Result Value Ref Range   Glucose-Capillary 292 (H) 70 - 99 mg/dL   Comment 1 Notify RN    Comment 2 Document in Chart   Glucose, capillary     Status: Abnormal   Collection Time: 06/17/19  6:42 AM  Result Value Ref Range   Glucose-Capillary 222 (H) 70 - 99 mg/dL   Comment 1 Notify RN    Comment 2 Document in Chart   Glucose, capillary     Status: Abnormal   Collection Time: 06/17/19 12:30 PM  Result Value Ref Range   Glucose-Capillary 255 (H) 70 - 99 mg/dL  Glucose, capillary     Status: Abnormal   Collection Time: 06/17/19 12:32 PM  Result Value Ref Range   Glucose-Capillary 283 (H) 70 - 99 mg/dL  Glucose, capillary     Status: Abnormal   Collection Time: 06/17/19  5:07 PM  Result Value Ref Range   Glucose-Capillary 188 (H) 70 - 99 mg/dL  Glucose, capillary     Status: Abnormal   Collection Time: 06/17/19  9:19 PM  Result Value Ref Range   Glucose-Capillary 285 (H) 70 - 99 mg/dL   Comment 1 Notify RN     Comment 2 Document in Chart   CBC     Status: Abnormal   Collection Time: 06/18/19  4:59 AM  Result Value Ref Range   WBC 5.1 4.0 - 10.5 K/uL   RBC 5.69 4.22 - 5.81 MIL/uL   Hemoglobin 12.9 (L) 13.0 - 17.0 g/dL   HCT 16.1 09.6 - 04.5 %   MCV 72.4 (L) 80.0 - 100.0 fL   MCH 22.7 (L) 26.0 - 34.0 pg   MCHC 31.3 30.0 - 36.0 g/dL   RDW 40.9 81.1 - 91.4 %   Platelets 288 150 - 400 K/uL  nRBC 0.0 0.0 - 0.2 %    Comment: Performed at [facility], [address] [place], [place] [address]  Comprehensive metabolic panel     Status: Abnormal   Collection Time: 06/18/19  4:59 AM  Result Value Ref Range   Sodium 137 135 - 145 mmol/L   Potassium 4.4 3.5 - 5.1 mmol/L   Chloride 101 98 - 111 mmol/L   CO2 18 (L) 22 - 32 mmol/L   Glucose, Bld 239 (H) 70 - 99 mg/dL   BUN 14 6 - 20 mg/dL   Creatinine, Ser 8.110.87 0.61 - 1.24 mg/dL   Calcium 91.410.1 8.9 - 78.210.3 mg/dL   Total Protein 7.3 6.5 - 8.1 g/dL   Albumin 3.4 (L) 3.5 - 5.0 g/dL   AST 17 15 - 41 U/L   ALT 21 0 - 44 U/L   Alkaline Phosphatase 76 38 - 126 U/L   Total Bilirubin 0.8 0.3 - 1.2 mg/dL   GFR calc non Af Amer >60 >60 mL/min   GFR calc Af Amer >60 >60 mL/min   Anion gap 18 (H) 5 - 15    Comment: Performed at [facility], [address] [place], [place] [address]  Glucose, capillary     Status: Abnormal   Collection Time: 06/18/19  6:39 AM  Result Value Ref Range   Glucose-Capillary 244 (H) 70 - 99 mg/dL   Comment 1 Notify RN    Comment 2 Document in Chart   Glucose, capillary     Status: Abnormal   Collection Time: 06/18/19 11:32 AM  Result Value Ref Range   Glucose-Capillary 268 (H) 70 - 99 mg/dL   Comment 1 Notify RN    Comment 2 Document in Chart     Medications:  Current Facility-Administered Medications  Medication Dose Route Frequency Provider Last Rate Last Dose  . acetaminophen (TYLENOL) tablet 650 mg  650 mg Oral Q4H PRN Layne BentonBiby, Sharon L, NP   650 mg at 05/26/19 1033   Or  . acetaminophen (TYLENOL)  solution 650 mg  650 mg Per Tube Q4H PRN Layne BentonBiby, Sharon L, NP   650 mg at 04/15/19 2011   Or  . acetaminophen (TYLENOL) suppository 650 mg  650 mg Rectal Q4H PRN Layne BentonBiby, Sharon L, NP   650 mg at 04/18/19 0233  . aspirin EC tablet 81 mg  81 mg Oral Daily Layne BentonBiby, Sharon L, NP   81 mg at 06/11/19 [id number]  . atorvastatin (LIPITOR) tablet 20 mg  20 mg Oral q1800 Micki RileySethi, Pramod S, MD   20 mg at 06/11/19 1652  . benazepril (LOTENSIN) tablet 10 mg  10 mg Oral BID Marvel PlanXu, Jindong, MD   10 mg at 06/11/19 2202  . bethanechol (URECHOLINE) tablet 25 mg  25 mg Oral TID Chilton Siandolph, Tiffany, MD   Stopped at 06/15/19 2303  . [START ON 06/19/2019] buPROPion (WELLBUTRIN XL) 24 hr tablet 150 mg  150 mg Oral Daily Monnie Gudgel, MD      . enoxaparin (LOVENOX) injection 40 mg  40 mg Subcutaneous Q24H Micki RileySethi, Pramod S, MD   40 mg at 06/11/19 1138  . feeding supplement (GLUCERNA SHAKE) (GLUCERNA SHAKE) liquid 237 mL  237 mL Oral TID BM Micki RileySethi, Pramod S, MD   Stopped at 06/15/19 2306  . hydrALAZINE (APRESOLINE) injection 5-10 mg  5-10 mg Intravenous Q2H PRN Layne BentonBiby, Sharon L, NP   10 mg at 04/17/19 1825  . insulin aspart (novoLOG) injection 0-15 Units  0-15 Units Subcutaneous  TID WC Marvel Plan, MD   5 Units at 06/18/19 0700  . insulin aspart (novoLOG) injection 0-5 Units  0-5 Units Subcutaneous QHS Marvel Plan, MD   3 Units at 06/14/19 2228  . MEDLINE mouth rinse  15 mL Mouth Rinse q12n4p Biby, Sharon L, NP   15 mL at 06/18/19 1130  . metFORMIN (GLUCOPHAGE) tablet 1,000 mg  1,000 mg Oral BID WC Marvel Plan, MD   1,000 mg at 06/11/19 1652  . mirtazapine (REMERON) tablet 15 mg  15 mg Oral QHS Thedore Mins, MD      . Resource ThickenUp Clear   Oral PRN Chilton Si, MD      . senna-docusate (Senokot-S) tablet 1 tablet  1 tablet Oral QHS PRN Micki Riley, MD   1 tablet at 06/09/19 2030  . simethicone (MYLICON) chewable tablet 80 mg  80 mg Oral QID PRN Rinehuls, Kinnie Scales, PA-C        Musculoskeletal: Strength & Muscle Tone: not  tested Gait & Station: not tested Patient leans: N/A  Psychiatric Specialty Exam: Physical Exam  Psychiatric: Judgment and thought content normal. His speech is delayed. He is slowed and withdrawn. Cognition and memory are normal. He exhibits a depressed mood.    Review of Systems  Constitutional: Positive for malaise/fatigue.  HENT: Negative.   Respiratory: Negative.   Cardiovascular: Negative.   Gastrointestinal: Negative.   Genitourinary: Negative.   Skin: Negative.   Neurological: Positive for speech change and weakness.  Endo/Heme/Allergies: Negative.   Psychiatric/Behavioral: Positive for depression.    Blood pressure 127/78, pulse 77, temperature 98.2 F (36.8 C), temperature source Oral, resp. rate 16, height 6' (1.829 m), weight 77 kg, SpO2 100 %.Body mass index is 23.02 kg/m.  General Appearance: Casual  Eye Contact:  Good  Speech:  Slow  Volume:  Decreased  Mood:  Dysphoric  Affect:  Constricted  Thought Process:  Coherent  Orientation:  Full (Time, Place, and Person)  Thought Content:  Logical  Suicidal Thoughts:  No  Homicidal Thoughts:  No  Memory:  Immediate;   Fair Recent;   Fair Remote;   Fair  Judgement:  Other:  marginal  Insight:  Shallow  Psychomotor Activity:  Psychomotor Retardation  Concentration:  Concentration: Fair and Attention Span: Fair  Recall:  [employer] of Knowledge:  Fair  Language:  Fair  Akathisia:  No  Handed:  Right  AIMS (if indicated):     Assets:  Social Support  ADL's:  Impaired  Cognition:  WNL  Sleep:   fair     Treatment Plan Summary: 53 year old male who was admitted with stroke like symptoms and was eventually diagnosed with stroke. Patient now verbalizes multiple depressive symptoms but has been refusing to accept medications prescribed. Now, patient is willing to take medications and looking forward to his family taking him back to [place]. Today, he denies suicidal thoughts, psychosis and  delusions.  Recommendations: -Switch Bupropion to Wellbutrin XL 150 mg daily for depression. -Add Mirtazapine 15 mg po at bedtime for depression. -Encourage patient to comply with his medications. -Consider social worker consult to facilitate contact with patient's family and arrange eventual transfer to [place].  Disposition: No evidence of imminent risk to self or others at present.   Patient does not meet criteria for psychiatric inpatient admission. Supportive therapy provided about ongoing stressors. Psychiatric service signing out. Re-consult as needed  This service was provided via telemedicine using a 2-way, interactive audio and video technology.  Names of all persons participating in this telemedicine service and their role in this encounter. Name: Rodney Collier Role: Patient  Name: Avel Ogawa,MD Role: Psychiatrist    Corena Pilgrim, MD 06/18/2019 1:59 PM

## 2019-06-18 NOTE — Progress Notes (Signed)
Patient refusing to take medication; educated patient on medication compliance but patient still refused to take meds.

## 2019-06-19 LAB — GLUCOSE, CAPILLARY
Glucose-Capillary: 247 mg/dL — ABNORMAL HIGH (ref 70–99)
Glucose-Capillary: 274 mg/dL — ABNORMAL HIGH (ref 70–99)
Glucose-Capillary: 241 mg/dL — ABNORMAL HIGH (ref 70–99)

## 2019-06-19 NOTE — Progress Notes (Signed)
STROKE TEAM PROGRESS NOTE   INTERVAL HISTORY RN at bedside. Pt sitting in bed, initially not crying but during conversation he cried again. As per RN, pt refuse medication today. From interpretor phone, as per RN, pt does not like medication at all and now thinks all medication are not good for him. Had psychiatry consult yesterday and changed meds but pt has been refusing today.   Vitals:   06/18/19 2344 06/19/19 0340 06/19/19 0859 06/19/19 1149  BP: 140/84 113/87 132/86 110/74  Pulse: 83 78 93 95  Resp: 18 18 18 17   Temp: 98.4 F (36.9 C) 98.4 F (36.9 C) 98 F (36.7 C) 98.1 F (36.7 C)  TempSrc: Oral Oral Oral Oral  SpO2: 100% 100% 100% 100%  Weight:      Height:        CBC:  Recent Labs  Lab 06/15/19 0355 06/18/19 0459  WBC 6.0 5.1  HGB 12.8* 12.9*  HCT 40.4 41.2  MCV 72.1* 72.4*  PLT 297 237    Basic Metabolic Panel:  Recent Labs  Lab 06/15/19 0355 06/18/19 0459  NA 136 137  K 3.9 4.4  CL 101 101  CO2 25 18*  GLUCOSE 177* 239*  BUN 14 14  CREATININE 0.83 0.87  CALCIUM 9.9 10.1    IMAGING last 24h  No results found.   PHYSICAL EXAM  Temp:  [98 F (36.7 C)-98.4 F (36.9 C)] 98.1 F (36.7 C) (11/01 1149) Pulse Rate:  [78-95] 95 (11/01 1149) Resp:  [17-18] 17 (11/01 1149) BP: (110-141)/(74-104) 110/74 (11/01 1149) SpO2:  [100 %] 100 % (11/01 1149)        Frail middle aged african [place] male not in distress. Afebrile. Head is nontraumatic. Neck is supple without bruit. Cardiac exam no murmur or gallop. Lungs are clear to auscultation. Distal pulses are well felt. Neurological Exam: awake, alert, eyes open, following simple commands. She make paraphasic errors, but able to get some points across. Fluent aphasia, able to name 1/3 items. Initial dysconjugate gaze, left eye mild INO, right eye CN VI incomplete palsy, up and downward gaze palsy. PERRL. Not blinking to visual threat on the right. Rt hemianopia. Right facial droop, tongue midline, LUE 5/5  but significant ataxia. LLE 4-/5 at least. Decreased sensation on Right side. Increased muscle tone on the RUE. Positive triple reflex on the right. gait not tested.  ASSESSMENT/PLAN [name] with no significant past medical history presenting with right-sided weakness, decreased sensation on the right, diplopia and headache. Complete revascularization of occluded L PCA w/ mechanical thrombectomy.   Stroke: embolic multifocal posterior circulation infarcts with left P2 occlusion s/p IR with TICI3 reperfusion - likely embolic   Code Stroke CT head No acute abnormality. Old infarct L frontal lobe.   CTA head & neck L P2 occlusion. Mild neck atherosclerosis . No LVO anterior circulation. Mild mid BA narrowing.  CT perfusion ischemia w/o visible core  CT Head - 04/16/19 - Areas of acute infarction are showing low-density and swelling but no macroscopic hemorrhage by CT.    Cerebral angio TICI3 reperfusion of occluded L PCA  MRI  L PCA (including thalamus), L>R cerebellum, R pontine and left midbrain infarct. Petechial hemorrhage seen in pons. Old L frontal infarcts.   LE Doppler no DVT - no evidence of deep vein thrombosis   2D Echo - EF 55-60%.  No cardiac source of emboli identified.   TCD bubble no HITS  TEE neg  UDS - positive for benzos - UDS done on 9/1  EEG - cortical dysfunction but no seizures.  LDL 88  HgbA1c 12.3  Lovenox 40 mg sq daily  for VTE prophylaxis  No antithrombotic prior to admission, finished DAPT 3-week course, now on aspirin alone   Therapy recommendations:  CIR -> D/t lack of post hospitalization family support - will pursue SNF - will need rapid covid testing prior to D/C  Disposition:  pending (SW trying to help family w/ placement in [place] MCD and transfer back to [place])  Medically ready for d/c when bed found  Patient remains stable  Depression  Patient developed depressed mood and crying  Psychiatry on  board  Switch BuSpar to Wellbutrin  Add mirtazapine  Pt refusing meds today  Possible seizure  Full body rigid shaking hours post IR, L>R  Loaded with keppra  EEG - cortical dysfunction but no seizures.  Tapered off keppra 10/23  Seizure precautions  Hypertension  No hx of HTN, on no home meds  Placed On cleviprex gtt for BP control post IR, now off . Discontinue amlodipine 10 . Now on lotensin to 10mg  bid  . BP stable . BP goal normotensive  Hyperlipidemia  Home meds:  No statin  LDL 88, goal < 70  HDL 39  On lipitor 20 - pt refusing today  Continue statin at discharge  Diabetes type II, new diagnosis, Uncontrolled Hyperglycemia, improving  HgbA1c 12.3, goal < 7.0  continue metformin 1000mg  bid   off glipizde to 5 mg bid d/t hypoglycemia  glipizide 2.5mg  QA C -> 2.5mg  bid -> hypoglycemia 10/6 -> back to 2.5 mg qac ->glucose lowish 10/18 -> glipizide discontinued  SSI 0-15 scale, 0-5 qhs  CBG still elevated - due to pt refusing meds  Dysphagia Malnutrition  . Secondary to stroke . Speech on board . Encourage po intake . Off IVF, lab stable . On glucerna shakes tid . Now cleared for D3 thin liquids in cup only, meds whole w/ liquids in cup  UTI with fever, resolved  UA showed WBC > 50  Urine culture enterobactor and staph coag neg  Sensitive to cipro  Finished cipro 500mg  bid course (ended 9/8)  Fever resolved   Other Stroke Risk Factors   hx of stroke - small remote appearing infarcts in the left frontal lobe by CT and MRI  Other Active Problems  Microcytic anemia - iron 38, Ferritin 115 - put on iron tab bid. Hb 11.7 ->11.9->12.9 (stable; can monitor monthly)   Hospital day # 29   , MD PhD Stroke Neurology 06/19/2019 4:36 PM   To contact Stroke Continuity provider, please refer to 71. After hours, contact General Neurology

## 2019-06-19 NOTE — Progress Notes (Signed)
Inpatient Diabetes Program Recommendations  AACE/ADA: New Consensus Statement on Inpatient Glycemic Control (2015)  Target Ranges:  Prepandial:   less than 140 mg/dL      Peak postprandial:   less than 180 mg/dL (1-2 hours)      Critically ill patients:  140 - 180 mg/dL   Results for ELEUTERIO, DOLLAR (MRN [id number]) as of 06/19/2019 08:20  Ref. Range 06/18/2019 06:39 06/18/2019 11:32 06/18/2019 18:25 06/18/2019 21:28 06/19/2019 06:37  Glucose-Capillary Latest Ref Range: 70 - 99 mg/dL 244 (H) 268 (H) 212 (H) 280 (H) 247 (H)   Review of Glycemic Control  Diabetes history: DM2 Outpatient Diabetes medications: None Current orders for Inpatient glycemic control: Novolog 0-15 units TID with meals, Novolog 0-5 units QHS, Metformin 1000 mg BID  Inpatient Diabetes Program Recommendations:   Insulin- Noted patient is refusing insulin. Last received Novolog 5 units at 7:00 on 06/18/19.   Oral DM medications: Note patient is refusing Metformin as well as other oral medications. Continue to encourage patient to take medicaitons as prescribed. Please consider ordering Tradjenta 5 mg daily since it has low risk of hypoglycemia.  Thanks, Barnie Alderman, RN, MSN, CDE Diabetes Coordinator Inpatient Diabetes Program [phone] (Team Pager from 8am to 5pm)

## 2019-06-19 NOTE — Progress Notes (Signed)
Patient is still refusing to take medications, often getting hostile when offered. Per interpreter line, patient does not want to take medication because he believes none of them are making him any better. Information was relayed to Dr. Erlinda Hong at the bedside. Will continue to monitor.

## 2019-06-19 NOTE — Progress Notes (Signed)
Patient refusing medication and repositioning at this time; will continue to monitor.

## 2019-06-20 LAB — GLUCOSE, CAPILLARY
Glucose-Capillary: 184 mg/dL — ABNORMAL HIGH (ref 70–99)
Glucose-Capillary: 243 mg/dL — ABNORMAL HIGH (ref 70–99)
Glucose-Capillary: 218 mg/dL — ABNORMAL HIGH (ref 70–99)
Glucose-Capillary: 286 mg/dL — ABNORMAL HIGH (ref 70–99)
Glucose-Capillary: 245 mg/dL — ABNORMAL HIGH (ref 70–99)
Glucose-Capillary: 223 mg/dL — ABNORMAL HIGH (ref 70–99)

## 2019-06-20 MED ORDER — ADULT MULTIVITAMIN W/MINERALS CH
1.0000 | ORAL_TABLET | Freq: Every day | ORAL | Status: DC
  Filled 2019-06-20: qty 1

## 2019-06-20 MED ORDER — PRO-STAT SUGAR FREE PO LIQD
30.0000 mL | Freq: Three times a day (TID) | ORAL | Status: DC
  Filled 2019-06-20 (×4): qty 30

## 2019-06-20 NOTE — Progress Notes (Signed)
STROKE TEAM PROGRESS NOTE   INTERVAL HISTORY PT therapist at home but pt refused work with PT. As per RN, pt was calm until brother called in and then he was agitated and crying. I talked with pt brother Rodney Collier and sister in law Rodney Collier over the phone, they would like to come over end of this week to take pt home in [place]. Currently, pt ate about 50% of lunch and still refuse medications.    Vitals:   06/20/19 0009 06/20/19 0410 06/20/19 0814 06/20/19 1125  BP: 126/86 124/81 124/77 126/88  Pulse: 80 70 99 81  Resp: 16 16 16 14   Temp: 98.3 F (36.8 C) 98.1 F (36.7 C) 98.2 F (36.8 C) 97.8 F (36.6 C)  TempSrc: Oral  Oral Oral  SpO2: 100% 100% 100% 100%  Weight:      Height:        CBC:  Recent Labs  Lab 06/15/19 0355 06/18/19 0459  WBC 6.0 5.1  HGB 12.8* 12.9*  HCT 40.4 41.2  MCV 72.1* 72.4*  PLT 297 288    Basic Metabolic Panel:  Recent Labs  Lab 06/15/19 0355 06/18/19 0459  NA 136 137  K 3.9 4.4  CL 101 101  CO2 25 18*  GLUCOSE 177* 239*  BUN 14 14  CREATININE 0.83 0.87  CALCIUM 9.9 10.1    IMAGING last 24h No results found.   PHYSICAL EXAM  Temp:  [97.8 F (36.6 C)-98.3 F (36.8 C)] 97.8 F (36.6 C) (11/02 1125) Pulse Rate:  [70-99] 81 (11/02 1125) Resp:  [14-17] 14 (11/02 1125) BP: (110-137)/(74-88) 126/88 (11/02 1125) SpO2:  [99 %-100 %] 100 % (11/02 1125)         General - Well nourished, well developed, depressed mood with intermittent crying.  Ophthalmologic - fundi not visualized due to noncooperation.  Cardiovascular - Regular rhythm and rate.  Neuro - awake, alert, eyes open, following simple commands. Able to have sentences in his native language.  Incomplete left gaze bilaterally, right gaze complete, up and downward gaze palsy. PERRL. Not blinking to visual threat on the right. Rt hemianopia. Right facial droop, tongue midline, LUE 5/5 but significant ataxia. LLE 4/5 at least. On pain stimulation, slight withdraw of RLE but no movement of  RUE. Diminished tone on the right. Positive triple reflex on the right. Sensation decreased on the left, and gait not tested.  ASSESSMENT/PLAN [name] with no significant past medical history presenting with right-sided weakness, decreased sensation on the right, diplopia and headache. Complete revascularization of occluded L PCA w/ mechanical thrombectomy.   Stroke: embolic multifocal posterior circulation infarcts with left P2 occlusion s/p IR with TICI3 reperfusion - likely embolic   Code Stroke CT head No acute abnormality. Old infarct L frontal lobe.   CTA head & neck L P2 occlusion. Mild neck atherosclerosis . No LVO anterior circulation. Mild mid BA narrowing.  CT perfusion ischemia w/o visible core  CT Head - 04/16/19 - Areas of acute infarction are showing low-density and swelling but no macroscopic hemorrhage by CT.    Cerebral angio TICI3 reperfusion of occluded L PCA  MRI  L PCA (including thalamus), L>R cerebellum, R pontine and left midbrain infarct. Petechial hemorrhage seen in pons. Old L frontal infarcts.   LE Doppler no DVT - no evidence of deep vein thrombosis   2D Echo - EF 55-60%.  No cardiac source of emboli identified.   TCD bubble no HITS  TEE  neg  UDS - positive for benzos - UDS done on 9/1  EEG - cortical dysfunction but no seizures.  LDL 88  HgbA1c 12.3  Lovenox 40 mg sq daily  for VTE prophylaxis  No antithrombotic prior to admission, finished DAPT 3-week course, now on aspirin alone   Therapy recommendations:  CIR -> SNF-> home w/ family in [place]  Disposition:  pending (SW trying to help family w/ placement in [place], MCD application an issue as well as SNF COVID limitations there. Brother and sister in law will plan to bring him back to [place] hopefully later this week)  Medically ready for d/c when d/c plan confirmed   Depression  Patient developed depressed mood and intermittent crying  Psychiatry on board  Switch  BuSpar to Wellbutrin  Add mirtazapine  Pt continues to refuse meds  Possible seizure  Full body rigid shaking hours post IR, L>R  Loaded with keppra  EEG - cortical dysfunction but no seizures.  Tapered off keppra 10/23  Seizure precautions  Hypertension  No hx of HTN, on no home meds  Placed On cleviprex gtt for BP control post IR, now off . Discontinue amlodipine 10 . Now on lotensin to 10mg  bid  . BP stable . BP goal normotensive  Hyperlipidemia  Home meds:  No statin  LDL 88, goal < 70  HDL 39  On lipitor 20 - pt refusing meds  Continue statin at discharge  Diabetes type II, new diagnosis, Uncontrolled Hyperglycemia, improving  HgbA1c 12.3, goal < 7.0  continue metformin 1000mg  bid   off glipizde to 5 mg bid d/t hypoglycemia  glipizide 2.5mg  QA C -> 2.5mg  bid -> hypoglycemia 10/6 -> back to 2.5 mg qac ->glucose lowish 10/18 -> glipizide discontinued  SSI 0-15 scale, 0-5 qhs  hyperglycemia - due to pt refusing meds  Dysphagia Malnutrition  . Secondary to stroke . Speech on board . Encourage po intake . Off IVF, lab stable . On glucerna shakes tid . Now cleared for D3 thin liquids in cup only, meds whole w/ liquids in cup  UTI with fever, resolved  UA showed WBC > 50  Urine culture enterobactor and staph coag neg  Sensitive to cipro  Finished cipro 500mg  bid course (ended 9/8)  Fever resolved   Other Stroke Risk Factors   hx of stroke - small remote appearing infarcts in the left frontal lobe by CT and MRI  Other Active Problems  Microcytic anemia - iron 38, Ferritin 115 - put on iron tab bid. Hb 11.7 ->11.9->12.9 (stable; can monitor monthly)   Hospital day # 44   Rosalin Hawking, MD PhD Stroke Neurology 06/20/2019 11:31 AM   To contact Stroke Continuity provider, please refer to [email or web address] After hours, contact General Neurology

## 2019-06-20 NOTE — Plan of Care (Signed)
Patient continues to refuse all medications even after being educated on its importance.

## 2019-06-20 NOTE — Progress Notes (Signed)
  Speech Language Pathology Treatment: Cognitive-Linquistic  Patient Details Name: Rodney Collier MRN: [id number] DOB: [birth year] Today's Date: 06/20/2019 Time: 1100-1111 SLP Time Calculation (min) (ACUTE ONLY): 11 min  Assessment / Plan / Recommendation Clinical Impression  Skilled treatment session focused on cognitive linguistic goals. SLP facilitated session by providing yes/no with pt providing appropriate responses to orientation questions. SLP also facilitated by introducing simple card game of WAR with pt demonstrating good ability to name card and to indicate which card was higher. Pt required Mod A faded to Min A to problem solve which player received the highest card. Pt appeared to enjoy games. Pt left in bed with bed in it's lowest position and all needs within reach. Continue per current plan of care.    HPI HPI: Pt is a 53 y.o. M with no known PMH who presents with right sided weakness, decreased sensation on right, diplopia and headache. CT showing remote L frontal infarcts, CTA head/neck with L P2 occlusion, MRI with acute L PCA infarct including much of L thalamus, L > R cerebellum, R pons. Now s/p complete revascularization of occluded L PCA with mechanical thrombectomy. ETT 8/27-8/28; self extubated 8/28.      SLP Plan  Continue with current plan of care       Recommendations   N/A                Follow up Recommendations: Skilled Nursing facility;24 hour supervision/assistance SLP Visit Diagnosis: Aphasia (R47.01) Plan: Continue with current plan of care       [place] 06/20/2019, 11:56 AM

## 2019-06-20 NOTE — Progress Notes (Signed)
Physical Therapy Treatment Patient Details Name: Rodney Collier MRN: [id number] DOB: 1966-06-06 Today's Date: 06/20/2019    History of Present Illness Pt is a 54 y.o. M with no known PMH who presents with right sided weakness, decreased sensation on right, diplopia and headache. CT showing remote L frontal infarcts, CTA head/neck with L P2 occlusion, MRI with acute L PCA infarct including much of L thalamus, L > R cerebellum, R pons. Now s/p complete revascularization of occluded L PCA with mechanical thrombectomy. ETT 8/27-8/28; self extubated 8/28.    PT Comments    Assisted RN in bed change after pt had a large BM in the bed.  Pt agreeable to assist in bed repositioning and clean up, but refused OOB mobility to weigh pt or get his sheets changed.  He continues to say "no" to all things offered.  PT spent quite a bit of time (not charged/billed) attempting to plead with pt re: getting up and participating with therapy.  I offered to take him outside and listen to music and he refused.  He was positioned for comfort in the bed and blinds drawn per pt request.  PT will continue to follow acutely for safe mobility progression.  GOAL update due next session.    Follow Up Recommendations  SNF     Equipment Recommendations  3in1 (PT);Wheelchair (measurements PT);Wheelchair cushion (measurements PT);Other (comment)(18x18 hemi height one arm drive)    Recommendations for Other Services Rehab consult     Precautions / Restrictions Precautions Precautions: Fall Precaution Comments: R hemiparesis and inattention.    Mobility  Bed Mobility Overal bed mobility: Needs Assistance Bed Mobility: Rolling Rolling: Min assist         General bed mobility comments: Min assist to roll to the left, supervision to the right.  heavy reliance on railing to complete rolls for peri care, pt needs min assist to scoot up to [facility], but able to assist with L arm and left leg.    Transfers                 General transfer comment: Pt refused OOB transfer to [facility] (to try to have another BM, to scale (per RN request) or even to [facility] to go outside with PT.  He wants shades drawn and to remain in the bed.       Modified Rankin (Stroke Patients Only) Modified Rankin (Stroke Patients Only) Pre-Morbid Rankin Score: No symptoms Modified Rankin: Moderately severe disability        Cognition Arousal/Alertness: Awake/alert Behavior During Therapy: Impulsive;Flat affect Overall Cognitive Status: Impaired/Different from baseline Area of Impairment: Safety/judgement;Awareness;Problem solving                         Safety/Judgement: Decreased awareness of safety;Decreased awareness of deficits Awareness: Emergent Problem Solving: Difficulty sequencing;Requires verbal cues;Requires tactile cues General Comments: Pt continues to refuse services/help.  Was willing to roll to get cleaned up from BM.  Says he doesn't want to give up, but won't do anything to help himself get better (eat, take medicine, get up with therapy).               Pertinent Vitals/Pain Pain Assessment: Faces Faces Pain Scale: Hurts whole lot Pain Location: abdomen, bottom Pain Descriptors / Indicators: Cramping;Grimacing;Guarding Pain Intervention(s): Limited activity within patient's tolerance;Monitored during session;Repositioned           PT Goals (current goals can now be found in the care plan section) Acute  Rehab PT Goals Patient Stated Goal: he does not want to get up, does not want help Progress towards PT goals: Not progressing toward goals - comment(pt is very self limiting)    Frequency    Min 3X/week      PT Plan Current plan remains appropriate       AM-PAC PT "6 Clicks" Mobility   Outcome Measure  Help needed turning from your back to your side while in a flat bed without using bedrails?: A Little Help needed moving from lying on your back to sitting on the side of a flat bed  without using bedrails?: A Little Help needed moving to and from a bed to a chair (including a wheelchair)?: A Lot Help needed standing up from a chair using your arms (e.g., wheelchair or bedside chair)?: A Lot Help needed to walk in hospital room?: Total Help needed climbing 3-5 steps with a railing? : Total 6 Click Score: 12    End of Session   Activity Tolerance: Other (comment)(pt self limiting) Patient left: in bed;with call bell/phone within reach;with bed alarm set Nurse Communication: Mobility status PT Visit Diagnosis: Unsteadiness on feet (R26.81);Other symptoms and signs involving the nervous system (R29.898);Pain Hemiplegia - Right/Left: Right Hemiplegia - dominant/non-dominant: Non-dominant Hemiplegia - caused by: Cerebral infarction Pain - Right/Left: Right Pain - part of body: Leg     Time: 1300-1337(no charge for time spent attempting to convince pt to get up) PT Time Calculation (min) (ACUTE ONLY): 37 min  Charges:  $Therapeutic Activity: 8-22 mins                    Verdene Lennert, PT, DPT  Acute Rehabilitation [phone] pager #[phone] office  @ [facility]: [phone]   06/20/2019, 2:39 PM

## 2019-06-20 NOTE — Progress Notes (Signed)
Nutrition Follow-up  DOCUMENTATION CODES:   Not applicable  INTERVENTION:  - will d/c Glucerna Shake. - will order 30 ml prostat TID, each supplement provides 100 kcal and 15 grams protein.  - will order daily multivitamin with minerals.  - weigh patient today.    NUTRITION DIAGNOSIS:   Increased nutrient needs related to acute illness as evidenced by estimated needs. -revised  GOAL:   Patient will meet greater than or equal to 90% of their needs -minimally met  MONITOR:   PO intake, Supplement acceptance, Labs, Weight trends  ASSESSMENT:   Rodney Collier is a 53 y.o. male with no significant past medical history presenting with right-sided weakness, decreased sensation on the right, diplopia and headache. Complete revascularization of occluded L PCA w/ mechanical thrombectomy.  Patient has not been weighed since 9/1. Patient is [place] speaking and requires an Veterinary surgeon. Did not speak with patient directly today. Per flow sheet documentation, he consumed 75% of lunch and dinner on 10/31 (total of 1056 kcal, 50 grams protein); 50% of breakfast, 75% of lunch, and 0% of dinner on 11/1 (total of 857 kcal, 41 grams protein).  Glucerna Shake ordered TID and patient has refused nearly all bottles since RD assessment on 10/26.   Patient pending SNF placement.     Labs reviewed; CBGs on 11/: 247, 274, and 241 mg/dl Medications reviewed; sliding scale novolog, 1000 mg metformin BID.     Diet Order:   Diet Order            DIET DYS 3 Room service appropriate? Yes; Fluid consistency: Thin  Diet effective now              EDUCATION NEEDS:   Not appropriate for education at this time  Skin:  Skin Assessment: Reviewed RN Assessment Skin Integrity Issues:: Other (Comment) Other: N/A  Last BM:  10/29  Height:   Ht Readings from Last 1 Encounters:  04/19/19 6' (1.829 m)    Weight:   Wt Readings from Last 1 Encounters:  04/19/19 77 kg    Ideal Body Weight:   80.9 kg  BMI:  Body mass index is 23.02 kg/m.  Estimated Nutritional Needs:   Kcal:  2000-2200  Protein:  100-115 grams  Fluid:  > 2.0 L     Jarome Matin, MS, RD, LDN, [facility] Inpatient Clinical Dietitian Pager # [phone] After hours/weekend pager # [phone]

## 2019-06-21 LAB — GLUCOSE, CAPILLARY
Glucose-Capillary: 195 mg/dL — ABNORMAL HIGH (ref 70–99)
Glucose-Capillary: 296 mg/dL — ABNORMAL HIGH (ref 70–99)
Glucose-Capillary: 315 mg/dL — ABNORMAL HIGH (ref 70–99)
Glucose-Capillary: 376 mg/dL — ABNORMAL HIGH (ref 70–99)

## 2019-06-21 NOTE — Plan of Care (Signed)
  Problem: Education: Goal: Knowledge of General Education information will improve Description: Including pain rating scale, medication(s)/side effects and non-pharmacologic comfort measures Outcome: Progressing   Problem: Health Behavior/Discharge Planning: Goal: Ability to manage health-related needs will improve Outcome: Progressing   Problem: Education: Goal: Knowledge of disease or condition will improve Outcome: Progressing Goal: Knowledge of secondary prevention will improve Outcome: Progressing Goal: Individualized Educational Video(s) Outcome: Progressing   Problem: Education: Goal: Expressions of having a comfortable level of knowledge regarding the disease process will increase Outcome: Progressing   Problem: Coping: Goal: Ability to adjust to condition or change in health will improve Outcome: Progressing Goal: Ability to identify appropriate support needs will improve Outcome: Progressing   Problem: Health Behavior/Discharge Planning: Goal: Compliance with prescribed medication regimen will improve Outcome: Progressing   Problem: Medication: Goal: Risk for medication side effects will decrease Outcome: Progressing   Problem: Clinical Measurements: Goal: Complications related to the disease process, condition or treatment will be avoided or minimized Outcome: Progressing Goal: Diagnostic test results will improve Outcome: Progressing   Problem: Safety: Goal: Verbalization of understanding the information provided will improve Outcome: Progressing   Problem: Self-Concept: Goal: Level of anxiety will decrease Outcome: Progressing Goal: Ability to verbalize feelings about condition will improve Outcome: Progressing   Adaora Mchaney, BSN, RN 

## 2019-06-21 NOTE — Progress Notes (Signed)
STROKE TEAM PROGRESS NOTE   INTERVAL HISTORY Pt lying in bed, calm and not agitated or crying. As per RN, pt ate well today for breakfast and lunch, however, still refuse meds, insulin or PT/OT. RLE 1/5 now.    Vitals:   06/20/19 2127 06/20/19 2347 06/21/19 0356 06/21/19 0814  BP: (!) 133/94 137/88 125/84 124/86  Pulse: 69 87 97 97  Resp: 18 18 18 20   Temp: 97.7 F (36.5 C) (!) 97.4 F (36.3 C) 97.8 F (36.6 C) 98.5 F (36.9 C)  TempSrc: Oral Oral Oral Oral  SpO2: 100% 100% 99% 97%  Weight:      Height:        CBC:  Recent Labs  Lab 06/15/19 0355 06/18/19 0459  WBC 6.0 5.1  HGB 12.8* 12.9*  HCT 40.4 41.2  MCV 72.1* 72.4*  PLT 297 288    Basic Metabolic Panel:  Recent Labs  Lab 06/15/19 0355 06/18/19 0459  NA 136 137  K 3.9 4.4  CL 101 101  CO2 25 18*  GLUCOSE 177* 239*  BUN 14 14  CREATININE 0.83 0.87  CALCIUM 9.9 10.1    IMAGING last 24h No results found.   PHYSICAL EXAM   Temp:  [97.4 F (36.3 C)-98.5 F (36.9 C)] 98.5 F (36.9 C) (11/03 0814) Pulse Rate:  [69-97] 97 (11/03 0814) Resp:  [14-20] 20 (11/03 0814) BP: (124-137)/(84-94) 124/86 (11/03 0814) SpO2:  [97 %-100 %] 97 % (11/03 0814)         General - Well nourished, well developed, depressed mood with intermittent crying.  Ophthalmologic - fundi not visualized due to noncooperation.  Cardiovascular - Regular rhythm and rate.  Neuro - awake, alert, eyes open, following simple commands. Able to have sentences in his native language.  Incomplete left gaze bilaterally, right gaze complete, up and downward gaze palsy. PERRL. Not blinking to visual threat on the right. Rt hemianopia. Right facial droop, tongue midline, LUE 5/5 but significant ataxia. LLE 4/5 at least. RLE 1/5 but no movement of RUE. Diminished tone on the right LE but increased muscle tone at RUE. Positive triple reflex on the right. Sensation decreased on the left, and gait not tested.  ASSESSMENT/PLAN Mr. Shandy Vi is  a 53 y.o. male with no significant past medical history presenting with right-sided weakness, decreased sensation on the right, diplopia and headache. Complete revascularization of occluded L PCA w/ mechanical thrombectomy.   Stroke: embolic multifocal posterior circulation infarcts with left P2 occlusion s/p IR with TICI3 reperfusion - likely embolic   Code Stroke CT head No acute abnormality. Old infarct L frontal lobe.   CTA head & neck L P2 occlusion. Mild neck atherosclerosis . No LVO anterior circulation. Mild mid BA narrowing.  CT perfusion ischemia w/o visible core  CT Head - 04/16/19 - Areas of acute infarction are showing low-density and swelling but no macroscopic hemorrhage by CT.    Cerebral angio TICI3 reperfusion of occluded L PCA  MRI  L PCA (including thalamus), L>R cerebellum, R pontine and left midbrain infarct. Petechial hemorrhage seen in pons. Old L frontal infarcts.   LE Doppler no DVT - no evidence of deep vein thrombosis   2D Echo - EF 55-60%.  No cardiac source of emboli identified.   TCD bubble no HITS  TEE neg  UDS - positive for benzos - UDS done on 9/1  EEG - cortical dysfunction but no seizures.  LDL 88  HgbA1c 12.3  Lovenox 40 mg sq daily  for VTE prophylaxis  No antithrombotic prior to admission, finished DAPT 3-week course, now on aspirin alone   Therapy recommendations:  CIR -> SNF-> home w/ family in [place]  Disposition:  pending (SW trying to help family w/ placement in [place], MCD application an issue as well as SNF COVID limitations there. Brother and sister in law plan to bring him back to [place] hopefully later this week)  Medically ready for d/c when d/c plan confirmed   Depression  Patient developed depressed mood and intermittent crying  Psychiatry on board  Switch BuSpar to Wellbutrin  Add mirtazapine  Pt continues to refuse meds  Possible seizure  Full body rigid shaking hours post IR, L>R  Loaded with keppra  EEG -  cortical dysfunction but no seizures.  Tapered off keppra 10/23  Seizure precautions  Hypertension  No hx of HTN, on no home meds  Placed On cleviprex gtt for BP control post IR, now off . Discontinue amlodipine 10 . Now on lotensin to 10mg  bid  . BP stable . BP goal normotensive  Hyperlipidemia  Home meds:  No statin  LDL 88, goal < 70  HDL 39  On lipitor 20 - pt refusing meds  Continue statin at discharge  Diabetes type II, new diagnosis, Uncontrolled Hyperglycemia   HgbA1c 12.3, goal < 7.0  continue metformin 1000mg  bid   off glipizde 5 mg bid d/t hypoglycemia  glipizide 2.5mg  QAC -> 2.5mg  bid -> hypoglycemia 10/6 -> back to 2.5 mg qac ->glucose lowish 10/18 -> glipizide discontinued  SSI 0-15 scale, 0-5 qhs  hyperglycemia - due to pt refusing meds  Dysphagia Malnutrition  . Secondary to stroke . Speech on board . Encourage po intake . Off IVF, lab stable . On glucerna shakes tid . Now cleared for D3 thin liquids in cup only, meds whole w/ liquids in cup  UTI with fever, resolved  UA showed WBC > 50  Urine culture enterobactor and staph coag neg  Sensitive to cipro  Finished cipro 500mg  bid course (ended 9/8)  Fever resolved   Other Stroke Risk Factors   hx of stroke - small remote appearing infarcts in the left frontal lobe by CT and MRI  Other Active Problems  Microcytic anemia - iron 38, Ferritin 115 - put on iron tab bid. Hb 11.7 ->11.9->12.9 (stable; can monitor monthly)  Hospital day # 44   Rosalin Hawking, MD PhD Stroke Neurology 06/21/2019 11:07 AM   To contact Stroke Continuity provider, please refer to [email or web address] After hours, contact General Neurology

## 2019-06-21 NOTE — Progress Notes (Signed)
Dr. Cheral Marker informed of pt BG 376 and pt refusing intervention.

## 2019-06-21 NOTE — Progress Notes (Signed)
Patient continues to refuse all medications this morning. Pt educated on purpose, and consents to physical assessments, but refuses any medication.

## 2019-06-21 NOTE — Plan of Care (Signed)
  Problem: Education: Goal: Knowledge of General Education information will improve Description: Including pain rating scale, medication(s)/side effects and non-pharmacologic comfort measures Outcome: Progressing   Problem: Health Behavior/Discharge Planning: Goal: Ability to manage health-related needs will improve Outcome: Progressing   Problem: Education: Goal: Knowledge of disease or condition will improve Outcome: Progressing Goal: Knowledge of secondary prevention will improve Outcome: Progressing Goal: Individualized Educational Video(s) Outcome: Progressing   Problem: Education: Goal: Expressions of having a comfortable level of knowledge regarding the disease process will increase Outcome: Progressing   Problem: Coping: Goal: Ability to adjust to condition or change in health will improve Outcome: Progressing Goal: Ability to identify appropriate support needs will improve Outcome: Progressing   Problem: Health Behavior/Discharge Planning: Goal: Compliance with prescribed medication regimen will improve Outcome: Progressing   Problem: Medication: Goal: Risk for medication side effects will decrease Outcome: Progressing   Problem: Clinical Measurements: Goal: Complications related to the disease process, condition or treatment will be avoided or minimized Outcome: Progressing Goal: Diagnostic test results will improve Outcome: Progressing   Problem: Safety: Goal: Verbalization of understanding the information provided will improve Outcome: Progressing   Problem: Self-Concept: Goal: Level of anxiety will decrease Outcome: Progressing Goal: Ability to verbalize feelings about condition will improve Outcome: Progressing   Ival Bible, BSN, RN

## 2019-06-22 LAB — GLUCOSE, CAPILLARY
Glucose-Capillary: 267 mg/dL — ABNORMAL HIGH (ref 70–99)
Glucose-Capillary: 370 mg/dL — ABNORMAL HIGH (ref 70–99)
Glucose-Capillary: 136 mg/dL — ABNORMAL HIGH (ref 70–99)
Glucose-Capillary: 167 mg/dL — ABNORMAL HIGH (ref 70–99)

## 2019-06-22 NOTE — Progress Notes (Signed)
Pt CBG 370. Discussed with NT and RN effects of hyperglycemia. Pt expressed concern that the insulin would make him sick, however he understands that his sugar is very elevated. He agrees to take subq insulin at this time. 15 units novolog given as ordered per sliding scale. MD Erlinda Hong notified. Pt does not want to take other medications at this time. Interpreter schedule for 1pm today.

## 2019-06-22 NOTE — Plan of Care (Signed)
  Problem: Education: Goal: Knowledge of General Education information will improve Description: Including pain rating scale, medication(s)/side effects and non-pharmacologic comfort measures Outcome: Progressing   Problem: Health Behavior/Discharge Planning: Goal: Ability to manage health-related needs will improve Outcome: Progressing   Problem: Education: Goal: Knowledge of disease or condition will improve Outcome: Progressing Goal: Knowledge of secondary prevention will improve Outcome: Progressing Goal: Individualized Educational Video(s) Outcome: Progressing   Problem: Education: Goal: Expressions of having a comfortable level of knowledge regarding the disease process will increase Outcome: Progressing   Problem: Coping: Goal: Ability to adjust to condition or change in health will improve Outcome: Progressing Goal: Ability to identify appropriate support needs will improve Outcome: Progressing   Problem: Health Behavior/Discharge Planning: Goal: Compliance with prescribed medication regimen will improve Outcome: Progressing   Problem: Medication: Goal: Risk for medication side effects will decrease Outcome: Progressing   Problem: Clinical Measurements: Goal: Complications related to the disease process, condition or treatment will be avoided or minimized Outcome: Progressing Goal: Diagnostic test results will improve Outcome: Progressing   Problem: Safety: Goal: Verbalization of understanding the information provided will improve Outcome: Progressing   Problem: Self-Concept: Goal: Level of anxiety will decrease Outcome: Progressing Goal: Ability to verbalize feelings about condition will improve Outcome: Progressing   Agostino Gorin, BSN, RN 

## 2019-06-22 NOTE — Progress Notes (Signed)
While discussing the plan of care with the patient this morning he stated "maybe" he would take his medications. He appears to be asking questions in french, when previously he had been speaking in [place]. I have called the interpreter line to schedule an interpreter to be at beside to discuss his medications.

## 2019-06-22 NOTE — Progress Notes (Signed)
  Speech Language Pathology Treatment: Cognitive-Linquistic;Dysphagia  Patient Details Name: Rodney Collier MRN: [id number] DOB: 28-Jul-1966 Today's Date: 06/22/2019 Time: 1040-1056 SLP Time Calculation (min) (ACUTE ONLY): 16 min  Assessment / Plan / Recommendation Clinical Impression  Pt was encountered awake/alert sitting upright in bed and he was agreeable to ST tx session.  Treatment focused on dysphagia and cognitive-linguistic deficits.  Pt was observed with trials of thin liquid via cup sip and he exhibited timely AP transport and consistent positive hyloaryngeal elevation/excursion via observation,  No clinical s/sx of aspiration were observed.  He politely refused all solid trials on this date.  Pt answered biographical, location based, and complex abstract yes/no questions with 100% accuracy independently given extra time.  He additionally answered biographical questions about himself; however, he frequently code switched between [place] and [place] during his responses.  Speech was observed to be fluent without halting or obvious anomia.  Pt was oriented x3 to self, place, and situation.  Pt was unable to recall how to use his call bell; therefore he was re-educated and he verbalized understanding with teach back.  Recommend additional skilled ST and assistance with IADLs (medications, finances, etc.) at time of discharge.  SLP will continue to follow for treatment targeting dysphagia and cognitive-linguistic deficits.     HPI HPI: Pt is a 53 y.o. M with no known PMH who presents with right sided weakness, decreased sensation on right, diplopia and headache. CT showing remote L frontal infarcts, CTA head/neck with L P2 occlusion, MRI with acute L PCA infarct including much of L thalamus, L > R cerebellum, R pons. Now s/p complete revascularization of occluded L PCA with mechanical thrombectomy. ETT 8/27-8/28; self extubated 8/28.      SLP Plan  Continue with current plan of care        Recommendations  Diet recommendations: Dysphagia 3 (mechanical soft);Thin liquid Liquids provided via: Cup;No straw Medication Administration: Whole meds with liquid Supervision: Patient able to self feed;Full supervision/cueing for compensatory strategies Compensations: Minimize environmental distractions;Small sips/bites;Slow rate;Lingual sweep for clearance of pocketing Postural Changes and/or Swallow Maneuvers: Seated upright 90 degrees;Upright 30-60 min after meal                Oral Care Recommendations: Oral care BID Follow up Recommendations: Skilled Nursing facility;24 hour supervision/assistance SLP Visit Diagnosis: Aphasia (R47.01) Plan: Continue with current plan of care       Colin Mulders M.S., [place] Office: [phone]               [place] 06/22/2019, 11:09 AM

## 2019-06-22 NOTE — Progress Notes (Signed)
STROKE TEAM PROGRESS NOTE   INTERVAL HISTORY Pt hyperglycemia and took insulin SSI, but still refuse medication. As per interpretor, pt does not want medication, all medication caused him weak and not feeling good. He ate meals OK.   Vitals:   06/21/19 2345 06/22/19 0355 06/22/19 0726 06/22/19 0907  BP: (!) 135/93 125/88 122/84 116/81  Pulse: 78 80 76 98  Resp: 14 14 14 16   Temp: 98 F (36.7 C) 98.6 F (37 C) 98 F (36.7 C) 98.6 F (37 C)  TempSrc: Oral Oral Oral Oral  SpO2: 100% 100% 100% 100%  Weight:      Height:        CBC:  Recent Labs  Lab 06/18/19 0459  WBC 5.1  HGB 12.9*  HCT 41.2  MCV 72.4*  PLT 151    Basic Metabolic Panel:  Recent Labs  Lab 06/18/19 0459  NA 137  K 4.4  CL 101  CO2 18*  GLUCOSE 239*  BUN 14  CREATININE 0.87  CALCIUM 10.1    IMAGING last 24h No results found.   PHYSICAL EXAM  Temp:  [98 F (36.7 C)-99.4 F (37.4 C)] 98.6 F (37 C) (11/04 0907) Pulse Rate:  [76-98] 98 (11/04 0907) Resp:  [14-16] 16 (11/04 0907) BP: (116-135)/(77-93) 116/81 (11/04 0907) SpO2:  [100 %] 100 % (11/04 0907)         General - Well nourished, well developed, depressed mood with intermittent crying.  Ophthalmologic - fundi not visualized due to noncooperation.  Cardiovascular - Regular rhythm and rate.  Neuro - awake, alert, eyes open, following simple commands. Able to have sentences in his native language.  Incomplete left gaze bilaterally, right gaze complete, up and downward gaze palsy. PERRL. Not blinking to visual threat on the right. Rt hemianopia. Right facial droop, tongue midline, LUE 5/5 but significant ataxia. LLE 4/5 at least. RLE 1/5 but no movement of RUE. Diminished tone on the right LE but increased muscle tone at RUE. Positive triple reflex on the right. Sensation decreased on the left, and gait not tested.  ASSESSMENT/PLAN [name] with no significant past medical history presenting with right-sided  weakness, decreased sensation on the right, diplopia and headache. Complete revascularization of occluded L PCA w/ mechanical thrombectomy.   Stroke: embolic multifocal posterior circulation infarcts with left P2 occlusion s/p IR with TICI3 reperfusion - likely embolic   Code Stroke CT head No acute abnormality. Old infarct L frontal lobe.   CTA head & neck L P2 occlusion. Mild neck atherosclerosis . No LVO anterior circulation. Mild mid BA narrowing.  CT perfusion ischemia w/o visible core  CT Head - 04/16/19 - Areas of acute infarction are showing low-density and swelling but no macroscopic hemorrhage by CT.    Cerebral angio TICI3 reperfusion of occluded L PCA  MRI  L PCA (including thalamus), L>R cerebellum, R pontine and left midbrain infarct. Petechial hemorrhage seen in pons. Old L frontal infarcts.   LE Doppler no DVT - no evidence of deep vein thrombosis   2D Echo - EF 55-60%.  No cardiac source of emboli identified.   TCD bubble no HITS  TEE neg  UDS - positive for benzos - UDS done on 9/1  EEG - cortical dysfunction but no seizures.  LDL 88  HgbA1c 12.3  Lovenox 40 mg sq daily  for VTE prophylaxis  No antithrombotic prior to admission, finished DAPT 3-week course, now on aspirin alone - refusing now  Therapy recommendations:  CIR -> SNF-> home w/ family in [place]  Disposition:  pending (SW trying to help family w/ placement in [place], MCD application an issue as well as SNF COVID limitations there. Brother and sister in law plan to bring him back to [place] hopefully later this week)  Medically ready for d/c when d/c plan confirmed   Depression  Patient developed depressed mood and intermittent crying  Psychiatry on board  Switch BuSpar to Wellbutrin  Add mirtazapine  Pt continues to refuse meds  Possible seizure  Full body rigid shaking hours post IR, L>R  Loaded with keppra  EEG - cortical dysfunction but no seizures.  Tapered off keppra  10/23  Seizure precautions  Hypertension  No hx of HTN, on no home meds  Placed On cleviprex gtt for BP control post IR, now off . Discontinue amlodipine 10 . Now on lotensin to 10mg  bid  . BP stable . BP goal normotensive  Hyperlipidemia  Home meds:  No statin  LDL 88, goal < 70  HDL 39  On lipitor 20 - pt refusing meds  Continue statin at discharge  Diabetes type II, new diagnosis, Uncontrolled Hyperglycemia   HgbA1c 12.3, goal < 7.0  continue metformin 1000mg  bid   off glipizde 5 mg bid d/t hypoglycemia  glipizide 2.5mg  QAC -> 2.5mg  bid -> hypoglycemia 10/6 -> back to 2.5 mg qac ->glucose lowish 10/18 -> glipizide discontinued  SSI 0-15 scale, 0-5 qhs  hyperglycemia - due to pt refusing meds  Dysphagia Malnutrition  . Secondary to stroke . Speech on board . Encourage po intake . Off IVF, lab stable . On glucerna shakes tid . Now cleared for D3 thin liquids in cup only, meds whole w/ liquids in cup  UTI with fever, resolved  UA showed WBC > 50  Urine culture enterobactor and staph coag neg  Sensitive to cipro  Finished cipro 500mg  bid course (ended 9/8)  Fever resolved   Other Stroke Risk Factors   hx of stroke - small remote appearing infarcts in the left frontal lobe by CT and MRI  Other Active Problems  Microcytic anemia - iron 38, Ferritin 115 - put on iron tab bid. Hb 11.7 ->11.9->12.9 (stable; can monitor monthly)  Hospital day # 69   , MD PhD Stroke Neurology 06/22/2019 11:16 AM   To contact Stroke Continuity provider, please refer to 78. After hours, contact General Neurology

## 2019-06-23 DIAGNOSIS — E119 Type 2 diabetes mellitus without complications: Secondary | ICD-10-CM

## 2019-06-23 DIAGNOSIS — E785 Hyperlipidemia, unspecified: Secondary | ICD-10-CM | POA: Diagnosis present

## 2019-06-23 DIAGNOSIS — I69391 Dysphagia following cerebral infarction: Secondary | ICD-10-CM

## 2019-06-23 DIAGNOSIS — R569 Unspecified convulsions: Secondary | ICD-10-CM

## 2019-06-23 LAB — GLUCOSE, CAPILLARY
Glucose-Capillary: 220 mg/dL — ABNORMAL HIGH (ref 70–99)
Glucose-Capillary: 260 mg/dL — ABNORMAL HIGH (ref 70–99)
Glucose-Capillary: 251 mg/dL — ABNORMAL HIGH (ref 70–99)
Glucose-Capillary: 266 mg/dL — ABNORMAL HIGH (ref 70–99)

## 2019-06-23 MED ORDER — PRO-STAT SUGAR FREE PO LIQD: 30.0000 mL | Freq: Three times a day (TID) | ORAL | 0 refills | Status: AC

## 2019-06-23 MED ORDER — ASPIRIN 81 MG PO TBEC: 81.0000 mg | DELAYED_RELEASE_TABLET | Freq: Every day | ORAL | 2 refills | Status: AC

## 2019-06-23 MED ORDER — BENAZEPRIL HCL 10 MG PO TABS: 10.0000 mg | ORAL_TABLET | Freq: Two times a day (BID) | ORAL | 2 refills | Status: AC

## 2019-06-23 MED ORDER — ATORVASTATIN CALCIUM 20 MG PO TABS: 20.0000 mg | ORAL_TABLET | Freq: Every day | ORAL | 2 refills | Status: AC

## 2019-06-23 MED ORDER — BETHANECHOL CHLORIDE 25 MG PO TABS: 25.0000 mg | ORAL_TABLET | Freq: Three times a day (TID) | ORAL | 0 refills | Status: AC

## 2019-06-23 MED ORDER — METFORMIN HCL 1000 MG PO TABS: 1000.0000 mg | ORAL_TABLET | Freq: Two times a day (BID) | ORAL | 0 refills | Status: AC

## 2019-06-23 MED ORDER — BUPROPION HCL ER (XL) 150 MG PO TB24: 150.0000 mg | ORAL_TABLET | Freq: Every day | ORAL | 0 refills | Status: AC

## 2019-06-23 MED ORDER — MIRTAZAPINE 15 MG PO TABS: 15.0000 mg | ORAL_TABLET | Freq: Every day | ORAL | 0 refills | Status: AC

## 2019-06-23 MED ORDER — ADULT MULTIVITAMIN W/MINERALS CH: 1.0000 | ORAL_TABLET | Freq: Every day | ORAL | 0 refills | Status: AC

## 2019-06-23 MED FILL — metFORMIN HCL 1000 MG TABS: 1000 | 30 days supply | Qty: 60 | Fill #0

## 2019-06-23 MED FILL — BENAZEPRIL HCL 10 MG TABLET: 10 | 30 days supply | Qty: 60 | Fill #0

## 2019-06-23 MED FILL — ATORVASTATIN CALCIUM 20 MG: 20 | 30 days supply | Qty: 30 | Fill #0

## 2019-06-23 MED FILL — buPROPion HCL ER (XL) 150 M: 150 | 30 days supply | Qty: 30 | Fill #0

## 2019-06-23 MED FILL — BETHANECHOL 25 MG TABLET: 25 | 30 days supply | Qty: 90 | Fill #0

## 2019-06-23 MED FILL — MIRTAZAPINE 15 MG TABLET: 15 | 30 days supply | Qty: 30 | Fill #0

## 2019-06-23 MED FILL — ASPIRIN 81MG ADULT LOW STRE: 81 | 30 days supply | Qty: 30 | Fill #0

## 2019-06-23 NOTE — Progress Notes (Signed)
PT Cancellation Note  Patient Details Name: Rodney Collier MRN: [id number] DOB: 02-14-1966   Cancelled Treatment:    Reason Eval/Treat Not Completed: Patient declined, no reason specified.  Pt seemed in better spirits today, especially when I spoke with him about his family and plans to come get him and take him to [place].  Despite smiling, he continued to refused EOB or OOB mobility.  Even became angry as I tried to convince him to sit EOB to eat.  I left, repositioned the bed in lowest mode with all rails up.  He is full supervision to eat, so I did not put the food tray in front of him.  Bed alarm engaged.    Thanks,  Verdene Lennert, PT, DPT  Acute Rehabilitation [phone] pager [phone] office  @ [facility]: [phone]     [name] 06/23/2019, 1:21 PM

## 2019-06-23 NOTE — Plan of Care (Signed)
Pt continues to refuse medications and treatments.  He is eating well and his mood is less emotional today.

## 2019-06-23 NOTE — Progress Notes (Signed)
Upon shift change, during bedside report, patient was grimacing and grabbing right leg.  Translator provided and patient stated that leg had been hurting since this morning but pt did not tell anyone.  Patient was repositioned and refused medication but did allow RN to place heat pack on it. There was no redness, warmth, or swelling noted on assessment.  Patient was repositioned and heat pack in place.  RN went back into re-access pain, pt stated no pain currently but during assessment when RN pushed down on inside of right leg pt grimaced and withdrew leg.  RN placed on X on leg to identify spot and continued to assess pt.  Pt was verbalizing no pain to translator but physically grimacing when area was being assessed.  Will continue to monitor and Neuro was notified.

## 2019-06-23 NOTE — Progress Notes (Signed)
STROKE TEAM PROGRESS NOTE   INTERVAL HISTORY Pt lying in bed, awake alert, no neuro changes, continues to refuse medication.    Vitals:   06/22/19 1921 06/22/19 2341 06/23/19 0320 06/23/19 0749  BP: 113/86 131/88 113/77 122/83  Pulse: 85 84 87 (!) 102  Resp: 18 18 18 18   Temp: 98.1 F (36.7 C) 98.3 F (36.8 C) 98.4 F (36.9 C) 98.5 F (36.9 C)  TempSrc: Oral Axillary Oral Oral  SpO2: 99% 100% 100% 99%  Weight:      Height:        CBC:  Recent Labs  Lab 06/18/19 0459  WBC 5.1  HGB 12.9*  HCT 41.2  MCV 72.4*  PLT 357    Basic Metabolic Panel:  Recent Labs  Lab 06/18/19 0459  NA 137  K 4.4  CL 101  CO2 18*  GLUCOSE 239*  BUN 14  CREATININE 0.87  CALCIUM 10.1    IMAGING last 24h No results found.   PHYSICAL EXAM  Temp:  [98.1 F (36.7 C)-98.9 F (37.2 C)] 98.5 F (36.9 C) (11/05 0749) Pulse Rate:  [79-102] 102 (11/05 0749) Resp:  [16-18] 18 (11/05 0749) BP: (113-131)/(77-92) 122/83 (11/05 0749) SpO2:  [99 %-100 %] 99 % (11/05 0749)         General - Well nourished, well developed, depressed mood with intermittent crying.  Ophthalmologic - fundi not visualized due to noncooperation.  Cardiovascular - Regular rhythm and rate.  Neuro - awake, alert, eyes open, following simple commands. Able to have sentences in his native language.  Incomplete left gaze bilaterally, right gaze complete, up and downward gaze palsy. PERRL. Not blinking to visual threat on the right. Rt hemianopia. Right facial droop, tongue midline, LUE 5/5 but significant ataxia. LLE 4/5 at least. RLE 1/5 but no movement of RUE. Diminished tone on the right LE but increased muscle tone at RUE. Positive triple reflex on the right. Sensation decreased on the left, and gait not tested.  ASSESSMENT/PLAN [name] with no significant past medical history presenting with right-sided weakness, decreased sensation on the right, diplopia and headache. Complete  revascularization of occluded L PCA w/ mechanical thrombectomy.   Stroke: embolic multifocal posterior circulation infarcts with left P2 occlusion s/p IR with TICI3 reperfusion - likely embolic   Code Stroke CT head No acute abnormality. Old infarct L frontal lobe.   CTA head & neck L P2 occlusion. Mild neck atherosclerosis . No LVO anterior circulation. Mild mid BA narrowing.  CT perfusion ischemia w/o visible core  CT Head - 04/16/19 - Areas of acute infarction are showing low-density and swelling but no macroscopic hemorrhage by CT.    Cerebral angio TICI3 reperfusion of occluded L PCA  MRI  L PCA (including thalamus), L>R cerebellum, R pontine and left midbrain infarct. Petechial hemorrhage seen in pons. Old L frontal infarcts.   LE Doppler no DVT - no evidence of deep vein thrombosis   2D Echo - EF 55-60%.  No cardiac source of emboli identified.   TCD bubble no HITS  TEE neg  UDS - positive for benzos - UDS done on 9/1  EEG - cortical dysfunction but no seizures.  LDL 88  HgbA1c 12.3  Lovenox 40 mg sq daily  for VTE prophylaxis  No antithrombotic prior to admission, finished DAPT 3-week course, now on aspirin alone - refusing now  Therapy recommendations:  CIR -> SNF-> home w/ family in [place]  Disposition:  pending (  hoping for d/c with brother and sister in law [place] later this week, meds sent to [facility] to [facility] for planned d/c)  Medically ready for d/c when d/c plan confirmed   Depression  Patient developed depressed mood and intermittent crying  Psychiatry on board  Switch BuSpar to Wellbutrin  Add mirtazapine  Pt continues to refuse meds  Possible seizure  Full body rigid shaking hours post IR, L>R  Loaded with keppra  EEG - cortical dysfunction but no seizures.  Tapered off keppra 10/23  Seizure precautions  Hypertension  No hx of HTN, on no home meds  Placed On cleviprex gtt for BP control post IR, now off . Discontinue amlodipine 10 . Now  on lotensin to 10mg  bid  . BP stable . BP goal normotensive  Hyperlipidemia  Home meds:  No statin  LDL 88, goal < 70  HDL 39  On lipitor 20 - pt refusing meds  Continue statin at discharge  Diabetes type II, new diagnosis, Uncontrolled Hyperglycemia   HgbA1c 12.3, goal < 7.0  continue metformin 1000mg  bid   off glipizde 5 mg bid d/t hypoglycemia  glipizide 2.5mg  QAC -> 2.5mg  bid -> hypoglycemia 10/6 -> back to 2.5 mg qac ->glucose lowish 10/18 -> glipizide discontinued  SSI 0-15 scale, 0-5 qhs  Agreed to take insulin past 24h  hyperglycemia - due to pt refusing meds  Dysphagia Malnutrition  . Secondary to stroke . Speech on board . Encourage po intake . Off IVF, lab stable . On glucerna shakes tid . Now cleared for D3 thin liquids in cup only, meds whole w/ liquids in cup  UTI with fever, resolved  UA showed WBC > 50  Urine culture enterobactor and staph coag neg  Sensitive to cipro  Finished cipro 500mg  bid course (ended 9/8)  Fever resolved   Other Stroke Risk Factors   hx of stroke - small remote appearing infarcts in the left frontal lobe by CT and MRI  Other Active Problems  Microcytic anemia - iron 38, Ferritin 115 - put on iron tab bid. Hb 11.7 ->11.9->12.9 (stable; can monitor monthly)  Hospital day # 59   , MD PhD Stroke Neurology 06/23/2019 8:16 AM   To contact Stroke Continuity provider, please refer to 66. After hours, contact General Neurology

## 2019-06-24 LAB — GLUCOSE, CAPILLARY
Glucose-Capillary: 232 mg/dL — ABNORMAL HIGH (ref 70–99)
Glucose-Capillary: 257 mg/dL — ABNORMAL HIGH (ref 70–99)
Glucose-Capillary: 323 mg/dL — ABNORMAL HIGH (ref 70–99)

## 2019-06-24 NOTE — Progress Notes (Signed)
Patient refusing to take medication. Nurse educated patient on importance of medication but patient still refused. MD notified.

## 2019-06-24 NOTE — Progress Notes (Signed)
Physical Therapy Treatment Patient Details Name: Rodney Collier MRN: [id number] DOB: [birth year] Today's Date: 06/24/2019    History of Present Illness Pt is a 53 y.o. M with no known PMH who presents with right sided weakness, decreased sensation on right, diplopia and headache. CT showing remote L frontal infarcts, CTA head/neck with L P2 occlusion, MRI with acute L PCA infarct including much of L thalamus, L > R cerebellum, R pons. Now s/p complete revascularization of occluded L PCA with mechanical thrombectomy. ETT 8/27-8/28; self extubated 8/28.    PT Comments    Goals updated based on progress.  Limited session with pt today who continues to refuse OOB mobility.  He was agreeable to repositioning in bed and therapist assisted in supervising his meal consumption (he is full supervision and needs frequent safety cues and assistance--see below).  Pt crying as therapist said good bye in case the plan for his family to come and take him back to [place] happens this weekend.  PT will continue to follow acutely for safe mobility progression.   Follow Up Recommendations  SNF     Equipment Recommendations  3in1 (PT);Wheelchair (measurements PT);Wheelchair cushion (measurements PT);Other (comment)(18x18 hemi height one arm drive WC)    Recommendations for Other Services       Precautions / Restrictions Precautions Precautions: Fall Precaution Comments: R hemiparesis and inattention.    Mobility  Bed Mobility Overal bed mobility: Needs Assistance             General bed mobility comments: Bed positioned flat and pt able to 1/2 bridge and use his left arm and leg to move up towards the [facility].  Cues for multiple scoots.   Transfers                 General transfer comment: Pt not agreeable to OOB mobility.             Cognition Arousal/Alertness: Awake/alert Behavior During Therapy: Flat affect Overall Cognitive Status: Impaired/Different from baseline Area of Impairment:  Safety/judgement;Awareness;Problem solving                       Following Commands: Follows multi-step commands inconsistently Safety/Judgement: Decreased awareness of safety;Decreased awareness of deficits Awareness: Emergent Problem Solving: Difficulty sequencing;Requires verbal cues;Requires tactile cues General Comments: Pt continuing to refuse OOB mobility, agreeable to in bed repositioning and assistance with his lunch.          General Comments General comments (skin integrity, edema, etc.): PT assisted pt in donning his glasses to help with double vision so he can succesfully eat and find things on his plate.  He needs full supervision as he eats quickly, impulsively and without reguards to education re: safe swallow precautions.  PT encouraged pt to use his utensils vs "drinking" his soup and fruit from the cup and using his fingers to eat fish and mac and cheese.  Frequent cues to check his right cheek for food and manual assist (pressing on the outside of his R cheek) to help clear food from this side.  Cues also needed to  take smaller sips of milk as he was taking large gulps, spillage out of the R side of his mouth and ultimate wet cough with big gulps.  Pt left positioned in chair mode in bed with alarm engaged at end of session.        Pertinent Vitals/Pain Pain Assessment: No/denies pain           PT  Goals (current goals can now be found in the care plan section) Acute Rehab PT Goals Patient Stated Goal: none stated PT Goal Formulation: Patient unable to participate in goal setting Time For Goal Achievement: 07/08/19 Potential to Achieve Goals: Fair Progress towards PT goals: Not progressing toward goals - comment(self limiting)    Frequency    Min 3X/week      PT Plan Current plan remains appropriate       AM-PAC PT "6 Clicks" Mobility   Outcome Measure  Help needed turning from your back to your side while in a flat bed without using bedrails?:  A Little Help needed moving from lying on your back to sitting on the side of a flat bed without using bedrails?: A Little Help needed moving to and from a bed to a chair (including a wheelchair)?: A Lot Help needed standing up from a chair using your arms (e.g., wheelchair or bedside chair)?: A Lot Help needed to walk in hospital room?: Total Help needed climbing 3-5 steps with a railing? : Total 6 Click Score: 12    End of Session   Activity Tolerance: Patient tolerated treatment well Patient left: in bed;with call bell/phone within reach;with bed alarm set   PT Visit Diagnosis: Unsteadiness on feet (R26.81);Other symptoms and signs involving the nervous system (R29.898);Pain Hemiplegia - Right/Left: Right Hemiplegia - dominant/non-dominant: Non-dominant Hemiplegia - caused by: Cerebral infarction Pain - Right/Left: Right Pain - part of body: Leg     Time: 1150-1210 PT Time Calculation (min) (ACUTE ONLY): 20 min  Charges:  $Therapeutic Activity: 8-22 mins                    Verdene Lennert, PT, DPT  Acute Rehabilitation [phone] pager #[phone] office  @ [facility]: [phone]   06/24/2019, 3:10 PM

## 2019-06-24 NOTE — Progress Notes (Addendum)
STROKE TEAM PROGRESS NOTE   INTERVAL HISTORY Pt lying in bed, awake alert, no neuro changes, continues to refuse medication. Intermittently agitated with nursing staff even when helping him reposition, moving food trays. Refuse to be touched. Brother and sister in law coming today from [place] to pick him up and back to [place] tomorrow.  Vitals:   06/24/19 0005 06/24/19 0350 06/24/19 0818 06/24/19 1231  BP: (!) 130/92 (!) 119/93 129/79 (!) 129/107  Pulse: 79 73 94 87  Resp: 18 18 19 19   Temp: 97.6 F (36.4 C) (!) 97.5 F (36.4 C) 98.1 F (36.7 C) 98 F (36.7 C)  TempSrc: Oral Oral Oral Oral  SpO2: 100% 100% 100% 100%  Weight:      Height:        CBC:  Recent Labs  Lab 06/18/19 0459  WBC 5.1  HGB 12.9*  HCT 41.2  MCV 72.4*  PLT 469    Basic Metabolic Panel:  Recent Labs  Lab 06/18/19 0459  NA 137  K 4.4  CL 101  CO2 18*  GLUCOSE 239*  BUN 14  CREATININE 0.87  CALCIUM 10.1    IMAGING last 24h No results found.   PHYSICAL EXAM  Temp:  [97.5 F (36.4 C)-98.6 F (37 C)] 98 F (36.7 C) (11/06 1231) Pulse Rate:  [73-96] 87 (11/06 1231) Resp:  [18-19] 19 (11/06 1231) BP: (105-130)/(73-107) 129/107 (11/06 1231) SpO2:  [100 %] 100 % (11/06 1231)         General - Well nourished, well developed, depressed mood with intermittent crying.  Ophthalmologic - fundi not visualized due to noncooperation.  Cardiovascular - Regular rhythm and rate.  Neuro - awake, alert, eyes open, following simple commands. Able to have sentences in his native language.  Incomplete left gaze bilaterally, right gaze complete, up and downward gaze palsy. PERRL. Not blinking to visual threat on the right. Rt hemianopia. Right facial droop, tongue midline, LUE 5/5 but significant ataxia. LLE 4/5 at least. RLE 1/5 but no movement of RUE. Diminished tone on the right LE but increased muscle tone at RUE. Positive triple reflex on the right. Sensation decreased on the left, and gait not  tested.  ASSESSMENT/PLAN Rodney Collier is a 53 y.o. male with no significant past medical history presenting with right-sided weakness, decreased sensation on the right, diplopia and headache. Complete revascularization of occluded L PCA w/ mechanical thrombectomy.   Stroke: embolic multifocal posterior circulation infarcts with left P2 occlusion s/p IR with TICI3 reperfusion - likely embolic   Code Stroke CT head No acute abnormality. Old infarct L frontal lobe.   CTA head & neck L P2 occlusion. Mild neck atherosclerosis . No LVO anterior circulation. Mild mid BA narrowing.  CT perfusion ischemia w/o visible core  CT Head - 04/16/19 - Areas of acute infarction are showing low-density and swelling but no macroscopic hemorrhage by CT.    Cerebral angio TICI3 reperfusion of occluded L PCA  MRI  L PCA (including thalamus), L>R cerebellum, R pontine and left midbrain infarct. Petechial hemorrhage seen in pons. Old L frontal infarcts.   LE Doppler no DVT - no evidence of deep vein thrombosis   2D Echo - EF 55-60%.  No cardiac source of emboli identified.   TCD bubble no HITS  TEE neg  UDS - positive for benzos - UDS done on 9/1  EEG - cortical dysfunction but no seizures.  LDL 88  HgbA1c 12.3  Lovenox 40 mg sq daily  for  VTE prophylaxis  No antithrombotic prior to admission, finished DAPT 3-week course, now on aspirin alone - refusing now  Therapy recommendations:  CIR -> SNF-> home w/ family in [place] on Saturday as planned  Disposition:  pending (hoping for d/c with brother and sister in law [place] this week, meds sent to [facility] to fill for planned d/c on Thursday, d/c summary updated)  Medically ready for d/c when family shows up  Depression  Patient developed depressed mood and intermittent crying  Psychiatry on board  Switch BuSpar to Wellbutrin  Add mirtazapine  Pt continues to refuse meds  Possible seizure  Full body rigid shaking hours post IR, L>R  Loaded  with keppra  EEG - cortical dysfunction but no seizures.  Tapered off keppra 10/23  Seizure precautions  Hypertension  No hx of HTN, on no home meds  Placed On cleviprex gtt for BP control post IR, now off . Discontinue amlodipine 10 . Now on lotensin to 10mg  bid  . BP stable . BP goal normotensive  Hyperlipidemia  Home meds:  No statin  LDL 88, goal < 70  HDL 39  On lipitor 20 - pt refusing meds  Continue statin at discharge  Diabetes type II, new diagnosis, Uncontrolled Hyperglycemia   HgbA1c 12.3, goal < 7.0  continue metformin 1000mg  bid   off glipizde 5 mg bid d/t hypoglycemia  glipizide 2.5mg  QAC -> 2.5mg  bid -> hypoglycemia 10/6 -> back to 2.5 mg qac ->glucose lowish 10/18 -> glipizide discontinued  SSI 0-15 scale, 0-5 qhs  Agreed to take insulin past 24h  hyperglycemia - due to pt refusing meds  Dysphagia Malnutrition  . Secondary to stroke . Speech on board . Encourage po intake . Off IVF, lab stable . On glucerna shakes tid . Now cleared for D3 thin liquids in cup only, meds whole w/ liquids in cup  UTI with fever, resolved  UA showed WBC > 50  Urine culture enterobactor and staph coag neg  Sensitive to cipro  Finished cipro 500mg  bid course (ended 9/8)  Fever resolved   Other Stroke Risk Factors   hx of stroke - small remote appearing infarcts in the left frontal lobe by CT and MRI  Other Active Problems  Microcytic anemia - iron 38, Ferritin 115 - put on iron tab bid. Hb 11.7 ->11.9->12.9 (stable; can monitor monthly)  Intermittent agitation and combative toward nursing staff - deescalation and monitor   Hospital day # 68   , MD PhD Stroke Neurology 06/24/2019 1:02 PM   To contact Stroke Continuity provider, please refer to 62. After hours, contact General Neurology

## 2019-06-25 LAB — GLUCOSE, CAPILLARY
Glucose-Capillary: 225 mg/dL — ABNORMAL HIGH (ref 70–99)
Glucose-Capillary: 290 mg/dL — ABNORMAL HIGH (ref 70–99)

## 2019-06-25 NOTE — TOC Progression Note (Signed)
Transition of Care [facility]) - Progression Note    Patient Details  Name: Rodney Collier MRN: [id number] Date of Birth: July 29, 1966  Transition of Care [facility]) CM/SW [place], [place] Phone Number: 06/25/2019, 8:57 AM  Clinical Narrative:   CSW following for discharge plan. CSW collaborated with [place] to procure charity wheelchair for patient, obtained and brought to unit for patient to have tomorrow. Patient's medications were obtained from [facility] previously, they are in the patient's closet. Family will arrive at 10 am tomorrow to pick up patient, and two members of the family will need to come up for visitation to learn how to assist with mobility and providing patient his medications. CSW to alert RN staff tomorrow about visitation exception for family education at discharge.    Expected Discharge Plan: [place] Barriers to Discharge: SNF Pending bed offer, Homeless with medical needs, Other (comment)(needs placement in [place])  Expected Discharge Plan and Services Expected Discharge Plan: [place] Choice: [place]                                         Social Determinants of Health (SDOH) Interventions    Readmission Risk Interventions No flowsheet data found.

## 2019-06-25 NOTE — Progress Notes (Signed)
Pt daughter Rodney Collier present at bedside for discharge instructions and teaching. AVS reviewed, and questions answered. Pt discharged from facility via wheelchair with belongings including cellphone and tablet, glasses, and medications. Questions regarding diet and mobility answered for pt brother [name] at time of discharge. Wheelchair loaded into the patients car with other belongings.

## 2019-06-25 NOTE — Plan of Care (Signed)
Patient continues to refuse all medications.

## 2019-06-27 LAB — GLUCOSE, CAPILLARY: Glucose-Capillary: 173 mg/dL — ABNORMAL HIGH (ref 70–99)

## 2019-09-16 ENCOUNTER — Other Ambulatory Visit: Payer: Self-pay

## 2019-09-16 NOTE — Patient Outreach (Signed)
Telephone outreach to patient's caregiver to obtain mRS was successfully completed. MRS=5   [facility]

## 2020-03-20 IMAGING — DX PORTABLE CHEST - 1 VIEW
1 series · 1 of 1 positions shown · non-contrast
Comparison: Radiographs April 14, 2019.

CLINICAL DATA: Stroke.  Endotracheal tube placement.

EXAM:
PORTABLE CHEST 1 VIEW

[chest ap]
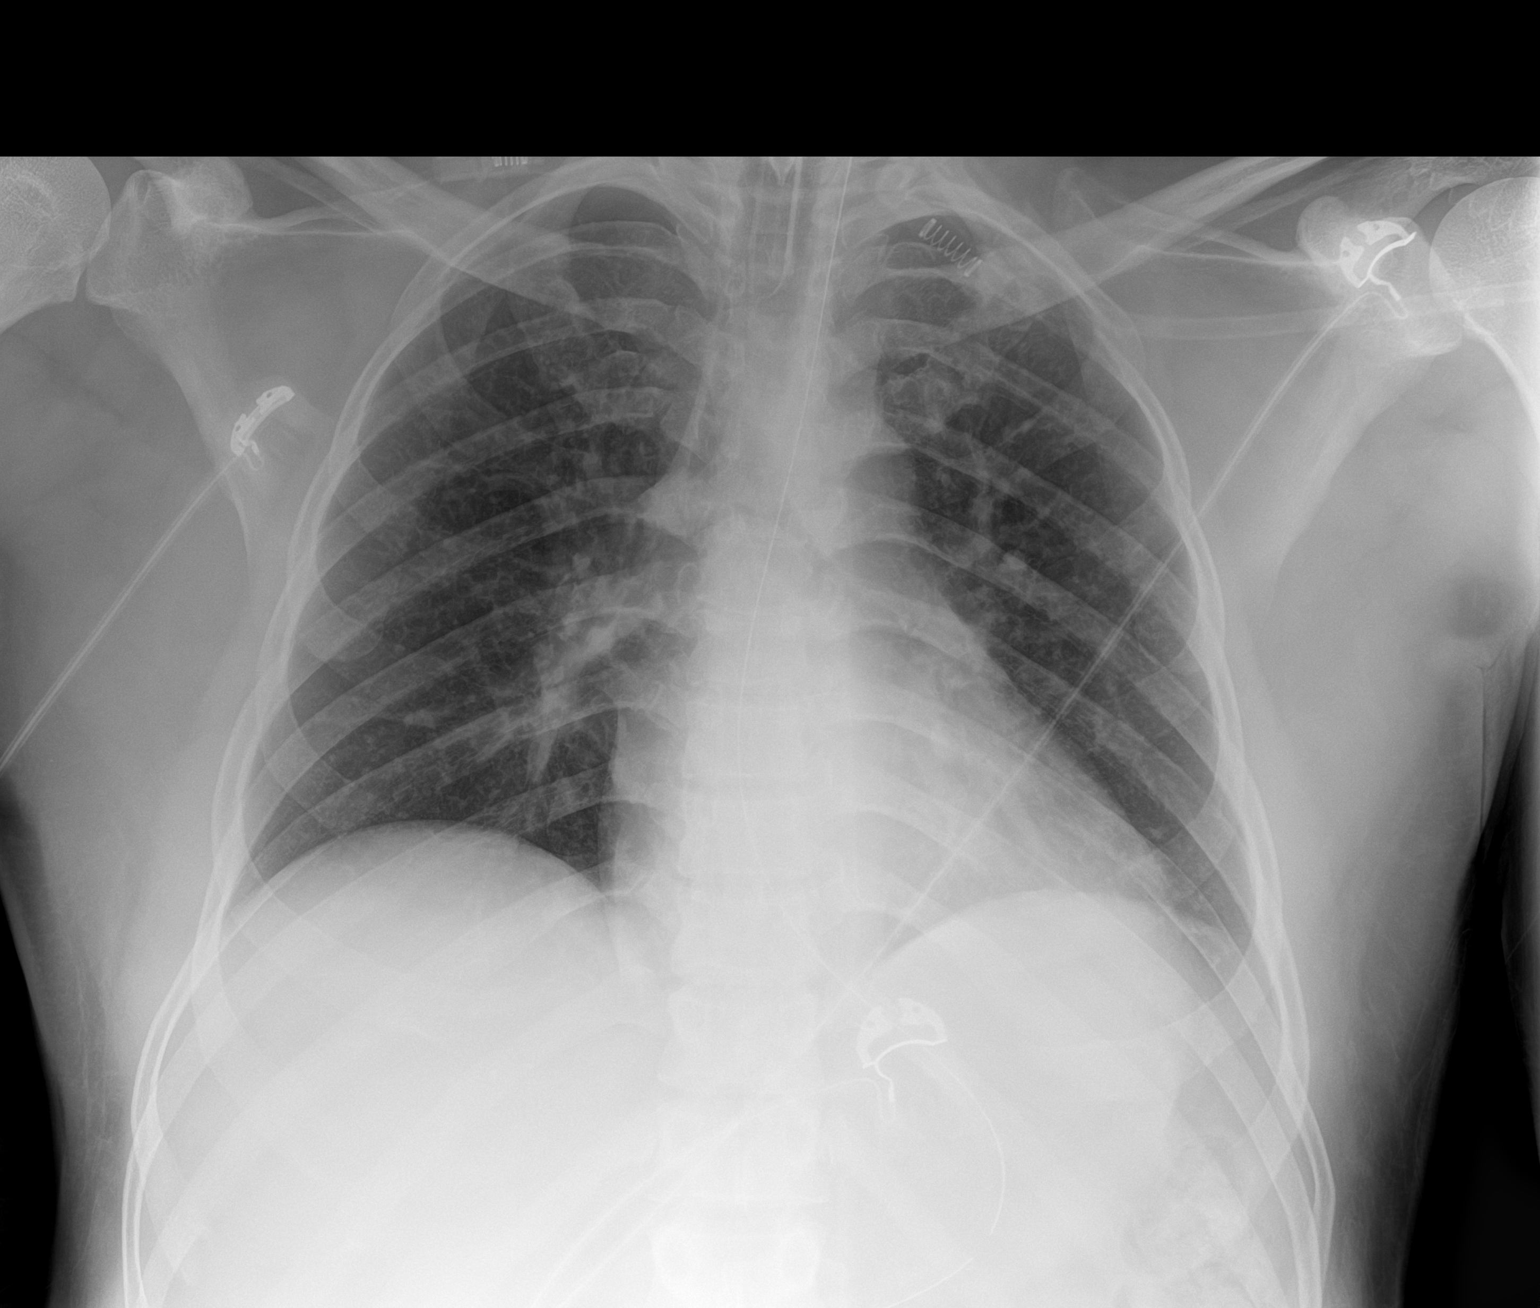

[1 of 1 positions shown; findings below may reference images not displayed]

FINDINGS: The heart size and mediastinal contours are within normal limits.
Endotracheal and nasogastric tubes are in grossly good position. No
pneumothorax or pleural effusion is noted. Both lungs are clear. The
visualized skeletal structures are unremarkable.
IMPRESSION: Endotracheal and nasogastric tubes in good position. No acute
cardiopulmonary abnormality seen.

## 2020-03-25 IMAGING — DX DG CHEST 1V PORT
1 series · 1 of 1 positions shown · non-contrast
Comparison: 04/15/2019

CLINICAL DATA: Febrile

EXAM:
PORTABLE CHEST 1 VIEW

[chest ap]
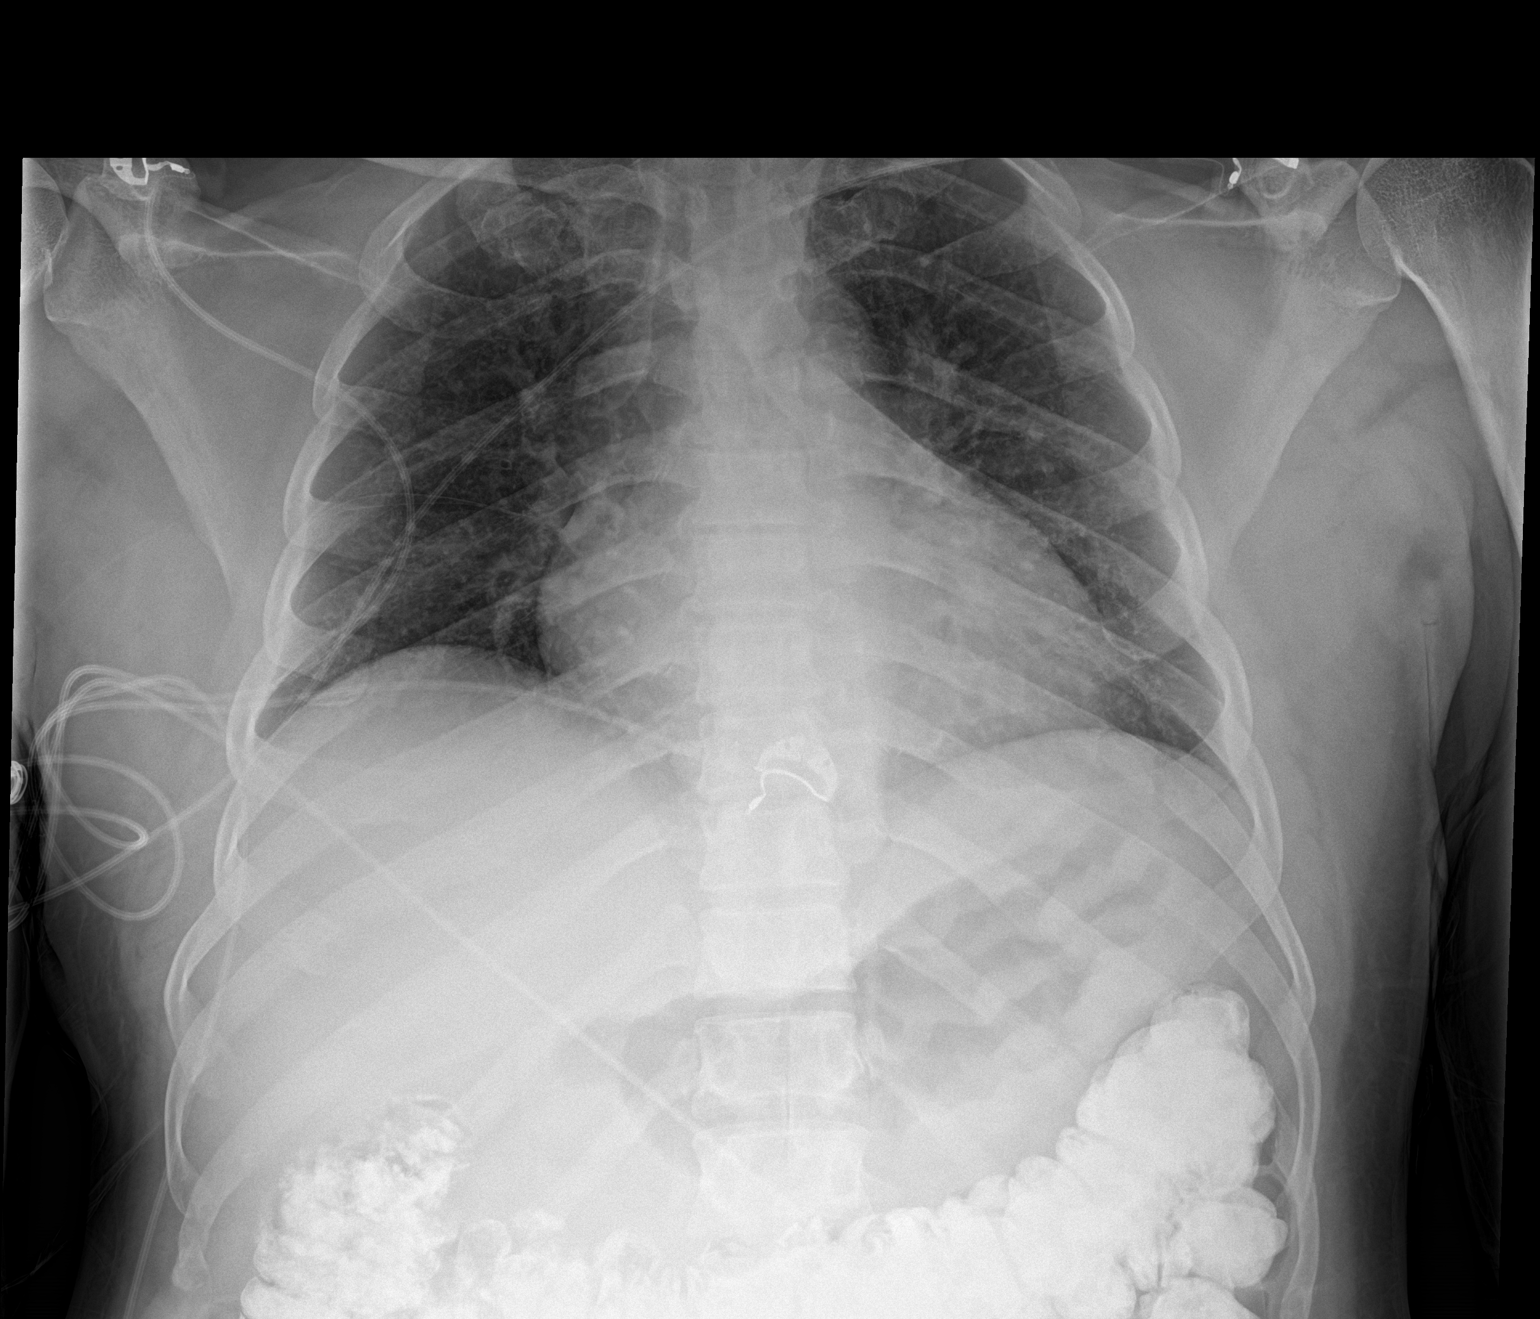

[1 of 1 positions shown; findings below may reference images not displayed]

FINDINGS: Cardiomegaly. There has been interval removal of endotracheal and
esophagogastric tubes. Both lungs are clear. The visualized skeletal
structures are unremarkable.
IMPRESSION: Interval removal of endotracheal and esophagogastric tubes.
Cardiomegaly without acute abnormality of the lungs.

## 2024-02-12 ENCOUNTER — Encounter (HOSPITAL_COMMUNITY): Payer: Self-pay | Admitting: Interventional Radiology
